# Patient Record
Sex: Female | Born: 1950 | Race: White | Hispanic: No | Smoking: Current every day smoker
Health system: Southern US, Community
[De-identification: ages and names within clinical notes are randomized; demographics above are authoritative.]

## PROBLEM LIST (undated history)

## (undated) DIAGNOSIS — F329 Major depressive disorder, single episode, unspecified: Secondary | ICD-10-CM

## (undated) DIAGNOSIS — M1990 Unspecified osteoarthritis, unspecified site: Secondary | ICD-10-CM

## (undated) DIAGNOSIS — E039 Hypothyroidism, unspecified: Secondary | ICD-10-CM

## (undated) DIAGNOSIS — R011 Cardiac murmur, unspecified: Secondary | ICD-10-CM

## (undated) DIAGNOSIS — E785 Hyperlipidemia, unspecified: Secondary | ICD-10-CM

## (undated) DIAGNOSIS — F32A Depression, unspecified: Secondary | ICD-10-CM

## (undated) DIAGNOSIS — E079 Disorder of thyroid, unspecified: Secondary | ICD-10-CM

## (undated) DIAGNOSIS — C4491 Basal cell carcinoma of skin, unspecified: Secondary | ICD-10-CM

## (undated) DIAGNOSIS — F419 Anxiety disorder, unspecified: Secondary | ICD-10-CM

## (undated) DIAGNOSIS — G709 Myoneural disorder, unspecified: Secondary | ICD-10-CM

## (undated) DIAGNOSIS — J449 Chronic obstructive pulmonary disease, unspecified: Secondary | ICD-10-CM

## (undated) DIAGNOSIS — C4492 Squamous cell carcinoma of skin, unspecified: Secondary | ICD-10-CM

## (undated) DIAGNOSIS — T7840XA Allergy, unspecified, initial encounter: Secondary | ICD-10-CM

## (undated) DIAGNOSIS — D039 Melanoma in situ, unspecified: Secondary | ICD-10-CM

## (undated) DIAGNOSIS — C539 Malignant neoplasm of cervix uteri, unspecified: Secondary | ICD-10-CM

## (undated) DIAGNOSIS — C439 Malignant melanoma of skin, unspecified: Secondary | ICD-10-CM

## (undated) HISTORY — PX: SHOULDER SURGERY: SHX246

## (undated) HISTORY — PX: CERVICAL CONIZATION W/BX: SHX1330

## (undated) HISTORY — DX: Basal cell carcinoma of skin, unspecified: C44.91

## (undated) HISTORY — PX: THUMB ARTHROSCOPY: SHX2509

## (undated) HISTORY — DX: Melanoma in situ, unspecified: D03.9

## (undated) HISTORY — DX: Allergy, unspecified, initial encounter: T78.40XA

## (undated) HISTORY — DX: Chronic obstructive pulmonary disease, unspecified: J44.9

## (undated) HISTORY — PX: EYE SURGERY: SHX253

## (undated) HISTORY — DX: Malignant melanoma of skin, unspecified: C43.9

## (undated) HISTORY — DX: Malignant neoplasm of cervix uteri, unspecified: C53.9

## (undated) HISTORY — DX: Depression, unspecified: F32.A

## (undated) HISTORY — DX: Squamous cell carcinoma of skin, unspecified: C44.92

## (undated) HISTORY — DX: Disorder of thyroid, unspecified: E07.9

## (undated) HISTORY — DX: Hyperlipidemia, unspecified: E78.5

## (undated) HISTORY — DX: Major depressive disorder, single episode, unspecified: F32.9

---

## 1997-09-13 ENCOUNTER — Other Ambulatory Visit: Admission: RE | Admit: 1997-09-13 | Discharge: 1997-09-13 | Payer: Self-pay | Admitting: Obstetrics and Gynecology

## 1998-04-06 HISTORY — PX: ANKLE SURGERY: SHX546

## 1999-05-28 ENCOUNTER — Encounter: Admission: RE | Admit: 1999-05-28 | Discharge: 1999-06-13 | Payer: Self-pay | Admitting: Podiatry

## 2000-02-12 ENCOUNTER — Encounter: Payer: Self-pay | Admitting: Obstetrics and Gynecology

## 2000-02-12 ENCOUNTER — Encounter: Admission: RE | Admit: 2000-02-12 | Discharge: 2000-02-12 | Payer: Self-pay | Admitting: Obstetrics and Gynecology

## 2001-02-17 ENCOUNTER — Encounter: Payer: Self-pay | Admitting: Obstetrics and Gynecology

## 2001-02-17 ENCOUNTER — Encounter: Admission: RE | Admit: 2001-02-17 | Discharge: 2001-02-17 | Payer: Self-pay | Admitting: Obstetrics and Gynecology

## 2003-02-19 ENCOUNTER — Encounter: Admission: RE | Admit: 2003-02-19 | Discharge: 2003-02-19 | Payer: Self-pay | Admitting: Obstetrics and Gynecology

## 2004-02-20 ENCOUNTER — Ambulatory Visit (HOSPITAL_COMMUNITY): Admission: RE | Admit: 2004-02-20 | Discharge: 2004-02-20 | Payer: Self-pay | Admitting: Obstetrics and Gynecology

## 2006-10-28 ENCOUNTER — Ambulatory Visit: Payer: Self-pay | Admitting: Internal Medicine

## 2006-10-28 LAB — CONVERTED CEMR LAB
Basophils Absolute: 0.1 10*3/uL (ref 0.0–0.1)
Basophils Relative: 0.9 % (ref 0.0–1.0)
Eosinophils Absolute: 0.1 10*3/uL (ref 0.0–0.6)
Eosinophils Relative: 1.8 % (ref 0.0–5.0)
HCT: 38.4 % (ref 36.0–46.0)
Hemoglobin: 13.6 g/dL (ref 12.0–15.0)
Lymphocytes Relative: 24.6 % (ref 12.0–46.0)
MCHC: 35.5 g/dL (ref 30.0–36.0)
MCV: 88.6 fL (ref 78.0–100.0)
Monocytes Absolute: 0.6 10*3/uL (ref 0.2–0.7)
Monocytes Relative: 6.9 % (ref 3.0–11.0)
Neutro Abs: 5.4 10*3/uL (ref 1.4–7.7)
Neutrophils Relative %: 65.8 % (ref 43.0–77.0)
Platelets: 219 10*3/uL (ref 150–400)
RBC: 4.34 M/uL (ref 3.87–5.11)
RDW: 12.4 % (ref 11.5–14.6)
Sed Rate: 7 mm/hr (ref 0–25)
WBC: 8.2 10*3/uL (ref 4.5–10.5)

## 2008-12-13 DIAGNOSIS — C4492 Squamous cell carcinoma of skin, unspecified: Secondary | ICD-10-CM

## 2008-12-13 HISTORY — DX: Squamous cell carcinoma of skin, unspecified: C44.92

## 2010-08-19 NOTE — Assessment & Plan Note (Signed)
[facility]                             PULMONARY OFFICE NOTE   RANIE, CHINCHILLA                     MRN:          [id number]  DATE:10/28/2006                            DOB:          1951/04/03    REASON FOR CONSULTATION:  Pulmonary nodules.   HISTORY:  This is a 60 year old white female with transient right sided  pleuritic pain lasting since a month ago that has now completely  resolved, but led to a chest x-ray which led to a CT scan suggesting  that she had interstitial lung disease with a few pulmonary nodules as  well. She is now seen at Dr. Kathi Der request for evaluation.   The patient says that she is 100% better now and denies any ongoing  dyspnea (in fact, she can work out aerobically 30 to 45 minutes 3 to 5  times per week). There is no recurrent chest pain, pleuritic,  exertional, or otherwise. There is no cough, fevers, chills, sweats,  dyspnea, or unintended weight loss. She does complain of some mild aches  and pains in her major joints, but no typical morning stiffness or gel  phenomena, or history of rheumatoidism to her knowledge.   PAST MEDICAL HISTORY:  Significant for a heart murmur, cervical cancer  approximately 20 years ago and a partial hysterectomy then. She did not  require any adjuvant therapy.   ALLERGIES:  None known.   MEDICATIONS:  Vitamins.   SOCIAL HISTORY:  She continues to smoke a pack per day. She works  Clinical cytogeneticist a store. She has worked in [employer] and [employer]  with inhaling a bunch of dust, but there is no history of any coal,  sandblasting, or asbestos exposure to her knowledge.   FAMILY HISTORY:  Significant in that her sister and mother who are both  smokers have chronic obstructive pulmonary disease.   REVIEW OF SYSTEMS:  Taken in detail in the worksheet and negative except  as outlined above.   PHYSICAL EXAMINATION:  GENERAL:  This is a healthy appearing ambulatory  middle aged  white female in no acute distress.  HEENT:  Unremarkable. She did have mildly hyperpigmented skin typical of  excess sun exposure, but no excess hyperpigmentation in non-sun exposed  area. Oropharynx was clear. She had full dentures in place. Nasal  turbinates normal with no crusting or discharge.  NECK:  Supple without cervical adenopathy or tenderness.  LUNGS:  Fields reveal a few pops and squeaks on inspiration with minimal  rhonchi on expiration.  HEART:  Regular rate and rhythm with no murmurs, rubs, or gallops.  ABDOMEN:  Soft and benign with negative Hoover's sign.  EXTREMITIES:  Warm without calf tenderness, cyanosis, clubbing, or  edema.   Chest x-ray from 09/10/2006 did not have any prior comparison studies  available, but there was mild diffuse interstitial change, which on CT  scan dated 10/13/2006 was nodular bilateral with no honey combing or  significant interstitial lung disease or granulomatous changes, although  there was a partially calcified plural plaque in the left upper lobe.   IMPRESSION:  1. Pleuritic chest pain  of unclear etiology has totally resolved and      probably was either musculoskeletal or related to a pneumonia that      resolved.  2. She has an abnormal exam with pops and squeaks typical of chronic      bronchitis/chronic obstructive pulmonary disease, but minimum      symptoms.  3. She has bilateral nodular interstitial lung disease which may      represent an early form of cigarette induced disease such as      eosinophilic granulomatosis  or respiratory bronchiolitis      interstitial disease. An outside possibility is also that she is      developing MAI, but notice the absence of cough rules against this.   I am surprised that her exercise tolerance is so well preserved and I  suspect that she will have significant airflow obstruction on PFTs. I  have asked her to return for a set of PFTs and at that point, we will  review her plain film,  obtain another for baseline purposes, and then  line her up for serial follow up.   I had a very frank discussion with this patient when she asked, what  about the spots on my lungs. I do not believe that the spots on the  lungs are any more significant to her than a leaky faucet would have  been on the Titanic should she continue to smoke. In other words, she  needs to face the fact that the main issue facing her is whether she is  going to change course now to avoid significant long term morbidity or  mortality from smoking, or stop smoking now and literally let the  smoke clear, so that we can get a better idea of long lung function  and also address the issue of the nodules, which may be in fact,  directly related to smoking or present alternative diagnoses none of  which come to mind in terms the need for a earlier or immediate  pulmonary intervention such as biopsy in this case.   I know that she has already heard this message from Dr. Christell Constant and  apparently she has a prescription for Chantix, but has not yet decided  to fill it. I have strongly encouraged her to do so.     Charlaine Dalton. Sherene Sires, MD, [facility]  Electronically Signed    MBW/MedQ  DD: 10/28/2006  DT: 10/29/2006  Job #: [id number]   cc:   Ernestina Penna, M.D.

## 2011-08-06 ENCOUNTER — Other Ambulatory Visit: Payer: Self-pay

## 2012-04-06 HISTORY — PX: COLONOSCOPY: SHX174

## 2013-10-09 ENCOUNTER — Ambulatory Visit (INDEPENDENT_AMBULATORY_CARE_PROVIDER_SITE_OTHER): Payer: BC Managed Care – PPO | Admitting: Obstetrics & Gynecology

## 2013-10-09 ENCOUNTER — Encounter: Payer: Self-pay | Admitting: Obstetrics & Gynecology

## 2013-10-09 VITALS — BP 141/86 | HR 70 | Resp 16 | Ht 65.0 in | Wt 130.0 lb

## 2013-10-09 DIAGNOSIS — Z124 Encounter for screening for malignant neoplasm of cervix: Secondary | ICD-10-CM

## 2013-10-09 DIAGNOSIS — Z01419 Encounter for gynecological examination (general) (routine) without abnormal findings: Secondary | ICD-10-CM

## 2013-10-09 DIAGNOSIS — Z780 Asymptomatic menopausal state: Secondary | ICD-10-CM

## 2013-10-09 DIAGNOSIS — Z87410 Personal history of cervical dysplasia: Secondary | ICD-10-CM

## 2013-10-09 DIAGNOSIS — Z1151 Encounter for screening for human papillomavirus (HPV): Secondary | ICD-10-CM

## 2013-10-09 NOTE — Progress Notes (Signed)
Patient ID: Carolyn [name], female   DOB: April 05, 1951, 63 y.o.   MRN: [id number]  Chief Complaint  Patient presents with  . Gynecologic Exam    HPI Carolyn [name] is a 63 y.o. female.  [id number] No LMP recorded. Patient is postmenopausal. Last pap was 1 year ago, normal. Has mammogram appt.  HPI  Past Medical History  Diagnosis Date  . Cervical cancer   . Thyroid disease     Past Surgical History  Procedure Laterality Date  . Cervical conization w/bx    1989  Family History  Problem Relation Age of Onset  . Heart disease Mother   . Aneurysm Mother   . Heart disease Father   . Kidney failure Father     Social History History  Substance Use Topics  . Smoking status: Current Every Day Smoker  . Smokeless tobacco: Never Used  . Alcohol Use: No    Allergies  Allergen Reactions  . Azithromycin Rash    Current Outpatient Prescriptions  Medication Sig Dispense Refill  . Calcium-Magnesium-Zinc 167-83-8 MG TABS Take 2 tablets by mouth daily.      . clonazePAM (KLONOPIN) 0.5 MG tablet       . levothyroxine (SYNTHROID, LEVOTHROID) 50 MCG tablet Take 50 mcg by mouth daily before breakfast.      . Multiple Vitamins-Minerals (CENTRUM SILVER ADULT 50+ PO) Take 1 tablet by mouth daily.      . Omega-3 Fatty Acids (FISH OIL) 1200 MG CAPS Take 2 capsules by mouth daily.      . potassium gluconate 595 MG TABS tablet Take 595 mg by mouth 2 (two) times daily.      . prenatal vitamin w/FE, FA (PRENATAL 1 + 1) 27-1 MG TABS tablet Take 1 tablet by mouth daily at 12 noon.       No current facility-administered medications for this visit.    Review of Systems Review of Systems  Constitutional: Negative.   Respiratory: Negative.   Genitourinary: Positive for urgency. Negative for vaginal bleeding, vaginal discharge and pelvic pain.    Blood pressure 141/86, pulse 70, resp. rate 16, height 5\' 5"  (1.651 m), weight 130 lb (58.968 kg).  Physical Exam Physical Exam  Constitutional:  She is oriented to person, place, and time. She appears well-developed. No distress.  Cardiovascular: Normal rate.   Pulmonary/Chest: Effort normal. She has wheezes (scattered).  Breasts: breasts appear normal, no suspicious masses, no skin or nipple changes or axillary nodes.   Abdominal: Soft. She exhibits no distension. There is no tenderness.  Genitourinary: Vagina normal and uterus normal. No vaginal discharge found.  Pelvic exam: normal external genitalia, vulva, vagina, cervix, uterus and adnexa pap done.   Neurological: She is alert and oriented to person, place, and time.  Skin: Skin is warm and dry.  Whole body tan, dry skin, significant aging  Psychiatric: She has a normal mood and affect. Her behavior is normal.    Data Reviewed Meds, notes  Assessment    Postmenopausal, h/o cx dysplasia, tanning, smoker     Plan    Quit smoking, tanning, have colonoscopy, RTC 1 year        Chauncy Mangiaracina 10/09/2013, 3:03 PM

## 2013-10-12 LAB — CYTOLOGY - PAP

## 2013-10-23 ENCOUNTER — Telehealth: Payer: Self-pay

## 2013-10-23 NOTE — Telephone Encounter (Signed)
Colpo scheduled for 11/06/13 at 1445. Attempted to call patient to inform of results. No answer. Phone continued to ring-- unable to leave message.

## 2013-10-23 NOTE — Telephone Encounter (Signed)
Message copied by Geanie Logan on Mon Oct 23, 2013  8:20 AM ------      Message from: Woodroe Mode      Created: Fri Oct 20, 2013  2:51 PM       Please schedule colposcopy ------

## 2013-10-24 NOTE — Telephone Encounter (Signed)
Called patient and informed her of results as well as colposcopy date and time. Patient reports she wants to put it off as her mom just passed away. Informed patient that it is not urgent, however, it is something that should be done, especially given her history in the past. Patient verbalized understanding and stated she will keep appointment. Informed patient that though her pap was in [place] will be done here at [facility] hospital clinic-- gave her address as well as clinic number should she need to reschedule. Patient verbalized understanding and gratitude. No further questions or concerns.

## 2013-11-06 ENCOUNTER — Other Ambulatory Visit (HOSPITAL_COMMUNITY)
Admission: RE | Admit: 2013-11-06 | Discharge: 2013-11-06 | Disposition: A | Discharge status: Home / Self Care | Payer: BC Managed Care – PPO | Source: Ambulatory Visit | Attending: Obstetrics & Gynecology | Admitting: Obstetrics & Gynecology

## 2013-11-06 ENCOUNTER — Encounter: Payer: Self-pay | Admitting: Obstetrics & Gynecology

## 2013-11-06 ENCOUNTER — Ambulatory Visit (INDEPENDENT_AMBULATORY_CARE_PROVIDER_SITE_OTHER): Payer: BC Managed Care – PPO | Admitting: Obstetrics & Gynecology

## 2013-11-06 VITALS — BP 133/73 | HR 78 | Temp 97.7°F | Ht 66.0 in | Wt 126.9 lb

## 2013-11-06 DIAGNOSIS — R87619 Unspecified abnormal cytological findings in specimens from cervix uteri: Secondary | ICD-10-CM | POA: Insufficient documentation

## 2013-11-06 DIAGNOSIS — N942 Vaginismus: Secondary | ICD-10-CM | POA: Diagnosis not present

## 2013-11-06 LAB — POCT PREGNANCY, URINE: Preg Test, Ur: NEGATIVE

## 2013-11-06 NOTE — Progress Notes (Signed)
  Colposcopy Procedure Note  Indications: Pap smear 1 months ago showed: glandular cell abnormality (AGUS). The prior pap showed no abnormalities (per patient).  Prior cervical/vaginal disease: Patient has history of cervical cone (?CKC vs LEEP) by Dr. Ulanda Edison when patient was 63 yo.  Attempting to get records. Pt denies any post menopausal bleeding.  Procedure Details  The risks and benefits of the procedure and Written informed consent obtained.  Speculum placed in vagina and excellent visualization of cervix achieved, cervix swabbed x 3 with acetic acid solution.  Findings: Cervix: no visible lesions; cervix swabbed with  Acetic acid and subsequently Lugol's solution, endocervical speculum placed and endocervical curettage performed.  There was no visible lesion and the Pennock junction could not be seen with the endocervical speculum. Vaginal inspection: vaginal colposcopy not performed. Vulvar colposcopy: vulvar colposcopy not performed.  ENDOMETRIAL BIOPSY     The indications for endometrial biopsy were reviewed.   Risks of the biopsy including cramping, bleeding, infection, uterine perforation, inadequate specimen and need for additional procedures  were discussed. The patient states she understands and agrees to undergo procedure today. Consent was signed. Time out was performed. Urine HCG was negative. A sterile speculum was placed in the patient's vagina and the cervix was prepped with Betadine. A single-toothed tenaculum was placed on the anterior lip of the cervix to stabilize it. The 3 mm pipelle was introduced into the endometrial cavity without difficulty to a depth of 7cm, and a moderate amount of tissue was obtained and sent to pathology. The instruments were removed from the patient's vagina. Minimal bleeding from the cervix was noted. The patient tolerated the procedure well. Routine post-procedure instructions were given to the patient. The patient will follow up to review the results  and for further management.     Specimens: ECC and endometrial biopsy.  Complications: none.  Plan: Specimens labelled and sent to Pathology. Will base further treatment on Pathology findings. Post biopsy instructions given to patient. Return to discuss Pathology results in 2 weeks.

## 2013-11-10 ENCOUNTER — Telehealth: Payer: Self-pay

## 2013-11-10 ENCOUNTER — Encounter: Payer: Self-pay | Admitting: *Deleted

## 2013-11-10 ENCOUNTER — Encounter: Payer: Self-pay | Admitting: Obstetrics & Gynecology

## 2013-11-10 NOTE — Telephone Encounter (Signed)
Message copied by Geanie Logan on Fri Nov 10, 2013 10:42 AM ------      Message from: Guss Bunde      Created: Fri Nov 10, 2013 10:31 AM       Inadequate colpo.  No dysplasia seen on exam and ECC is benign.  Endometrial biopsy negative.  Pt needs excisional procedure and is scheduled for 11/29/13 ------

## 2013-11-10 NOTE — Telephone Encounter (Signed)
Inadequate colposcopy. Hx of AGUS. Needs LEEP. Appointment 11/29/13. Called patient and informed of results and the need to have LEEP. Patient questions reason for LEEP given negative ECC and endometrial biopsy. Explained the ECC is tissue within the cervix, the endometrial biopsy is endometrial tissue lining the uterus and that the colpo biopsies are tissue on the outside of the cervix. Explained the importance of getting adequate tissue there to sample as you want to ensure all areas are accounted for. Informed patient that they will discuss her results and perform LEEP with her consent at next appointment on 11/29/13. Patient verbalized understanding. No further questions or concerns.

## 2013-11-29 ENCOUNTER — Ambulatory Visit (INDEPENDENT_AMBULATORY_CARE_PROVIDER_SITE_OTHER): Payer: BC Managed Care – PPO | Admitting: Obstetrics and Gynecology

## 2013-11-29 ENCOUNTER — Encounter: Payer: Self-pay | Admitting: Obstetrics and Gynecology

## 2013-11-29 ENCOUNTER — Other Ambulatory Visit (HOSPITAL_COMMUNITY)
Admission: RE | Admit: 2013-11-29 | Discharge: 2013-11-29 | Disposition: A | Discharge status: Home / Self Care | Payer: BC Managed Care – PPO | Source: Ambulatory Visit | Attending: Obstetrics and Gynecology | Admitting: Obstetrics and Gynecology

## 2013-11-29 VITALS — BP 145/78 | HR 67 | Temp 97.5°F | Ht 66.0 in | Wt 128.4 lb

## 2013-11-29 DIAGNOSIS — R87619 Unspecified abnormal cytological findings in specimens from cervix uteri: Secondary | ICD-10-CM | POA: Insufficient documentation

## 2013-11-29 NOTE — Addendum Note (Signed)
Addended by: Rutherford Nail E on: 11/29/2013 02:32 PM   Modules accepted: Orders

## 2013-11-29 NOTE — Progress Notes (Signed)
Patient ID: Carolyn [name], female   DOB: 1950-10-17, 63 y.o.   MRN: [id number] Patient identified, informed consent obtained, signed copy in chart, time out performed.  Pap smear and colposcopy reviewed.   Pap AGUS +HPV Colpo Biopsy- n/a ECC benign but inadequent sample Endometrial biopsy- benign for hyperplasia or malignancy Teflon coated speculum with smoke evacuator placed.  Cervix visualized. Paracervical block placed.  medium size LOOP used to remove cone of cervix using blend of cut and cautery on LEEP machine.  Edges/Base cauterized with Ball.  Monsel's solution used for hemostasis.  Patient tolerated procedure well.  Patient given post procedure instructions.  Follow up in 6 months for repeat pap or as needed.

## 2013-12-01 ENCOUNTER — Encounter: Payer: Self-pay | Admitting: General Practice

## 2013-12-19 ENCOUNTER — Telehealth: Payer: Self-pay

## 2013-12-19 NOTE — Telephone Encounter (Signed)
Message copied by Geanie Logan on Tue Dec 19, 2013 10:32 AM ------      Message from: Mora Bellman      Created: Tue Dec 19, 2013  9:13 AM       Please inform patient that Leep procedure appears to be curative. Please ensure patient knows to return in 6 months for repeat pap smear ------

## 2013-12-19 NOTE — Telephone Encounter (Signed)
Called patient and informed her of LEEP results and recommendations. Advised that she call in January to schedule repeat 6 month pap for sometime in February- beginning of March. Patient verbalized understanding and gratitude. No questions or concerns.

## 2014-02-05 ENCOUNTER — Encounter: Payer: Self-pay | Admitting: Obstetrics and Gynecology

## 2014-07-05 ENCOUNTER — Other Ambulatory Visit: Payer: Self-pay | Admitting: Dermatology

## 2014-07-05 DIAGNOSIS — D039 Melanoma in situ, unspecified: Secondary | ICD-10-CM

## 2014-07-05 HISTORY — DX: Melanoma in situ, unspecified: D03.9

## 2014-07-20 ENCOUNTER — Ambulatory Visit (INDEPENDENT_AMBULATORY_CARE_PROVIDER_SITE_OTHER): Payer: BLUE CROSS/BLUE SHIELD | Admitting: Obstetrics and Gynecology

## 2014-07-20 ENCOUNTER — Encounter: Payer: Self-pay | Admitting: Obstetrics and Gynecology

## 2014-07-20 VITALS — BP 142/81 | HR 64 | Temp 97.6°F | Ht 64.0 in | Wt 139.5 lb

## 2014-07-20 DIAGNOSIS — Z124 Encounter for screening for malignant neoplasm of cervix: Secondary | ICD-10-CM | POA: Diagnosis not present

## 2014-07-20 DIAGNOSIS — R87619 Unspecified abnormal cytological findings in specimens from cervix uteri: Secondary | ICD-10-CM

## 2014-07-20 DIAGNOSIS — Z1151 Encounter for screening for human papillomavirus (HPV): Secondary | ICD-10-CM | POA: Diagnosis not present

## 2014-07-20 NOTE — Progress Notes (Signed)
Patient ID: Carolyn [name], female   DOB: 1951-01-11, 64 y.o.   MRN: [id number] 64 yo with h/o AGUS pap followed by a LEEP in 11/2013 who is here for follow up pap smear. Patient is without complaints.   Pap smear collected  Patient will be contacted with any abnormal results and appropriate management plan

## 2014-07-23 LAB — CYTOLOGY - PAP

## 2014-07-27 ENCOUNTER — Telehealth: Payer: Self-pay

## 2014-07-27 NOTE — Telephone Encounter (Signed)
-----   Message from Mora Bellman, MD sent at 07/27/2014  7:28 AM EDT ----- Please inform patient of abnormal pap smear and need for colpo  Thanks  Peggy

## 2014-07-27 NOTE — Telephone Encounter (Signed)
Called pt and LM to return the call to the clinics.

## 2014-07-30 NOTE — Telephone Encounter (Signed)
Patient returned our call. I called patient back and left voicemail. Message sent to front desk to schedule colposcopy.

## 2014-07-30 NOTE — Telephone Encounter (Signed)
Called patient and informed her of need for colpo. Advised her that when appointment is made we will call her to inform her of the appointment date and time. Patient had no further questions.

## 2014-08-05 HISTORY — PX: MELANOMA EXCISION: SHX5266

## 2014-08-08 ENCOUNTER — Encounter: Payer: BLUE CROSS/BLUE SHIELD | Admitting: Obstetrics & Gynecology

## 2014-08-15 ENCOUNTER — Other Ambulatory Visit (HOSPITAL_COMMUNITY)
Admission: RE | Admit: 2014-08-15 | Discharge: 2014-08-15 | Disposition: A | Discharge status: Home / Self Care | Payer: BLUE CROSS/BLUE SHIELD | Source: Ambulatory Visit | Attending: Obstetrics & Gynecology | Admitting: Obstetrics & Gynecology

## 2014-08-15 ENCOUNTER — Ambulatory Visit (INDEPENDENT_AMBULATORY_CARE_PROVIDER_SITE_OTHER): Payer: BLUE CROSS/BLUE SHIELD | Admitting: Obstetrics and Gynecology

## 2014-08-15 ENCOUNTER — Encounter: Payer: Self-pay | Admitting: Obstetrics and Gynecology

## 2014-08-15 VITALS — BP 141/73 | HR 65 | Temp 98.2°F | Ht 64.0 in | Wt 137.8 lb

## 2014-08-15 DIAGNOSIS — R87619 Unspecified abnormal cytological findings in specimens from cervix uteri: Secondary | ICD-10-CM | POA: Insufficient documentation

## 2014-08-15 DIAGNOSIS — R87612 Low grade squamous intraepithelial lesion on cytologic smear of cervix (LGSIL): Secondary | ICD-10-CM | POA: Diagnosis not present

## 2014-08-15 NOTE — Progress Notes (Signed)
Patient ID: Carolyn [name], female   DOB: 1950-11-23, 64 y.o.   MRN: [id number]  Patient s/p LEEP 11/2013 with 6 month follow up pap smear LSIL here for colposcopy  Patient given informed consent, signed copy in the chart, time out was performed.  Placed in lithotomy position. Cervix viewed with speculum and colposcope after application of acetic acid.   Colposcopy adequate?  No, TZ not visualized. Very atrophic vagina and cervix. Cervix almost flush with vaginal vault Acetowhite lesions?faint at 7 o'clock Punctation?no Mosaicism?  no Abnormal vasculature?  no Biopsies?yes 7o'clock ECC?yes  COMMENTS: Patient was given post procedure instructions.  She will return in 2 weeks for results.  Mora Bellman, MD

## 2014-08-17 ENCOUNTER — Telehealth: Payer: Self-pay

## 2014-08-17 NOTE — Telephone Encounter (Signed)
-----   Message from Mora Bellman, MD sent at 08/17/2014  8:32 AM EDT ----- Please inform patient that she should follow up in 6 months for repeat pap smear  Thanks  Peggy   ----- Message -----    From: Osborne Oman, MD    Sent: 08/16/2014   5:30 PM      To: Mora Bellman, MD    ----- Message -----    From: [facility]    Sent: 08/16/2014   4:51 PM      To: Osborne Oman, MD

## 2014-08-17 NOTE — Telephone Encounter (Signed)
Called patient and informed her of results and recommendations. Patient verbalized understanding and gratitude and asked how much longer she will bleed-- asked patient if she was still bleeding-- patient states she is spotting with brownish discharge. Informed patient this was normal and due to the solution placed on cervix after biopsy. Informed her discharge to be expected for a few more days. Patient verbalized understanding and gratitude. No further questions or concerns.

## 2014-09-13 ENCOUNTER — Other Ambulatory Visit: Payer: Self-pay | Admitting: Dermatology

## 2014-10-16 LAB — HM DEXA SCAN

## 2014-11-01 ENCOUNTER — Other Ambulatory Visit: Payer: Self-pay | Admitting: Dermatology

## 2014-11-29 ENCOUNTER — Telehealth: Payer: Self-pay | Admitting: General Practice

## 2014-11-29 NOTE — Telephone Encounter (Signed)
Patient called and left message stating she is returning our call. Called patient back stating I am returning her call. Told patient I am unsure who tried to contact her as I do not see a telephone call from one of our nurses listed in the system. Told patient if there wasn't a message left with a direct number the person that needs her will probably call her back. Patient verbalized understanding and had no other questions. After phone call, spoke with Beronica in front office who states she called the patient yesterday. beronica will return phone call

## 2015-01-11 ENCOUNTER — Encounter: Payer: Self-pay | Admitting: Obstetrics and Gynecology

## 2015-01-11 ENCOUNTER — Ambulatory Visit (INDEPENDENT_AMBULATORY_CARE_PROVIDER_SITE_OTHER): Payer: BLUE CROSS/BLUE SHIELD | Admitting: Obstetrics and Gynecology

## 2015-01-11 VITALS — BP 131/69 | HR 74 | Temp 98.2°F | Ht 64.0 in | Wt 143.4 lb

## 2015-01-11 DIAGNOSIS — Z1151 Encounter for screening for human papillomavirus (HPV): Secondary | ICD-10-CM

## 2015-01-11 DIAGNOSIS — R87612 Low grade squamous intraepithelial lesion on cytologic smear of cervix (LGSIL): Secondary | ICD-10-CM | POA: Diagnosis not present

## 2015-01-11 DIAGNOSIS — Z124 Encounter for screening for malignant neoplasm of cervix: Secondary | ICD-10-CM | POA: Diagnosis not present

## 2015-01-11 NOTE — Progress Notes (Signed)
Patient ID: Carolyn [name], female   DOB: 07-Aug-1950, 64 y.o.   MRN: [id number] 64 yo with h/o abnormal pap smear here for 6- month follow up  08/15/2014 colpo CIN I  Repeat pap smear performed today  Patient will be contacted with any abnormal results

## 2015-01-14 LAB — CYTOLOGY - PAP

## 2015-01-15 ENCOUNTER — Telehealth: Payer: Self-pay | Admitting: *Deleted

## 2015-01-15 NOTE — Telephone Encounter (Signed)
Called patient per Dr Elly Modena re: abnormal PAP, need for colposcopy. No answer, voice mail left to call the clinic.

## 2015-01-15 NOTE — Telephone Encounter (Signed)
Contacted patient, results given.  Pt states she cannot do colposcopy at this time, is having shoulder surgery. Will schedule 8 wks out.  Message to front office.

## 2015-03-04 ENCOUNTER — Ambulatory Visit (INDEPENDENT_AMBULATORY_CARE_PROVIDER_SITE_OTHER): Payer: BLUE CROSS/BLUE SHIELD | Admitting: Obstetrics and Gynecology

## 2015-03-04 ENCOUNTER — Other Ambulatory Visit (HOSPITAL_COMMUNITY)
Admission: RE | Admit: 2015-03-04 | Discharge: 2015-03-04 | Disposition: A | Discharge status: Home / Self Care | Payer: BLUE CROSS/BLUE SHIELD | Source: Ambulatory Visit | Attending: Obstetrics and Gynecology | Admitting: Obstetrics and Gynecology

## 2015-03-04 ENCOUNTER — Encounter: Payer: Self-pay | Admitting: Obstetrics and Gynecology

## 2015-03-04 VITALS — BP 120/54 | HR 74 | Ht 64.0 in | Wt 146.4 lb

## 2015-03-04 DIAGNOSIS — R896 Abnormal cytological findings in specimens from other organs, systems and tissues: Secondary | ICD-10-CM

## 2015-03-04 DIAGNOSIS — R87619 Unspecified abnormal cytological findings in specimens from cervix uteri: Secondary | ICD-10-CM | POA: Diagnosis present

## 2015-03-04 DIAGNOSIS — IMO0002 Reserved for concepts with insufficient information to code with codable children: Secondary | ICD-10-CM | POA: Insufficient documentation

## 2015-03-04 LAB — POCT PREGNANCY, URINE: Preg Test, Ur: NEGATIVE

## 2015-03-04 NOTE — Progress Notes (Signed)
Patient ID: Carolyn [name], female   DOB: [birth year], 64 y.o.   MRN: [id number] ASCUS +HPV in 01/2015 Patient given informed consent, signed copy in the chart, time out was performed.  Placed in lithotomy position. Cervix viewed with speculum and colposcope after application of acetic acid.   Colposcopy adequate?  No TZ not visualized Acetowhite lesions?yes 7 o'clock Punctation?no Mosaicism?  no Abnormal vasculature?  no Biopsies?yes 7 o'clock ECC?yes  COMMENTS: Patient was given post procedure instructions.  She will return in 2 weeks for results.  Mora Bellman, MD

## 2015-03-11 ENCOUNTER — Encounter: Payer: Self-pay | Admitting: *Deleted

## 2015-07-11 ENCOUNTER — Ambulatory Visit (INDEPENDENT_AMBULATORY_CARE_PROVIDER_SITE_OTHER): Payer: Medicare Other | Admitting: Family

## 2015-07-11 ENCOUNTER — Encounter: Payer: Self-pay | Admitting: Family

## 2015-07-11 VITALS — BP 125/84 | HR 66 | Temp 97.1°F | Ht 64.0 in | Wt 142.8 lb

## 2015-07-11 DIAGNOSIS — F411 Generalized anxiety disorder: Secondary | ICD-10-CM

## 2015-07-11 DIAGNOSIS — Z1159 Encounter for screening for other viral diseases: Secondary | ICD-10-CM | POA: Diagnosis not present

## 2015-07-11 DIAGNOSIS — E039 Hypothyroidism, unspecified: Secondary | ICD-10-CM | POA: Diagnosis not present

## 2015-07-11 DIAGNOSIS — J309 Allergic rhinitis, unspecified: Secondary | ICD-10-CM | POA: Insufficient documentation

## 2015-07-11 DIAGNOSIS — F329 Major depressive disorder, single episode, unspecified: Secondary | ICD-10-CM | POA: Diagnosis not present

## 2015-07-11 DIAGNOSIS — Z114 Encounter for screening for human immunodeficiency virus [HIV]: Secondary | ICD-10-CM

## 2015-07-11 DIAGNOSIS — Z1211 Encounter for screening for malignant neoplasm of colon: Secondary | ICD-10-CM

## 2015-07-11 DIAGNOSIS — G2581 Restless legs syndrome: Secondary | ICD-10-CM | POA: Diagnosis not present

## 2015-07-11 DIAGNOSIS — F32A Depression, unspecified: Secondary | ICD-10-CM

## 2015-07-11 DIAGNOSIS — F3341 Major depressive disorder, recurrent, in partial remission: Secondary | ICD-10-CM | POA: Insufficient documentation

## 2015-07-11 MED ORDER — CITALOPRAM HYDROBROMIDE 20 MG PO TABS: 20.0000 mg | ORAL_TABLET | Freq: Every day | ORAL | Status: DC

## 2015-07-11 MED ORDER — CLONAZEPAM 0.5 MG PO TABS: 0.5000 mg | ORAL_TABLET | Freq: Every day | ORAL | Status: DC

## 2015-07-11 NOTE — Patient Instructions (Signed)
Health Maintenance, Female Adopting a healthy lifestyle and getting preventive care can go a long way to promote health and wellness. Talk with your health care provider about what schedule of regular examinations is right for you. This is a good chance for you to check in with your provider about disease prevention and staying healthy. In between checkups, there are plenty of things you can do on your own. Experts have done a lot of research about which lifestyle changes and preventive measures are most likely to keep you healthy. Ask your health care provider for more information. WEIGHT AND DIET  Eat a healthy diet  Be sure to include plenty of vegetables, fruits, low-fat dairy products, and lean protein.  Do not eat a lot of foods high in solid fats, added sugars, or salt.  Get regular exercise. This is one of the most important things you can do for your health.  Most adults should exercise for at least 150 minutes each week. The exercise should increase your heart rate and make you sweat (moderate-intensity exercise).  Most adults should also do strengthening exercises at least twice a week. This is in addition to the moderate-intensity exercise.  Maintain a healthy weight  Body mass index (BMI) is a measurement that can be used to identify possible weight problems. It estimates body fat based on height and weight. Your health care provider can help determine your BMI and help you achieve or maintain a healthy weight.  For females 20 years of age and older:   A BMI below 18.5 is considered underweight.  A BMI of 18.5 to 24.9 is normal.  A BMI of 25 to 29.9 is considered overweight.  A BMI of 30 and above is considered obese.  Watch levels of cholesterol and blood lipids  You should start having your blood tested for lipids and cholesterol at 63 years of age, then have this test every 5 years.  You may need to have your cholesterol levels checked more often if:  Your lipid  or cholesterol levels are high.  You are older than 63 years of age.  You are at high risk for heart disease.  CANCER SCREENING   Lung Cancer  Lung cancer screening is recommended for adults 55-80 years old who are at high risk for lung cancer because of a history of smoking.  A yearly low-dose CT scan of the lungs is recommended for people who:  Currently smoke.  Have quit within the past 15 years.  Have at least a 30-pack-year history of smoking. A pack year is smoking an average of one pack of cigarettes a day for 1 year.  Yearly screening should continue until it has been 15 years since you quit.  Yearly screening should stop if you develop a health problem that would prevent you from having lung cancer treatment.  Breast Cancer  Practice breast self-awareness. This means understanding how your breasts normally appear and feel.  It also means doing regular breast self-exams. Let your health care provider know about any changes, no matter how small.  If you are in your 20s or 30s, you should have a clinical breast exam (CBE) by a health care provider every 1-3 years as part of a regular health exam.  If you are 40 or older, have a CBE every year. Also consider having a breast X-ray (mammogram) every year.  If you have a family history of breast cancer, talk to your health care provider about genetic screening.  If you   are at high risk for breast cancer, talk to your health care provider about having an MRI and a mammogram every year.  Breast cancer gene (BRCA) assessment is recommended for women who have family members with BRCA-related cancers. BRCA-related cancers include:  Breast.  Ovarian.  Tubal.  Peritoneal cancers.  Results of the assessment will determine the need for genetic counseling and BRCA1 and BRCA2 testing. Cervical Cancer Your health care provider may recommend that you be screened regularly for cancer of the pelvic organs (ovaries, uterus, and  vagina). This screening involves a pelvic examination, including checking for microscopic changes to the surface of your cervix (Pap test). You may be encouraged to have this screening done every 3 years, beginning at age 21.  For women ages 30-65, health care providers may recommend pelvic exams and Pap testing every 3 years, or they may recommend the Pap and pelvic exam, combined with testing for human papilloma virus (HPV), every 5 years. Some types of HPV increase your risk of cervical cancer. Testing for HPV may also be done on women of any age with unclear Pap test results.  Other health care providers may not recommend any screening for nonpregnant women who are considered low risk for pelvic cancer and who do not have symptoms. Ask your health care provider if a screening pelvic exam is right for you.  If you have had past treatment for cervical cancer or a condition that could lead to cancer, you need Pap tests and screening for cancer for at least 20 years after your treatment. If Pap tests have been discontinued, your risk factors (such as having a new sexual partner) need to be reassessed to determine if screening should resume. Some women have medical problems that increase the chance of getting cervical cancer. In these cases, your health care provider may recommend more frequent screening and Pap tests. Colorectal Cancer  This type of cancer can be detected and often prevented.  Routine colorectal cancer screening usually begins at 63 years of age and continues through 63 years of age.  Your health care provider may recommend screening at an earlier age if you have risk factors for colon cancer.  Your health care provider may also recommend using home test kits to check for hidden blood in the stool.  A small camera at the end of a tube can be used to examine your colon directly (sigmoidoscopy or colonoscopy). This is done to check for the earliest forms of colorectal  cancer.  Routine screening usually begins at age 50.  Direct examination of the colon should be repeated every 5-10 years through 63 years of age. However, you may need to be screened more often if early forms of precancerous polyps or small growths are found. Skin Cancer  Check your skin from head to toe regularly.  Tell your health care provider about any new moles or changes in moles, especially if there is a change in a mole's shape or color.  Also tell your health care provider if you have a mole that is larger than the size of a pencil eraser.  Always use sunscreen. Apply sunscreen liberally and repeatedly throughout the day.  Protect yourself by wearing long sleeves, pants, a wide-brimmed hat, and sunglasses whenever you are outside. HEART DISEASE, DIABETES, AND HIGH BLOOD PRESSURE   High blood pressure causes heart disease and increases the risk of stroke. High blood pressure is more likely to develop in:  People who have blood pressure in the high end   of the normal range (130-139/85-89 mm Hg).  People who are overweight or obese.  People who are African American.  If you are 38-23 years of age, have your blood pressure checked every 3-5 years. If you are 61 years of age or older, have your blood pressure checked every year. You should have your blood pressure measured twice--once when you are at a hospital or clinic, and once when you are not at a hospital or clinic. Record the average of the two measurements. To check your blood pressure when you are not at a hospital or clinic, you can use:  An automated blood pressure machine at a pharmacy.  A home blood pressure monitor.  If you are between 45 years and 39 years old, ask your health care provider if you should take aspirin to prevent strokes.  Have regular diabetes screenings. This involves taking a blood sample to check your fasting blood sugar level.  If you are at a normal weight and have a low risk for diabetes,  have this test once every three years after 63 years of age.  If you are overweight and have a high risk for diabetes, consider being tested at a younger age or more often. PREVENTING INFECTION  Hepatitis B  If you have a higher risk for hepatitis B, you should be screened for this virus. You are considered at high risk for hepatitis B if:  You were born in a country where hepatitis B is common. Ask your health care provider which countries are considered high risk.  Your parents were born in a high-risk country, and you have not been immunized against hepatitis B (hepatitis B vaccine).  You have HIV or AIDS.  You use needles to inject street drugs.  You live with someone who has hepatitis B.  You have had sex with someone who has hepatitis B.  You get hemodialysis treatment.  You take certain medicines for conditions, including cancer, organ transplantation, and autoimmune conditions. Hepatitis C  Blood testing is recommended for:  Everyone born from [birth year] through 1963.  Anyone with known risk factors for hepatitis C. Sexually transmitted infections (STIs)  You should be screened for sexually transmitted infections (STIs) including gonorrhea and chlamydia if:  You are sexually active and are younger than 63 years of age.  You are older than 63 years of age and your health care provider tells you that you are at risk for this type of infection.  Your sexual activity has changed since you were last screened and you are at an increased risk for chlamydia or gonorrhea. Ask your health care provider if you are at risk.  If you do not have HIV, but are at risk, it may be recommended that you take a prescription medicine daily to prevent HIV infection. This is called pre-exposure prophylaxis (PrEP). You are considered at risk if:  You are sexually active and do not regularly use condoms or know the HIV status of your partner(s).  You take drugs by injection.  You are sexually  active with a partner who has HIV. Talk with your health care provider about whether you are at high risk of being infected with HIV. If you choose to begin PrEP, you should first be tested for HIV. You should then be tested every 3 months for as long as you are taking PrEP.  PREGNANCY   If you are premenopausal and you may become pregnant, ask your health care provider about preconception counseling.  If you may  become pregnant, take 400 to 800 micrograms (mcg) of folic acid every day.  If you want to prevent pregnancy, talk to your health care provider about birth control (contraception). OSTEOPOROSIS AND MENOPAUSE   Osteoporosis is a disease in which the bones lose minerals and strength with aging. This can result in serious bone fractures. Your risk for osteoporosis can be identified using a bone density scan.  If you are 61 years of age or older, or if you are at risk for osteoporosis and fractures, ask your health care provider if you should be screened.  Ask your health care provider whether you should take a calcium or vitamin D supplement to lower your risk for osteoporosis.  Menopause may have certain physical symptoms and risks.  Hormone replacement therapy may reduce some of these symptoms and risks. Talk to your health care provider about whether hormone replacement therapy is right for you.  HOME CARE INSTRUCTIONS   Schedule regular health, dental, and eye exams.  Stay current with your immunizations.   Do not use any tobacco products including cigarettes, chewing tobacco, or electronic cigarettes.  If you are pregnant, do not drink alcohol.  If you are breastfeeding, limit how much and how often you drink alcohol.  Limit alcohol intake to no more than 1 drink per day for nonpregnant women. One drink equals 12 ounces of beer, 5 ounces of wine, or 1 ounces of hard liquor.  Do not use street drugs.  Do not share needles.  Ask your health care provider for help if  you need support or information about quitting drugs.  Tell your health care provider if you often feel depressed.  Tell your health care provider if you have ever been abused or do not feel safe at home.   This information is not intended to replace advice given to you by your health care provider. Make sure you discuss any questions you have with your health care provider.   Document Released: 10/06/2010 Document Revised: 04/13/2014 Document Reviewed: 02/22/2013 Elsevier Interactive Patient Education [employer].

## 2015-07-11 NOTE — Progress Notes (Signed)
Subjective:    Patient ID: Carolyn [name], female    DOB: 1950/10/21, 65 y.o.   MRN: [id number]  PT presents to the office today to establish care.  Thyroid Problem Presents for follow-up visit. Symptoms include anxiety, depressed mood, diarrhea and dry skin. Patient reports no constipation, hair loss, palpitations, visual change or weight gain. The symptoms have been stable. Past treatments include levothyroxine. The treatment provided significant relief.  Depression      The patient presents with depression.  This is a chronic problem.  The current episode started more than 1 year ago.   The onset quality is gradual.   The problem has been resolved since onset.  Associated symptoms include restlessness, decreased interest and sad.  Associated symptoms include no helplessness, no hopelessness, no headaches and no suicidal ideas.  Past treatments include SSRIs - Selective serotonin reuptake inhibitors.  Compliance with treatment is good.  Past medical history includes thyroid problem, anxiety and depression.   Anxiety Presents for follow-up visit. Onset was more than 5 years ago. The problem has been waxing and waning. Symptoms include depressed mood, excessive worry, irritability, nervous/anxious behavior and restlessness. Patient reports no palpitations, panic, shortness of breath or suicidal ideas. Symptoms occur most days. The symptoms are aggravated by family issues (pt going through divorce).   Her past medical history is significant for anxiety/panic attacks and depression. Past treatments include SSRIs and benzodiazephines. The treatment provided moderate relief. Compliance with prior treatments has been good.  RLS Pt currently taking plaquenil 200 mg every night at bedtime and Klonopin 0.5 mg at bedtime. Pt states this is working well with no complaints.     Review of Systems  Constitutional: Positive for irritability. Negative for weight gain.  HENT: Negative.   Eyes: Negative.     Respiratory: Negative.  Negative for shortness of breath.   Cardiovascular: Negative.  Negative for palpitations.  Gastrointestinal: Positive for diarrhea. Negative for constipation.  Endocrine: Negative.   Genitourinary: Negative.   Musculoskeletal: Negative.   Neurological: Negative.  Negative for headaches.  Hematological: Negative.   Psychiatric/Behavioral: Positive for depression. Negative for suicidal ideas. The patient is nervous/anxious.   All other systems reviewed and are negative.      Objective:   Physical Exam  Constitutional: She is oriented to person, place, and time. She appears well-developed and well-nourished. No distress.  HENT:  Head: Normocephalic and atraumatic.  Right Ear: External ear normal.  Left Ear: External ear normal.  Oropharynx erythemas Nasal passage erythemas with mild swelling     Eyes: Pupils are equal, round, and reactive to light.  Neck: Normal range of motion. Neck supple. No thyromegaly present.  Cardiovascular: Normal rate, regular rhythm and intact distal pulses.   Murmur heard. Pulmonary/Chest: Effort normal and breath sounds normal. No respiratory distress. She has no wheezes.  Diminished breath sounds  Abdominal: Soft. Bowel sounds are normal. She exhibits no distension. There is no tenderness.  Musculoskeletal: Normal range of motion. She exhibits no edema or tenderness.  Neurological: She is alert and oriented to person, place, and time. She has normal reflexes. No cranial nerve deficit.  Skin: Skin is warm and dry.  Psychiatric: She has a normal mood and affect. Her behavior is normal. Judgment and thought content normal.  Vitals reviewed.   BP 125/84 mmHg  Pulse 66  Temp(Src) 97.1 F (36.2 C) (Oral)  Ht '5\' 4"'  (1.626 m)  Wt 142 lb 12.8 oz (64.774 kg)  BMI 24.50 kg/m2  Assessment & Plan:  1. Depression - CMP14+EGFR - clonazePAM (KLONOPIN) 0.5 MG tablet; Take 1 tablet (0.5 mg total) by mouth at bedtime.   Dispense: 30 tablet; Refill: 3 - citalopram (CELEXA) 20 MG tablet; Take 1 tablet (20 mg total) by mouth daily.  Dispense: 90 tablet; Refill: 1  2. GAD (generalized anxiety disorder) - CMP14+EGFR - clonazePAM (KLONOPIN) 0.5 MG tablet; Take 1 tablet (0.5 mg total) by mouth at bedtime.  Dispense: 30 tablet; Refill: 3 - citalopram (CELEXA) 20 MG tablet; Take 1 tablet (20 mg total) by mouth daily.  Dispense: 90 tablet; Refill: 1  3. Allergic rhinitis, unspecified allergic rhinitis type - CMP14+EGFR  4. Hypothyroidism, unspecified hypothyroidism type - CMP14+EGFR - Thyroid Panel With TSH  5. Restless leg syndrome - CMP14+EGFR  6. Need for hepatitis C screening test - CMP14+EGFR - Hepatitis C Antibody  7. Screening for HIV (human immunodeficiency virus) - CMP14+EGFR  8. Colon cancer screening - Ambulatory referral to Gastroenterology - Fecal occult blood, imunochemical; Future   Continue all meds Labs pending Health Maintenance reviewed Diet and exercise encouraged RTO 3 months   Evelina Dun, FNP

## 2015-07-12 ENCOUNTER — Other Ambulatory Visit: Payer: Self-pay | Admitting: Family

## 2015-07-12 LAB — THYROID PANEL WITH TSH
FREE THYROXINE INDEX: 2 (ref 1.2–4.9)
T3 UPTAKE RATIO: 27 % (ref 24–39)
T4 TOTAL: 7.5 ug/dL (ref 4.5–12.0)
TSH: 6.64 u[IU]/mL — AB (ref 0.450–4.500)

## 2015-07-12 LAB — CMP14+EGFR
A/G RATIO: 2.2 (ref 1.2–2.2)
ALT: 12 IU/L (ref 0–32)
AST: 24 IU/L (ref 0–40)
Albumin: 4.6 g/dL (ref 3.6–4.8)
Alkaline Phosphatase: 66 IU/L (ref 39–117)
BILIRUBIN TOTAL: 0.3 mg/dL (ref 0.0–1.2)
BUN/Creatinine Ratio: 16 (ref 12–28)
BUN: 12 mg/dL (ref 8–27)
CHLORIDE: 91 mmol/L — AB (ref 96–106)
CO2: 23 mmol/L (ref 18–29)
Calcium: 9.2 mg/dL (ref 8.7–10.3)
Creatinine, Ser: 0.76 mg/dL (ref 0.57–1.00)
GFR calc non Af Amer: 84 mL/min/{1.73_m2} (ref >59)
GFR, EST AFRICAN AMERICAN: 97 mL/min/{1.73_m2} (ref >59)
GLUCOSE: 98 mg/dL (ref 65–99)
Globulin, Total: 2.1 g/dL (ref 1.5–4.5)
POTASSIUM: 4.8 mmol/L (ref 3.5–5.2)
Sodium: 131 mmol/L — ABNORMAL LOW (ref 134–144)
TOTAL PROTEIN: 6.7 g/dL (ref 6.0–8.5)

## 2015-07-12 LAB — HEPATITIS C ANTIBODY: Hep C Virus Ab: <0.1 s/co ratio (ref 0.0–0.9)

## 2015-07-12 MED ORDER — LEVOTHYROXINE SODIUM 75 MCG PO TABS: 75.0000 ug | ORAL_TABLET | Freq: Every day | ORAL | Status: DC

## 2015-07-16 ENCOUNTER — Telehealth: Payer: Self-pay | Admitting: Family

## 2015-07-16 NOTE — Telephone Encounter (Signed)
Patient informed, she said her appointment is scheduled for 5/15 so she will not do stool cards

## 2015-07-16 NOTE — Telephone Encounter (Signed)
If patient is scheduled to get colonoscopy soon then she can hold off on FOBT. If it is several months before GI appt she needs to do FOBT and bring in.

## 2015-08-05 ENCOUNTER — Other Ambulatory Visit: Payer: Self-pay | Admitting: Dermatology

## 2015-08-05 DIAGNOSIS — D239 Other benign neoplasm of skin, unspecified: Secondary | ICD-10-CM | POA: Diagnosis not present

## 2015-08-05 DIAGNOSIS — D0439 Carcinoma in situ of skin of other parts of face: Secondary | ICD-10-CM | POA: Diagnosis not present

## 2015-08-05 DIAGNOSIS — C4491 Basal cell carcinoma of skin, unspecified: Secondary | ICD-10-CM

## 2015-08-05 DIAGNOSIS — C4441 Basal cell carcinoma of skin of scalp and neck: Secondary | ICD-10-CM | POA: Diagnosis not present

## 2015-08-05 HISTORY — DX: Basal cell carcinoma of skin, unspecified: C44.91

## 2015-08-19 DIAGNOSIS — D0439 Carcinoma in situ of skin of other parts of face: Secondary | ICD-10-CM | POA: Diagnosis not present

## 2015-09-11 DIAGNOSIS — Z1211 Encounter for screening for malignant neoplasm of colon: Secondary | ICD-10-CM | POA: Diagnosis not present

## 2015-09-11 DIAGNOSIS — K6289 Other specified diseases of anus and rectum: Secondary | ICD-10-CM | POA: Diagnosis not present

## 2015-09-18 ENCOUNTER — Other Ambulatory Visit: Payer: Self-pay

## 2015-09-18 MED ORDER — HYDROXYCHLOROQUINE SULFATE 200 MG PO TABS: 200.0000 mg | ORAL_TABLET | Freq: Every day | ORAL | Status: DC

## 2015-09-19 ENCOUNTER — Encounter: Payer: Self-pay | Admitting: Family

## 2015-09-19 ENCOUNTER — Other Ambulatory Visit: Payer: Self-pay | Admitting: Dermatology

## 2015-09-19 ENCOUNTER — Ambulatory Visit (INDEPENDENT_AMBULATORY_CARE_PROVIDER_SITE_OTHER): Payer: Medicare Other | Admitting: Family

## 2015-09-19 ENCOUNTER — Ambulatory Visit (INDEPENDENT_AMBULATORY_CARE_PROVIDER_SITE_OTHER): Payer: Medicare Other

## 2015-09-19 VITALS — BP 113/71 | HR 70 | Temp 97.2°F | Ht 64.0 in | Wt 145.8 lb

## 2015-09-19 DIAGNOSIS — Z720 Tobacco use: Secondary | ICD-10-CM

## 2015-09-19 DIAGNOSIS — D2272 Melanocytic nevi of left lower limb, including hip: Secondary | ICD-10-CM | POA: Diagnosis not present

## 2015-09-19 DIAGNOSIS — R062 Wheezing: Secondary | ICD-10-CM

## 2015-09-19 DIAGNOSIS — J209 Acute bronchitis, unspecified: Secondary | ICD-10-CM | POA: Diagnosis not present

## 2015-09-19 DIAGNOSIS — F17200 Nicotine dependence, unspecified, uncomplicated: Secondary | ICD-10-CM

## 2015-09-19 DIAGNOSIS — D0472 Carcinoma in situ of skin of left lower limb, including hip: Secondary | ICD-10-CM | POA: Diagnosis not present

## 2015-09-19 DIAGNOSIS — C4441 Basal cell carcinoma of skin of scalp and neck: Secondary | ICD-10-CM | POA: Diagnosis not present

## 2015-09-19 MED ORDER — LEVOFLOXACIN 500 MG PO TABS: 500.0000 mg | ORAL_TABLET | Freq: Every day | ORAL | Status: DC

## 2015-09-19 MED ORDER — BENZONATATE 200 MG PO CAPS: 200.0000 mg | ORAL_CAPSULE | Freq: Three times a day (TID) | ORAL | Status: DC | PRN

## 2015-09-19 MED ORDER — HYDROCODONE-HOMATROPINE 5-1.5 MG/5ML PO SYRP: 5.0000 mL | ORAL_SOLUTION | Freq: Three times a day (TID) | ORAL | Status: DC | PRN

## 2015-09-19 MED ORDER — METHYLPREDNISOLONE ACETATE 80 MG/ML IJ SUSP
80.0000 mg | Freq: Once | INTRAMUSCULAR | Status: AC
  Administered 2015-09-19: 80 mg via INTRAMUSCULAR

## 2015-09-19 MED ORDER — PREDNISONE 10 MG (21) PO TBPK: 10.0000 mg | ORAL_TABLET | Freq: Every day | ORAL | Status: DC

## 2015-09-19 NOTE — Addendum Note (Signed)
Addended by: Evelina Dun A on: 09/19/2015 02:35 PM   Modules accepted: Orders, Level of Service

## 2015-09-19 NOTE — Patient Instructions (Signed)
Acute Bronchitis Bronchitis is inflammation of the airways that extend from the windpipe into the lungs (bronchi). The inflammation often causes mucus to develop. This leads to a cough, which is the most common symptom of bronchitis.  In acute bronchitis, the condition usually develops suddenly and goes away over time, usually in a couple weeks. Smoking, allergies, and asthma can make bronchitis worse. Repeated episodes of bronchitis may cause further lung problems.  CAUSES Acute bronchitis is most often caused by the same virus that causes a cold. The virus can spread from person to person (contagious) through coughing, sneezing, and touching contaminated objects. SIGNS AND SYMPTOMS   Cough.   Fever.   Coughing up mucus.   Body aches.   Chest congestion.   Chills.   Shortness of breath.   Sore throat.  DIAGNOSIS  Acute bronchitis is usually diagnosed through a physical exam. Your health care provider will also ask you questions about your medical history. Tests, such as chest X-rays, are sometimes done to rule out other conditions.  TREATMENT  Acute bronchitis usually goes away in a couple weeks. Oftentimes, no medical treatment is necessary. Medicines are sometimes given for relief of fever or cough. Antibiotic medicines are usually not needed but may be prescribed in certain situations. In some cases, an inhaler may be recommended to help reduce shortness of breath and control the cough. A cool mist vaporizer may also be used to help thin bronchial secretions and make it easier to clear the chest.  HOME CARE INSTRUCTIONS  Get plenty of rest.   Drink enough fluids to keep your urine clear or pale yellow (unless you have a medical condition that requires fluid restriction). Increasing fluids may help thin your respiratory secretions (sputum) and reduce chest congestion, and it will prevent dehydration.   Take medicines only as directed by your health care provider.  If  you were prescribed an antibiotic medicine, finish it all even if you start to feel better.  Avoid smoking and secondhand smoke. Exposure to cigarette smoke or irritating chemicals will make bronchitis worse. If you are a smoker, consider using nicotine gum or skin patches to help control withdrawal symptoms. Quitting smoking will help your lungs heal faster.   Reduce the chances of another bout of acute bronchitis by washing your hands frequently, avoiding people with cold symptoms, and trying not to touch your hands to your mouth, nose, or eyes.   Keep all follow-up visits as directed by your health care provider.  SEEK MEDICAL CARE IF: Your symptoms do not improve after 1 week of treatment.  SEEK IMMEDIATE MEDICAL CARE IF:  You develop an increased fever or chills.   You have chest pain.   You have severe shortness of breath.  You have bloody sputum.   You develop dehydration.  You faint or repeatedly feel like you are going to pass out.  You develop repeated vomiting.  You develop a severe headache. MAKE SURE YOU:   Understand these instructions.  Will watch your condition.  Will get help right away if you are not doing well or get worse.   This information is not intended to replace advice given to you by your health care provider. Make sure you discuss any questions you have with your health care provider.   Document Released: 04/30/2004 Document Revised: 04/13/2014 Document Reviewed: 09/13/2012 Elsevier Interactive Patient Education 2016 [employer]  - Take meds as prescribed - Use a cool mist humidifier  -Use saline nose sprays frequently -Saline   irrigations of the nose can be very helpful if done frequently.  * 4X daily for 1 week*  * Use of a nettie pot can be helpful with this. Follow directions with this* -Force fluids -For any cough or congestion  Use plain Mucinex- regular strength or max strength is fine   * Children- consult with Pharmacist for  dosing -For fever or aces or pains- take tylenol or ibuprofen appropriate for age and weight.  * for fevers greater than 101 orally you may alternate ibuprofen and tylenol every  3 hours. -Throat lozenges if help    Martese Vanatta, FNP  

## 2015-09-19 NOTE — Progress Notes (Addendum)
Subjective:    Patient ID: Carolyn [name], female    DOB: 10-Oct-1950, 65 y.o.   MRN: [id number]  Cough This is a new problem. The current episode started 1 to 4 weeks ago. The problem has been unchanged. The problem occurs every few minutes. The cough is productive of purulent sputum. Associated symptoms include headaches, myalgias, nasal congestion, rhinorrhea, a sore throat, shortness of breath and wheezing. Pertinent negatives include no chills, ear congestion, ear pain or fever. The symptoms are aggravated by lying down. Risk factors for lung disease include smoking/tobacco exposure. She has tried rest and OTC cough suppressant for the symptoms. The treatment provided mild relief. There is no history of asthma or COPD.      Review of Systems  Constitutional: Negative for fever and chills.  HENT: Positive for rhinorrhea and sore throat. Negative for ear pain.   Respiratory: Positive for cough, shortness of breath and wheezing.   Musculoskeletal: Positive for myalgias.  Neurological: Positive for headaches.  All other systems reviewed and are negative.      Objective:   Physical Exam  Constitutional: She is oriented to person, place, and time. She appears well-developed and well-nourished. No distress.  HENT:  Head: Normocephalic.  Eyes: Pupils are equal, round, and reactive to light.  Neck: Normal range of motion. Neck supple. No thyromegaly present.  Cardiovascular: Normal rate, regular rhythm, normal heart sounds and intact distal pulses.   No murmur heard. Pulmonary/Chest: Effort normal. No respiratory distress. She has wheezes.  Abdominal: Soft. Bowel sounds are normal. She exhibits no distension. There is no tenderness.  Musculoskeletal: Normal range of motion. She exhibits no edema or tenderness.  Neurological: She is alert and oriented to person, place, and time.  Skin: Skin is warm and dry.  Psychiatric: She has a normal mood and affect. Her behavior is normal.  Judgment and thought content normal.  Vitals reviewed.     BP 113/71 mmHg  Pulse 70  Temp(Src) 97.2 F (36.2 C) (Oral)  Ht 5\' 4"  (1.626 m)  Wt 145 lb 12.8 oz (66.134 kg)  BMI 25.01 kg/m2     Assessment & Plan:  1. Acute bronchitis, unspecified organism -- Take meds as prescribed - Use a cool mist humidifier  -Use saline nose sprays frequently -Saline irrigations of the nose can be very helpful if done frequently.  * 4X daily for 1 week*  * Use of a nettie pot can be helpful with this. Follow directions with this* -Force fluids -For any cough or congestion  Use plain Mucinex- regular strength or max strength is fine   * Children- consult with Pharmacist for dosing -For fever or aces or pains- take tylenol or ibuprofen appropriate for age and weight.  * for fevers greater than 101 orally you may alternate ibuprofen and tylenol every  3 hours. -Throat lozenges if help - levofloxacin (LEVAQUIN) 500 MG tablet; Take 1 tablet (500 mg total) by mouth daily.  Dispense: 7 tablet; Refill: 0 - predniSONE (STERAPRED UNI-PAK 21 TAB) 10 MG (21) TBPK tablet; Take 1 tablet (10 mg total) by mouth daily. As directed x 6 days  Dispense: 21 tablet; Refill: 0 - benzonatate (TESSALON) 200 MG capsule; Take 1 capsule (200 mg total) by mouth 3 (three) times daily as needed.  Dispense: 30 capsule; Refill: 1 - HYDROcodone-homatropine (HYCODAN) 5-1.5 MG/5ML syrup; Take 5 mLs by mouth every 8 (eight) hours as needed for cough.  Dispense: 120 mL; Refill: 0 - methylPREDNISolone acetate (DEPO-MEDROL) injection 80 mg;  Inject 1 mL (80 mg total) into the muscle once.  2. Wheezing - DG Chest 2 View; Future  3. Smoker - DG Chest 2 View; Future  Evelina Dun, FNP

## 2015-09-27 ENCOUNTER — Encounter: Payer: Self-pay | Admitting: Family

## 2015-10-10 ENCOUNTER — Encounter: Payer: Self-pay | Admitting: Family

## 2015-10-10 ENCOUNTER — Ambulatory Visit (INDEPENDENT_AMBULATORY_CARE_PROVIDER_SITE_OTHER): Payer: Medicare Other | Admitting: Family

## 2015-10-10 VITALS — BP 140/81 | HR 64 | Temp 97.9°F | Ht 64.0 in | Wt 143.2 lb

## 2015-10-10 DIAGNOSIS — E039 Hypothyroidism, unspecified: Secondary | ICD-10-CM

## 2015-10-10 DIAGNOSIS — Z13220 Encounter for screening for lipoid disorders: Secondary | ICD-10-CM

## 2015-10-10 DIAGNOSIS — Z7289 Other problems related to lifestyle: Secondary | ICD-10-CM | POA: Diagnosis not present

## 2015-10-10 DIAGNOSIS — Z114 Encounter for screening for human immunodeficiency virus [HIV]: Secondary | ICD-10-CM

## 2015-10-10 DIAGNOSIS — F411 Generalized anxiety disorder: Secondary | ICD-10-CM

## 2015-10-10 DIAGNOSIS — J309 Allergic rhinitis, unspecified: Secondary | ICD-10-CM

## 2015-10-10 DIAGNOSIS — G2581 Restless legs syndrome: Secondary | ICD-10-CM | POA: Diagnosis not present

## 2015-10-10 DIAGNOSIS — IMO0001 Reserved for inherently not codable concepts without codable children: Secondary | ICD-10-CM

## 2015-10-10 DIAGNOSIS — F329 Major depressive disorder, single episode, unspecified: Secondary | ICD-10-CM

## 2015-10-10 DIAGNOSIS — Z136 Encounter for screening for cardiovascular disorders: Secondary | ICD-10-CM | POA: Diagnosis not present

## 2015-10-10 DIAGNOSIS — F32A Depression, unspecified: Secondary | ICD-10-CM

## 2015-10-10 NOTE — Progress Notes (Signed)
Subjective:    Patient ID: Carolyn [name], female    DOB: [birth year], 65 y.o.   MRN: [id number]  PT presents to the office today to for chronic follow up.  Thyroid Problem Presents for follow-up visit. Symptoms include anxiety, depressed mood and dry skin. Patient reports no constipation, diarrhea, hair loss, palpitations, visual change or weight gain. The symptoms have been stable. Past treatments include levothyroxine. The treatment provided significant relief.  Depression      The patient presents with depression.  This is a chronic problem.  The current episode started more than 1 year ago.   The onset quality is gradual.   The problem has been resolved since onset.  Associated symptoms include restlessness, decreased interest and sad.  Associated symptoms include no helplessness, no hopelessness, no headaches and no suicidal ideas.  Past treatments include SSRIs - Selective serotonin reuptake inhibitors.  Compliance with treatment is good.  Past medical history includes thyroid problem, anxiety and depression.   Anxiety Presents for follow-up visit. Onset was more than 5 years ago. The problem has been waxing and waning. Symptoms include depressed mood, excessive worry, irritability, nervous/anxious behavior and restlessness. Patient reports no palpitations, panic, shortness of breath or suicidal ideas. Symptoms occur most days. The symptoms are aggravated by family issues (pt going through divorce).   Her past medical history is significant for anxiety/panic attacks and depression. Past treatments include SSRIs and benzodiazephines. The treatment provided moderate relief. Compliance with prior treatments has been good.  RLS Pt currently taking plaquenil 200 mg every night at bedtime and Klonopin 0.5 mg at bedtime. Pt states this is working well with no complaints.     Review of Systems  Constitutional: Positive for irritability. Negative for weight gain.  HENT: Negative.   Eyes:  Negative.   Respiratory: Negative.  Negative for shortness of breath.   Cardiovascular: Negative.  Negative for palpitations.  Gastrointestinal: Negative for diarrhea and constipation.  Endocrine: Negative.   Genitourinary: Negative.   Musculoskeletal: Negative.   Neurological: Negative.  Negative for headaches.  Hematological: Negative.   Psychiatric/Behavioral: Positive for depression. Negative for suicidal ideas. The patient is nervous/anxious.   All other systems reviewed and are negative.      Objective:   Physical Exam  Constitutional: She is oriented to person, place, and time. She appears well-developed and well-nourished. No distress.  HENT:  Head: Normocephalic and atraumatic.  Right Ear: External ear normal.  Left Ear: External ear normal.  Nose: Nose normal.  Mouth/Throat: Oropharynx is clear and moist.      Eyes: Pupils are equal, round, and reactive to light.  Neck: Normal range of motion. Neck supple. No thyromegaly present.  Cardiovascular: Normal rate, regular rhythm and intact distal pulses.   Murmur heard. Pulmonary/Chest: Effort normal and breath sounds normal. No respiratory distress. She has no wheezes.  Diminished breath sounds  Abdominal: Soft. Bowel sounds are normal. She exhibits no distension. There is no tenderness.  Musculoskeletal: Normal range of motion. She exhibits no edema or tenderness.  Neurological: She is alert and oriented to person, place, and time. She has normal reflexes. No cranial nerve deficit.  Skin: Skin is warm and dry.  Psychiatric: She has a normal mood and affect. Her behavior is normal. Judgment and thought content normal.  Vitals reviewed.   BP 140/81 mmHg  Pulse 64  Temp(Src) 97.9 F (36.6 C) (Oral)  Ht '5\' 4"'  (1.626 m)  Wt 143 lb 3.2 oz (64.955 kg)  BMI 24.57 kg/m2  Assessment & Plan:  1. Allergic rhinitis, unspecified allergic rhinitis type - CMP14+EGFR  2. GAD (generalized anxiety disorder) -  CMP14+EGFR  3. Depression - CMP14+EGFR  4. Restless leg syndrome - CMP14+EGFR  5. Encounter for cholesteral screening for cardiovascular disease - CMP14+EGFR - Lipid panel  6. Hypothyroidism, unspecified hypothyroidism type - CMP14+EGFR - Thyroid Panel With TSH  7. Screening for HIV (human immunodeficiency virus) - CMP14+EGFR - HIV antibody   Continue all meds Labs pending Health Maintenance reviewed Diet and exercise encouraged RTO 6 months   Evelina Dun, FNP

## 2015-10-10 NOTE — Patient Instructions (Signed)
Health Maintenance, Female Adopting a healthy lifestyle and getting preventive care can go a long way to promote health and wellness. Talk with your health care provider about what schedule of regular examinations is right for you. This is a good chance for you to check in with your provider about disease prevention and staying healthy. In between checkups, there are plenty of things you can do on your own. Experts have done a lot of research about which lifestyle changes and preventive measures are most likely to keep you healthy. Ask your health care provider for more information. WEIGHT AND DIET  Eat a healthy diet  Be sure to include plenty of vegetables, fruits, low-fat dairy products, and lean protein.  Do not eat a lot of foods high in solid fats, added sugars, or salt.  Get regular exercise. This is one of the most important things you can do for your health.  Most adults should exercise for at least 150 minutes each week. The exercise should increase your heart rate and make you sweat (moderate-intensity exercise).  Most adults should also do strengthening exercises at least twice a week. This is in addition to the moderate-intensity exercise.  Maintain a healthy weight  Body mass index (BMI) is a measurement that can be used to identify possible weight problems. It estimates body fat based on height and weight. Your health care provider can help determine your BMI and help you achieve or maintain a healthy weight.  For females 20 years of age and older:   A BMI below 18.5 is considered underweight.  A BMI of 18.5 to 24.9 is normal.  A BMI of 25 to 29.9 is considered overweight.  A BMI of 30 and above is considered obese.  Watch levels of cholesterol and blood lipids  You should start having your blood tested for lipids and cholesterol at 63 years of age, then have this test every 5 years.  You may need to have your cholesterol levels checked more often if:  Your lipid  or cholesterol levels are high.  You are older than 63 years of age.  You are at high risk for heart disease.  CANCER SCREENING   Lung Cancer  Lung cancer screening is recommended for adults 55-80 years old who are at high risk for lung cancer because of a history of smoking.  A yearly low-dose CT scan of the lungs is recommended for people who:  Currently smoke.  Have quit within the past 15 years.  Have at least a 30-pack-year history of smoking. A pack year is smoking an average of one pack of cigarettes a day for 1 year.  Yearly screening should continue until it has been 15 years since you quit.  Yearly screening should stop if you develop a health problem that would prevent you from having lung cancer treatment.  Breast Cancer  Practice breast self-awareness. This means understanding how your breasts normally appear and feel.  It also means doing regular breast self-exams. Let your health care provider know about any changes, no matter how small.  If you are in your 20s or 30s, you should have a clinical breast exam (CBE) by a health care provider every 1-3 years as part of a regular health exam.  If you are 40 or older, have a CBE every year. Also consider having a breast X-ray (mammogram) every year.  If you have a family history of breast cancer, talk to your health care provider about genetic screening.  If you   are at high risk for breast cancer, talk to your health care provider about having an MRI and a mammogram every year.  Breast cancer gene (BRCA) assessment is recommended for women who have family members with BRCA-related cancers. BRCA-related cancers include:  Breast.  Ovarian.  Tubal.  Peritoneal cancers.  Results of the assessment will determine the need for genetic counseling and BRCA1 and BRCA2 testing. Cervical Cancer Your health care provider may recommend that you be screened regularly for cancer of the pelvic organs (ovaries, uterus, and  vagina). This screening involves a pelvic examination, including checking for microscopic changes to the surface of your cervix (Pap test). You may be encouraged to have this screening done every 3 years, beginning at age 21.  For women ages 30-65, health care providers may recommend pelvic exams and Pap testing every 3 years, or they may recommend the Pap and pelvic exam, combined with testing for human papilloma virus (HPV), every 5 years. Some types of HPV increase your risk of cervical cancer. Testing for HPV may also be done on women of any age with unclear Pap test results.  Other health care providers may not recommend any screening for nonpregnant women who are considered low risk for pelvic cancer and who do not have symptoms. Ask your health care provider if a screening pelvic exam is right for you.  If you have had past treatment for cervical cancer or a condition that could lead to cancer, you need Pap tests and screening for cancer for at least 20 years after your treatment. If Pap tests have been discontinued, your risk factors (such as having a new sexual partner) need to be reassessed to determine if screening should resume. Some women have medical problems that increase the chance of getting cervical cancer. In these cases, your health care provider may recommend more frequent screening and Pap tests. Colorectal Cancer  This type of cancer can be detected and often prevented.  Routine colorectal cancer screening usually begins at 63 years of age and continues through 63 years of age.  Your health care provider may recommend screening at an earlier age if you have risk factors for colon cancer.  Your health care provider may also recommend using home test kits to check for hidden blood in the stool.  A small camera at the end of a tube can be used to examine your colon directly (sigmoidoscopy or colonoscopy). This is done to check for the earliest forms of colorectal  cancer.  Routine screening usually begins at age 50.  Direct examination of the colon should be repeated every 5-10 years through 63 years of age. However, you may need to be screened more often if early forms of precancerous polyps or small growths are found. Skin Cancer  Check your skin from head to toe regularly.  Tell your health care provider about any new moles or changes in moles, especially if there is a change in a mole's shape or color.  Also tell your health care provider if you have a mole that is larger than the size of a pencil eraser.  Always use sunscreen. Apply sunscreen liberally and repeatedly throughout the day.  Protect yourself by wearing long sleeves, pants, a wide-brimmed hat, and sunglasses whenever you are outside. HEART DISEASE, DIABETES, AND HIGH BLOOD PRESSURE   High blood pressure causes heart disease and increases the risk of stroke. High blood pressure is more likely to develop in:  People who have blood pressure in the high end   of the normal range (130-139/85-89 mm Hg).  People who are overweight or obese.  People who are African American.  If you are 38-23 years of age, have your blood pressure checked every 3-5 years. If you are 61 years of age or older, have your blood pressure checked every year. You should have your blood pressure measured twice--once when you are at a hospital or clinic, and once when you are not at a hospital or clinic. Record the average of the two measurements. To check your blood pressure when you are not at a hospital or clinic, you can use:  An automated blood pressure machine at a pharmacy.  A home blood pressure monitor.  If you are between 45 years and 39 years old, ask your health care provider if you should take aspirin to prevent strokes.  Have regular diabetes screenings. This involves taking a blood sample to check your fasting blood sugar level.  If you are at a normal weight and have a low risk for diabetes,  have this test once every three years after 63 years of age.  If you are overweight and have a high risk for diabetes, consider being tested at a younger age or more often. PREVENTING INFECTION  Hepatitis B  If you have a higher risk for hepatitis B, you should be screened for this virus. You are considered at high risk for hepatitis B if:  You were born in a country where hepatitis B is common. Ask your health care provider which countries are considered high risk.  Your parents were born in a high-risk country, and you have not been immunized against hepatitis B (hepatitis B vaccine).  You have HIV or AIDS.  You use needles to inject street drugs.  You live with someone who has hepatitis B.  You have had sex with someone who has hepatitis B.  You get hemodialysis treatment.  You take certain medicines for conditions, including cancer, organ transplantation, and autoimmune conditions. Hepatitis C  Blood testing is recommended for:  Everyone born from [birth year] through 1963.  Anyone with known risk factors for hepatitis C. Sexually transmitted infections (STIs)  You should be screened for sexually transmitted infections (STIs) including gonorrhea and chlamydia if:  You are sexually active and are younger than 63 years of age.  You are older than 63 years of age and your health care provider tells you that you are at risk for this type of infection.  Your sexual activity has changed since you were last screened and you are at an increased risk for chlamydia or gonorrhea. Ask your health care provider if you are at risk.  If you do not have HIV, but are at risk, it may be recommended that you take a prescription medicine daily to prevent HIV infection. This is called pre-exposure prophylaxis (PrEP). You are considered at risk if:  You are sexually active and do not regularly use condoms or know the HIV status of your partner(s).  You take drugs by injection.  You are sexually  active with a partner who has HIV. Talk with your health care provider about whether you are at high risk of being infected with HIV. If you choose to begin PrEP, you should first be tested for HIV. You should then be tested every 3 months for as long as you are taking PrEP.  PREGNANCY   If you are premenopausal and you may become pregnant, ask your health care provider about preconception counseling.  If you may  become pregnant, take 400 to 800 micrograms (mcg) of folic acid every day.  If you want to prevent pregnancy, talk to your health care provider about birth control (contraception). OSTEOPOROSIS AND MENOPAUSE   Osteoporosis is a disease in which the bones lose minerals and strength with aging. This can result in serious bone fractures. Your risk for osteoporosis can be identified using a bone density scan.  If you are 61 years of age or older, or if you are at risk for osteoporosis and fractures, ask your health care provider if you should be screened.  Ask your health care provider whether you should take a calcium or vitamin D supplement to lower your risk for osteoporosis.  Menopause may have certain physical symptoms and risks.  Hormone replacement therapy may reduce some of these symptoms and risks. Talk to your health care provider about whether hormone replacement therapy is right for you.  HOME CARE INSTRUCTIONS   Schedule regular health, dental, and eye exams.  Stay current with your immunizations.   Do not use any tobacco products including cigarettes, chewing tobacco, or electronic cigarettes.  If you are pregnant, do not drink alcohol.  If you are breastfeeding, limit how much and how often you drink alcohol.  Limit alcohol intake to no more than 1 drink per day for nonpregnant women. One drink equals 12 ounces of beer, 5 ounces of wine, or 1 ounces of hard liquor.  Do not use street drugs.  Do not share needles.  Ask your health care provider for help if  you need support or information about quitting drugs.  Tell your health care provider if you often feel depressed.  Tell your health care provider if you have ever been abused or do not feel safe at home.   This information is not intended to replace advice given to you by your health care provider. Make sure you discuss any questions you have with your health care provider.   Document Released: 10/06/2010 Document Revised: 04/13/2014 Document Reviewed: 02/22/2013 Elsevier Interactive Patient Education [employer].

## 2015-10-11 LAB — CMP14+EGFR
ALBUMIN: 4.3 g/dL (ref 3.6–4.8)
ALK PHOS: 61 IU/L (ref 39–117)
ALT: 11 IU/L (ref 0–32)
AST: 21 IU/L (ref 0–40)
Albumin/Globulin Ratio: 2 (ref 1.2–2.2)
BUN / CREAT RATIO: 11 — AB (ref 12–28)
BUN: 8 mg/dL (ref 8–27)
Bilirubin Total: 0.6 mg/dL (ref 0.0–1.2)
CO2: 23 mmol/L (ref 18–29)
CREATININE: 0.74 mg/dL (ref 0.57–1.00)
Calcium: 9.3 mg/dL (ref 8.7–10.3)
Chloride: 91 mmol/L — ABNORMAL LOW (ref 96–106)
GFR calc non Af Amer: 87 mL/min/{1.73_m2} (ref >59)
GFR, EST AFRICAN AMERICAN: 100 mL/min/{1.73_m2} (ref >59)
GLUCOSE: 89 mg/dL (ref 65–99)
Globulin, Total: 2.2 g/dL (ref 1.5–4.5)
Potassium: 4.4 mmol/L (ref 3.5–5.2)
Sodium: 130 mmol/L — ABNORMAL LOW (ref 134–144)
TOTAL PROTEIN: 6.5 g/dL (ref 6.0–8.5)

## 2015-10-11 LAB — LIPID PANEL
CHOLESTEROL TOTAL: 192 mg/dL (ref 100–199)
Chol/HDL Ratio: 2.1 ratio units (ref 0.0–4.4)
HDL: 90 mg/dL (ref >39)
LDL CALC: 84 mg/dL (ref 0–99)
Triglycerides: 89 mg/dL (ref 0–149)
VLDL CHOLESTEROL CAL: 18 mg/dL (ref 5–40)

## 2015-10-11 LAB — HIV ANTIBODY (ROUTINE TESTING W REFLEX): HIV Screen 4th Generation wRfx: NONREACTIVE

## 2015-10-11 LAB — THYROID PANEL WITH TSH
FREE THYROXINE INDEX: 1.9 (ref 1.2–4.9)
T3 Uptake Ratio: 28 % (ref 24–39)
T4, Total: 6.9 ug/dL (ref 4.5–12.0)
TSH: 4.94 u[IU]/mL — AB (ref 0.450–4.500)

## 2015-10-14 ENCOUNTER — Other Ambulatory Visit: Payer: Self-pay | Admitting: Family

## 2015-10-14 MED ORDER — LEVOTHYROXINE SODIUM 88 MCG PO TABS: 88.0000 ug | ORAL_TABLET | Freq: Every day | ORAL | Status: DC

## 2015-10-15 ENCOUNTER — Encounter: Payer: Self-pay | Admitting: *Deleted

## 2015-10-18 ENCOUNTER — Encounter: Payer: Self-pay | Admitting: Obstetrics & Gynecology

## 2015-10-18 DIAGNOSIS — Z1231 Encounter for screening mammogram for malignant neoplasm of breast: Secondary | ICD-10-CM | POA: Diagnosis not present

## 2015-10-28 DIAGNOSIS — D0439 Carcinoma in situ of skin of other parts of face: Secondary | ICD-10-CM | POA: Diagnosis not present

## 2015-11-12 ENCOUNTER — Other Ambulatory Visit: Payer: Self-pay | Admitting: Family

## 2015-11-12 DIAGNOSIS — F411 Generalized anxiety disorder: Secondary | ICD-10-CM

## 2015-11-12 DIAGNOSIS — F32A Depression, unspecified: Secondary | ICD-10-CM

## 2015-11-12 DIAGNOSIS — F329 Major depressive disorder, single episode, unspecified: Secondary | ICD-10-CM

## 2015-11-13 NOTE — Telephone Encounter (Signed)
Refill called to Carolyn [name], [place] [facility]

## 2015-11-20 DIAGNOSIS — H40033 Anatomical narrow angle, bilateral: Secondary | ICD-10-CM | POA: Diagnosis not present

## 2015-11-20 DIAGNOSIS — H2513 Age-related nuclear cataract, bilateral: Secondary | ICD-10-CM | POA: Diagnosis not present

## 2015-11-25 ENCOUNTER — Encounter: Payer: Self-pay | Admitting: Obstetrics and Gynecology

## 2015-11-26 DIAGNOSIS — Z23 Encounter for immunization: Secondary | ICD-10-CM | POA: Diagnosis not present

## 2015-12-11 ENCOUNTER — Ambulatory Visit (INDEPENDENT_AMBULATORY_CARE_PROVIDER_SITE_OTHER): Payer: Medicare Other | Admitting: Family Medicine

## 2015-12-11 ENCOUNTER — Other Ambulatory Visit: Payer: Self-pay | Admitting: Family

## 2015-12-11 ENCOUNTER — Encounter: Payer: Self-pay | Admitting: Family Medicine

## 2015-12-11 VITALS — BP 139/80 | HR 68 | Temp 97.3°F | Ht 64.0 in | Wt 141.0 lb

## 2015-12-11 DIAGNOSIS — R059 Cough, unspecified: Secondary | ICD-10-CM

## 2015-12-11 DIAGNOSIS — R05 Cough: Secondary | ICD-10-CM

## 2015-12-11 MED ORDER — METHYLPREDNISOLONE ACETATE 80 MG/ML IJ SUSP
80.0000 mg | Freq: Once | INTRAMUSCULAR | Status: AC
  Administered 2015-12-11: 80 mg via INTRAMUSCULAR

## 2015-12-11 MED ORDER — AMOXICILLIN-POT CLAVULANATE 875-125 MG PO TABS: 1.0000 | ORAL_TABLET | Freq: Two times a day (BID) | ORAL | 0 refills | Status: DC

## 2015-12-11 MED ORDER — FLUTICASONE PROPIONATE 50 MCG/ACT NA SUSP: 1.0000 | Freq: Every day | NASAL | 6 refills | Status: DC

## 2015-12-11 MED ORDER — HYDROXYCHLOROQUINE SULFATE 200 MG PO TABS: 200.0000 mg | ORAL_TABLET | Freq: Every day | ORAL | 5 refills | Status: DC

## 2015-12-11 NOTE — Progress Notes (Signed)
Subjective:    Patient ID: Carolyn [name], female    DOB: 01-31-1951, 65 y.o.   MRN: [id number]  HPI Patient here today for sinus pressure, cough and congestion that started about 1 week ago.  Symptoms seem to start in the sinuses with pressure and moved down to her chest where she has been wheezing and coughing coughing is productive of sputum sometimes colored sometimes clear. She denies any fever or chills. She continues to smoke. She does not have any inhalers but has had albuterol in the past.    Patient Active Problem List   Diagnosis Date Noted  . Depression 07/11/2015  . GAD (generalized anxiety disorder) 07/11/2015  . Allergic rhinitis 07/11/2015  . Hypothyroidism 07/11/2015  . Restless leg syndrome 07/11/2015  . ASCUS with positive high risk HPV 03/04/2015  . Low grade squamous intraepith lesion on cytologic smear cervix (lgsil) 01/11/2015  . Atypical glandular cells of undetermined significance (AGUS) on cervical Pap smear 11/06/2013  . Postmenopausal state 10/09/2013  . History of cervical dysplasia 10/09/2013   Outpatient Encounter Prescriptions as of 12/11/2015  Medication Sig  . Calcium Carbonate (CALCIUM 600 PO) Take by mouth.  . citalopram (CELEXA) 20 MG tablet Take 1 tablet (20 mg total) by mouth daily.  . clonazePAM (KLONOPIN) 0.5 MG tablet TAKE ONE TABLET (0.5 MG TOTAL) BY MOUTH AT BEDTIME  . Cyanocobalamin (VITAMIN B 12 PO) Take 1,000 mcg by mouth daily.  . fluticasone (FLONASE) 50 MCG/ACT nasal spray   . Garlic 123XX123 MG CAPS Take by mouth.  . hydroxychloroquine (PLAQUENIL) 200 MG tablet TAKE ONE TABLET BY MOUTH ONCE DAILY  . levothyroxine (SYNTHROID) 88 MCG tablet Take 1 tablet (88 mcg total) by mouth daily before breakfast.  . Multiple Vitamins-Minerals (CENTRUM SILVER ADULT 50+ PO) Take 1 tablet by mouth daily.  . Omega-3 Fatty Acids (FISH OIL) 1000 MG CAPS Take by mouth.  . prenatal vitamin w/FE, FA (PRENATAL 1 + 1) 27-1 MG TABS tablet Take 1 tablet by  mouth daily at 12 noon.  . [DISCONTINUED] Potassium 99 MG TABS Take by mouth.   No facility-administered encounter medications on file as of 12/11/2015.       Review of Systems  Constitutional: Negative.  Negative for fever.  HENT: Positive for congestion and sinus pressure.   Eyes: Negative.   Respiratory: Positive for cough and wheezing.   Cardiovascular: Negative.   Gastrointestinal: Negative.   Endocrine: Negative.   Genitourinary: Negative.   Musculoskeletal: Negative.   Skin: Negative.   Allergic/Immunologic: Negative.   Neurological: Positive for dizziness.  Hematological: Negative.   Psychiatric/Behavioral: Negative.        Objective:   Physical Exam  Constitutional: She is oriented to person, place, and time. She appears well-developed and well-nourished.  HENT:  Sinuses are tender to percussion.  Cardiovascular: Normal rate.   Pulmonary/Chest: Effort normal. She has wheezes.  Neurological: She is alert and oriented to person, place, and time.  Psychiatric: She has a normal mood and affect.   BP 139/80 (BP Location: Left Arm)   Pulse 68   Temp 97.3 F (36.3 C) (Oral)   Ht 5\' 4"  (1.626 m)   Wt 141 lb (64 kg)   BMI 24.20 kg/m         Assessment & Plan:  1. Cough I believe she has sinobronchitis. Will cover her with Augmentin. Also gave her a sample of Advair. Stop smoking. Drink plenty of liquids. - methylPREDNISolone acetate (DEPO-MEDROL) injection 80 mg; Inject 1 mL (  80 mg total) into the muscle once.  Wardell Honour MD

## 2015-12-25 ENCOUNTER — Other Ambulatory Visit: Payer: Self-pay | Admitting: Dermatology

## 2015-12-25 DIAGNOSIS — B079 Viral wart, unspecified: Secondary | ICD-10-CM | POA: Diagnosis not present

## 2015-12-25 DIAGNOSIS — C44622 Squamous cell carcinoma of skin of right upper limb, including shoulder: Secondary | ICD-10-CM | POA: Diagnosis not present

## 2015-12-25 DIAGNOSIS — D485 Neoplasm of uncertain behavior of skin: Secondary | ICD-10-CM | POA: Diagnosis not present

## 2015-12-25 DIAGNOSIS — B078 Other viral warts: Secondary | ICD-10-CM | POA: Diagnosis not present

## 2015-12-25 DIAGNOSIS — C44629 Squamous cell carcinoma of skin of left upper limb, including shoulder: Secondary | ICD-10-CM | POA: Diagnosis not present

## 2016-01-06 ENCOUNTER — Ambulatory Visit (INDEPENDENT_AMBULATORY_CARE_PROVIDER_SITE_OTHER): Payer: Medicare Other | Admitting: Obstetrics and Gynecology

## 2016-01-06 ENCOUNTER — Encounter: Payer: Self-pay | Admitting: Obstetrics and Gynecology

## 2016-01-06 VITALS — BP 159/62 | HR 57

## 2016-01-06 DIAGNOSIS — Z124 Encounter for screening for malignant neoplasm of cervix: Secondary | ICD-10-CM

## 2016-01-06 DIAGNOSIS — Z01419 Encounter for gynecological examination (general) (routine) without abnormal findings: Secondary | ICD-10-CM | POA: Diagnosis present

## 2016-01-06 DIAGNOSIS — Z1151 Encounter for screening for human papillomavirus (HPV): Secondary | ICD-10-CM

## 2016-01-06 DIAGNOSIS — R87610 Atypical squamous cells of undetermined significance on cytologic smear of cervix (ASC-US): Secondary | ICD-10-CM | POA: Diagnosis not present

## 2016-01-06 NOTE — Progress Notes (Signed)
Subjective:     Carolyn [name] is a 65 y.o. female [id number] who is here for a comprehensive physical exam. The patient reports no problems. She denies any abnormal discharge or vaginal bleeding. Patient with previous CKC. 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 She is not sexually active. She denies any urinary incontinence. She is currently being treated for skin cancer with plans for surgical treatment in December. She also has a joint replacement surgery scheduled on 01/21/16  Past Medical History:  Diagnosis Date  . Cervical cancer ([place])   . Depression   . Melanoma ([place])   . Thyroid disease    Past Surgical History:  Procedure Laterality Date  . CERVICAL CONIZATION W/BX    . MELANOMA EXCISION  08/2014   back  . SHOULDER SURGERY Right    Family History  Problem Relation Age of Onset  . Heart disease Mother   . Aneurysm Mother   . Heart disease Father   . Kidney failure Father     Social History   Social History  . Marital status: Divorced    Spouse name: N/A  . Number of children: N/A  . Years of education: N/A   Occupational History  . Not on file.   Social History Main Topics  . Smoking status: Current Every Day Smoker    Packs/day: 1.00    Types: Cigarettes  . Smokeless tobacco: Never Used  . Alcohol use No  . Drug use: No  . Sexual activity: Yes    Partners: Male    Birth control/ protection: Post-menopausal   Other Topics Concern  . Not on file   Social History Narrative  . No narrative on file   Health Maintenance  Topic Date Due  . MAMMOGRAM  10/15/2016  . PAP SMEAR  01/10/2018  . TETANUS/TDAP  06/01/2025  . COLONOSCOPY  09/10/2025  . INFLUENZA VACCINE  Completed  . ZOSTAVAX  Completed  . Hepatitis C Screening  Completed  . HIV Screening  Completed       Review of Systems Pertinent items are noted in HPI.   Objective:      GENERAL: Well-developed, well-nourished female in no acute  distress.  HEENT: Normocephalic, atraumatic. Sclerae anicteric.  NECK: Supple. Normal thyroid.  LUNGS: Clear to auscultation bilaterally.  HEART: Regular rate and rhythm. BREASTS: Symmetric in size. No palpable masses or lymphadenopathy, skin changes, or nipple drainage. ABDOMEN: Soft, nontender, nondistended. No organomegaly. PELVIC: Normal external female genitalia. Vagina is pink and rugated.  Normal discharge. Normal appearing cervix. Uterus is normal in size. No adnexal mass or tenderness. EXTREMITIES: No cyanosis, clubbing, or edema, 2+ distal pulses.    Assessment:    Healthy female exam.      Plan:    pap smear collected Patient reports a normal 3D mammogram this past summer Patient will be contacted with any abnormal results See After Visit Summary for Counseling Recommendations

## 2016-01-07 LAB — CYTOLOGY - PAP

## 2016-01-08 ENCOUNTER — Other Ambulatory Visit: Payer: Self-pay | Admitting: Family

## 2016-01-08 DIAGNOSIS — F329 Major depressive disorder, single episode, unspecified: Secondary | ICD-10-CM

## 2016-01-08 DIAGNOSIS — F411 Generalized anxiety disorder: Secondary | ICD-10-CM

## 2016-01-08 DIAGNOSIS — F32A Depression, unspecified: Secondary | ICD-10-CM

## 2016-01-08 NOTE — Telephone Encounter (Signed)
Refill called to [facility] in [place]

## 2016-01-10 ENCOUNTER — Ambulatory Visit (INDEPENDENT_AMBULATORY_CARE_PROVIDER_SITE_OTHER): Payer: Medicare Other

## 2016-01-10 ENCOUNTER — Encounter: Payer: Self-pay | Admitting: Family

## 2016-01-10 ENCOUNTER — Ambulatory Visit (INDEPENDENT_AMBULATORY_CARE_PROVIDER_SITE_OTHER): Payer: Medicare Other | Admitting: Family

## 2016-01-10 VITALS — BP 128/71 | HR 58 | Temp 97.6°F | Ht 64.0 in | Wt 139.2 lb

## 2016-01-10 DIAGNOSIS — F331 Major depressive disorder, recurrent, moderate: Secondary | ICD-10-CM

## 2016-01-10 DIAGNOSIS — Z01818 Encounter for other preprocedural examination: Secondary | ICD-10-CM

## 2016-01-10 DIAGNOSIS — K5900 Constipation, unspecified: Secondary | ICD-10-CM | POA: Diagnosis not present

## 2016-01-10 DIAGNOSIS — R001 Bradycardia, unspecified: Secondary | ICD-10-CM

## 2016-01-10 DIAGNOSIS — F411 Generalized anxiety disorder: Secondary | ICD-10-CM

## 2016-01-10 DIAGNOSIS — E039 Hypothyroidism, unspecified: Secondary | ICD-10-CM | POA: Diagnosis not present

## 2016-01-10 DIAGNOSIS — F17200 Nicotine dependence, unspecified, uncomplicated: Secondary | ICD-10-CM | POA: Diagnosis not present

## 2016-01-10 MED ORDER — CITALOPRAM HYDROBROMIDE 40 MG PO TABS: ORAL_TABLET | ORAL | 1 refills | Status: DC

## 2016-01-10 MED ORDER — LINACLOTIDE 72 MCG PO CAPS: 72.0000 ug | ORAL_CAPSULE | Freq: Every day | ORAL | 3 refills | Status: DC

## 2016-01-10 MED ORDER — ESCITALOPRAM OXALATE 10 MG PO TABS: 10.0000 mg | ORAL_TABLET | Freq: Every day | ORAL | 1 refills | Status: DC

## 2016-01-10 NOTE — Progress Notes (Signed)
Subjective:    Patient ID: Carolyn [name], female    DOB: [birth year], 65 y.o.   MRN: [id number]  HPI Pt presents to the office today for surgical clearance for surgery on her right thumb joint on 01/21/16. Pt denies any cardiac history. Pt denies any headache, palpitations, SOB, or edema at this time. Pt states [facility] is doing her surgery.   Pt is a current smoker. Admits to smoking a pack a day for the last 45+ years. Pt also has hypothyroidism and is currently taking levothyroxine. Pt's TSH was 4.9 in July. We will recheck today.   Pt is also complaining of increased anxiety. Pt is currently taking Klonopin 0.5 mg at bedtime. PT states she has excessive worry, anxiety, and decrease in interest.   She is also complaining of constipation that is daily. PT takes MOM and stool softeners daily with mild relief. Pt states she will have a BM once daily or every other day.     Review of Systems  Musculoskeletal: Positive for arthralgias.  All other systems reviewed and are negative.      Objective:   Physical Exam  Constitutional: She is oriented to person, place, and time. She appears well-developed and well-nourished. No distress.  HENT:  Head: Normocephalic and atraumatic.  Right Ear: External ear normal.  Left Ear: External ear normal.  Nose: Nose normal.  Mouth/Throat: Oropharynx is clear and moist.  Eyes: Pupils are equal, round, and reactive to light.  Neck: Normal range of motion. Neck supple. No thyromegaly present.  Cardiovascular: Normal rate, regular rhythm, normal heart sounds and intact distal pulses.   No murmur heard. Pulmonary/Chest: Effort normal. No respiratory distress. She has wheezes.  Abdominal: Soft. Bowel sounds are normal. She exhibits no distension. There is no tenderness.  Musculoskeletal: Normal range of motion. She exhibits edema and tenderness.  Enlarged thumb joint with decreased ROM of right thumb.   Neurological: She is alert and  oriented to person, place, and time. She has normal reflexes. No cranial nerve deficit.  Skin: Skin is warm and dry.  Psychiatric: She has a normal mood and affect. Her behavior is normal. Judgment and thought content normal.  Vitals reviewed.   BP 128/71   Pulse (!) 58   Temp 97.6 F (36.4 C) (Oral)   Ht '5\' 4"'  (1.626 m)   Wt 139 lb 3.2 oz (63.1 kg)   BMI 23.89 kg/m        Assessment & Plan:  1. Pre-op examination - DG Chest 2 View; Future - EKG 12-Lead - CMP14+EGFR - CBC with Differential/Platelet - Thyroid Panel With TSH  2. Hypothyroidism, unspecified type - CMP14+EGFR  3. GAD (generalized anxiety disorder) -Celexa increased to 40 mg  - CMP14+EGFR - citalopram (CELEXA) 40 MG tablet; TAKE ONE TABLET (20 MG TOTAL) BY MOUTH ONCE DAILY  Dispense: 90 tablet; Refill: 1  4. Constipation, unspecified constipation type -Pt started on linzess today -Force fluids - CMP14+EGFR - linaclotide (LINZESS) 72 MCG capsule; Take 1 capsule (72 mcg total) by mouth daily before breakfast.  Dispense: 30 capsule; Refill: 3  5. Moderate episode of recurrent major depressive disorder (HCC) -Celexa increased today to 40 mg  - citalopram (CELEXA) 40 MG tablet; TAKE ONE TABLET (20 MG TOTAL) BY MOUTH ONCE DAILY  Dispense: 90 tablet; Refill: 1  6. Bradycardia - Ambulatory referral to Cardiology  7. Current smoker -Smoking cessation discussed - Ambulatory referral to Cardiology   Continue all meds Labs pending Health Maintenance reviewed Diet  and exercise encouraged RTO 5 weeks to recheck GAD and Depression  Evelina Dun, FNP

## 2016-01-10 NOTE — Patient Instructions (Signed)

## 2016-01-11 ENCOUNTER — Encounter: Payer: Self-pay | Admitting: Family

## 2016-01-11 ENCOUNTER — Encounter: Payer: Self-pay | Admitting: Obstetrics and Gynecology

## 2016-01-11 LAB — CMP14+EGFR
ALBUMIN: 4.5 g/dL (ref 3.6–4.8)
ALT: 17 IU/L (ref 0–32)
AST: 24 IU/L (ref 0–40)
Albumin/Globulin Ratio: 2.1 (ref 1.2–2.2)
Alkaline Phosphatase: 66 IU/L (ref 39–117)
BUN/Creatinine Ratio: 10 — ABNORMAL LOW (ref 12–28)
BUN: 8 mg/dL (ref 8–27)
Bilirubin Total: 0.5 mg/dL (ref 0.0–1.2)
CALCIUM: 9.5 mg/dL (ref 8.7–10.3)
CO2: 25 mmol/L (ref 18–29)
CREATININE: 0.81 mg/dL (ref 0.57–1.00)
Chloride: 95 mmol/L — ABNORMAL LOW (ref 96–106)
GFR calc Af Amer: 90 mL/min/{1.73_m2} (ref >59)
GFR, EST NON AFRICAN AMERICAN: 78 mL/min/{1.73_m2} (ref >59)
GLOBULIN, TOTAL: 2.1 g/dL (ref 1.5–4.5)
Glucose: 88 mg/dL (ref 65–99)
Potassium: 5 mmol/L (ref 3.5–5.2)
SODIUM: 135 mmol/L (ref 134–144)
TOTAL PROTEIN: 6.6 g/dL (ref 6.0–8.5)

## 2016-01-11 LAB — CBC WITH DIFFERENTIAL/PLATELET
BASOS: 1 %
Basophils Absolute: 0 10*3/uL (ref 0.0–0.2)
EOS (ABSOLUTE): 0.1 10*3/uL (ref 0.0–0.4)
EOS: 2 %
HEMATOCRIT: 42.6 % (ref 34.0–46.6)
Hemoglobin: 14.7 g/dL (ref 11.1–15.9)
IMMATURE GRANS (ABS): 0 10*3/uL (ref 0.0–0.1)
IMMATURE GRANULOCYTES: 0 %
Lymphocytes Absolute: 1.5 10*3/uL (ref 0.7–3.1)
Lymphs: 24 %
MCH: 31.5 pg (ref 26.6–33.0)
MCHC: 34.5 g/dL (ref 31.5–35.7)
MCV: 91 fL (ref 79–97)
MONOS ABS: 0.5 10*3/uL (ref 0.1–0.9)
Monocytes: 7 %
NEUTROS PCT: 66 %
Neutrophils Absolute: 4.2 10*3/uL (ref 1.4–7.0)
Platelets: 193 10*3/uL (ref 150–379)
RBC: 4.67 x10E6/uL (ref 3.77–5.28)
RDW: 13.5 % (ref 12.3–15.4)
WBC: 6.4 10*3/uL (ref 3.4–10.8)

## 2016-01-11 LAB — THYROID PANEL WITH TSH
FREE THYROXINE INDEX: 2.2 (ref 1.2–4.9)
T3 UPTAKE RATIO: 29 % (ref 24–39)
T4, Total: 7.5 ug/dL (ref 4.5–12.0)
TSH: 3.44 u[IU]/mL (ref 0.450–4.500)

## 2016-01-13 ENCOUNTER — Other Ambulatory Visit: Payer: Self-pay | Admitting: Family

## 2016-01-13 ENCOUNTER — Telehealth: Payer: Self-pay | Admitting: Family

## 2016-01-13 DIAGNOSIS — I700 Atherosclerosis of aorta: Secondary | ICD-10-CM

## 2016-01-13 DIAGNOSIS — J441 Chronic obstructive pulmonary disease with (acute) exacerbation: Secondary | ICD-10-CM | POA: Insufficient documentation

## 2016-01-13 DIAGNOSIS — J449 Chronic obstructive pulmonary disease, unspecified: Secondary | ICD-10-CM

## 2016-01-13 NOTE — Telephone Encounter (Signed)
Being worked on

## 2016-01-14 ENCOUNTER — Telehealth: Payer: Self-pay | Admitting: Family

## 2016-01-15 ENCOUNTER — Telehealth: Payer: Self-pay | Admitting: Family

## 2016-01-15 ENCOUNTER — Telehealth: Payer: Self-pay | Admitting: General Practice

## 2016-01-15 NOTE — Telephone Encounter (Signed)
Office note, labs and EKG of 01-10-16 and cardiology appt faxed to [facility] @ the [place] [phone]

## 2016-01-15 NOTE — Telephone Encounter (Signed)
Per Dr Elly Modena, patient's pap was abnormal and needs colposcopy. Called patient & informed her of results & recommendations. Patient verbalized understanding and asked why this keeps happening because she has had multiple biopsies and a laser surgery. 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 Patient states she is having surgery next week and will be in a cast for quite some time. Told patient someone from the front office will contact her to set up an appt that works best for her. Patient verbalized understanding & had no questions.

## 2016-01-16 ENCOUNTER — Encounter: Payer: Self-pay | Admitting: Obstetrics and Gynecology

## 2016-01-16 NOTE — Progress Notes (Signed)
Cardiology Office Note    Date:  01/17/2016   ID:  Evannie Eisele, DOB [birth year], MRN [id number]  PCP:  Evelina Dun, FNP  Cardiologist:  New  CC: bradycardia   History of Present Illness:  Carolyn [name] is a 65 y.o. female with a history of hypothyroidism, anxiety, tobacco abuse and no prior cardiac history who presents to clinic for evaluation of bradycardia.   She was recently seen by her PCP on 01/10/16 for pre op clearance prior to orthopedic surgery on right thumb joint. ECG showed sinus bradycardia with HR 57 and she was referred to cardiology further work up.  Reviewed labs from PCP: CBC, BMET and Thyroid studies WNL.  Today she presents to clinic for evaluation. She has no past cardiac history but does have family history of CAD with a heart attack in her 76s. But not HTN, HLD or DMT2. She is very active in the yard and denies any chest pain or SOB with exertion. No pain legs with walking. No LE edema, orthopnea or PND. No dizziness or syncope.    Past Medical History:  Diagnosis Date  . Cervical cancer ([place])   . Depression   . Melanoma ([place])   . Thyroid disease     Past Surgical History:  Procedure Laterality Date  . CERVICAL CONIZATION W/BX    . MELANOMA EXCISION  08/2014   back  . SHOULDER SURGERY Right     Current Medications: Outpatient Medications Prior to Visit  Medication Sig Dispense Refill  . Calcium Carbonate (CALCIUM 600 PO) Take by mouth.    . citalopram (CELEXA) 40 MG tablet TAKE ONE TABLET (20 MG TOTAL) BY MOUTH ONCE DAILY 90 tablet 1  . clonazePAM (KLONOPIN) 0.5 MG tablet TAKE ONE TABLET BY MOUTH AT BEDTIME 30 tablet 1  . Cyanocobalamin (VITAMIN B 12 PO) Take 1,000 mcg by mouth daily.    . fluticasone (FLONASE) 50 MCG/ACT nasal spray Place 1 spray into both nostrils daily. 16 g 6  . Garlic 123XX123 MG CAPS Take by mouth.    . hydroxychloroquine (PLAQUENIL) 200 MG tablet Take 1 tablet (200 mg total) by mouth daily. 30 tablet 5  .  levothyroxine (SYNTHROID) 88 MCG tablet Take 1 tablet (88 mcg total) by mouth daily before breakfast. 90 tablet 1  . linaclotide (LINZESS) 72 MCG capsule Take 1 capsule (72 mcg total) by mouth daily before breakfast. 30 capsule 3  . Multiple Vitamins-Minerals (CENTRUM SILVER ADULT 50+ PO) Take 1 tablet by mouth daily.    . Omega-3 Fatty Acids (FISH OIL) 1000 MG CAPS Take by mouth.    . prenatal vitamin w/FE, FA (PRENATAL 1 + 1) 27-1 MG TABS tablet Take 1 tablet by mouth daily at 12 noon.     No facility-administered medications prior to visit.      Allergies:   Azithromycin   Social History   Social History  . Marital status: Divorced    Spouse name: N/A  . Number of children: N/A  . Years of education: N/A   Social History Main Topics  . Smoking status: Current Every Day Smoker    Packs/day: 1.00    Types: Cigarettes  . Smokeless tobacco: Never Used  . Alcohol use No  . Drug use: No  . Sexual activity: Yes    Partners: Male    Birth control/ protection: Post-menopausal   Other Topics Concern  . None   Social History Narrative  . None     Family History:  The  patient's family history includes Aneurysm in her mother; Heart disease in her father and mother; Kidney failure in her father.     ROS:   Please see the history of present illness.    ROS All other systems reviewed and are negative.   PHYSICAL EXAM:   VS:  BP 122/74   Pulse 67   Ht 5\' 4"  (1.626 m)   Wt 139 lb 12.8 oz (63.4 kg)   BMI 24.00 kg/m    GEN: Well nourished, well developed, in no acute distress  HEENT: normal  Neck: no JVD, carotid bruits, or masses Cardiac: RRR; no murmurs, rubs, or gallops,no edema  Respiratory:  clear to auscultation bilaterally, normal work of breathing GI: soft, nontender, nondistended, + BS MS: no deformity or atrophy  Skin: warm and dry, no rash Neuro:  Alert and Oriented x 3, Strength and sensation are intact Psych: euthymic mood, full affect  Wt Readings from Last  3 Encounters:  01/17/16 139 lb 12.8 oz (63.4 kg)  01/10/16 139 lb 3.2 oz (63.1 kg)  12/11/15 141 lb (64 kg)      Studies/Labs Reviewed:   EKG:  EKG is ordered today.  The ekg ordered today demonstrates NSR with HR 67   Recent Labs: 01/10/2016: ALT 17; BUN 8; Creatinine, Ser 0.81; Platelets 193; Potassium 5.0; Sodium 135; TSH 3.440   Lipid Panel    Component Value Date/Time   CHOL 192 10/10/2015 1434   TRIG 89 10/10/2015 1434   HDL 90 10/10/2015 1434   CHOLHDL 2.1 10/10/2015 1434   LDLCALC 84 10/10/2015 1434    Additional studies/ records that were reviewed today include:  none   ASSESSMENT & PLAN:   Sinus bradycardia: asymptomatic. ECG today with NSR with HR 67. No need for further work up .  Pre op clearance: her only RFs for CAD are heavy tobacco abuse and family hx (MI in her mother at age 63). This is not a high risk surgery (intraperitoneal, intrathoracic or suprainguinal) and she has no known CAD, CHF, CVD, DM or CKD. Using the Revisied Cardiac Risk Index Calculator she is considered "very low risk" with a 0.4% estimate rate of MI, pulm edema, VF, cardiac arrest or CHB. She will be cleared for surgery without further cardiac work up.    Medication Adjustments/Labs and Tests Ordered: Current medicines are reviewed at length with the patient today.  Concerns regarding medicines are outlined above.  Medication changes, Labs and Tests ordered today are listed in the Patient Instructions below. Patient Instructions  Medication Instructions:  Your physician recommends that you continue on your current medications as directed. Please refer to the Current Medication list given to you today.   Labwork: None ordered  Testing/Procedures: None ordered  Follow-Up: Your physician recommends that you schedule a follow-up appointment in: AS NEEDED   Any Other Special Instructions Will Be Listed Below (If Applicable). You have been cleared for surgery.  Good luck!    If  you need a refill on your cardiac medications before your next appointment, please call your pharmacy.      Signed, Angelena Form, PA-C  01/17/2016 9:36 AM    [facility] [address], [place], [place]  [address] Phone: [phone]; Fax: [phone]

## 2016-01-17 ENCOUNTER — Encounter: Payer: Self-pay | Admitting: Physician Assistant

## 2016-01-17 ENCOUNTER — Ambulatory Visit (INDEPENDENT_AMBULATORY_CARE_PROVIDER_SITE_OTHER): Payer: Medicare Other | Admitting: Physician Assistant

## 2016-01-17 VITALS — BP 122/74 | HR 67 | Ht 64.0 in | Wt 139.8 lb

## 2016-01-17 DIAGNOSIS — Z01818 Encounter for other preprocedural examination: Secondary | ICD-10-CM

## 2016-01-17 DIAGNOSIS — R001 Bradycardia, unspecified: Secondary | ICD-10-CM | POA: Diagnosis not present

## 2016-01-17 DIAGNOSIS — Z720 Tobacco use: Secondary | ICD-10-CM | POA: Diagnosis not present

## 2016-01-17 DIAGNOSIS — E039 Hypothyroidism, unspecified: Secondary | ICD-10-CM | POA: Diagnosis not present

## 2016-01-17 NOTE — Patient Instructions (Addendum)
Medication Instructions:  Your physician recommends that you continue on your current medications as directed. Please refer to the Current Medication list given to you today.   Labwork: None ordered  Testing/Procedures: None ordered  Follow-Up: Your physician recommends that you schedule a follow-up appointment in: AS NEEDED   Any Other Special Instructions Will Be Listed Below (If Applicable). You have been cleared for surgery.  Good luck!    If you need a refill on your cardiac medications before your next appointment, please call your pharmacy.

## 2016-01-20 DIAGNOSIS — D485 Neoplasm of uncertain behavior of skin: Secondary | ICD-10-CM | POA: Diagnosis not present

## 2016-01-21 DIAGNOSIS — M1811 Unilateral primary osteoarthritis of first carpometacarpal joint, right hand: Secondary | ICD-10-CM | POA: Diagnosis not present

## 2016-01-21 DIAGNOSIS — G8918 Other acute postprocedural pain: Secondary | ICD-10-CM | POA: Diagnosis not present

## 2016-01-23 NOTE — Telephone Encounter (Signed)
Pt had wrong providers office.

## 2016-02-05 DIAGNOSIS — Z4789 Encounter for other orthopedic aftercare: Secondary | ICD-10-CM | POA: Diagnosis not present

## 2016-02-05 DIAGNOSIS — M1811 Unilateral primary osteoarthritis of first carpometacarpal joint, right hand: Secondary | ICD-10-CM | POA: Diagnosis not present

## 2016-02-10 DIAGNOSIS — D485 Neoplasm of uncertain behavior of skin: Secondary | ICD-10-CM | POA: Diagnosis not present

## 2016-02-10 DIAGNOSIS — C44319 Basal cell carcinoma of skin of other parts of face: Secondary | ICD-10-CM | POA: Diagnosis not present

## 2016-02-14 ENCOUNTER — Ambulatory Visit: Payer: Medicare Other | Admitting: Family

## 2016-02-19 DIAGNOSIS — M1811 Unilateral primary osteoarthritis of first carpometacarpal joint, right hand: Secondary | ICD-10-CM | POA: Diagnosis not present

## 2016-02-19 DIAGNOSIS — Z4789 Encounter for other orthopedic aftercare: Secondary | ICD-10-CM | POA: Diagnosis not present

## 2016-03-04 ENCOUNTER — Ambulatory Visit (INDEPENDENT_AMBULATORY_CARE_PROVIDER_SITE_OTHER): Payer: Medicare Other | Admitting: Obstetrics and Gynecology

## 2016-03-04 ENCOUNTER — Encounter: Payer: Self-pay | Admitting: Obstetrics and Gynecology

## 2016-03-04 ENCOUNTER — Other Ambulatory Visit (HOSPITAL_COMMUNITY)
Admission: RE | Admit: 2016-03-04 | Discharge: 2016-03-04 | Disposition: A | Discharge status: Home / Self Care | Payer: Medicare Other | Source: Ambulatory Visit | Attending: Obstetrics and Gynecology | Admitting: Obstetrics and Gynecology

## 2016-03-04 VITALS — BP 121/67 | HR 68 | Wt 134.3 lb

## 2016-03-04 DIAGNOSIS — R87610 Atypical squamous cells of undetermined significance on cytologic smear of cervix (ASC-US): Secondary | ICD-10-CM

## 2016-03-04 DIAGNOSIS — M1811 Unilateral primary osteoarthritis of first carpometacarpal joint, right hand: Secondary | ICD-10-CM | POA: Diagnosis not present

## 2016-03-04 DIAGNOSIS — R87810 Cervical high risk human papillomavirus (HPV) DNA test positive: Secondary | ICD-10-CM | POA: Diagnosis not present

## 2016-03-04 DIAGNOSIS — Z4789 Encounter for other orthopedic aftercare: Secondary | ICD-10-CM | POA: Diagnosis not present

## 2016-03-04 DIAGNOSIS — A630 Anogenital (venereal) warts: Secondary | ICD-10-CM | POA: Diagnosis not present

## 2016-03-04 NOTE — Progress Notes (Signed)
65 yo with abnormal pap smear here for colposcopy  Patient given informed consent, signed copy in the chart, time out was performed.  Placed in lithotomy position. Cervix viewed with speculum and colposcope after application of acetic acid.   Colposcopy adequate?  No TZ not visualized Acetowhite lesions? Yes at 3 o'clock Punctation? no Mosaicism?  no Abnormal vasculature?  no Biopsies? Yes at 3 o'clock ECC? Yes but very shallow cervix  COMMENTS:  Patient was given post procedure instructions.  She will return in 2 weeks for results.  Mora Bellman, MD

## 2016-03-05 ENCOUNTER — Other Ambulatory Visit: Payer: Self-pay | Admitting: Family Medicine

## 2016-03-05 DIAGNOSIS — F411 Generalized anxiety disorder: Secondary | ICD-10-CM

## 2016-03-05 DIAGNOSIS — F32A Depression, unspecified: Secondary | ICD-10-CM

## 2016-03-05 DIAGNOSIS — F329 Major depressive disorder, single episode, unspecified: Secondary | ICD-10-CM

## 2016-03-06 NOTE — Telephone Encounter (Signed)
Refill called to [facility] for pt NTBS

## 2016-03-06 NOTE — Telephone Encounter (Signed)
Go ahead and call in 30 days with no refills, needs office visit before next refill

## 2016-03-09 ENCOUNTER — Telehealth: Payer: Self-pay

## 2016-03-09 NOTE — Telephone Encounter (Signed)
Please inform patient of negative colposcopy. There is no need for cryotherapy at this time. Plan is for repeat pap smear next year. Attempted to call several times but no answer or voicemail.

## 2016-03-12 DIAGNOSIS — M1811 Unilateral primary osteoarthritis of first carpometacarpal joint, right hand: Secondary | ICD-10-CM | POA: Diagnosis not present

## 2016-03-16 NOTE — Telephone Encounter (Signed)
Patient has been informed of negative colposcopy results.

## 2016-03-19 ENCOUNTER — Other Ambulatory Visit: Payer: Self-pay | Admitting: Dermatology

## 2016-03-19 ENCOUNTER — Other Ambulatory Visit: Payer: Self-pay | Admitting: Family

## 2016-03-19 ENCOUNTER — Encounter: Payer: Self-pay | Admitting: Family

## 2016-03-19 DIAGNOSIS — C44622 Squamous cell carcinoma of skin of right upper limb, including shoulder: Secondary | ICD-10-CM | POA: Diagnosis not present

## 2016-03-19 DIAGNOSIS — D0461 Carcinoma in situ of skin of right upper limb, including shoulder: Secondary | ICD-10-CM | POA: Diagnosis not present

## 2016-03-19 DIAGNOSIS — M1811 Unilateral primary osteoarthritis of first carpometacarpal joint, right hand: Secondary | ICD-10-CM | POA: Diagnosis not present

## 2016-03-19 DIAGNOSIS — C44629 Squamous cell carcinoma of skin of left upper limb, including shoulder: Secondary | ICD-10-CM | POA: Diagnosis not present

## 2016-03-19 DIAGNOSIS — L570 Actinic keratosis: Secondary | ICD-10-CM | POA: Diagnosis not present

## 2016-03-19 DIAGNOSIS — C44519 Basal cell carcinoma of skin of other part of trunk: Secondary | ICD-10-CM | POA: Diagnosis not present

## 2016-03-19 MED ORDER — NICOTINE 21 MG/24HR TD PT24: 21.0000 mg | MEDICATED_PATCH | Freq: Every day | TRANSDERMAL | 2 refills | Status: DC

## 2016-03-20 ENCOUNTER — Encounter: Payer: Self-pay | Admitting: Family

## 2016-04-01 DIAGNOSIS — Z4789 Encounter for other orthopedic aftercare: Secondary | ICD-10-CM | POA: Diagnosis not present

## 2016-04-01 DIAGNOSIS — M1811 Unilateral primary osteoarthritis of first carpometacarpal joint, right hand: Secondary | ICD-10-CM | POA: Diagnosis not present

## 2016-04-07 ENCOUNTER — Other Ambulatory Visit: Payer: Self-pay | Admitting: Family

## 2016-04-07 ENCOUNTER — Encounter: Payer: Self-pay | Admitting: Family

## 2016-04-07 ENCOUNTER — Other Ambulatory Visit: Payer: Self-pay | Admitting: Family Medicine

## 2016-04-07 DIAGNOSIS — F32A Depression, unspecified: Secondary | ICD-10-CM

## 2016-04-07 DIAGNOSIS — F329 Major depressive disorder, single episode, unspecified: Secondary | ICD-10-CM

## 2016-04-07 DIAGNOSIS — F411 Generalized anxiety disorder: Secondary | ICD-10-CM

## 2016-04-07 NOTE — Telephone Encounter (Signed)
Last filled 12/2. Last seen 01/2016

## 2016-04-07 NOTE — Telephone Encounter (Signed)
Please call in klonopin with 0 refills 

## 2016-04-13 ENCOUNTER — Ambulatory Visit (INDEPENDENT_AMBULATORY_CARE_PROVIDER_SITE_OTHER): Payer: Medicare Other | Admitting: Family

## 2016-04-13 ENCOUNTER — Encounter: Payer: Self-pay | Admitting: Family

## 2016-04-13 VITALS — BP 136/75 | HR 61 | Temp 97.8°F | Ht 64.0 in | Wt 143.0 lb

## 2016-04-13 DIAGNOSIS — F411 Generalized anxiety disorder: Secondary | ICD-10-CM | POA: Diagnosis not present

## 2016-04-13 DIAGNOSIS — J449 Chronic obstructive pulmonary disease, unspecified: Secondary | ICD-10-CM | POA: Diagnosis not present

## 2016-04-13 DIAGNOSIS — E039 Hypothyroidism, unspecified: Secondary | ICD-10-CM | POA: Diagnosis not present

## 2016-04-13 DIAGNOSIS — F331 Major depressive disorder, recurrent, moderate: Secondary | ICD-10-CM

## 2016-04-13 DIAGNOSIS — K5900 Constipation, unspecified: Secondary | ICD-10-CM | POA: Insufficient documentation

## 2016-04-13 DIAGNOSIS — G2581 Restless legs syndrome: Secondary | ICD-10-CM | POA: Diagnosis not present

## 2016-04-13 DIAGNOSIS — J301 Allergic rhinitis due to pollen: Secondary | ICD-10-CM | POA: Diagnosis not present

## 2016-04-13 DIAGNOSIS — I700 Atherosclerosis of aorta: Secondary | ICD-10-CM | POA: Diagnosis not present

## 2016-04-13 MED ORDER — CLONAZEPAM 0.5 MG PO TABS: 0.5000 mg | ORAL_TABLET | Freq: Every day | ORAL | 2 refills | Status: DC

## 2016-04-13 MED ORDER — ROPINIROLE HCL 2 MG PO TABS: 2.0000 mg | ORAL_TABLET | Freq: Every day | ORAL | 1 refills | Status: DC

## 2016-04-13 NOTE — Progress Notes (Signed)
Subjective:    Patient ID: Carolyn [name], female    DOB: 05-Sep-1950, 66 y.o.   MRN: [id number]  PT presents to the office today to for chronic follow up.  Anxiety  Presents for follow-up visit. Symptoms include depressed mood, excessive worry, irritability, nervous/anxious behavior and restlessness. Patient reports no palpitations, panic, shortness of breath or suicidal ideas. Symptoms occur occasionally. The quality of sleep is good.    Depression         This is a chronic problem.  The current episode started more than 1 year ago.   The onset quality is gradual.   The problem has been resolved since onset.  Associated symptoms include restlessness, decreased interest and sad.  Associated symptoms include no helplessness, no hopelessness, no headaches and no suicidal ideas.  Past treatments include SSRIs - Selective serotonin reuptake inhibitors.  Compliance with treatment is good.  Past medical history includes thyroid problem and anxiety.   Thyroid Problem  Presents for follow-up visit. Symptoms include anxiety, depressed mood and dry skin. Patient reports no constipation, diarrhea, hair loss, palpitations, visual change or weight gain. The symptoms have been stable. Past treatments include levothyroxine. The treatment provided significant relief.  Constipation  This is a chronic problem. The current episode started more than 1 year ago. Her stool frequency is 4 to 5 times per week. Pertinent negatives include no diarrhea or fecal incontinence. She has tried laxatives for the symptoms. The treatment provided moderate relief.  RLS Pt currently taking plaquenil 200 mg every night at bedtime and Klonopin 0.5 mg at bedtime. Pt states this is working well, but states the plaquenil is expensive.  COPD Pt currently smoking 1 pack every 2 days. States she is trying to cut back. States this is stable.   Review of Systems  Constitutional: Positive for irritability. Negative for weight gain.    HENT: Negative.   Eyes: Negative.   Respiratory: Negative.  Negative for shortness of breath.   Cardiovascular: Negative.  Negative for palpitations.  Gastrointestinal: Negative for constipation and diarrhea.  Endocrine: Negative.   Genitourinary: Negative.   Musculoskeletal: Negative.   Neurological: Negative.  Negative for headaches.  Hematological: Negative.   Psychiatric/Behavioral: Positive for depression. Negative for suicidal ideas. The patient is nervous/anxious.   All other systems reviewed and are negative.      Objective:   Physical Exam  Constitutional: She is oriented to person, place, and time. She appears well-developed and well-nourished. No distress.  HENT:  Head: Normocephalic and atraumatic.  Right Ear: External ear normal.  Left Ear: External ear normal.  Nose: Nose normal.  Mouth/Throat: Oropharynx is clear and moist.      Eyes: Pupils are equal, round, and reactive to light.  Neck: Normal range of motion. Neck supple. No thyromegaly present.  Cardiovascular: Normal rate, regular rhythm and intact distal pulses.   Murmur heard. Pulmonary/Chest: Effort normal and breath sounds normal. No respiratory distress. She has no wheezes.  Diminished breath sounds  Abdominal: Soft. Bowel sounds are normal. She exhibits no distension. There is no tenderness.  Musculoskeletal: Normal range of motion. She exhibits no edema or tenderness.  Neurological: She is alert and oriented to person, place, and time.  Skin: Skin is warm and dry.  Psychiatric: She has a normal mood and affect. Her behavior is normal. Judgment and thought content normal.  Vitals reviewed.   BP 136/75   Pulse 61   Temp 97.8 F (36.6 C) (Oral)   Ht '5\' 4"'  (1.626  m)   Wt 143 lb (64.9 kg)   BMI 24.55 kg/m       Assessment & Plan:  1. Chronic allergic rhinitis due to pollen, unspecified seasonality - CMP14+EGFR  2. Moderate episode of recurrent major depressive disorder (HCC) -  CMP14+EGFR - clonazePAM (KLONOPIN) 0.5 MG tablet; Take 1 tablet (0.5 mg total) by mouth at bedtime.  Dispense: 90 tablet; Refill: 2  3. GAD (generalized anxiety disorder) - CMP14+EGFR - clonazePAM (KLONOPIN) 0.5 MG tablet; Take 1 tablet (0.5 mg total) by mouth at bedtime.  Dispense: 90 tablet; Refill: 2  4. Hypothyroidism, unspecified type - CMP14+EGFR - Thyroid Panel With TSH  5. Chronic obstructive pulmonary disease, unspecified COPD type ([place]) - CMP14+EGFR  6. Aortic atherosclerosis (HCC) - CMP14+EGFR  7. Restless leg syndrome Pt's plaquenil stopped and will try Requip today - CMP14+EGFR - rOPINIRole (REQUIP) 2 MG tablet; Take 1 tablet (2 mg total) by mouth at bedtime.  Dispense: 90 tablet; Refill: 1  8. Constipation, unspecified constipation type - CMP14+EGFR   Continue all meds Labs pending Health Maintenance reviewed Diet and exercise encouraged RTO 6 months   Evelina Dun, FNP

## 2016-04-13 NOTE — Patient Instructions (Addendum)
Restless Legs Syndrome Introduction Restless legs syndrome is a condition that causes uncomfortable feelings or sensations in the legs, especially while sitting or lying down. The sensations usually cause an overwhelming urge to move the legs. The arms can also sometimes be affected. The condition can range from mild to severe. The symptoms often interfere with a person's ability to sleep. What are the causes? The cause of this condition is not known. What increases the risk? This condition is more likely to develop in:  People who are older than age 53.  Pregnant women. In general, restless legs syndrome is more common in women than in men.  People who have a family history of the condition.  People who have certain medical conditions, such as iron deficiency, kidney disease, Parkinson disease, or nerve damage.  People who take certain medicines, such as medicines for high blood pressure, nausea, colds, allergies, depression, and some heart conditions. What are the signs or symptoms? The main symptom of this condition is uncomfortable sensations in the legs. These sensations may be:  Described as pulling, tingling, prickling, throbbing, crawling, or burning.  Worse while you are sitting or lying down.  Worse during periods of rest or inactivity.  Worse at night, often interfering with your sleep.  Accompanied by a very strong urge to move your legs.  Temporarily relieved by movement of your legs. The sensations usually affect both sides of the body. The arms can also be affected, but this is rare. People who have this condition often have tiredness during the day because of their lack of sleep at night. How is this diagnosed? This condition may be diagnosed based on your description of the symptoms. You may also have tests, including blood tests, to check for other conditions that may lead to your symptoms. In some cases, you may be asked to spend some time in a sleep lab so your  sleeping can be monitored. How is this treated? Treatment for this condition is focused on managing the symptoms. Treatment may include:  Self-help and lifestyle changes.  Medicines. Follow these instructions at home:  Take medicines only as directed by your health care provider.  Try these methods to get temporary relief from the uncomfortable sensations:  Massage your legs.  Walk or stretch.  Take a cold or hot bath.  Practice good sleep habits. For example, go to bed and get up at the same time every day.  Exercise regularly.  Practice ways of relaxing, such as yoga or meditation.  Avoid caffeine and alcohol.  Do not use any tobacco products, including cigarettes, chewing tobacco, or electronic cigarettes. If you need help quitting, ask your health care provider.  Keep all follow-up visits as directed by your health care provider. This is important. Contact a health care provider if: Your symptoms do not improve with treatment, or they get worse. This information is not intended to replace advice given to you by your health care provider. Make sure you discuss any questions you have with your health care provider. Document Released: 03/13/2002 Document Revised: 08/29/2015 Document Reviewed: 03/19/2014  2017 Elsevier

## 2016-04-14 LAB — CMP14+EGFR
ALK PHOS: 64 IU/L (ref 39–117)
ALT: 14 IU/L (ref 0–32)
AST: 20 IU/L (ref 0–40)
Albumin/Globulin Ratio: 1.9 (ref 1.2–2.2)
Albumin: 4.3 g/dL (ref 3.6–4.8)
BUN/Creatinine Ratio: 12 (ref 12–28)
BUN: 8 mg/dL (ref 8–27)
Bilirubin Total: 0.3 mg/dL (ref 0.0–1.2)
CALCIUM: 9.4 mg/dL (ref 8.7–10.3)
CO2: 24 mmol/L (ref 18–29)
CREATININE: 0.68 mg/dL (ref 0.57–1.00)
Chloride: 93 mmol/L — ABNORMAL LOW (ref 96–106)
GFR calc Af Amer: 108 mL/min/{1.73_m2} (ref >59)
GFR, EST NON AFRICAN AMERICAN: 93 mL/min/{1.73_m2} (ref >59)
GLUCOSE: 76 mg/dL (ref 65–99)
Globulin, Total: 2.3 g/dL (ref 1.5–4.5)
Potassium: 4.6 mmol/L (ref 3.5–5.2)
Sodium: 132 mmol/L — ABNORMAL LOW (ref 134–144)
Total Protein: 6.6 g/dL (ref 6.0–8.5)

## 2016-04-14 LAB — THYROID PANEL WITH TSH
Free Thyroxine Index: 2.1 (ref 1.2–4.9)
T3 UPTAKE RATIO: 29 % (ref 24–39)
T4 TOTAL: 7.4 ug/dL (ref 4.5–12.0)
TSH: 2.66 u[IU]/mL (ref 0.450–4.500)

## 2016-04-29 DIAGNOSIS — Z4789 Encounter for other orthopedic aftercare: Secondary | ICD-10-CM | POA: Diagnosis not present

## 2016-04-29 DIAGNOSIS — M1711 Unilateral primary osteoarthritis, right knee: Secondary | ICD-10-CM | POA: Diagnosis not present

## 2016-04-29 DIAGNOSIS — M1811 Unilateral primary osteoarthritis of first carpometacarpal joint, right hand: Secondary | ICD-10-CM | POA: Diagnosis not present

## 2016-05-04 DIAGNOSIS — C44311 Basal cell carcinoma of skin of nose: Secondary | ICD-10-CM | POA: Diagnosis not present

## 2016-06-03 ENCOUNTER — Other Ambulatory Visit: Payer: Self-pay | Admitting: Dermatology

## 2016-06-03 DIAGNOSIS — L570 Actinic keratosis: Secondary | ICD-10-CM | POA: Diagnosis not present

## 2016-06-03 DIAGNOSIS — D229 Melanocytic nevi, unspecified: Secondary | ICD-10-CM | POA: Diagnosis not present

## 2016-06-03 DIAGNOSIS — Z85820 Personal history of malignant melanoma of skin: Secondary | ICD-10-CM | POA: Diagnosis not present

## 2016-06-03 DIAGNOSIS — L821 Other seborrheic keratosis: Secondary | ICD-10-CM | POA: Diagnosis not present

## 2016-06-03 DIAGNOSIS — D492 Neoplasm of unspecified behavior of bone, soft tissue, and skin: Secondary | ICD-10-CM | POA: Diagnosis not present

## 2016-06-03 DIAGNOSIS — C44519 Basal cell carcinoma of skin of other part of trunk: Secondary | ICD-10-CM | POA: Diagnosis not present

## 2016-06-11 ENCOUNTER — Other Ambulatory Visit: Payer: Self-pay | Admitting: Family

## 2016-06-11 DIAGNOSIS — F331 Major depressive disorder, recurrent, moderate: Secondary | ICD-10-CM

## 2016-06-11 DIAGNOSIS — F411 Generalized anxiety disorder: Secondary | ICD-10-CM

## 2016-06-12 MED ORDER — CITALOPRAM HYDROBROMIDE 40 MG PO TABS: ORAL_TABLET | ORAL | 0 refills | Status: DC

## 2016-06-12 NOTE — Telephone Encounter (Signed)
done

## 2016-06-18 ENCOUNTER — Telehealth: Payer: Self-pay | Admitting: Family

## 2016-06-18 ENCOUNTER — Ambulatory Visit (INDEPENDENT_AMBULATORY_CARE_PROVIDER_SITE_OTHER): Payer: Medicare Other | Admitting: Family

## 2016-06-18 ENCOUNTER — Encounter: Payer: Self-pay | Admitting: Family

## 2016-06-18 VITALS — BP 139/80 | HR 66 | Temp 97.0°F | Ht 64.0 in | Wt 145.8 lb

## 2016-06-18 DIAGNOSIS — F411 Generalized anxiety disorder: Secondary | ICD-10-CM

## 2016-06-18 DIAGNOSIS — F331 Major depressive disorder, recurrent, moderate: Secondary | ICD-10-CM

## 2016-06-18 DIAGNOSIS — J0100 Acute maxillary sinusitis, unspecified: Secondary | ICD-10-CM

## 2016-06-18 MED ORDER — CITALOPRAM HYDROBROMIDE 40 MG PO TABS: ORAL_TABLET | ORAL | 0 refills | Status: DC

## 2016-06-18 MED ORDER — CITALOPRAM HYDROBROMIDE 40 MG PO TABS: 40.0000 mg | ORAL_TABLET | Freq: Every day | ORAL | 0 refills | Status: DC

## 2016-06-18 MED ORDER — AMOXICILLIN-POT CLAVULANATE 875-125 MG PO TABS: 1.0000 | ORAL_TABLET | Freq: Two times a day (BID) | ORAL | 0 refills | Status: DC

## 2016-06-18 NOTE — Telephone Encounter (Signed)
rx fixed and is placed on file = she will run out early of the 20 mg

## 2016-06-18 NOTE — Addendum Note (Signed)
Addended by: Evelina Dun A on: 06/18/2016 12:03 PM   Modules accepted: Orders

## 2016-06-18 NOTE — Patient Instructions (Signed)

## 2016-06-18 NOTE — Progress Notes (Signed)
   Subjective:    Patient ID: Carolyn [name], female    DOB: 09-04-1950, 66 y.o.   MRN: [id number]  Sinus Problem  This is a new problem. The current episode started 1 to 4 weeks ago. The problem has been waxing and waning since onset. There has been no fever. Her pain is at a severity of 5/10. The pain is moderate. Associated symptoms include chills, congestion, coughing, ear pain, headaches, a hoarse voice, sinus pressure and sneezing. Pertinent negatives include no sore throat. Past treatments include oral decongestants. The treatment provided mild relief.      Review of Systems  Constitutional: Positive for chills.  HENT: Positive for congestion, ear pain, hoarse voice, sinus pressure and sneezing. Negative for sore throat.   Respiratory: Positive for cough.   Neurological: Positive for headaches.  All other systems reviewed and are negative.      Objective:   Physical Exam  Constitutional: She is oriented to person, place, and time. She appears well-developed and well-nourished. No distress.  HENT:  Head: Normocephalic and atraumatic.  Right Ear: External ear normal.  Left Ear: External ear normal.  Nose: Mucosal edema and rhinorrhea present. Right sinus exhibits maxillary sinus tenderness. Left sinus exhibits maxillary sinus tenderness.  Mouth/Throat: Posterior oropharyngeal erythema present.  Eyes: Pupils are equal, round, and reactive to light.  Neck: Normal range of motion. Neck supple. No thyromegaly present.  Cardiovascular: Normal rate, regular rhythm, normal heart sounds and intact distal pulses.   No murmur heard. Pulmonary/Chest: Effort normal. No respiratory distress. She has wheezes in the left middle field.  Abdominal: Soft. Bowel sounds are normal. She exhibits no distension. There is no tenderness.  Musculoskeletal: Normal range of motion. She exhibits no edema or tenderness.  Neurological: She is alert and oriented to person, place, and time. She has normal  reflexes. No cranial nerve deficit.  Skin: Skin is warm and dry.  Psychiatric: She has a normal mood and affect. Her behavior is normal. Judgment and thought content normal.  Vitals reviewed.     BP 139/80   Pulse 66   Temp 97 F (36.1 C) (Oral)   Ht 5\' 4"  (1.626 m)   Wt 145 lb 12.8 oz (66.1 kg)   BMI 25.03 kg/m      Assessment & Plan:  1. Acute maxillary sinusitis, recurrence not specified - Take meds as prescribed - Use a cool mist humidifier  -Use saline nose sprays frequently -Saline irrigations of the nose can be very helpful if done frequently.  * 4X daily for 1 week*  * Use of a nettie pot can be helpful with this. Follow directions with this* -Force fluids -For any cough or congestion  Use plain Mucinex- regular strength or max strength is fine   * Children- consult with Pharmacist for dosing -For fever or aces or pains- take tylenol or ibuprofen appropriate for age and weight.  * for fevers greater than 101 orally you may alternate ibuprofen and tylenol every  3 hours. -Throat lozenges if help -New toothbrush in 3 days - amoxicillin-clavulanate (AUGMENTIN) 875-125 MG tablet; Take 1 tablet by mouth 2 (two) times daily.  Dispense: 14 tablet; Refill: 0   Evelina Dun, FNP

## 2016-06-25 DIAGNOSIS — L570 Actinic keratosis: Secondary | ICD-10-CM | POA: Diagnosis not present

## 2016-06-25 DIAGNOSIS — C44519 Basal cell carcinoma of skin of other part of trunk: Secondary | ICD-10-CM | POA: Diagnosis not present

## 2016-06-30 ENCOUNTER — Telehealth: Payer: Self-pay | Admitting: Family

## 2016-06-30 MED ORDER — ROPINIROLE HCL 3 MG PO TABS: 3.0000 mg | ORAL_TABLET | Freq: Every day | ORAL | 1 refills | Status: DC

## 2016-06-30 NOTE — Telephone Encounter (Signed)
Increased her requip to 3 mg from 2 mg

## 2016-06-30 NOTE — Telephone Encounter (Signed)
Patient aware that requip was increased to 3mg 

## 2016-07-15 ENCOUNTER — Telehealth: Payer: Self-pay

## 2016-07-15 DIAGNOSIS — R911 Solitary pulmonary nodule: Secondary | ICD-10-CM | POA: Insufficient documentation

## 2016-07-15 NOTE — Telephone Encounter (Signed)
Called and spoke with patient. I ordered a CT of chest to reevaluate pulmonary nodule.

## 2016-07-15 NOTE — Telephone Encounter (Signed)
Patient states that she threw away her FOBT since she got a colonoscopy in June and everything is okay. Patient was upset because we have not received her records from [place] and states that she was supposed to have a repeat CT a year ago and has not since we have not received the records. Advised patient to call that office and ask them to re send all her records to [place] since we have not received them. Patient verbalized understanding.

## 2016-07-15 NOTE — Addendum Note (Signed)
Addended by: Evelina Dun A on: 07/15/2016 04:00 PM   Modules accepted: Orders

## 2016-07-15 NOTE — Telephone Encounter (Signed)
-----   Message from Sharion Balloon, [place] sent at 07/15/2016  8:18 AM EDT ----- Please call pt to remind her to bring FOBT.  Thanks ----- Message ----- From: SYSTEM Sent: 07/15/2016  12:05 AM To: Sharion Balloon, FNP

## 2016-07-24 ENCOUNTER — Telehealth: Payer: Self-pay | Admitting: *Deleted

## 2016-07-24 ENCOUNTER — Ambulatory Visit (HOSPITAL_COMMUNITY)
Admission: RE | Admit: 2016-07-24 | Discharge: 2016-07-24 | Disposition: A | Discharge status: Home / Self Care | Payer: Medicare Other | Source: Ambulatory Visit | Attending: Family | Admitting: Family

## 2016-07-24 DIAGNOSIS — R918 Other nonspecific abnormal finding of lung field: Secondary | ICD-10-CM | POA: Insufficient documentation

## 2016-07-24 DIAGNOSIS — J929 Pleural plaque without asbestos: Secondary | ICD-10-CM | POA: Insufficient documentation

## 2016-07-24 DIAGNOSIS — R911 Solitary pulmonary nodule: Secondary | ICD-10-CM

## 2016-07-24 LAB — POCT I-STAT CREATININE: Creatinine, Ser: 0.7 mg/dL (ref 0.44–1.00)

## 2016-07-24 MED ORDER — IOPAMIDOL (ISOVUE-300) INJECTION 61%
INTRAVENOUS | Status: AC
  Administered 2016-07-24: 75 mL
  Filled 2016-07-24: qty 75

## 2016-07-24 NOTE — Telephone Encounter (Signed)
Pt having CT w/contrast Needed order for creatinine Order entered in [place], [place] per [employer]

## 2016-07-27 ENCOUNTER — Other Ambulatory Visit: Payer: Self-pay | Admitting: Family

## 2016-08-04 ENCOUNTER — Ambulatory Visit (INDEPENDENT_AMBULATORY_CARE_PROVIDER_SITE_OTHER): Payer: Medicare Other | Admitting: Family

## 2016-08-04 ENCOUNTER — Encounter: Payer: Self-pay | Admitting: Family

## 2016-08-04 VITALS — BP 128/75 | HR 61 | Temp 97.0°F | Ht 64.0 in | Wt 147.0 lb

## 2016-08-04 DIAGNOSIS — M792 Neuralgia and neuritis, unspecified: Secondary | ICD-10-CM | POA: Diagnosis not present

## 2016-08-04 DIAGNOSIS — G2581 Restless legs syndrome: Secondary | ICD-10-CM | POA: Diagnosis not present

## 2016-08-04 DIAGNOSIS — R252 Cramp and spasm: Secondary | ICD-10-CM | POA: Diagnosis not present

## 2016-08-04 DIAGNOSIS — L739 Follicular disorder, unspecified: Secondary | ICD-10-CM | POA: Diagnosis not present

## 2016-08-04 MED ORDER — CEPHALEXIN 500 MG PO CAPS: 500.0000 mg | ORAL_CAPSULE | Freq: Two times a day (BID) | ORAL | 0 refills | Status: DC

## 2016-08-04 MED ORDER — BACLOFEN 20 MG PO TABS: 20.0000 mg | ORAL_TABLET | Freq: Three times a day (TID) | ORAL | 0 refills | Status: DC

## 2016-08-04 MED ORDER — GABAPENTIN 300 MG PO CAPS: 300.0000 mg | ORAL_CAPSULE | Freq: Three times a day (TID) | ORAL | 3 refills | Status: DC

## 2016-08-04 NOTE — Progress Notes (Signed)
   Subjective:    Patient ID: Carolyn [name], female    DOB: 1951-03-19, 66 y.o.   MRN: [id number]  PT presents to the office today with recurrent restless leg syndrome. PT states this is worse and can not sleep and feels like "bugs crawling on my legs". PT reports constant aching, tingling, jumping pain of  10 out 10 in bilateral legs. Pt states she is having new leg and feet cramps.  Rash  This is a recurrent problem. The current episode started more than 1 month ago. The problem has been waxing and waning since onset. The affected locations include the left buttock. The rash is characterized by itchiness, dryness and redness. She was exposed to nothing. Past treatments include topical steroids, cold compress, anti-itch cream and antibiotic cream. The treatment provided mild relief.      Review of Systems  Musculoskeletal: Positive for arthralgias and myalgias.  Skin: Positive for rash.  All other systems reviewed and are negative.      Objective:   Physical Exam  Constitutional: She is oriented to person, place, and time. She appears well-developed and well-nourished. No distress.  HENT:  Head: Normocephalic.  Eyes: Pupils are equal, round, and reactive to light.  Neck: Normal range of motion. Neck supple. No thyromegaly present.  Cardiovascular: Normal rate, regular rhythm, normal heart sounds and intact distal pulses.   No murmur heard. Pulmonary/Chest: Effort normal and breath sounds normal. No respiratory distress. She has no wheezes.  Abdominal: Soft. Bowel sounds are normal. She exhibits no distension. There is no tenderness.  Musculoskeletal: Normal range of motion. She exhibits no edema or tenderness.  Bilateral leg tenderness  Neurological: She is alert and oriented to person, place, and time.  Skin: Skin is warm and dry. Rash noted.  Left buttocks erythemas pustula rash   Psychiatric: She has a normal mood and affect. Her behavior is normal. Judgment and thought  content normal.  Vitals reviewed.    BP 128/75   Pulse 61   Temp 97 F (36.1 C) (Oral)   Ht 5\' 4"  (1.626 m)   Wt 147 lb (66.7 kg)   BMI 25.23 kg/m      Assessment & Plan:  1. Restless leg syndrome -Continue Requip - gabapentin (NEURONTIN) 300 MG capsule; Take 1 capsule (300 mg total) by mouth 3 (three) times daily.  Dispense: 90 capsule; Refill: 3  2. Muscle cramp -Force fluids Will start baclofen as needed for muscle spams - baclofen (LIORESAL) 20 MG tablet; Take 1 tablet (20 mg total) by mouth 3 (three) times daily.  Dispense: 30 each; Refill: 0  3. Peripheral neuropathic pain Will start gabapentin 300 mg TID- PT to take one tablet on day 1, then BID on day two, and TID on day 3 - gabapentin (NEURONTIN) 300 MG capsule; Take 1 capsule (300 mg total) by mouth 3 (three) times daily.  Dispense: 90 capsule; Refill: 3  4. Folliculitis -Do not scratch Keep clean and dry - cephALEXin (KEFLEX) 500 MG capsule; Take 1 capsule (500 mg total) by mouth 2 (two) times daily.  Dispense: 14 capsule; Refill: 0   Evelina Dun, FNP

## 2016-08-04 NOTE — Patient Instructions (Signed)
Peripheral Neuropathy Peripheral neuropathy is a type of nerve damage. It affects nerves that carry signals between the spinal cord and other parts of the body. These are called peripheral nerves. With peripheral neuropathy, one nerve or a group of nerves may be damaged. What are the causes? Many things can damage peripheral nerves. For some people with peripheral neuropathy, the cause is unknown. Some causes include:  Diabetes. This is the most common cause of peripheral neuropathy.  Injury to a nerve.  Pressure or stress on a nerve that lasts a long time.  Too little vitamin B. Alcoholism can lead to this.  Infections.  Autoimmune diseases, such as multiple sclerosis and systemic lupus erythematosus.  Inherited nerve diseases.  Some medicines, such as cancer drugs.  Toxic substances, such as lead and mercury.  Too little blood flowing to the legs.  Kidney disease.  Thyroid disease.  What are the signs or symptoms? Different people have different symptoms. The symptoms you have will depend on which of your nerves is damaged. Common symptoms include:  Loss of feeling (numbness) in the feet and hands.  Tingling in the feet and hands.  Pain that burns.  Very sensitive skin.  Weakness.  Not being able to move a part of the body (paralysis).  Muscle twitching.  Clumsiness or poor coordination.  Loss of balance.  Not being able to control your bladder.  Feeling dizzy.  Sexual problems.  How is this diagnosed? Peripheral neuropathy is a symptom, not a disease. Finding the cause of peripheral neuropathy can be hard. To figure that out, your health care provider will take a medical history and do a physical exam. A neurological exam will also be done. This involves checking things affected by your brain, spinal cord, and nerves (nervous system). For example, your health care provider will check your reflexes, how you move, and what you can feel. Other types of tests  may also be ordered, such as:  Blood tests.  A test of the fluid in your spinal cord.  Imaging tests, such as CT scans or an MRI.  Electromyography (EMG). This test checks the nerves that control muscles.  Nerve conduction velocity tests. These tests check how fast messages pass through your nerves.  Nerve biopsy. A small piece of nerve is removed. It is then checked under a microscope.  How is this treated?  Medicine is often used to treat peripheral neuropathy. Medicines may include: ? Pain-relieving medicines. Prescription or over-the-counter medicine may be suggested. ? Antiseizure medicine. This may be used for pain. ? Antidepressants. These also may help ease pain from neuropathy. ? Lidocaine. This is a numbing medicine. You might wear a patch or be given a shot. ? Mexiletine. This medicine is typically used to help control irregular heart rhythms.  Surgery. Surgery may be needed to relieve pressure on a nerve or to destroy a nerve that is causing pain.  Physical therapy to help movement.  Assistive devices to help movement. Follow these instructions at home:  Only take over-the-counter or prescription medicines as directed by your health care provider. Follow the instructions carefully for any given medicines. Do not take any other medicines without first getting approval from your health care provider.  If you have diabetes, work closely with your health care provider to keep your blood sugar under control.  If you have numbness in your feet: ? Check every day for signs of injury or infection. Watch for redness, warmth, and swelling. ? Wear padded socks and comfortable   shoes. These help protect your feet.  Do not do things that put pressure on your damaged nerve.  Do not smoke. Smoking keeps blood from getting to damaged nerves.  Avoid or limit alcohol. Too much alcohol can cause a lack of B vitamins. These vitamins are needed for healthy nerves.  Develop a good  support system. Coping with peripheral neuropathy can be stressful. Talk to a mental health specialist or join a support group if you are struggling.  Follow up with your health care provider as directed. Contact a health care provider if:  You have new signs or symptoms of peripheral neuropathy.  You are struggling emotionally from dealing with peripheral neuropathy.  You have a fever. Get help right away if:  You have an injury or infection that is not healing.  You feel very dizzy or begin vomiting.  You have chest pain.  You have trouble breathing. This information is not intended to replace advice given to you by your health care provider. Make sure you discuss any questions you have with your health care provider. Document Released: 03/13/2002 Document Revised: 08/29/2015 Document Reviewed: 11/28/2012 Elsevier Interactive Patient Education  2017 Elsevier Inc.  

## 2016-09-29 ENCOUNTER — Telehealth: Payer: Self-pay | Admitting: Family

## 2016-09-29 ENCOUNTER — Other Ambulatory Visit: Payer: Self-pay | Admitting: *Deleted

## 2016-09-29 DIAGNOSIS — F411 Generalized anxiety disorder: Secondary | ICD-10-CM

## 2016-09-29 DIAGNOSIS — F331 Major depressive disorder, recurrent, moderate: Secondary | ICD-10-CM

## 2016-09-29 MED ORDER — LEVOTHYROXINE SODIUM 88 MCG PO TABS: ORAL_TABLET | ORAL | 1 refills | Status: DC

## 2016-09-29 MED ORDER — CITALOPRAM HYDROBROMIDE 40 MG PO TABS: 40.0000 mg | ORAL_TABLET | Freq: Every day | ORAL | 0 refills | Status: DC

## 2016-09-29 NOTE — Addendum Note (Signed)
Addended by: Antonietta Barcelona D on: 09/29/2016 02:41 PM   Modules accepted: Orders

## 2016-10-06 ENCOUNTER — Ambulatory Visit (INDEPENDENT_AMBULATORY_CARE_PROVIDER_SITE_OTHER): Payer: Medicare Other | Admitting: Family

## 2016-10-06 ENCOUNTER — Encounter: Payer: Self-pay | Admitting: Family

## 2016-10-06 VITALS — BP 129/77 | HR 66 | Temp 97.0°F | Ht 64.0 in | Wt 146.8 lb

## 2016-10-06 DIAGNOSIS — J449 Chronic obstructive pulmonary disease, unspecified: Secondary | ICD-10-CM | POA: Diagnosis not present

## 2016-10-06 DIAGNOSIS — R911 Solitary pulmonary nodule: Secondary | ICD-10-CM

## 2016-10-06 DIAGNOSIS — F331 Major depressive disorder, recurrent, moderate: Secondary | ICD-10-CM | POA: Diagnosis not present

## 2016-10-06 DIAGNOSIS — G2581 Restless legs syndrome: Secondary | ICD-10-CM | POA: Diagnosis not present

## 2016-10-06 DIAGNOSIS — K5900 Constipation, unspecified: Secondary | ICD-10-CM

## 2016-10-06 DIAGNOSIS — R300 Dysuria: Secondary | ICD-10-CM

## 2016-10-06 DIAGNOSIS — I700 Atherosclerosis of aorta: Secondary | ICD-10-CM | POA: Diagnosis not present

## 2016-10-06 DIAGNOSIS — F411 Generalized anxiety disorder: Secondary | ICD-10-CM | POA: Diagnosis not present

## 2016-10-06 DIAGNOSIS — E039 Hypothyroidism, unspecified: Secondary | ICD-10-CM | POA: Diagnosis not present

## 2016-10-06 DIAGNOSIS — R399 Unspecified symptoms and signs involving the genitourinary system: Secondary | ICD-10-CM

## 2016-10-06 LAB — URINALYSIS
Bilirubin, UA: NEGATIVE
GLUCOSE, UA: NEGATIVE
Leukocytes, UA: NEGATIVE
NITRITE UA: NEGATIVE
Protein, UA: NEGATIVE
RBC UA: NEGATIVE
Specific Gravity, UA: 1.01 (ref 1.005–1.030)
UUROB: 0.2 mg/dL (ref 0.2–1.0)
pH, UA: 7 (ref 5.0–7.5)

## 2016-10-06 MED ORDER — CIPROFLOXACIN HCL 500 MG PO TABS: 500.0000 mg | ORAL_TABLET | Freq: Two times a day (BID) | ORAL | 0 refills | Status: DC

## 2016-10-06 NOTE — Patient Instructions (Signed)
Chronic Obstructive Pulmonary Disease Chronic obstructive pulmonary disease (COPD) is a common lung condition in which airflow from the lungs is limited. COPD is a general term that can be used to describe many different lung problems that limit airflow, including both chronic bronchitis and emphysema. If you have COPD, your lung function will probably never return to normal, but there are measures you can take to improve lung function and make yourself feel better. What are the causes?  Smoking (common).  Exposure to secondhand smoke.  Genetic problems.  Chronic inflammatory lung diseases or recurrent infections. What are the signs or symptoms?  Shortness of breath, especially with physical activity.  Deep, persistent (chronic) cough with a large amount of thick mucus.  Wheezing.  Rapid breaths (tachypnea).  Gray or bluish discoloration (cyanosis) of the skin, especially in your fingers, toes, or lips.  Fatigue.  Weight loss.  Frequent infections or episodes when breathing symptoms become much worse (exacerbations).  Chest tightness. How is this diagnosed? Your health care provider will take a medical history and perform a physical examination to diagnose COPD. Additional tests for COPD may include:  Lung (pulmonary) function tests.  Chest X-ray.  CT scan.  Blood tests. How is this treated? Treatment for COPD may include:  Inhaler and nebulizer medicines. These help manage the symptoms of COPD and make your breathing more comfortable.  Supplemental oxygen. Supplemental oxygen is only helpful if you have a low oxygen level in your blood.  Exercise and physical activity. These are beneficial for nearly all people with COPD.  Lung surgery or transplant.  Nutrition therapy to gain weight, if you are underweight.  Pulmonary rehabilitation. This may involve working with a team of health care providers and specialists, such as respiratory, occupational, and physical  therapists. Follow these instructions at home:  Take all medicines (inhaled or pills) as directed by your health care provider.  Avoid over-the-counter medicines or cough syrups that dry up your airway (such as antihistamines) and slow down the elimination of secretions unless instructed otherwise by your health care provider.  If you are a smoker, the most important thing that you can do is stop smoking. Continuing to smoke will cause further lung damage and breathing trouble. Ask your health care provider for help with quitting smoking. He or she can direct you to community resources or hospitals that provide support.  Avoid exposure to irritants such as smoke, chemicals, and fumes that aggravate your breathing.  Use oxygen therapy and pulmonary rehabilitation if directed by your health care provider. If you require home oxygen therapy, ask your health care provider whether you should purchase a pulse oximeter to measure your oxygen level at home.  Avoid contact with individuals who have a contagious illness.  Avoid extreme temperature and humidity changes.  Eat healthy foods. Eating smaller, more frequent meals and resting before meals may help you maintain your strength.  Stay active, but balance activity with periods of rest. Exercise and physical activity will help you maintain your ability to do things you want to do.  Preventing infection and hospitalization is very important when you have COPD. Make sure to receive all the vaccines your health care provider recommends, especially the pneumococcal and influenza vaccines. Ask your health care provider whether you need a pneumonia vaccine.  Learn and use relaxation techniques to manage stress.  Learn and use controlled breathing techniques as directed by your health care provider. Controlled breathing techniques include: 1. Pursed lip breathing. Start by breathing in (inhaling)   through your nose for 1 second. Then, purse your lips as  if you were going to whistle and breathe out (exhale) through the pursed lips for 2 seconds. 2. Diaphragmatic breathing. Start by putting one hand on your abdomen just above your waist. Inhale slowly through your nose. The hand on your abdomen should move out. Then purse your lips and exhale slowly. You should be able to feel the hand on your abdomen moving in as you exhale.  Learn and use controlled coughing to clear mucus from your lungs. Controlled coughing is a series of short, progressive coughs. The steps of controlled coughing are: 1. Lean your head slightly forward. 2. Breathe in deeply using diaphragmatic breathing. 3. Try to hold your breath for 3 seconds. 4. Keep your mouth slightly open while coughing twice. 5. Spit any mucus out into a tissue. 6. Rest and repeat the steps once or twice as needed. Contact a health care provider if:  You are coughing up more mucus than usual.  There is a change in the color or thickness of your mucus.  Your breathing is more labored than usual.  Your breathing is faster than usual. Get help right away if:  You have shortness of breath while you are resting.  You have shortness of breath that prevents you from:  Being able to talk.  Performing your usual physical activities.  You have chest pain lasting longer than 5 minutes.  Your skin color is more cyanotic than usual.  You measure low oxygen saturations for longer than 5 minutes with a pulse oximeter. This information is not intended to replace advice given to you by your health care provider. Make sure you discuss any questions you have with your health care provider. Document Released: 12/31/2004 Document Revised: 08/29/2015 Document Reviewed: 11/17/2012 Elsevier Interactive Patient Education  2017 Elsevier Inc.  

## 2016-10-06 NOTE — Progress Notes (Signed)
Subjective:    Patient ID: Carolyn [name], female    DOB: [birth year], 66 y.o.   MRN: [id number]  PT presents to the office today for chronic follow up.  Dysuria   The current episode started in the past 7 days. The problem occurs every urination. The problem has been gradually worsening. The quality of the pain is described as aching. The pain is at a severity of 3/10. The pain is mild. Associated symptoms include flank pain, frequency and urgency. Pertinent negatives include no hematuria, nausea or vomiting. She has tried increased fluids for the symptoms. The treatment provided mild relief.  Anxiety  Presents for follow-up visit. Symptoms include depressed mood, excessive worry, irritability, nervous/anxious behavior, panic and restlessness. Patient reports no nausea. Symptoms occur occasionally.    Depression         This is a chronic problem.  The current episode started more than 1 year ago.   The onset quality is gradual.   The problem occurs intermittently.  The problem has been waxing and waning since onset.  Associated symptoms include fatigue, restlessness, decreased interest and sad.  Associated symptoms include no helplessness and no hopelessness.  Past treatments include SSRIs - Selective serotonin reuptake inhibitors.  Past medical history includes thyroid problem and anxiety.   Thyroid Problem  Presents for follow-up visit. Symptoms include anxiety, constipation, depressed mood and fatigue. The symptoms have been stable.  Constipation  This is a chronic problem. The current episode started more than 1 year ago. The problem has been waxing and waning since onset. Her stool frequency is 2 to 3 times per week. Pertinent negatives include no nausea or vomiting. She has tried laxatives for the symptoms. The treatment provided mild relief.  RLS Pt currently taking Requip 3 mg. States this is helping.  COPD PT smokes 1/2 pack a day. Stable. Has Pulmonary Nodule and CT scan on  07/24/16 that recommends follow up scan in 12 months.    Review of Systems  Constitutional: Positive for fatigue and irritability.  Gastrointestinal: Positive for constipation. Negative for nausea and vomiting.  Genitourinary: Positive for dysuria, flank pain, frequency and urgency. Negative for hematuria.  Psychiatric/Behavioral: Positive for depression. The patient is nervous/anxious.   All other systems reviewed and are negative.      Objective:   Physical Exam  Constitutional: She is oriented to person, place, and time. She appears well-developed and well-nourished. No distress.  HENT:  Head: Normocephalic and atraumatic.  Right Ear: External ear normal.  Mouth/Throat: Oropharynx is clear and moist.  Eyes: Pupils are equal, round, and reactive to light.  Neck: Normal range of motion. Neck supple. No thyromegaly present.  Cardiovascular: Normal rate, regular rhythm and intact distal pulses.   Murmur heard. Pulmonary/Chest: Effort normal. No respiratory distress. She has wheezes.  Abdominal: Soft. Bowel sounds are normal. She exhibits no distension. There is no tenderness.  Musculoskeletal: Normal range of motion. She exhibits no edema or tenderness.  Neurological: She is alert and oriented to person, place, and time.  Skin: Skin is warm and dry.  Psychiatric: She has a normal mood and affect. Her behavior is normal. Judgment and thought content normal.  Vitals reviewed.     BP 129/77   Pulse 66   Temp (!) 97 F (36.1 C) (Oral)   Ht '5\' 4"'  (1.626 m)   Wt 146 lb 12.8 oz (66.6 kg)   BMI 25.20 kg/m      Assessment & Plan:  1. Dysuria - Urinalysis -  CMP14+EGFR - Urine Culture - ciprofloxacin (CIPRO) 500 MG tablet; Take 1 tablet (500 mg total) by mouth 2 (two) times daily.  Dispense: 14 tablet; Refill: 0  2. Hypothyroidism, unspecified type - CMP14+EGFR - Thyroid Panel With TSH  3. GAD (generalized anxiety disorder) - CMP14+EGFR  4. Moderate episode of recurrent  major depressive disorder (HCC) - CMP14+EGFR  5. Constipation, unspecified constipation type - CMP14+EGFR  6. Chronic obstructive pulmonary disease, unspecified COPD type ([place]) - CMP14+EGFR  7. Aortic atherosclerosis (HCC) - CMP14+EGFR - Lipid panel  8. Restless leg syndrome  9. Pulmonary nodule Follow up appt 02/19  10. UTI symptoms We will treat pt for UTI until culture comes back. We only have dip available in office which is negative, but pt is very symptomatic  today - Urine Culture - ciprofloxacin (CIPRO) 500 MG tablet; Take 1 tablet (500 mg total) by mouth 2 (two) times daily.  Dispense: 14 tablet; Refill: 0   Continue all meds Labs pending Health Maintenance reviewed Diet and exercise encouraged RTO 6 months and make follow up appt 02/19 for Pulmonary Nodule follow up   Evelina Dun, FNP

## 2016-10-07 LAB — THYROID PANEL WITH TSH
Free Thyroxine Index: 2.2 (ref 1.2–4.9)
T3 UPTAKE RATIO: 29 % (ref 24–39)
T4 TOTAL: 7.7 ug/dL (ref 4.5–12.0)
TSH: 1.97 u[IU]/mL (ref 0.450–4.500)

## 2016-10-07 LAB — CMP14+EGFR
A/G RATIO: 2 (ref 1.2–2.2)
ALK PHOS: 67 IU/L (ref 39–117)
ALT: 13 IU/L (ref 0–32)
AST: 28 IU/L (ref 0–40)
Albumin: 4.3 g/dL (ref 3.6–4.8)
BUN/Creatinine Ratio: 10 — ABNORMAL LOW (ref 12–28)
BUN: 7 mg/dL — ABNORMAL LOW (ref 8–27)
Bilirubin Total: 0.6 mg/dL (ref 0.0–1.2)
CALCIUM: 9.3 mg/dL (ref 8.7–10.3)
CO2: 22 mmol/L (ref 20–29)
Chloride: 89 mmol/L — ABNORMAL LOW (ref 96–106)
Creatinine, Ser: 0.73 mg/dL (ref 0.57–1.00)
GFR calc Af Amer: 101 mL/min/{1.73_m2} (ref >59)
GFR calc non Af Amer: 88 mL/min/{1.73_m2} (ref >59)
GLOBULIN, TOTAL: 2.2 g/dL (ref 1.5–4.5)
Glucose: 80 mg/dL (ref 65–99)
POTASSIUM: 4.9 mmol/L (ref 3.5–5.2)
SODIUM: 128 mmol/L — AB (ref 134–144)
Total Protein: 6.5 g/dL (ref 6.0–8.5)

## 2016-10-07 LAB — LIPID PANEL
CHOL/HDL RATIO: 1.9 ratio (ref 0.0–4.4)
Cholesterol, Total: 179 mg/dL (ref 100–199)
HDL: 95 mg/dL (ref >39)
LDL Calculated: 73 mg/dL (ref 0–99)
Triglycerides: 56 mg/dL (ref 0–149)
VLDL Cholesterol Cal: 11 mg/dL (ref 5–40)

## 2016-10-09 LAB — URINE CULTURE

## 2016-10-12 ENCOUNTER — Ambulatory Visit: Payer: Medicare Other | Admitting: Family

## 2016-10-13 ENCOUNTER — Encounter: Payer: Self-pay | Admitting: Family

## 2016-10-13 ENCOUNTER — Other Ambulatory Visit: Payer: Self-pay | Admitting: Family

## 2016-10-13 DIAGNOSIS — F331 Major depressive disorder, recurrent, moderate: Secondary | ICD-10-CM

## 2016-10-13 DIAGNOSIS — F411 Generalized anxiety disorder: Secondary | ICD-10-CM

## 2016-10-13 MED ORDER — CLONAZEPAM 0.5 MG PO TABS: 0.5000 mg | ORAL_TABLET | Freq: Every day | ORAL | 1 refills | Status: DC

## 2016-10-13 NOTE — Progress Notes (Signed)
rx called into pharmacy

## 2016-10-13 NOTE — Progress Notes (Signed)
Please call rx in. Thanks 

## 2016-10-19 ENCOUNTER — Encounter: Payer: Self-pay | Admitting: Nurse Practitioner

## 2016-10-19 DIAGNOSIS — Z1231 Encounter for screening mammogram for malignant neoplasm of breast: Secondary | ICD-10-CM | POA: Diagnosis not present

## 2016-10-19 DIAGNOSIS — M8589 Other specified disorders of bone density and structure, multiple sites: Secondary | ICD-10-CM | POA: Diagnosis not present

## 2016-10-26 ENCOUNTER — Other Ambulatory Visit: Payer: Self-pay | Admitting: Family

## 2016-10-26 DIAGNOSIS — F411 Generalized anxiety disorder: Secondary | ICD-10-CM

## 2016-10-26 DIAGNOSIS — F331 Major depressive disorder, recurrent, moderate: Secondary | ICD-10-CM

## 2016-10-27 ENCOUNTER — Other Ambulatory Visit: Payer: Self-pay | Admitting: *Deleted

## 2016-10-27 DIAGNOSIS — F331 Major depressive disorder, recurrent, moderate: Secondary | ICD-10-CM

## 2016-10-27 DIAGNOSIS — F411 Generalized anxiety disorder: Secondary | ICD-10-CM

## 2016-10-27 NOTE — Telephone Encounter (Signed)
Rx has been called to [facility] pharmacy

## 2016-10-30 ENCOUNTER — Encounter: Payer: Self-pay | Admitting: Family

## 2016-10-30 ENCOUNTER — Telehealth: Payer: Self-pay | Admitting: Family

## 2016-10-30 NOTE — Telephone Encounter (Signed)
Patient states that she had a mammogram and bone density done at [facility] around 7/16. States they were first sent to [facility] and then when she corrected [place] they were supposed to be re sent to [facility]. Patient wants to know if [facility] has them and if Lenna Gilford has reviewed them? Patient is concerned. Please advise

## 2016-10-30 NOTE — Telephone Encounter (Signed)
Mammogram is negative, Bone Density scan shows osteopenia. Pt needs to do weight bearing exercises, make sure she takes calcium and Vit D.

## 2016-10-30 NOTE — Telephone Encounter (Signed)
LM for pt to call so we can review CH's recommendations.

## 2016-11-03 DIAGNOSIS — M545 Low back pain: Secondary | ICD-10-CM | POA: Diagnosis not present

## 2016-11-03 DIAGNOSIS — M79604 Pain in right leg: Secondary | ICD-10-CM | POA: Diagnosis not present

## 2016-11-03 DIAGNOSIS — M79605 Pain in left leg: Secondary | ICD-10-CM | POA: Diagnosis not present

## 2016-11-03 NOTE — Telephone Encounter (Signed)
Left message for patient to call to discuss results if she has not already been informed.

## 2016-11-17 DIAGNOSIS — H1013 Acute atopic conjunctivitis, bilateral: Secondary | ICD-10-CM | POA: Diagnosis not present

## 2016-11-17 DIAGNOSIS — H40033 Anatomical narrow angle, bilateral: Secondary | ICD-10-CM | POA: Diagnosis not present

## 2016-11-21 DIAGNOSIS — Z23 Encounter for immunization: Secondary | ICD-10-CM | POA: Diagnosis not present

## 2016-12-02 DIAGNOSIS — H25811 Combined forms of age-related cataract, right eye: Secondary | ICD-10-CM | POA: Diagnosis not present

## 2016-12-02 DIAGNOSIS — H5231 Anisometropia: Secondary | ICD-10-CM | POA: Diagnosis not present

## 2016-12-02 DIAGNOSIS — H25812 Combined forms of age-related cataract, left eye: Secondary | ICD-10-CM | POA: Diagnosis not present

## 2016-12-09 ENCOUNTER — Encounter: Payer: Self-pay | Admitting: Family

## 2016-12-16 ENCOUNTER — Other Ambulatory Visit (HOSPITAL_COMMUNITY)
Admission: RE | Admit: 2016-12-16 | Discharge: 2016-12-16 | Disposition: A | Discharge status: Home / Self Care | Payer: Medicare Other | Source: Other Acute Inpatient Hospital | Attending: Urology | Admitting: Urology

## 2016-12-16 ENCOUNTER — Ambulatory Visit (INDEPENDENT_AMBULATORY_CARE_PROVIDER_SITE_OTHER): Payer: Medicare Other | Admitting: Urology

## 2016-12-16 DIAGNOSIS — R3121 Asymptomatic microscopic hematuria: Secondary | ICD-10-CM | POA: Diagnosis not present

## 2016-12-16 DIAGNOSIS — R3915 Urgency of urination: Secondary | ICD-10-CM

## 2016-12-16 LAB — URINALYSIS, COMPLETE (UACMP) WITH MICROSCOPIC
BILIRUBIN URINE: NEGATIVE
Bacteria, UA: NONE SEEN
GLUCOSE, UA: NEGATIVE mg/dL
HGB URINE DIPSTICK: NEGATIVE
Ketones, ur: NEGATIVE mg/dL
Leukocytes, UA: NEGATIVE
NITRITE: NEGATIVE
PROTEIN: NEGATIVE mg/dL
SPECIFIC GRAVITY, URINE: 1.006 (ref 1.005–1.030)
Squamous Epithelial / LPF: NONE SEEN
pH: 6 (ref 5.0–8.0)

## 2016-12-23 DIAGNOSIS — H2512 Age-related nuclear cataract, left eye: Secondary | ICD-10-CM | POA: Diagnosis not present

## 2016-12-23 DIAGNOSIS — H25812 Combined forms of age-related cataract, left eye: Secondary | ICD-10-CM | POA: Diagnosis not present

## 2016-12-23 DIAGNOSIS — Z961 Presence of intraocular lens: Secondary | ICD-10-CM | POA: Diagnosis not present

## 2016-12-25 ENCOUNTER — Encounter: Payer: Self-pay | Admitting: *Deleted

## 2016-12-27 ENCOUNTER — Other Ambulatory Visit: Payer: Self-pay | Admitting: Family

## 2016-12-28 ENCOUNTER — Other Ambulatory Visit: Payer: Self-pay | Admitting: Family

## 2016-12-28 DIAGNOSIS — F331 Major depressive disorder, recurrent, moderate: Secondary | ICD-10-CM

## 2016-12-28 DIAGNOSIS — F411 Generalized anxiety disorder: Secondary | ICD-10-CM

## 2017-01-07 DIAGNOSIS — H25811 Combined forms of age-related cataract, right eye: Secondary | ICD-10-CM | POA: Diagnosis not present

## 2017-01-07 DIAGNOSIS — H2511 Age-related nuclear cataract, right eye: Secondary | ICD-10-CM | POA: Diagnosis not present

## 2017-01-07 DIAGNOSIS — Z961 Presence of intraocular lens: Secondary | ICD-10-CM | POA: Diagnosis not present

## 2017-01-20 ENCOUNTER — Ambulatory Visit: Payer: Medicare Other | Admitting: Urology

## 2017-01-26 ENCOUNTER — Ambulatory Visit (INDEPENDENT_AMBULATORY_CARE_PROVIDER_SITE_OTHER): Payer: Medicare Other | Admitting: Obstetrics and Gynecology

## 2017-01-26 ENCOUNTER — Other Ambulatory Visit (HOSPITAL_COMMUNITY)
Admission: RE | Admit: 2017-01-26 | Discharge: 2017-01-26 | Disposition: A | Discharge status: Home / Self Care | Payer: Medicare Other | Source: Ambulatory Visit | Attending: Obstetrics and Gynecology | Admitting: Obstetrics and Gynecology

## 2017-01-26 ENCOUNTER — Encounter: Payer: Self-pay | Admitting: Obstetrics and Gynecology

## 2017-01-26 VITALS — BP 137/79 | HR 64 | Temp 97.4°F | Ht 66.0 in | Wt 144.7 lb

## 2017-01-26 DIAGNOSIS — R87810 Cervical high risk human papillomavirus (HPV) DNA test positive: Secondary | ICD-10-CM | POA: Diagnosis not present

## 2017-01-26 DIAGNOSIS — N870 Mild cervical dysplasia: Secondary | ICD-10-CM | POA: Insufficient documentation

## 2017-01-26 DIAGNOSIS — Z01411 Encounter for gynecological examination (general) (routine) with abnormal findings: Secondary | ICD-10-CM | POA: Insufficient documentation

## 2017-01-26 DIAGNOSIS — R87612 Low grade squamous intraepithelial lesion on cytologic smear of cervix (LGSIL): Secondary | ICD-10-CM | POA: Diagnosis not present

## 2017-01-26 DIAGNOSIS — Z01419 Encounter for gynecological examination (general) (routine) without abnormal findings: Secondary | ICD-10-CM | POA: Diagnosis not present

## 2017-01-26 NOTE — Progress Notes (Signed)
Presents for AEX. Wants PAP

## 2017-01-26 NOTE — Progress Notes (Signed)
Subjective:     Carolyn [name] is a 66 y.o. female G3P2 with BMI 23 who is here for a comprehensive physical exam. The patient reports no problems. She denies any vaginal bleeding, abnormal discharge or pelvic pain. She is not sexually active. She denies any urinary incontinence  Past Medical History:  Diagnosis Date  . Cervical cancer ([place])   . Depression   . Melanoma ([place])   . Thyroid disease    Past Surgical History:  Procedure Laterality Date  . CERVICAL CONIZATION W/BX    . MELANOMA EXCISION  08/2014   back  . SHOULDER SURGERY Right    Family History  Problem Relation Age of Onset  . Heart disease Mother   . Aneurysm Mother   . Heart disease Father   . Kidney failure Father     Social History   Social History  . Marital status: Divorced    Spouse name: N/A  . Number of children: N/A  . Years of education: N/A   Occupational History  . Not on file.   Social History Main Topics  . Smoking status: Current Every Day Smoker    Packs/day: 1.00    Types: Cigarettes  . Smokeless tobacco: Never Used  . Alcohol use No  . Drug use: No  . Sexual activity: No   Other Topics Concern  . Not on file   Social History Narrative  . No narrative on file   Health Maintenance  Topic Date Due  . MAMMOGRAM  10/20/2018  . PAP SMEAR  01/06/2019  . TETANUS/TDAP  06/01/2025  . COLONOSCOPY  09/10/2025  . INFLUENZA VACCINE  Completed  . Hepatitis C Screening  Completed  . HIV Screening  Completed       Review of Systems Pertinent items are noted in HPI.   Objective:  Blood pressure 137/79, pulse 64, temperature (!) 97.4 F (36.3 C), height 5\' 6"  (1.676 m), weight 144 lb 11.2 oz (65.6 kg).     GENERAL: Well-developed, well-nourished female in no acute distress.  HEENT: Normocephalic, atraumatic. Sclerae anicteric.  NECK: Supple. Normal thyroid.  LUNGS: Clear to auscultation bilaterally.  HEART: Regular rate and rhythm. BREASTS: Symmetric in size. No palpable masses  or lymphadenopathy, skin changes, or nipple drainage. ABDOMEN: Soft, nontender, nondistended.  PELVIC: Normal external female genitalia. Vagina is pale and atrophic.  Normal discharge. Cervix is atrophic and flush with vaginal vault. Uterus is normal in size. No adnexal mass or tenderness. EXTREMITIES: No cyanosis, clubbing, or edema, 2+ distal pulses.    Assessment:    Healthy female exam.      Plan:    pap smear collected Patient with normal screening mammogram in 10/2016 Patient advised to perform monthly self breast and vulva exam Patient will be contacted with abnormal results RTC prn or in 1 year for repeat pap smear pending results See After Visit Summary for Counseling Recommendations

## 2017-01-29 LAB — CYTOLOGY - PAP
HPV 16/18/45 genotyping: POSITIVE — AB
HPV: DETECTED — AB

## 2017-02-01 ENCOUNTER — Telehealth: Payer: Self-pay | Admitting: Pediatrics

## 2017-02-01 NOTE — Telephone Encounter (Signed)
-----   Message from Mora Bellman, MD sent at 01/30/2017 11:27 AM EDT ----- Please inform patient of abnormal pap smear, similar to all previous pap smears. I would recommend for her to have cryotherapy performed.   Please schedule patient to return for cryotherapy. If cryotherapy cannot be performed at [facility], please schedule patient to have it done at [facility]

## 2017-02-01 NOTE — Telephone Encounter (Signed)
LMTCB

## 2017-02-01 NOTE — Telephone Encounter (Signed)
Pt advised of results and recommendations. Cryo to be performed at [facility]. Pt transferred to front for scheduling.  Pt voiced understanding and agreed with plan.

## 2017-02-01 NOTE — Telephone Encounter (Signed)
Cryo sch 02/15/17 w/ Dr. Elly Modena.

## 2017-02-10 ENCOUNTER — Encounter: Payer: Self-pay | Admitting: Family Medicine

## 2017-02-10 ENCOUNTER — Ambulatory Visit (INDEPENDENT_AMBULATORY_CARE_PROVIDER_SITE_OTHER): Payer: Medicare Other | Admitting: Family Medicine

## 2017-02-10 VITALS — BP 157/81 | HR 64 | Temp 97.0°F | Ht 66.0 in | Wt 149.4 lb

## 2017-02-10 DIAGNOSIS — J0100 Acute maxillary sinusitis, unspecified: Secondary | ICD-10-CM

## 2017-02-10 DIAGNOSIS — Z720 Tobacco use: Secondary | ICD-10-CM | POA: Insufficient documentation

## 2017-02-10 DIAGNOSIS — F17200 Nicotine dependence, unspecified, uncomplicated: Secondary | ICD-10-CM | POA: Diagnosis not present

## 2017-02-10 MED ORDER — FLUTICASONE PROPIONATE 50 MCG/ACT NA SUSP: 1.0000 | Freq: Every day | NASAL | 6 refills | Status: DC

## 2017-02-10 MED ORDER — AMOXICILLIN-POT CLAVULANATE 875-125 MG PO TABS: 1.0000 | ORAL_TABLET | Freq: Two times a day (BID) | ORAL | 0 refills | Status: DC

## 2017-02-10 NOTE — Progress Notes (Signed)
Subjective: AL:[id number] PCP: Sharion Balloon, FNP WIO:[id number] [place] is a 66 y.o. female presenting to clinic today for:  1. Cough Patient reports a 2 week h/o productive cough, sinus pressure, nasal congestion and facial pain.  Denies hemoptysis, shortness of breath, wheeze, chest pain, post tussive emesis, nausea, diarrhea, fever, chills, sick contacts.  She has been using Robitussin-DM, Mucinex, Flonase, humidification and some leftover prednisone that she had for another issue (which she has been using for the last 5 days).  She notes slight improvement with the Mucinex but continues to feel poorly.  Patient denies recent travel.  Medical history significant for COPD.  She is not on any medications for COPD.  She is a current daily tobacco user.  Patient is UTD on immunizations.   Allergies  Allergen Reactions  . Azithromycin Rash   Past Medical History:  Diagnosis Date  . Cervical cancer ([place])   . Depression   . Melanoma ([place])   . Thyroid disease    Family History  Problem Relation Age of Onset  . Heart disease Mother   . Aneurysm Mother   . Heart disease Father   . Kidney failure Father     Current Outpatient Medications:  .  baclofen (LIORESAL) 20 MG tablet, Take 1 tablet (20 mg total) by mouth 3 (three) times daily., Disp: 30 each, Rfl: 0 .  Calcium Carbonate (CALCIUM 600 PO), Take by mouth., Disp: , Rfl:  .  citalopram (CELEXA) 40 MG tablet, TAKE 1 TABLET BY MOUTH EVERY DAY, Disp: 90 tablet, Rfl: 0 .  clonazePAM (KLONOPIN) 0.5 MG tablet, Take 1 tablet (0.5 mg total) by mouth at bedtime., Disp: 90 tablet, Rfl: 1 .  Cyanocobalamin (VITAMIN B 12 PO), Take 1,000 mcg by mouth daily., Disp: , Rfl:  .  fluticasone (FLONASE) 50 MCG/ACT nasal spray, Place 1 spray daily into both nostrils., Disp: 16 g, Rfl: 6 .  Garlic 2992 MG CAPS, Take by mouth., Disp: , Rfl:  .  levothyroxine (SYNTHROID, LEVOTHROID) 88 MCG tablet, TAKE ONE TABLET BY MOUTH ONCE DAILY BEFORE BREAKFAST, Disp:  90 tablet, Rfl: 1 .  Multiple Vitamins-Minerals (CENTRUM SILVER ADULT 50+ PO), Take 1 tablet by mouth daily., Disp: , Rfl:  .  Omega-3 Fatty Acids (FISH OIL) 1000 MG CAPS, Take by mouth., Disp: , Rfl:  .  rOPINIRole (REQUIP) 3 MG tablet, TAKE 1 TABLET BY MOUTH AT BEDTIME., Disp: 90 tablet, Rfl: 0 .  amoxicillin-clavulanate (AUGMENTIN) 875-125 MG tablet, Take 1 tablet 2 (two) times daily by mouth., Disp: 20 tablet, Rfl: 0  Social Hx: daily smoker.  Health Maintenance:Flu shot  ROS: Per HPI  Objective: Office vital signs reviewed. BP (!) 157/81   Pulse 64   Temp (!) 97 F (36.1 C) (Oral)   Ht 5\' 6"  (1.676 m)   Wt 149 lb 6.4 oz (67.8 kg)   SpO2 100%   BMI 24.11 kg/m   Physical Examination:  General: Awake, alert, thin female, tired appearing, No acute distress HEENT: +TTP to maxillary sinuses. No TTP to frontal sinuses.    Neck: No masses palpated. No lymphadenopathy    Ears: Tympanic membranes intact, normal light reflex, no erythema, no bulging    Eyes: PERRLA, extraocular membranes intact, sclera white, no ocular discharge    Nose: nasal turbinates moist, clear nasal discharge    Throat: moist mucus membranes, no erythema, no tonsillar exudate.  Airway is patent Cardio: regular rate and rhythm, S1S2 heard, 2/6 SEM appreciated along RSB and LSB. Pulm:  prolonged expiratory phase noted; no wheezes, rhonchi or rales; normal work of breathing on room air  Assessment/ Plan: 66 y.o. female   1. Acute maxillary sinusitis, recurrence not specified Patient is afebrile with slightly elevated blood pressures for age.  Pulse ox on room air is 100%.  Cardiopulmonary exam remarkable for 2 out of 6 systolic ejection murmur and prolonged expiratory phase.  No wheezes, increased work of breathing appreciated.  HEENT exam remarkable for maxillary tenderness to palpation.  No purulence noted from the nares.   There is no evidence of COPD exacerbation today.  Given duration of symptoms, will cover  for acute bacterial sinusitis. She seems to have responded well to Augmentin in the past.  This is been prescribed twice daily for 10 days.  Her Flonase was also refilled today.  Home care instructions were also reviewed and a handout was provided.  Tobacco cessation was recommended see below.  Strict return precautions and reasons for emergent evaluation in the emergency department review with patient.  They voiced understanding and will follow-up as needed.  2. Tobacco use disorder Patient is pre-contemplative.  I did review with her the increase risk of recurrent respiratory infections and risk to overall health in the setting of tobacco use.  Would continue to counsel with each visit.  No orders of the defined types were placed in this encounter.  Meds ordered this encounter  Medications  . amoxicillin-clavulanate (AUGMENTIN) 875-125 MG tablet    Sig: Take 1 tablet 2 (two) times daily by mouth.    Dispense:  20 tablet    Refill:  0  . fluticasone (FLONASE) 50 MCG/ACT nasal spray    Sig: Place 1 spray daily into both nostrils.    Dispense:  16 g    Refill:  6   Patient's blood pressure noted to be elevated for age.  This may be secondary to cough.  She demonstrated no red flag signs.  She is not on any blood pressure medications at this time.  I did recommend that she follow-up in the next few weeks with her PCP for recheck.  Janora Norlander, DO [place] [phone]

## 2017-02-10 NOTE — Patient Instructions (Addendum)
Because her symptoms have been going on for longer than 2 weeks, I am prescribing you empiric antibiotics to cover for possible bacterial sinus infection.  I have sent in Augmentin for you to take twice a day for the next 10 days.  I have also refilled your Flonase.  As we discussed use this 2 sprays in each nostril daily.  I did highly recommend that you stop smoking as this will increase her risk of recurrent sinus and upper respiratory infections.  - Get plenty of rest and drink plenty of fluids. - Try to breathe moist air. Use a cold mist humidifier. - Consume warm fluids (soup or tea) to provide relief for a stuffy nose and to loosen phlegm. - For nasal stuffiness, try saline nasal spray or a Neti Pot. Afrin nasal spray can also be used but this product should not be used longer than 3 days or it will cause rebound nasal stuffiness (worsening nasal congestion). - For sore throat pain relief: suck on throat lozenges, hard candy or popsicles; gargle with warm salt water (1/4 tsp. salt per 8 oz. of water); and eat soft, bland foods. - Eat a well-balanced diet. If you cannot, ensure you are getting enough nutrients by taking a daily multivitamin. - Avoid dairy products, as they can thicken phlegm. - Avoid alcohol, as it impairs your body's immune system.  CONTACT YOUR DOCTOR IF YOU EXPERIENCE ANY OF THE FOLLOWING: - High fever - You are unable to stay hydrated, develop severe weakness - Flare up of any chronic lung problem, such as asthma

## 2017-02-15 ENCOUNTER — Ambulatory Visit (INDEPENDENT_AMBULATORY_CARE_PROVIDER_SITE_OTHER): Payer: Medicare Other | Admitting: Obstetrics and Gynecology

## 2017-02-15 ENCOUNTER — Encounter: Payer: Self-pay | Admitting: Obstetrics and Gynecology

## 2017-02-15 VITALS — BP 147/85 | HR 73 | Temp 99.0°F | Ht 66.0 in | Wt 146.0 lb

## 2017-02-15 DIAGNOSIS — R87612 Low grade squamous intraepithelial lesion on cytologic smear of cervix (LGSIL): Secondary | ICD-10-CM

## 2017-02-15 NOTE — Patient Instructions (Signed)
Cervical cryotherapy is a procedure which involves freezing an area of abnormal tissue on the cervix. This tissue gradually disappears and the cervix heals. One cervical cryotherapy is usually sufficient to destroy the abnormal tissue.  Purpose  Cervical cryotherapy is a standard method used to treat cervical dysplasia, meaning the removal of abnormal cell tissue on the cervix.  Description  Cervical cryotherapy, or freezing, usually lasts about five minutes and causes a slight amount of discomfort. The procedure is usually performed in an outpatient setting.  Cervical cryotherapy is done by placing a small freeze-probe (cryoprobe) against the cervix that cools the cervix to sub-zero temperatures. The cells destroyed by freezing are shed afterwards in a heavy watery discharge. The main advantage of cryotherapy is that it is a simple procedure that requires inexpensive equipment.  The cryogenic device consists of a gas tank containing a refrigerant and non-explosive, non-toxic gas (usually nitrous oxide). The gas is delivered using flexible tubing through a gun-type attachment to the cryoprobe.  Diagnosis/Preparation  Women who undergo cervical cryotherapy typically have had an abnormal Pap smear which has led to a diagnosis of cervical squamous dysplasia and usually confirmed by biopsy after an adequate colposcopic exam.  Preparation for cervical cryotherapy involves scheduling the procedure when the patient is not experiencing heavy menstrual flow. Ibuprofen, ketoprofen, or naproxen sodium may be given before cryotherapy to decrease cramping. If there is any doubt about the pregnancy status, a pregnancy test is performed.  Aftercare  Cervical cryotherapy is often followed by a heavy and often odorous discharge during the first month after the procedure. The discharge is due to the dead tissue cells leaving the treatment site, and Aminocerv cream may be prescribed. The patient should abstain from sexual  intercourse and not use tampons for a period of three weeks after the procedure. Excessive exercise should also be avoided to lessen the occurrence of post-therapy bleeding.  Risks  The following risks have been associated with cervical cryotherapy:  Uterine cramping. Often occurs during the cryotherapy but rapidly subsides after treatment.  Bleeding and infection. Rare, but incidences have been reported.  More difficult Pap smears. Future Pap smears and colposcopy may be more difficult after cryotherapy.  Normal results  A normal result is no recurrence of the abnormal cervix cells. The first follow-up Pap smear is done within three to six months. If normal, Pap smears are repeated every six months for two years. If any, recurrences usually occur within two years of treatment. Another option is to replace the initial and each yearly Pap smear with a colposcopic examination.  If a follow-up Pap smear is abnormal, a colposcopy with biopsy is usually performed. Other treatment methods, usually the loop electrocautery excision procedure (LEEP) are then used if persistent disease is discovered.  Following the procedure, it is considered normal to experience the following:  slight cramping for two to three days  watery discharge requiring several pad changes daily  bloody discharge, especially 12-16 days after the procedure  Alternatives  Loop electrocautery excision procedure (LEEP). This procedure uses a fine wire loop with an electric current flowing through it to remove the desired area of the cervix. Loop excision is usually done under local anesthesia and causes very little discomfort.

## 2017-02-15 NOTE — Progress Notes (Signed)
Cryotherapy details The indications for cryotherapy were reviewed with the patient in detail. She was counseled about that efficacy of this procedure, and possible need for excisional procedure in the future if her cervical dysplasia persists.  The risks of the procedure where explained in detail and patient was told to expect a copious amount of discharge in the next few weeks. All her questions were answered, and written informed consent was obtained.  The patient was placed in the dorsal lithotomy position and a vaginal speculum was placed. Her cervix was visualized and patient was noted to have had normal size transformation zone. The appropriate cryotherapy probe was picked and affixed to cryotherapy apparatus. Then nitrogen gas was then activated, the probe was coated with lubricating jelly and applied to the transformation zone of the cervix. This was kept in place for 3 minutes. The cryotherapy was then stopped and all instruments were removed from the patient's pelvis; a thawing period of 3 minutes was observed.  A second cycle of cryotherapy was then administered to the cervix for 3 minutes.  The patient tolerated the procedure well without any complications. Routine post procedure instructions were given to the patient.  Will follow up results and manage accordingly to ASCCP guidelines which specify co-testing with pap and HPV typing at 12 and 24 months.

## 2017-03-03 ENCOUNTER — Encounter: Payer: Self-pay | Admitting: *Deleted

## 2017-03-09 ENCOUNTER — Other Ambulatory Visit: Payer: Self-pay | Admitting: Family

## 2017-03-09 DIAGNOSIS — F411 Generalized anxiety disorder: Secondary | ICD-10-CM

## 2017-03-09 DIAGNOSIS — F331 Major depressive disorder, recurrent, moderate: Secondary | ICD-10-CM

## 2017-04-06 DIAGNOSIS — J189 Pneumonia, unspecified organism: Secondary | ICD-10-CM

## 2017-04-06 HISTORY — DX: Pneumonia, unspecified organism: J18.9

## 2017-04-08 ENCOUNTER — Ambulatory Visit: Payer: Medicare Other | Admitting: Family

## 2017-04-12 ENCOUNTER — Ambulatory Visit (INDEPENDENT_AMBULATORY_CARE_PROVIDER_SITE_OTHER): Payer: Medicare HMO | Admitting: Family

## 2017-04-12 ENCOUNTER — Encounter: Payer: Self-pay | Admitting: Family

## 2017-04-12 VITALS — BP 135/88 | HR 74 | Temp 97.4°F | Ht 66.0 in | Wt 150.0 lb

## 2017-04-12 DIAGNOSIS — K5900 Constipation, unspecified: Secondary | ICD-10-CM

## 2017-04-12 DIAGNOSIS — F17200 Nicotine dependence, unspecified, uncomplicated: Secondary | ICD-10-CM

## 2017-04-12 DIAGNOSIS — R69 Illness, unspecified: Secondary | ICD-10-CM | POA: Diagnosis not present

## 2017-04-12 DIAGNOSIS — I700 Atherosclerosis of aorta: Secondary | ICD-10-CM | POA: Diagnosis not present

## 2017-04-12 DIAGNOSIS — F331 Major depressive disorder, recurrent, moderate: Secondary | ICD-10-CM | POA: Diagnosis not present

## 2017-04-12 DIAGNOSIS — J449 Chronic obstructive pulmonary disease, unspecified: Secondary | ICD-10-CM

## 2017-04-12 DIAGNOSIS — E039 Hypothyroidism, unspecified: Secondary | ICD-10-CM | POA: Diagnosis not present

## 2017-04-12 DIAGNOSIS — F411 Generalized anxiety disorder: Secondary | ICD-10-CM | POA: Diagnosis not present

## 2017-04-12 MED ORDER — CLONAZEPAM 0.5 MG PO TABS: 0.5000 mg | ORAL_TABLET | Freq: Every day | ORAL | 1 refills | Status: DC

## 2017-04-12 MED ORDER — ATORVASTATIN CALCIUM 10 MG PO TABS: 10.0000 mg | ORAL_TABLET | Freq: Every day | ORAL | 3 refills | Status: DC

## 2017-04-12 MED ORDER — ASPIRIN EC 81 MG PO TBEC: 81.0000 mg | DELAYED_RELEASE_TABLET | Freq: Every day | ORAL | 1 refills | Status: DC

## 2017-04-12 MED ORDER — FLUTICASONE FUROATE-VILANTEROL 100-25 MCG/INH IN AEPB: 1.0000 | INHALATION_SPRAY | Freq: Every day | RESPIRATORY_TRACT | 1 refills | Status: DC

## 2017-04-12 MED ORDER — BACLOFEN 10 MG PO TABS: 10.0000 mg | ORAL_TABLET | Freq: Three times a day (TID) | ORAL | 3 refills | Status: DC

## 2017-04-12 NOTE — Progress Notes (Signed)
Subjective:    Patient ID: Carolyn [name], female    DOB: [birth year], 67 y.o.   MRN: [id number]  Pt presents to the office today for chronic follow up.  Anxiety  Presents for follow-up visit. Symptoms include depressed mood, excessive worry, irritability, nervous/anxious behavior, panic and restlessness. Symptoms occur occasionally. The severity of symptoms is moderate. The quality of sleep is good.    Depression         This is a chronic problem.  The current episode started more than 1 year ago.   The onset quality is gradual.   The problem occurs intermittently.  Associated symptoms include fatigue, irritable, restlessness and sad.  Associated symptoms include no helplessness and no hopelessness.  Past medical history includes thyroid problem and anxiety.   Constipation  This is a chronic problem. The current episode started more than 1 year ago. The problem has been resolved since onset. She has tried laxatives for the symptoms. The treatment provided moderate relief.  Thyroid Problem  Presents for follow-up visit. Symptoms include anxiety, constipation, depressed mood and fatigue. The symptoms have been stable.  COPD Pt smoking "about 1 pack a day". Pt not taking any inhalers at this time.     Review of Systems  Constitutional: Positive for fatigue and irritability.  Gastrointestinal: Positive for constipation.  Psychiatric/Behavioral: Positive for depression. The patient is nervous/anxious.        Objective:   Physical Exam  Constitutional: She is oriented to person, place, and time. She appears well-developed and well-nourished. She is irritable. No distress.  HENT:  Head: Normocephalic and atraumatic.  Right Ear: External ear normal.  Left Ear: External ear normal.  Nose: Nose normal.  Mouth/Throat: Oropharynx is clear and moist.  Eyes: Pupils are equal, round, and reactive to light.  Neck: Normal range of motion. Neck supple. No thyromegaly present.    Cardiovascular: Normal rate, regular rhythm and intact distal pulses.  Murmur heard. Pulmonary/Chest: Effort normal. No respiratory distress. She has decreased breath sounds. She has no wheezes.  Abdominal: Soft. Bowel sounds are normal. She exhibits no distension. There is no tenderness.  Musculoskeletal: Normal range of motion. She exhibits no edema or tenderness.  Neurological: She is alert and oriented to person, place, and time. She has normal reflexes. No cranial nerve deficit.  Skin: Skin is warm and dry.  Psychiatric: She has a normal mood and affect. Her behavior is normal. Judgment and thought content normal.  Vitals reviewed.   BP 135/88   Pulse 74   Temp (!) 97.4 F (36.3 C) (Oral)   Ht '5\' 6"'  (1.676 m)   Wt 150 lb (68 kg)   BMI 24.21 kg/m      Assessment & Plan:  1. Hypothyroidism, unspecified type - CMP14+EGFR - TSH  2. Chronic obstructive pulmonary disease, unspecified COPD type ([place]) Will start pt on Breo today Smoking cessation discussed - CMP14+EGFR - fluticasone furoate-vilanterol (BREO ELLIPTA) 100-25 MCG/INH AEPB; Inhale 1 puff into the lungs daily.  Dispense: 1 each; Refill: 1  3. Moderate episode of recurrent major depressive disorder (H[facility]) - CMP14+EGFR - clonazePAM (KLONOPIN) 0.5 MG tablet; Take 1 tablet (0.5 mg total) by mouth at bedtime.  Dispense: 90 tablet; Refill: 1  4. GAD (generalized anxiety disorder) - CMP14+EGFR - clonazePAM (KLONOPIN) 0.5 MG tablet; Take 1 tablet (0.5 mg total) by mouth at bedtime.  Dispense: 90 tablet; Refill: 1  5. Constipation, unspecified constipation type - CMP14+EGFR  6. Tobacco use disorder - CMP14+EGFR  7.  Aortic atherosclerosis ([facility]CC) - CMP14+EGFR - Lipid panel - atorvastatin (LIPITOR) 10 MG tablet; Take 1 tablet (10 mg total) by mouth daily.  Dispense: 90 tablet; Refill: 3 - aspirin EC 81 MG tablet; Take 1 tablet (81 mg total) by mouth daily.  Dispense: 90 tablet; Refill: 1   Continue all meds Labs  pending Health Maintenance reviewed Diet and exercise encouraged RTO 4 months   Evelina Dun, FNP

## 2017-04-12 NOTE — Addendum Note (Signed)
Addended by: Evelina Dun A on: 04/12/2017 12:02 PM   Modules accepted: Orders

## 2017-04-12 NOTE — Patient Instructions (Signed)

## 2017-04-13 LAB — CMP14+EGFR
ALBUMIN: 4.6 g/dL (ref 3.6–4.8)
ALK PHOS: 65 IU/L (ref 39–117)
ALT: 13 IU/L (ref 0–32)
AST: 21 IU/L (ref 0–40)
Albumin/Globulin Ratio: 2 (ref 1.2–2.2)
BUN/Creatinine Ratio: 16 (ref 12–28)
BUN: 12 mg/dL (ref 8–27)
Bilirubin Total: 0.4 mg/dL (ref 0.0–1.2)
CHLORIDE: 94 mmol/L — AB (ref 96–106)
CO2: 22 mmol/L (ref 20–29)
CREATININE: 0.77 mg/dL (ref 0.57–1.00)
Calcium: 9.3 mg/dL (ref 8.7–10.3)
GFR calc Af Amer: 94 mL/min/{1.73_m2} (ref >59)
GFR calc non Af Amer: 82 mL/min/{1.73_m2} (ref >59)
GLUCOSE: 95 mg/dL (ref 65–99)
Globulin, Total: 2.3 g/dL (ref 1.5–4.5)
Potassium: 4.8 mmol/L (ref 3.5–5.2)
Sodium: 133 mmol/L — ABNORMAL LOW (ref 134–144)
Total Protein: 6.9 g/dL (ref 6.0–8.5)

## 2017-04-13 LAB — LIPID PANEL
CHOLESTEROL TOTAL: 206 mg/dL — AB (ref 100–199)
Chol/HDL Ratio: 2 ratio (ref 0.0–4.4)
HDL: 101 mg/dL (ref >39)
LDL CALC: 93 mg/dL (ref 0–99)
TRIGLYCERIDES: 62 mg/dL (ref 0–149)
VLDL Cholesterol Cal: 12 mg/dL (ref 5–40)

## 2017-04-13 LAB — TSH: TSH: 3.18 u[IU]/mL (ref 0.450–4.500)

## 2017-04-14 ENCOUNTER — Encounter: Payer: Self-pay | Admitting: Family

## 2017-05-10 ENCOUNTER — Ambulatory Visit (INDEPENDENT_AMBULATORY_CARE_PROVIDER_SITE_OTHER): Payer: Medicare HMO | Admitting: *Deleted

## 2017-05-10 VITALS — BP 134/76 | HR 68 | Ht 66.0 in | Wt 151.0 lb

## 2017-05-10 DIAGNOSIS — Z0000 Encounter for general adult medical examination without abnormal findings: Secondary | ICD-10-CM | POA: Diagnosis not present

## 2017-05-10 NOTE — Patient Instructions (Addendum)
Please bring our office a copy of your  Please work on increasing your exercise to walking 3 times per week for 30 minutes.    When you are ready to quit smoking please talk with Carolyn Dun, NP about medication options that may help you.  Advanced Directives to be filed in your chart.   Thank you for coming in for your annual wellness visit today!!   Preventive Care 40-64 Years, Female Preventive care refers to lifestyle choices and visits with your health care provider that can promote health and wellness. What does preventive care include?  A yearly physical exam. This is also called an annual well check.  Dental exams once or twice a year.  Routine eye exams. Ask your health care provider how often you should have your eyes checked.  Personal lifestyle choices, including: ? Daily care of your teeth and gums. ? Regular physical activity. ? Eating a healthy diet. ? Avoiding tobacco and drug use. ? Limiting alcohol use. ? Practicing safe sex. ? Taking low-dose aspirin daily starting at age 57. ? Taking vitamin and mineral supplements as recommended by your health care provider. What happens during an annual well check? The services and screenings done by your health care provider during your annual well check will depend on your age, overall health, lifestyle risk factors, and family history of disease. Counseling Your health care provider may ask you questions about your:  Alcohol use.  Tobacco use.  Drug use.  Emotional well-being.  Home and relationship well-being.  Sexual activity.  Eating habits.  Work and work Statistician.  Method of birth control.  Menstrual cycle.  Pregnancy history.  Screening You may have the following tests or measurements:  Height, weight, and BMI.  Blood pressure.  Lipid and cholesterol levels. These may be checked every 5 years, or more frequently if you are over 21 years old.  Skin check.  Lung cancer screening.  You may have this screening every year starting at age 42 if you have a 30-pack-year history of smoking and currently smoke or have quit within the past 15 years.  Fecal occult blood test (FOBT) of the stool. You may have this test every year starting at age 93.  Flexible sigmoidoscopy or colonoscopy. You may have a sigmoidoscopy every 5 years or a colonoscopy every 10 years starting at age 17.  Hepatitis C blood test.  Hepatitis B blood test.  Sexually transmitted disease (STD) testing.  Diabetes screening. This is done by checking your blood sugar (glucose) after you have not eaten for a while (fasting). You may have this done every 1-3 years.  Mammogram. This may be done every 1-2 years. Talk to your health care provider about when you should start having regular mammograms. This may depend on whether you have a family history of breast cancer.  BRCA-related cancer screening. This may be done if you have a family history of breast, ovarian, tubal, or peritoneal cancers.  Pelvic exam and Pap test. This may be done every 3 years starting at age 18. Starting at age 76, this may be done every 5 years if you have a Pap test in combination with an HPV test.  Bone density scan. This is done to screen for osteoporosis. You may have this scan if you are at high risk for osteoporosis.  Discuss your test results, treatment options, and if necessary, the need for more tests with your health care provider. Vaccines Your health care provider may recommend certain vaccines, such  as:  Influenza vaccine. This is recommended every year.  Tetanus, diphtheria, and acellular pertussis (Tdap, Td) vaccine. You may need a Td booster every 10 years.  Varicella vaccine. You may need this if you have not been vaccinated.  Zoster vaccine. You may need this after age 72.  Measles, mumps, and rubella (MMR) vaccine. You may need at least one dose of MMR if you were born in 1957 or later. You may also need a  second dose.  Pneumococcal 13-valent conjugate (PCV13) vaccine. You may need this if you have certain conditions and were not previously vaccinated.  Pneumococcal polysaccharide (PPSV23) vaccine. You may need one or two doses if you smoke cigarettes or if you have certain conditions.  Meningococcal vaccine. You may need this if you have certain conditions.  Hepatitis A vaccine. You may need this if you have certain conditions or if you travel or work in places where you may be exposed to hepatitis A.  Hepatitis B vaccine. You may need this if you have certain conditions or if you travel or work in places where you may be exposed to hepatitis B.  Haemophilus influenzae type b (Hib) vaccine. You may need this if you have certain conditions.  Talk to your health care provider about which screenings and vaccines you need and how often you need them. This information is not intended to replace advice given to you by your health care provider. Make sure you discuss any questions you have with your health care provider. Document Released: 04/19/2015 Document Revised: 12/11/2015 Document Reviewed: 01/22/2015 Elsevier Interactive Patient Education  [employer].

## 2017-05-11 NOTE — Progress Notes (Signed)
Subjective:   Carolyn [name] is a 67 y.o. female who presents for an Initial Medicare Annual Wellness Visit.  Carolyn [name] worked at [employer] in [place] until 2014 when she injured her shoulder and went out of work on disability.  She enjoys gardening, working in her yard and going to church.  She lives alone, but has family that lives locally that she is close with. She has a son and daughter, and 4 grandchildren.  She feels her health is better this year than it was last.  Carolyn [name] reports that she had bilateral cataract surgery within the past year.  She has had no ER visits or hospitalizations.   Review of Systems    Tightness in upper back and bilateral shoulders        Objective:    Today's Vitals   05/10/17 1314  BP: 134/76  Pulse: 68  Weight: 151 lb (68.5 kg)  Height: 5\' 6"  (1.676 m)  PainSc: 3   PainLoc: Shoulder   Body mass index is 24.37 kg/m.  Advanced Directives 05/10/2017 07/11/2015 08/15/2014 11/06/2013  Does Patient Have a Medical Advance Directive? Yes No;Yes No Patient would not like information  Type of Advance Directive Living will Living will - -  Does patient want to make changes to medical advance directive? - No - Patient declined - -  Copy of [place] in Chart? - No - copy requested - -  Would patient like information on creating a medical advance directive? - No - patient declined information Yes - Educational materials given -    Current Medications (verified) Outpatient Encounter Medications as of 05/10/2017  Medication Sig  . aspirin EC 81 MG tablet Take 1 tablet (81 mg total) by mouth daily.  Marland Kitchen atorvastatin (LIPITOR) 10 MG tablet Take 1 tablet (10 mg total) by mouth daily.  . baclofen (LIORESAL) 10 MG tablet Take 1 tablet (10 mg total) by mouth 3 (three) times daily. (Patient taking differently: Take 10 mg by mouth 3 (three) times daily as needed. )  . Calcium Carbonate (CALCIUM 600 PO) Take by mouth.  . citalopram  (CELEXA) 40 MG tablet TAKE 1 TABLET BY MOUTH EVERY DAY  . clonazePAM (KLONOPIN) 0.5 MG tablet Take 1 tablet (0.5 mg total) by mouth at bedtime.  . Cyanocobalamin (VITAMIN B 12 PO) Take 1,000 mcg by mouth daily.  . fluticasone (FLONASE) 50 MCG/ACT nasal spray Place 1 spray daily into both nostrils.  . Garlic 3810 MG CAPS Take by mouth.  . levothyroxine (SYNTHROID, LEVOTHROID) 88 MCG tablet TAKE 1 TABLET BY MOUTH EVERY DAY BEFORE BREAKFAST  . Multiple Vitamins-Minerals (CENTRUM SILVER ADULT 50+ PO) Take 1 tablet by mouth daily.  . Omega-3 Fatty Acids (FISH OIL) 1000 MG CAPS Take by mouth.  Marland Kitchen rOPINIRole (REQUIP) 3 MG tablet TAKE 1 TABLET BY MOUTH AT BEDTIME.  . fluticasone furoate-vilanterol (BREO ELLIPTA) 100-25 MCG/INH AEPB Inhale 1 puff into the lungs daily. (Patient not taking: Reported on 05/10/2017)   No facility-administered encounter medications on file as of 05/10/2017.     Allergies (verified) Azithromycin   History: Past Medical History:  Diagnosis Date  . Cervical cancer ([place])   . Depression   . Melanoma ([place])   . Thyroid disease    Past Surgical History:  Procedure Laterality Date  . CERVICAL CONIZATION W/BX    . EYE SURGERY Bilateral    Cataracts  . MELANOMA EXCISION  08/2014   back  . SHOULDER SURGERY Right  Family History  Problem Relation Age of Onset  . Heart disease Mother   . Aneurysm Mother   . Heart disease Father   . Kidney failure Father    Social History   Socioeconomic History  . Marital status: Divorced    Spouse name: None  . Number of children: None  . Years of education: None  . Highest education level: None  Social Needs  . Financial resource strain: None  . Food insecurity - worry: None  . Food insecurity - inability: None  . Transportation needs - medical: None  . Transportation needs - non-medical: None  Occupational History  . None  Tobacco Use  . Smoking status: Current Every Day Smoker    Packs/day: 1.00    Types: Cigarettes    . Smokeless tobacco: Never Used  Substance and Sexual Activity  . Alcohol use: No  . Drug use: No  . Sexual activity: No    Partners: Male    Birth control/protection: Post-menopausal  Other Topics Concern  . None  Social History Narrative  . None    Tobacco Counseling Ready to quit: No Counseling given: Yes   Clinical Intake:     Pain Score: 3    Bilateral shoulder pain                  Activities of Daily Living In your present state of health, do you have any difficulty performing the following activities: 05/10/2017  Hearing? N  Vision? N  Difficulty concentrating or making decisions? N  Walking or climbing stairs? N  Dressing or bathing? N  Doing errands, shopping? N  Preparing Food and eating ? N  Using the Toilet? N  In the past six months, have you accidently leaked urine? Y  Comment Has urine leakage if she does not make it to restroom in time  Do you have problems with loss of bowel control? N  Managing your Medications? N  Managing your Finances? N  Housekeeping or managing your Housekeeping? N  Some recent data might be hidden     Immunizations and Health Maintenance Immunization History  Administered Date(s) Administered  . Influenza-Unspecified 11/26/2015, 11/21/2016  . Pneumococcal Polysaccharide-23 05/07/2010  . Pneumococcal-Unspecified 06/02/2015  . Tdap 06/02/2015  . Zoster 11/26/2015   There are no preventive care reminders to display for this patient.  Patient Care Team: Sharion Balloon, FNP as PCP - General (Family Medicine) System, Pcp Not In Netta Cedars, MD as Consulting Physician (Orthopedic Surgery) Mora Bellman, MD as Consulting Physician (Obstetrics and Gynecology)      Assessment:   This is a routine wellness examination for Carolyn [name].  Hearing/Vision screen No hearing deficit noted  She is seen by [facility] in [place], [place]- at this point she only wears reading glasses because she her vision is  improved after having bilateral cataract surgery within the last year.  Dietary issues and exercise activities discussed:    Goals    . Exercise 3x per week (30 min per time)     Increase walking to 3 times per week for 30 minutes        Patient tries to eat mostly lean proteins, vegetables, fruits, and whole grains.  She recently started using an air fryer to decrease her fat intake.   Depression Screen PHQ 2/9 Scores 05/11/2017 04/12/2017 02/10/2017 01/26/2017 10/06/2016 08/04/2016 06/18/2016  PHQ - 2 Score 0 1 2 4  0 0 2  PHQ- 9 Score - - 5 5 - -  11    Fall Risk Fall Risk  05/11/2017 04/12/2017 02/10/2017 10/06/2016 08/04/2016  Falls in the past year? No No No Yes No  Number falls in past yr: - - - 1 -  Injury with Fall? - - - No -    Is the patient's home free of loose throw rugs in walkways, pet beds, electrical cords, etc?   yes      Grab bars in the bathroom? no      Handrails on the stairs?   no      Adequate lighting?   yes    Cognitive Function: MMSE - Mini Mental State Exam 05/11/2017  Orientation to time 5  Orientation to Place 5  Registration 3  Attention/ Calculation 3  Recall 3  Language- name 2 objects 2  Language- repeat 1  Language- follow 3 step command 3  Language- read & follow direction 1  Write a sentence 1  Copy design 1  Total score 28        Screening Tests Health Maintenance  Topic Date Due  . MAMMOGRAM  10/20/2018  . PAP SMEAR  01/27/2020  . TETANUS/TDAP  06/01/2025  . COLONOSCOPY  09/10/2025  . INFLUENZA VACCINE  Completed  . Hepatitis C Screening  Completed  . HIV Screening  Completed    Qualifies for Shingles Vaccine? Yes, discussed with patient.  She declined getting the vaccine today.  Cancer Screenings: Lung: Low Dose CT Chest recommended if Age 65-80 years, 30 pack-year currently smoking OR have quit w/in 15years. Patient does qualify. Breast: Up to date on Mammogram? Yes   Up to date of Bone Density/Dexa? Yes Colorectal: up to  date  Additional Screenings: Hepatitis B/HIV/Syphillis: done Hepatitis C Screening: done     Plan:     Please bring our office a copy of your Advanced Directives to be filed in your chart.  Work on increasing exercise to walking 3 times per week for 30 minutes.   When you are ready to quit smoking talk with Evelina Dun, NP about medication options that may help you.        I have personally reviewed and noted the following in the patient's chart:   . Medical and social history . Use of alcohol, tobacco or illicit drugs  . Current medications and supplements . Functional ability and status . Nutritional status . Physical activity . Advanced directives . List of other physicians . Hospitalizations, surgeries, and ER visits in previous 12 months . Vitals . Screenings to include cognitive, depression, and falls . Referrals and appointments  In addition, I have reviewed and discussed with patient certain preventive protocols, quality metrics, and best practice recommendations. A written personalized care plan for preventive services as well as general preventive health recommendations were provided to patient.     Denyce Robert, RN   05/11/2017

## 2017-06-14 ENCOUNTER — Other Ambulatory Visit: Payer: Self-pay | Admitting: *Deleted

## 2017-06-14 DIAGNOSIS — F411 Generalized anxiety disorder: Secondary | ICD-10-CM

## 2017-06-14 DIAGNOSIS — F331 Major depressive disorder, recurrent, moderate: Secondary | ICD-10-CM

## 2017-06-14 MED ORDER — ROPINIROLE HCL 3 MG PO TABS: 3.0000 mg | ORAL_TABLET | Freq: Every day | ORAL | 0 refills | Status: DC

## 2017-06-14 MED ORDER — CITALOPRAM HYDROBROMIDE 40 MG PO TABS: 40.0000 mg | ORAL_TABLET | Freq: Every day | ORAL | 0 refills | Status: DC

## 2017-06-14 MED ORDER — LEVOTHYROXINE SODIUM 88 MCG PO TABS: ORAL_TABLET | ORAL | 2 refills | Status: DC

## 2017-06-14 NOTE — Addendum Note (Signed)
Addended by: Antonietta Barcelona D on: 06/14/2017 03:25 PM   Modules accepted: Orders

## 2017-07-28 ENCOUNTER — Ambulatory Visit (INDEPENDENT_AMBULATORY_CARE_PROVIDER_SITE_OTHER): Payer: Medicare HMO | Admitting: Pediatrics

## 2017-07-28 ENCOUNTER — Encounter: Payer: Self-pay | Admitting: Pediatrics

## 2017-07-28 VITALS — BP 131/83 | HR 70 | Temp 97.3°F | Resp 22 | Ht 66.0 in | Wt 144.8 lb

## 2017-07-28 DIAGNOSIS — J441 Chronic obstructive pulmonary disease with (acute) exacerbation: Secondary | ICD-10-CM | POA: Diagnosis not present

## 2017-07-28 MED ORDER — LEVOFLOXACIN 500 MG PO TABS: 500.0000 mg | ORAL_TABLET | Freq: Every day | ORAL | 0 refills | Status: DC

## 2017-07-28 MED ORDER — PREDNISONE 20 MG PO TABS: ORAL_TABLET | ORAL | 0 refills | Status: DC

## 2017-07-28 NOTE — Progress Notes (Signed)
  Subjective:   Patient ID: Carolyn [name], female    DOB: [birth year], 67 y.o.   MRN: [id number] CC: Cough; Nasal Congestion; and Sinus pressure  HPI: Carolyn [name] is a 67 y.o. female presenting for Cough; Nasal Congestion; and Sinus pressure  Ongoing for about 3 weeks.  Not getting any better.  Feels like she has been wheezing more.  Trying to cut back on her cigarette use.  Took albuterol twice yesterday with some improvement in her breathing.  Cough bothering her the most.  Sometimes productive.  Feeling short of breath at times.  Appetite has been down.  Ongoing stress at home.  Relevant past medical, surgical, family and social history reviewed. Allergies and medications reviewed and updated. Social History   Tobacco Use  Smoking Status Current Every Day Smoker  . Packs/day: 1.00  . Types: Cigarettes  Smokeless Tobacco Never Used   ROS: Per HPI   Objective:    BP 131/83   Pulse 70   Temp (!) 97.3 F (36.3 C) (Oral)   Resp (!) 22   Ht 5\' 6"  (1.676 m)   Wt 144 lb 12.8 oz (65.7 kg)   SpO2 99%   BMI 23.37 kg/m   Wt Readings from Last 3 Encounters:  07/28/17 144 lb 12.8 oz (65.7 kg)  05/10/17 151 lb (68.5 kg)  04/12/17 150 lb (68 kg)    Gen: NAD, alert, cooperative with exam, NCAT EYES: EOMI, no conjunctival injection, or no icterus ENT:  TMs dull gray b/l, OP without erythema LYMPH: no cervical LAD CV: NRRR, normal S1/S2, no murmur, distal pulses 2+ b/l Resp: Wheezing throughout with exhalation.  Moving air fair.  Speaking in long phrases, slightly tachypneic, otherwise comfortable WOB Abd: +BS, soft, NTND. Ext: No edema, warm Neuro: Alert and oriented, strength equal b/l UE and LE, coordination grossly normal MSK: normal muscle bulk  Assessment & Plan:  Shirel was seen today for cough, nasal congestion and sinus pressure.  Diagnoses and all orders for this visit:  COPD exacerbation ([place]) Use albuterol every 4 hours as needed.  At least 4 times daily for  the next few days.  Start below.  Any worsening needs to be seen. -     levofloxacin (LEVAQUIN) 500 MG tablet; Take 1 tablet (500 mg total) by mouth daily. -     predniSONE (DELTASONE) 20 MG tablet; 2 po at same time daily for 5 days   Follow up plan: Return in about 1 month (around 08/25/2017). Assunta Found, MD [place]

## 2017-08-03 ENCOUNTER — Telehealth: Payer: Self-pay | Admitting: Family

## 2017-08-03 ENCOUNTER — Other Ambulatory Visit: Payer: Self-pay | Admitting: Pediatrics

## 2017-08-03 DIAGNOSIS — J441 Chronic obstructive pulmonary disease with (acute) exacerbation: Secondary | ICD-10-CM

## 2017-08-03 MED ORDER — DOXYCYCLINE HYCLATE 100 MG PO TABS: 100.0000 mg | ORAL_TABLET | Freq: Two times a day (BID) | ORAL | 0 refills | Status: DC

## 2017-08-03 MED ORDER — PREDNISONE 10 MG (21) PO TBPK: ORAL_TABLET | ORAL | 0 refills | Status: DC

## 2017-08-03 NOTE — Addendum Note (Signed)
Addended by: Evelina Dun A on: 08/03/2017 04:34 PM   Modules accepted: Orders

## 2017-08-03 NOTE — Telephone Encounter (Signed)
Patient seen Dr. Evette Doffing 4/24 for COPD exacerbation. Was given levaquin and is requesting a refill on it. States that her cough is breaking up but she is still coughing up yellow. Please advise and send if approved.

## 2017-08-03 NOTE — Telephone Encounter (Signed)
Ok, I will send in doxycyline since she completed the Levaquin. I had already sent in prednisone she just not pick this rx up.

## 2017-08-03 NOTE — Telephone Encounter (Signed)
Detailed message left for patient.

## 2017-08-03 NOTE — Telephone Encounter (Signed)
Sent in another round of prednisone. If symptoms worsen or do not improve will need to be seen. May need chest x-ray.

## 2017-08-03 NOTE — Telephone Encounter (Signed)
Patient states she is wanting a refill on the antibiotic not the prednisone. Patients daughter just passed away 07/31/22 morning.

## 2017-09-15 ENCOUNTER — Other Ambulatory Visit: Payer: Self-pay | Admitting: Family Medicine

## 2017-09-20 ENCOUNTER — Other Ambulatory Visit: Payer: Self-pay | Admitting: *Deleted

## 2017-09-20 DIAGNOSIS — F411 Generalized anxiety disorder: Secondary | ICD-10-CM

## 2017-09-20 DIAGNOSIS — I700 Atherosclerosis of aorta: Secondary | ICD-10-CM

## 2017-09-20 DIAGNOSIS — F331 Major depressive disorder, recurrent, moderate: Secondary | ICD-10-CM

## 2017-09-20 MED ORDER — ASPIRIN EC 81 MG PO TBEC: 81.0000 mg | DELAYED_RELEASE_TABLET | Freq: Every day | ORAL | 1 refills | Status: DC

## 2017-09-20 MED ORDER — CITALOPRAM HYDROBROMIDE 40 MG PO TABS: 40.0000 mg | ORAL_TABLET | Freq: Every day | ORAL | 0 refills | Status: DC

## 2017-09-24 ENCOUNTER — Other Ambulatory Visit: Payer: Self-pay | Admitting: Family

## 2017-09-24 DIAGNOSIS — F331 Major depressive disorder, recurrent, moderate: Secondary | ICD-10-CM

## 2017-09-24 DIAGNOSIS — F411 Generalized anxiety disorder: Secondary | ICD-10-CM

## 2017-10-10 ENCOUNTER — Other Ambulatory Visit: Payer: Self-pay | Admitting: Family

## 2017-10-11 ENCOUNTER — Encounter: Payer: Self-pay | Admitting: Family

## 2017-10-11 ENCOUNTER — Ambulatory Visit (INDEPENDENT_AMBULATORY_CARE_PROVIDER_SITE_OTHER): Payer: Medicare HMO | Admitting: Family

## 2017-10-11 VITALS — BP 121/73 | HR 64 | Temp 98.0°F | Ht 66.0 in | Wt 142.4 lb

## 2017-10-11 DIAGNOSIS — R911 Solitary pulmonary nodule: Secondary | ICD-10-CM

## 2017-10-11 DIAGNOSIS — E039 Hypothyroidism, unspecified: Secondary | ICD-10-CM | POA: Diagnosis not present

## 2017-10-11 DIAGNOSIS — I700 Atherosclerosis of aorta: Secondary | ICD-10-CM

## 2017-10-11 DIAGNOSIS — K5900 Constipation, unspecified: Secondary | ICD-10-CM | POA: Diagnosis not present

## 2017-10-11 DIAGNOSIS — F331 Major depressive disorder, recurrent, moderate: Secondary | ICD-10-CM | POA: Diagnosis not present

## 2017-10-11 DIAGNOSIS — J449 Chronic obstructive pulmonary disease, unspecified: Secondary | ICD-10-CM

## 2017-10-11 DIAGNOSIS — G2581 Restless legs syndrome: Secondary | ICD-10-CM | POA: Diagnosis not present

## 2017-10-11 DIAGNOSIS — R69 Illness, unspecified: Secondary | ICD-10-CM | POA: Diagnosis not present

## 2017-10-11 DIAGNOSIS — F411 Generalized anxiety disorder: Secondary | ICD-10-CM | POA: Diagnosis not present

## 2017-10-11 DIAGNOSIS — F17200 Nicotine dependence, unspecified, uncomplicated: Secondary | ICD-10-CM

## 2017-10-11 MED ORDER — ROPINIROLE HCL 3 MG PO TABS: 3.0000 mg | ORAL_TABLET | Freq: Every day | ORAL | 1 refills | Status: DC

## 2017-10-11 MED ORDER — CLONAZEPAM 0.5 MG PO TABS: 0.5000 mg | ORAL_TABLET | Freq: Every day | ORAL | 1 refills | Status: DC

## 2017-10-11 NOTE — Patient Instructions (Signed)

## 2017-10-11 NOTE — Progress Notes (Addendum)
Subjective:    Patient ID: Carolyn [name], female    DOB: 21-Feb-1951, 67 y.o.   MRN: [id number]  Chief Complaint  Patient presents with  . Medical Management of Chronic Issues    six month recheck    Thyroid Problem  Presents for follow-up visit. Symptoms include anxiety, constipation and dry skin. Patient reports no depressed mood, diarrhea, fatigue or hoarse voice. The symptoms have been stable. Her past medical history is significant for hyperlipidemia.  Anxiety  Presents for follow-up visit. Symptoms include excessive worry, irritability and nervous/anxious behavior. Patient reports no decreased concentration, depressed mood, insomnia or restlessness. Symptoms occur constantly. The severity of symptoms is moderate. The quality of sleep is good.    Depression         This is a chronic problem.  The current episode started more than 1 year ago.   The onset quality is gradual.   The problem occurs intermittently.  The problem has been waxing and waning since onset.  Associated symptoms include sad.  Associated symptoms include no decreased concentration, no fatigue, does not have insomnia, not irritable and no restlessness.  Past treatments include SSRIs - Selective serotonin reuptake inhibitors.  Past medical history includes thyroid problem and anxiety.   Hyperlipidemia  This is a chronic problem. The current episode started more than 1 year ago. The problem is controlled. Recent lipid tests were reviewed and are normal. Current antihyperlipidemic treatment includes statins. The current treatment provides moderate improvement of lipids. Risk factors for coronary artery disease include dyslipidemia, hypertension and a sedentary lifestyle.  Constipation  This is a chronic problem. The current episode started more than 1 year ago. The problem has been waxing and waning since onset. Her stool frequency is 1 time per day. Pertinent negatives include no diarrhea. She has tried laxatives for  the symptoms. The treatment provided moderate relief.  COPD Do well, taking Breo daily. States the heat is makes it worse.  RLS Taking Requip. States this helps some days and some days it doesn't.  Lung Nodule PT had several lung nodules on CT scan on 07/24/16 that recommended repeat CT scan in one year. States she wants to wait until 01/2018 when her insurance switches.   Review of Systems  Constitutional: Positive for irritability. Negative for fatigue.  HENT: Negative for hoarse voice.   Gastrointestinal: Positive for constipation. Negative for diarrhea.  Psychiatric/Behavioral: Positive for depression. Negative for decreased concentration. The patient is nervous/anxious. The patient does not have insomnia.   All other systems reviewed and are negative.      Objective:   Physical Exam  Constitutional: She is oriented to person, place, and time. She appears well-developed and well-nourished. She is not irritable. No distress.  HENT:  Head: Normocephalic and atraumatic.  Right Ear: External ear normal.  Left Ear: External ear normal.  Mouth/Throat: Oropharynx is clear and moist.  Eyes: Pupils are equal, round, and reactive to light.  Neck: Normal range of motion. Neck supple. No thyromegaly present.  Cardiovascular: Normal rate, regular rhythm, normal heart sounds and intact distal pulses.  No murmur heard. Pulmonary/Chest: Effort normal and breath sounds normal. No respiratory distress. She has no wheezes.  Abdominal: Soft. Bowel sounds are normal. She exhibits no distension. There is no tenderness.  Musculoskeletal: Normal range of motion. She exhibits no edema or tenderness.  Neurological: She is alert and oriented to person, place, and time. She has normal reflexes. No cranial nerve deficit.  Skin: Skin is warm and dry.  Psychiatric: She has a normal mood and affect. Her behavior is normal. Judgment and thought content normal.  Vitals reviewed.   BP 121/73   Pulse 64    Temp 98 F (36.7 C) (Oral)   Ht '5\' 6"'  (1.676 m)   Wt 142 lb 6.4 oz (64.6 kg)   BMI 22.98 kg/m        Assessment & Plan:  Carolyn [name] comes in today with chief complaint of Medical Management of Chronic Issues (six month recheck)   Diagnosis and orders addressed:  1. Aortic atherosclerosis ([facility]) - CMP14+EGFR  2. Moderate episode of recurrent major depressive disorder ([facility]) - clonazePAM (KLONOPIN) 0.5 MG tablet; Take 1 tablet (0.5 mg total) by mouth at bedtime.  Dispense: 90 tablet; Refill: 1 - CMP14+EGFR  3. GAD (generalized anxiety disorder) - clonazePAM (KLONOPIN) 0.5 MG tablet; Take 1 tablet (0.5 mg total) by mouth at bedtime.  Dispense: 90 tablet; Refill: 1 - CMP14+EGFR  4. Constipation, unspecified constipation type - CMP14+EGFR  5. Chronic obstructive pulmonary disease, unspecified COPD type ([place]) - CMP14+EGFR  6. Hypothyroidism, unspecified type - CMP14+EGFR  7. Tobacco use disorder - DG Chest 2 View; Future - CMP14+EGFR  8. Restless leg syndrome - rOPINIRole (REQUIP) 3 MG tablet; Take 1 tablet (3 mg total) by mouth at bedtime.  Dispense: 90 tablet; Refill: 1 - CMP14+EGFR  9. Pulmonary nodule Will do chest x-ray today since patient will not go to get CT scan until Oct.  - DG Chest 2 View; Future - CMP14+EGFR   Labs pending Health Maintenance reviewed Diet and exercise encouraged  Follow up plan: 6 months    Evelina Dun, FNP

## 2017-10-12 ENCOUNTER — Other Ambulatory Visit: Payer: Self-pay | Admitting: Family

## 2017-10-12 LAB — CMP14+EGFR
A/G RATIO: 2.1 (ref 1.2–2.2)
ALK PHOS: 58 IU/L (ref 39–117)
ALT: 12 IU/L (ref 0–32)
AST: 19 IU/L (ref 0–40)
Albumin: 4.4 g/dL (ref 3.6–4.8)
BUN/Creatinine Ratio: 11 — ABNORMAL LOW (ref 12–28)
BUN: 8 mg/dL (ref 8–27)
Bilirubin Total: 0.5 mg/dL (ref 0.0–1.2)
CO2: 25 mmol/L (ref 20–29)
Calcium: 9.4 mg/dL (ref 8.7–10.3)
Chloride: 94 mmol/L — ABNORMAL LOW (ref 96–106)
Creatinine, Ser: 0.73 mg/dL (ref 0.57–1.00)
GFR calc Af Amer: 101 mL/min/{1.73_m2} (ref >59)
GFR calc non Af Amer: 87 mL/min/{1.73_m2} (ref >59)
Globulin, Total: 2.1 g/dL (ref 1.5–4.5)
Glucose: 87 mg/dL (ref 65–99)
POTASSIUM: 4.6 mmol/L (ref 3.5–5.2)
Sodium: 132 mmol/L — ABNORMAL LOW (ref 134–144)
Total Protein: 6.5 g/dL (ref 6.0–8.5)

## 2017-10-15 ENCOUNTER — Ambulatory Visit (INDEPENDENT_AMBULATORY_CARE_PROVIDER_SITE_OTHER): Payer: Medicare HMO | Admitting: Family Medicine

## 2017-10-15 ENCOUNTER — Encounter: Payer: Self-pay | Admitting: Family Medicine

## 2017-10-15 ENCOUNTER — Telehealth: Payer: Self-pay | Admitting: Family

## 2017-10-15 VITALS — BP 136/76 | HR 70 | Temp 98.0°F | Ht 66.0 in | Wt 144.0 lb

## 2017-10-15 DIAGNOSIS — B372 Candidiasis of skin and nail: Secondary | ICD-10-CM | POA: Diagnosis not present

## 2017-10-15 MED ORDER — CLOTRIMAZOLE-BETAMETHASONE 1-0.05 % EX CREA: 1.0000 "application " | TOPICAL_CREAM | Freq: Two times a day (BID) | CUTANEOUS | 1 refills | Status: DC

## 2017-10-15 MED ORDER — FLUCONAZOLE 100 MG PO TABS: 100.0000 mg | ORAL_TABLET | Freq: Every day | ORAL | 0 refills | Status: DC

## 2017-10-15 NOTE — Telephone Encounter (Signed)
appt scheduled for evaluation 

## 2017-10-15 NOTE — Progress Notes (Signed)
Chief Complaint  Patient presents with  . rash with itching and stinging    HPI  Patient presents today for 2 weeks of rash with erythema up under the left breast and on her chest.  Has not responded to various over-the-counter creams.  She is concerned that this might be shingles.  PMH: Smoking status noted ROS: Per HPI  Objective: BP 136/76   Pulse 70   Temp 98 F (36.7 C) (Oral)   Ht 5\' 6"  (1.676 m)   Wt 144 lb (65.3 kg)   BMI 23.24 kg/m  Gen: NAD, alert, cooperative with exam HEENT: NCAT, EOMI, PERRL CV: RRR, good S1/S2, no murmur Resp: CTABL, no wheezes, non-labored Skin: There is a faint erythema that is not clearly dermatomal and may well across the midline but possibly could be herpetic just not yet clearly defined.  Due to location under the breast it appears more likely to be fungal. Ext: No edema, warm Neuro: Alert and oriented, No gross deficits  Assessment and plan:  No diagnosis found.  Meds ordered this encounter  Medications  . fluconazole (DIFLUCAN) 100 MG tablet    Sig: Take 1 tablet (100 mg total) by mouth daily.    Dispense:  15 tablet    Refill:  0  . clotrimazole-betamethasone (LOTRISONE) cream    Sig: Apply 1 application topically 2 (two) times daily. To affected areas until rash clears    Dispense:  45 g    Refill:  1    Follow up as needed.  Claretta Fraise, MD

## 2017-10-17 IMAGING — CT CT CHEST W/ CM
2 of 4 series · 12 of 36 positions shown, 15 images · IV contrast (Iodine)
Comparison: CT 10/13/2007

CLINICAL DATA: History pulmonary nodule.  Smoker.

EXAM:
CT CHEST WITH CONTRAST
TECHNIQUE: Multidetector CT imaging of the chest was performed during
intravenous contrast administration.
CONTRAST:  75mL [id number] IOPAMIDOL ([id number]) INJECTION 61%

[Series 201: chest with, idose (2) · axial · 0.71mm/px · z∈[+330,+630]mm · 9 of 140 slices shown, 12 images]
[im 10/140  mediastinal]
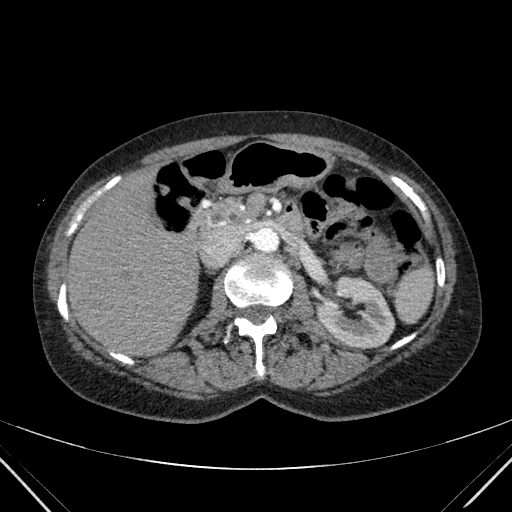
[im 10/140  lung]
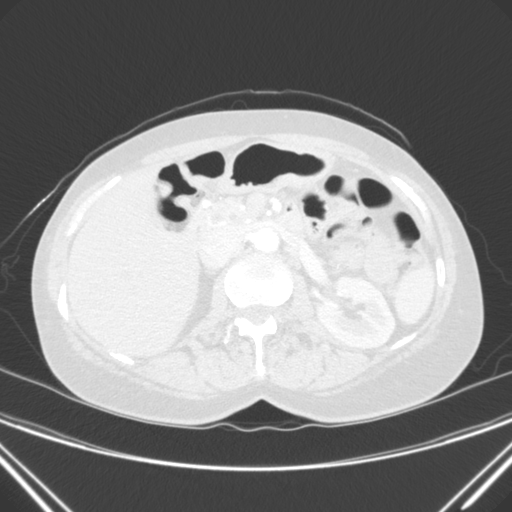
[im 30/140  lung]
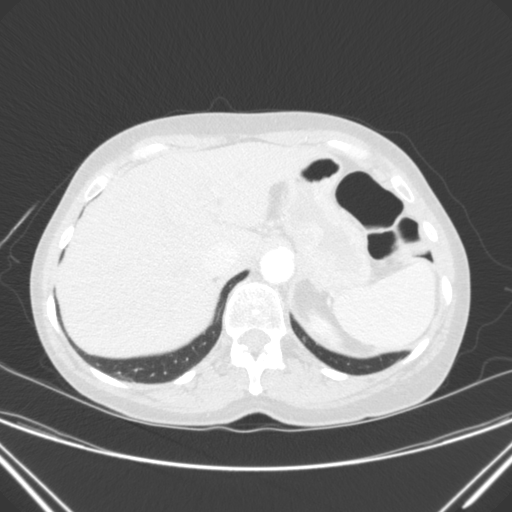
[im 40/140  lung]
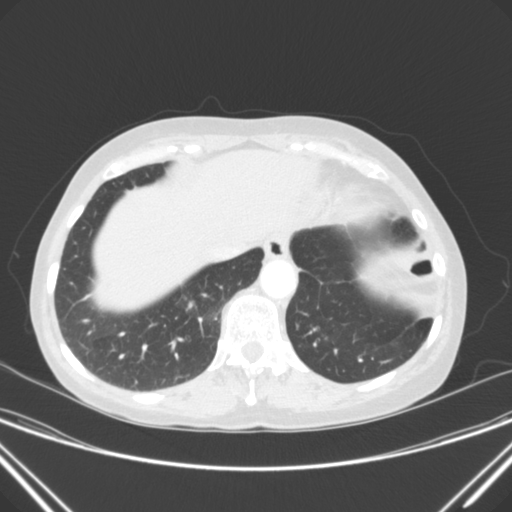
[im 60/140  lung]
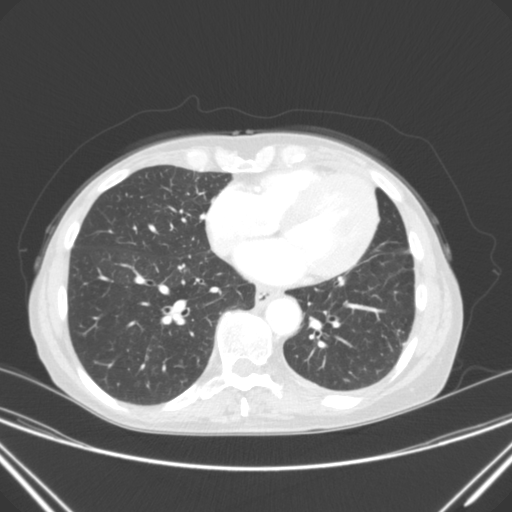
[im 70/140  mediastinal]
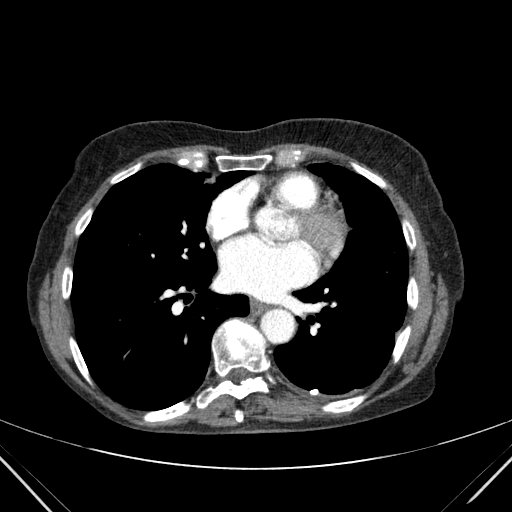
[im 70/140  lung]
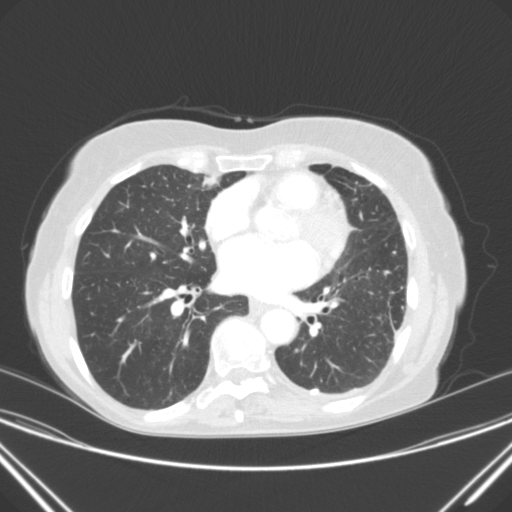
[im 80/140  lung]
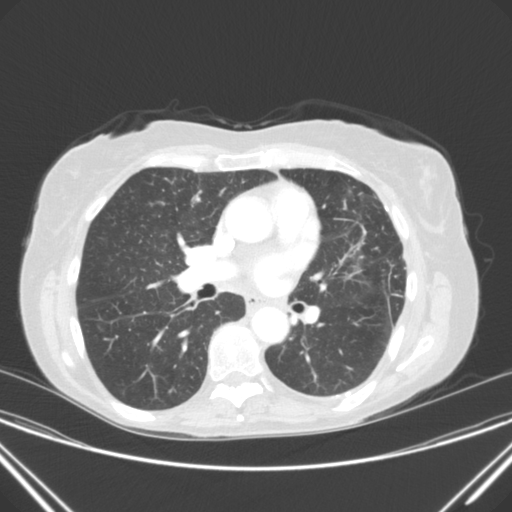
[im 100/140  lung]
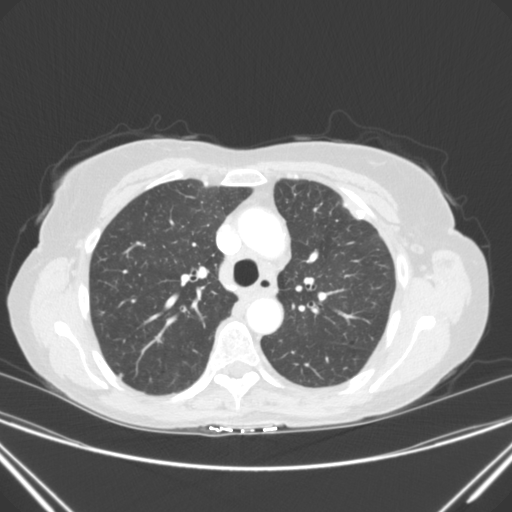
[im 110/140  lung]
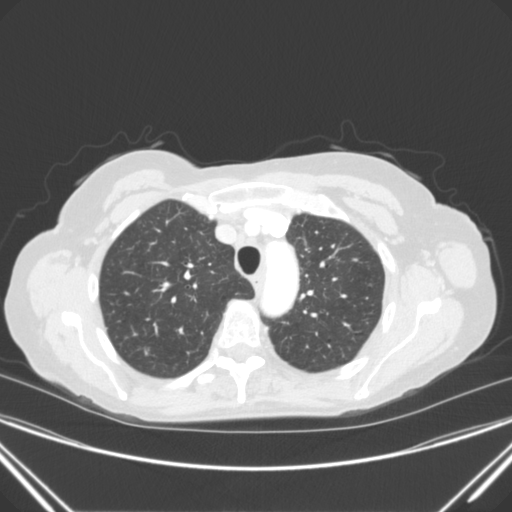
[im 130/140  mediastinal]
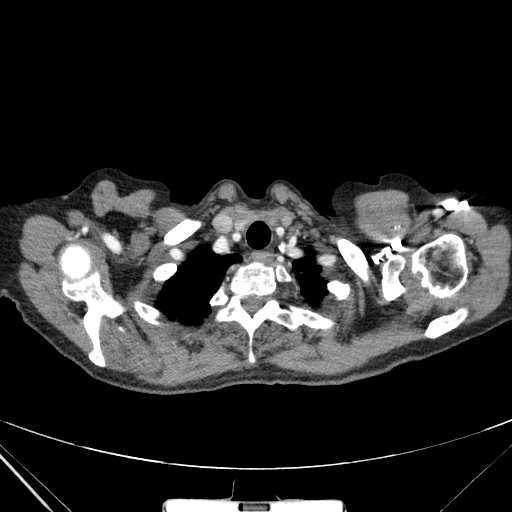
[im 130/140  lung]
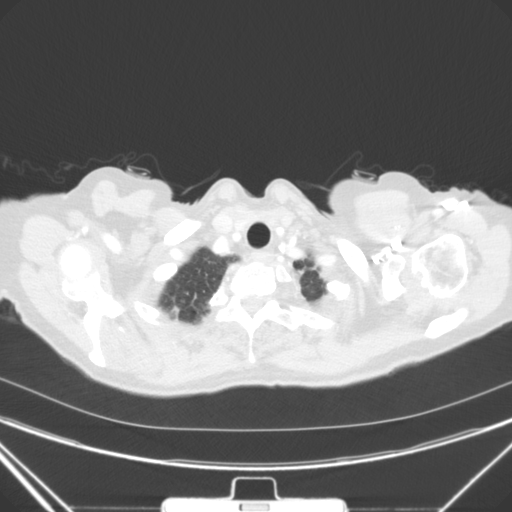

[Series 203: coronal, idose (2) · coronal · 0.45mm/px · 3 of 121 slices shown]
[im 25/121  lung]
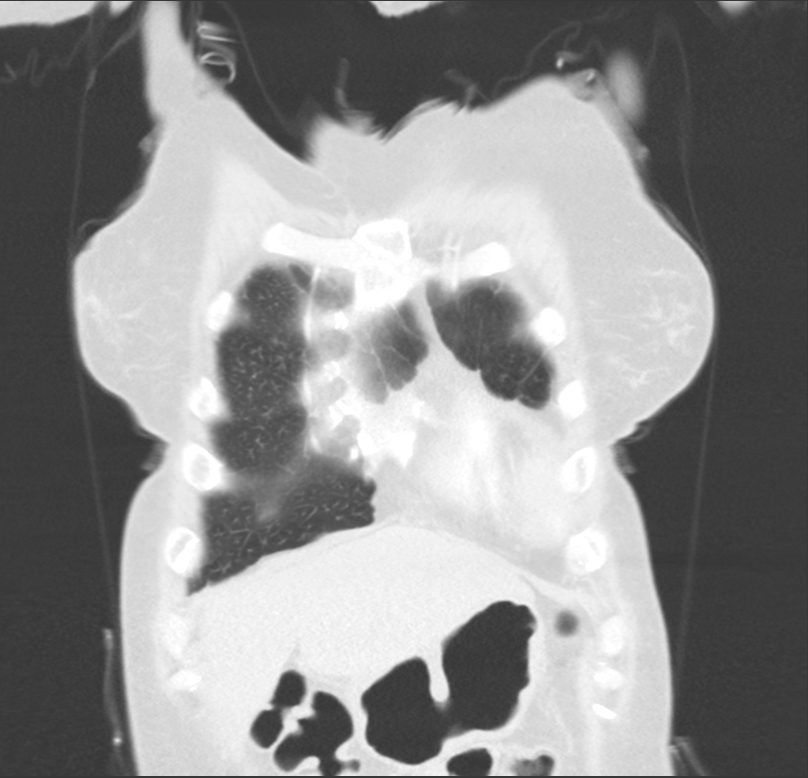
[im 49/121  lung]
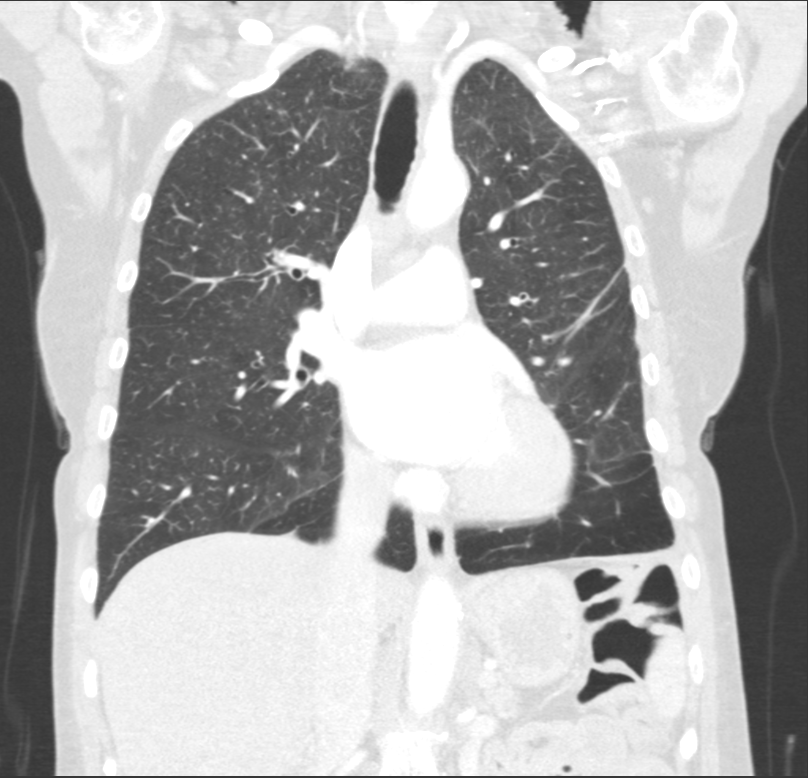
[im 73/121  lung]
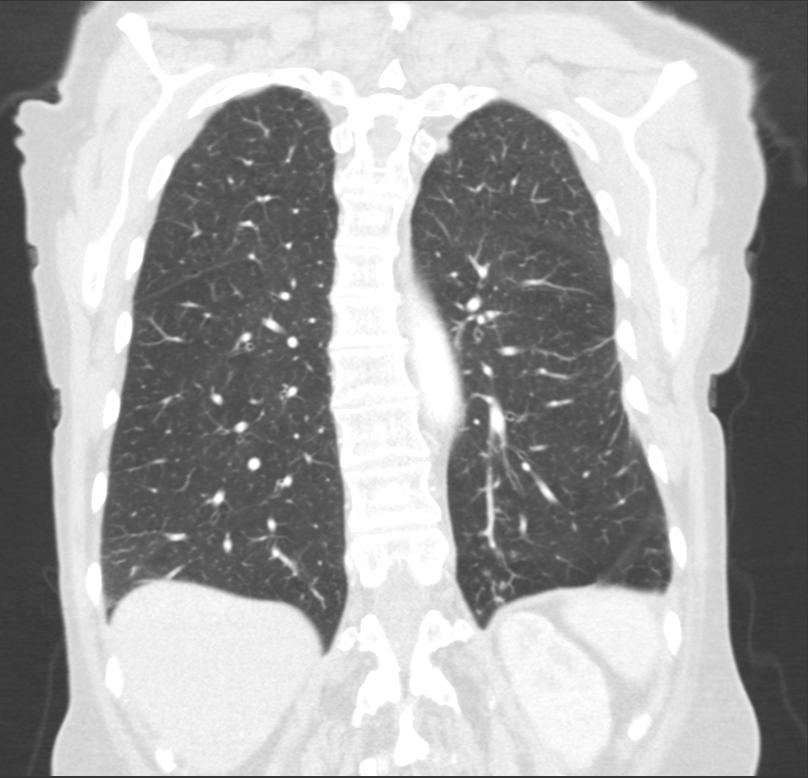

[12 of 36 positions shown; findings below may reference images not displayed]

FINDINGS: Cardiovascular: No significant vascular findings. Normal heart size.
No pericardial effusion.

Mediastinum/Nodes: No axillary or supraclavicular adenopathy. No
mediastinal. Small paratracheal lymph nodes are not pathologic size
criteria. Gas throughout esophagus. No obstructing lesion.

Lungs/Pleura: 6 mm nodule in the RIGHT upper lobe best seen on
comparison exam. 5 mm nodule in the RIGHT upper lobe on image 39,
series 205 is not changed. Nodular pleuroparenchymal thickening in
the medial RIGHT middle lobe (image 73, series 20).

In the LEFT upper lobe there is pleural plaques with partial
opacification (image 42). Linear scarring in the LEFT upper lobe is
unchanged. Lower lobe this unremarkable.

Upper Abdomen: Limited view of the liver, kidneys, pancreas are
unremarkable. Normal adrenal glands.

Musculoskeletal: No aggressive osseous lesion.
IMPRESSION: 1. Majority of the findings are stable including several pulmonary
nodules and calcified pleural plaques in the LEFT lower lobe.
2. At Least 1 nodule is new in the RIGHT upper lobe. In Patient with
smoking history recommend follow-up CT without contrast in 12 months
per Fleischner criteria.

## 2017-10-19 ENCOUNTER — Encounter: Payer: Self-pay | Admitting: Family Medicine

## 2017-10-19 DIAGNOSIS — E039 Hypothyroidism, unspecified: Secondary | ICD-10-CM | POA: Diagnosis not present

## 2017-10-19 DIAGNOSIS — K5900 Constipation, unspecified: Secondary | ICD-10-CM | POA: Diagnosis not present

## 2017-10-19 DIAGNOSIS — M1990 Unspecified osteoarthritis, unspecified site: Secondary | ICD-10-CM | POA: Diagnosis not present

## 2017-10-19 DIAGNOSIS — J449 Chronic obstructive pulmonary disease, unspecified: Secondary | ICD-10-CM | POA: Diagnosis not present

## 2017-10-19 DIAGNOSIS — G2581 Restless legs syndrome: Secondary | ICD-10-CM | POA: Diagnosis not present

## 2017-10-19 DIAGNOSIS — B372 Candidiasis of skin and nail: Secondary | ICD-10-CM | POA: Diagnosis not present

## 2017-10-19 DIAGNOSIS — E785 Hyperlipidemia, unspecified: Secondary | ICD-10-CM | POA: Diagnosis not present

## 2017-10-19 DIAGNOSIS — R69 Illness, unspecified: Secondary | ICD-10-CM | POA: Diagnosis not present

## 2017-11-10 ENCOUNTER — Other Ambulatory Visit: Payer: Self-pay | Admitting: Family

## 2017-11-10 DIAGNOSIS — J449 Chronic obstructive pulmonary disease, unspecified: Secondary | ICD-10-CM

## 2017-11-17 DIAGNOSIS — R69 Illness, unspecified: Secondary | ICD-10-CM | POA: Diagnosis not present

## 2017-12-09 ENCOUNTER — Other Ambulatory Visit: Payer: Self-pay | Admitting: Family

## 2017-12-10 ENCOUNTER — Telehealth: Payer: Self-pay | Admitting: Family

## 2017-12-10 ENCOUNTER — Encounter: Payer: Self-pay | Admitting: Physician Assistant

## 2017-12-10 ENCOUNTER — Ambulatory Visit (INDEPENDENT_AMBULATORY_CARE_PROVIDER_SITE_OTHER): Payer: Medicare HMO | Admitting: Physician Assistant

## 2017-12-10 VITALS — BP 118/72 | HR 89 | Temp 102.0°F | Ht 66.0 in | Wt 145.2 lb

## 2017-12-10 DIAGNOSIS — R509 Fever, unspecified: Secondary | ICD-10-CM | POA: Diagnosis not present

## 2017-12-10 DIAGNOSIS — J189 Pneumonia, unspecified organism: Secondary | ICD-10-CM

## 2017-12-10 DIAGNOSIS — R911 Solitary pulmonary nodule: Secondary | ICD-10-CM

## 2017-12-10 LAB — VERITOR FLU A/B WAIVED
INFLUENZA A: NEGATIVE
INFLUENZA B: NEGATIVE

## 2017-12-10 MED ORDER — CEFDINIR 300 MG PO CAPS
300.0000 mg | ORAL_CAPSULE | Freq: Two times a day (BID) | ORAL | 0 refills | Status: DC
Start: 2017-12-10 — End: 2018-04-15

## 2017-12-10 MED ORDER — METHYLPREDNISOLONE ACETATE 80 MG/ML IJ SUSP
80.0000 mg | Freq: Once | INTRAMUSCULAR | Status: AC
  Administered 2017-12-10: 80 mg via INTRAMUSCULAR

## 2017-12-10 NOTE — Addendum Note (Signed)
Addended by: Thana Ates on: 12/10/2017 03:58 PM   Modules accepted: Orders

## 2017-12-10 NOTE — Patient Instructions (Signed)
In a few days you may receive a survey in the mail or online from [employer] regarding your visit with us today. Please take a moment to fill this out. Your feedback is very important to our whole office. It can help us better understand your needs as well as improve your experience and satisfaction. Thank you for taking your time to complete it. We care about you.  Genova Kiner, PA-C  

## 2017-12-10 NOTE — Telephone Encounter (Signed)
CT scan ordered

## 2017-12-10 NOTE — Progress Notes (Signed)
BP 118/72   Pulse 89   Temp (!) 102 F (38.9 C) (Oral)   Ht _0  (1.676 m)   Wt 145 lb 3.2 oz (65.9 kg)   BMI 23.44 kg/m    Subjective:    Patient ID: Carolyn [name], female    DOB: 08/03/1950, 67 y.o.   MRN: [id number]  HPI: Carolyn [name] is a 67 y.o. female presenting on 12/10/2017 for Sinusitis; Generalized Body Aches; Cough; and Fever  Patient with several days of progressing upper respiratory and bronchial symptoms. Initially there was more upper respiratory congestion. This progressed to having significant cough that is productive throughout the day and severe at night. There is occasional wheezing after coughing. Sometimes there is slight dyspnea on exertion. It is productive mucus that is yellow in color. Denies any blood.   Past Medical History:  Diagnosis Date  . Cervical cancer ([place])   . Depression   . Melanoma ([place])   . Thyroid disease    Relevant past medical, surgical, family and social history reviewed and updated as indicated. Interim medical history since our last visit reviewed. Allergies and medications reviewed and updated. DATA REVIEWED: CHART IN EPIC  Family History reviewed for pertinent findings.  Review of Systems  Constitutional: Positive for chills, fatigue and fever. Negative for activity change and appetite change.  HENT: Positive for congestion, postnasal drip and sore throat.   Eyes: Negative.   Respiratory: Positive for cough and wheezing. Negative for shortness of breath.   Cardiovascular: Negative.  Negative for chest pain, palpitations and leg swelling.  Gastrointestinal: Negative.   Genitourinary: Negative.   Musculoskeletal: Positive for myalgias.  Skin: Negative.   Neurological: Positive for headaches.    Allergies as of 12/10/2017      Reactions   Azithromycin Rash      Medication List        Accurate as of 12/10/17 10:37 AM. Always use your most recent med list.          aspirin EC 81 MG tablet Take 1 tablet (81 mg  total) by mouth daily.   atorvastatin 10 MG tablet Commonly known as:  LIPITOR Take 1 tablet (10 mg total) by mouth daily.   baclofen 10 MG tablet Commonly known as:  LIORESAL TAKE 1 TABLET BY MOUTH THREE TIMES A DAY   BREO ELLIPTA 100-25 MCG/INH Aepb Generic drug:  fluticasone furoate-vilanterol INHALE 1 PUFF INTO THE LUNGS DAILY   CALCIUM 600 PO Take by mouth.   cefdinir 300 MG capsule Commonly known as:  OMNICEF Take 1 capsule (300 mg total) by mouth 2 (two) times daily.   CENTRUM SILVER ADULT 50+ PO Take 1 tablet by mouth daily.   citalopram 40 MG tablet Commonly known as:  CELEXA Take 1 tablet (40 mg total) by mouth daily.   clonazePAM 0.5 MG tablet Commonly known as:  KLONOPIN Take 1 tablet (0.5 mg total) by mouth at bedtime.   clotrimazole-betamethasone cream Commonly known as:  LOTRISONE Apply 1 application topically 2 (two) times daily. To affected areas until rash clears   Fish Oil 1000 MG Caps Take by mouth.   fluticasone 50 MCG/ACT nasal spray Commonly known as:  FLONASE PLACE 1 SPRAY DAILY INTO BOTH NOSTRILS.   Garlic 1856 MG Caps Take by mouth.   levothyroxine 88 MCG tablet Commonly known as:  SYNTHROID, LEVOTHROID TAKE 1 TABLET BY MOUTH EVERY DAY BEFORE BREAKFAST   rOPINIRole 3 MG tablet Commonly known as:  REQUIP TAKE 1 TABLET BY MOUTH  AT BEDTIME.   VITAMIN B 12 PO Take 1,000 mcg by mouth daily.          Objective:    BP 118/72   Pulse 89   Temp (!) 102 F (38.9 C) (Oral)   Ht _0  (1.676 m)   Wt 145 lb 3.2 oz (65.9 kg)   BMI 23.44 kg/m   Allergies  Allergen Reactions  . Azithromycin Rash    Wt Readings from Last 3 Encounters:  12/10/17 145 lb 3.2 oz (65.9 kg)  10/15/17 144 lb (65.3 kg)  10/11/17 142 lb 6.4 oz (64.6 kg)    Physical Exam  Constitutional: She is oriented to person, place, and time. She appears well-developed and well-nourished.  HENT:  Head: Normocephalic and atraumatic.  Right Ear: There is drainage  and tenderness.  Left Ear: There is drainage and tenderness.  Nose: Mucosal edema and rhinorrhea present. Right sinus exhibits no maxillary sinus tenderness and no frontal sinus tenderness. Left sinus exhibits no maxillary sinus tenderness and no frontal sinus tenderness.  Mouth/Throat: Oropharyngeal exudate and posterior oropharyngeal erythema present.  Eyes: Pupils are equal, round, and reactive to light. Conjunctivae and EOM are normal.  Neck: Normal range of motion. Neck supple.  Cardiovascular: Normal rate, regular rhythm, normal heart sounds and intact distal pulses.  Pulmonary/Chest: Effort normal. She has wheezes in the right upper field and the left upper field.  Abdominal: Soft. Bowel sounds are normal.  Neurological: She is alert and oriented to person, place, and time. She has normal reflexes.  Skin: Skin is warm and dry. No rash noted.  Psychiatric: She has a normal mood and affect. Her behavior is normal. Judgment and thought content normal.    Results for orders placed or performed in visit on 10/11/17  CMP14+EGFR  Result Value Ref Range   Glucose 87 65 - 99 mg/dL   BUN 8 8 - 27 mg/dL   Creatinine, Ser 0.73 0.57 - 1.00 mg/dL   GFR calc non Af Amer 87 >59 mL/min/1.73   GFR calc Af Amer 101 >59 mL/min/1.73   BUN/Creatinine Ratio 11 (L) 12 - 28   Sodium 132 (L) 134 - 144 mmol/L   Potassium 4.6 3.5 - 5.2 mmol/L   Chloride 94 (L) 96 - 106 mmol/L   CO2 25 20 - 29 mmol/L   Calcium 9.4 8.7 - 10.3 mg/dL   Total Protein 6.5 6.0 - 8.5 g/dL   Albumin 4.4 3.6 - 4.8 g/dL   Globulin, Total 2.1 1.5 - 4.5 g/dL   Albumin/Globulin Ratio 2.1 1.2 - 2.2   Bilirubin Total 0.5 0.0 - 1.2 mg/dL   Alkaline Phosphatase 58 39 - 117 IU/L   AST 19 0 - 40 IU/L   ALT 12 0 - 32 IU/L      Assessment & Plan:   1. Pneumonia due to infectious organism, unspecified laterality, unspecified part of lung - cefdinir (OMNICEF) 300 MG capsule; Take 1 capsule (300 mg total) by mouth 2 (two) times daily.   Dispense: 20 capsule; Refill: 0 - methylPREDNISolone acetate (DEPO-MEDROL) injection 80 mg   Continue all other maintenance medications as listed above.  Follow up plan: Return if symptoms worsen or fail to improve.  Educational handout given for [place] PA-C [place] [address]  [place], [place] [address] [phone]   12/10/2017, 10:37 AM

## 2017-12-10 NOTE — Telephone Encounter (Signed)
Last seen 10/15/17  Dr Currie Paris PCP

## 2017-12-10 NOTE — Telephone Encounter (Signed)
Patient had abnormal CT chest in April 2018, was supposed to repeat in a year.  She would like for [place] to set up appointment for sometime in October after new insurance starts.  Please advise.

## 2017-12-17 ENCOUNTER — Telehealth: Payer: Self-pay

## 2017-12-17 NOTE — Telephone Encounter (Signed)
Patient was a no show for CT scan

## 2017-12-20 ENCOUNTER — Other Ambulatory Visit: Payer: Self-pay | Admitting: Family

## 2017-12-20 DIAGNOSIS — F331 Major depressive disorder, recurrent, moderate: Secondary | ICD-10-CM

## 2017-12-20 DIAGNOSIS — F411 Generalized anxiety disorder: Secondary | ICD-10-CM

## 2017-12-27 ENCOUNTER — Telehealth: Payer: Self-pay | Admitting: Family

## 2017-12-28 NOTE — Telephone Encounter (Signed)
Rescheduled for 01/05/18

## 2018-01-02 ENCOUNTER — Other Ambulatory Visit: Payer: Self-pay | Admitting: Family

## 2018-01-02 DIAGNOSIS — J449 Chronic obstructive pulmonary disease, unspecified: Secondary | ICD-10-CM

## 2018-01-03 MED ORDER — FLUTICASONE FUROATE-VILANTEROL 100-25 MCG/INH IN AEPB: INHALATION_SPRAY | RESPIRATORY_TRACT | 5 refills | Status: DC

## 2018-01-03 NOTE — Telephone Encounter (Signed)
First refill failed, resent refill

## 2018-01-03 NOTE — Addendum Note (Signed)
Addended by: Antonietta Barcelona D on: 01/03/2018 10:45 AM   Modules accepted: Orders

## 2018-01-04 DIAGNOSIS — Z1231 Encounter for screening mammogram for malignant neoplasm of breast: Secondary | ICD-10-CM | POA: Diagnosis not present

## 2018-01-04 DIAGNOSIS — D229 Melanocytic nevi, unspecified: Secondary | ICD-10-CM | POA: Diagnosis not present

## 2018-01-04 DIAGNOSIS — D049 Carcinoma in situ of skin, unspecified: Secondary | ICD-10-CM | POA: Diagnosis not present

## 2018-01-04 DIAGNOSIS — L570 Actinic keratosis: Secondary | ICD-10-CM | POA: Diagnosis not present

## 2018-01-04 LAB — HM MAMMOGRAPHY

## 2018-01-05 ENCOUNTER — Ambulatory Visit (INDEPENDENT_AMBULATORY_CARE_PROVIDER_SITE_OTHER): Payer: Medicare Other

## 2018-01-05 DIAGNOSIS — R911 Solitary pulmonary nodule: Secondary | ICD-10-CM | POA: Diagnosis not present

## 2018-01-20 ENCOUNTER — Ambulatory Visit (INDEPENDENT_AMBULATORY_CARE_PROVIDER_SITE_OTHER): Payer: Medicare Other | Admitting: *Deleted

## 2018-01-20 DIAGNOSIS — Z23 Encounter for immunization: Secondary | ICD-10-CM | POA: Diagnosis not present

## 2018-03-02 ENCOUNTER — Other Ambulatory Visit: Payer: Self-pay | Admitting: Family

## 2018-03-02 DIAGNOSIS — I700 Atherosclerosis of aorta: Secondary | ICD-10-CM

## 2018-03-09 DIAGNOSIS — L821 Other seborrheic keratosis: Secondary | ICD-10-CM | POA: Diagnosis not present

## 2018-03-09 DIAGNOSIS — D049 Carcinoma in situ of skin, unspecified: Secondary | ICD-10-CM | POA: Diagnosis not present

## 2018-03-09 DIAGNOSIS — D229 Melanocytic nevi, unspecified: Secondary | ICD-10-CM | POA: Diagnosis not present

## 2018-03-14 ENCOUNTER — Other Ambulatory Visit: Payer: Self-pay | Admitting: Family

## 2018-03-14 DIAGNOSIS — F411 Generalized anxiety disorder: Secondary | ICD-10-CM

## 2018-03-14 DIAGNOSIS — F331 Major depressive disorder, recurrent, moderate: Secondary | ICD-10-CM

## 2018-03-14 DIAGNOSIS — I700 Atherosclerosis of aorta: Secondary | ICD-10-CM

## 2018-03-23 ENCOUNTER — Other Ambulatory Visit: Payer: Self-pay | Admitting: Family

## 2018-03-23 DIAGNOSIS — F331 Major depressive disorder, recurrent, moderate: Secondary | ICD-10-CM

## 2018-03-23 DIAGNOSIS — F411 Generalized anxiety disorder: Secondary | ICD-10-CM

## 2018-04-14 ENCOUNTER — Ambulatory Visit: Payer: Medicare HMO | Admitting: Family

## 2018-04-15 ENCOUNTER — Ambulatory Visit (INDEPENDENT_AMBULATORY_CARE_PROVIDER_SITE_OTHER): Payer: Medicare Other | Admitting: Physician Assistant

## 2018-04-15 ENCOUNTER — Encounter: Payer: Self-pay | Admitting: Physician Assistant

## 2018-04-15 VITALS — BP 115/74 | HR 68 | Temp 97.1°F | Ht 66.0 in | Wt 148.4 lb

## 2018-04-15 DIAGNOSIS — J209 Acute bronchitis, unspecified: Secondary | ICD-10-CM | POA: Insufficient documentation

## 2018-04-15 DIAGNOSIS — F411 Generalized anxiety disorder: Secondary | ICD-10-CM | POA: Diagnosis not present

## 2018-04-15 DIAGNOSIS — F331 Major depressive disorder, recurrent, moderate: Secondary | ICD-10-CM

## 2018-04-15 DIAGNOSIS — E039 Hypothyroidism, unspecified: Secondary | ICD-10-CM

## 2018-04-15 DIAGNOSIS — J440 Chronic obstructive pulmonary disease with acute lower respiratory infection: Secondary | ICD-10-CM | POA: Diagnosis not present

## 2018-04-15 MED ORDER — CEFDINIR 300 MG PO CAPS: 300.0000 mg | ORAL_CAPSULE | Freq: Two times a day (BID) | ORAL | 0 refills | Status: DC

## 2018-04-15 MED ORDER — METHYLPREDNISOLONE ACETATE 80 MG/ML IJ SUSP
80.0000 mg | Freq: Once | INTRAMUSCULAR | Status: AC
  Administered 2018-04-15: 80 mg via INTRAMUSCULAR

## 2018-04-15 MED ORDER — CLONAZEPAM 0.5 MG PO TABS: 0.5000 mg | ORAL_TABLET | Freq: Every day | ORAL | 1 refills | Status: DC

## 2018-04-15 NOTE — Progress Notes (Signed)
BP 115/74   Pulse 68   Temp (!) 97.1 F (36.2 C) (Oral)   Ht 5\' 6"  (1.676 m)   Wt 148 lb 6.4 oz (67.3 kg)   BMI 23.95 kg/m    Subjective:    Patient ID: Carolyn [name], female    DOB: 09/20/1950, 68 y.o.   MRN: [id number]  HPI: Carolyn [name] is a 68 y.o. female presenting on 04/15/2018 for Nasal Congestion (x 1 week. [place] is also here for a 6 month check up of chronic medical conditions); Cough; Facial Pain; and Establish Care (Would like to switch to [facility]  Patient with several days of progressing upper respiratory and bronchial symptoms. Initially there was more upper respiratory congestion. This progressed to having significant cough that is productive throughout the day and severe at night. There is occasional wheezing after coughing. Sometimes there is slight dyspnea on exertion. It is productive mucus that is yellow in color. Denies any blood.  She is also here for recheck a couple of her chronic medical conditions which include hypothyroidism, generalized anxiety, depression.  We will send refills in.  Past Medical History:  Diagnosis Date  . Cervical cancer ([place])   . Depression   . Melanoma ([place])   . Thyroid disease    Relevant past medical, surgical, family and social history reviewed and updated as indicated. Interim medical history since our last visit reviewed. Allergies and medications reviewed and updated. DATA REVIEWED: CHART IN EPIC  Family History reviewed for pertinent findings.  Review of Systems  Constitutional: Positive for chills and fatigue. Negative for activity change, appetite change and fever.  HENT: Positive for congestion, postnasal drip and sore throat.   Eyes: Negative.   Respiratory: Positive for cough and wheezing.   Cardiovascular: Negative.  Negative for chest pain, palpitations and leg swelling.  Gastrointestinal: Negative.   Genitourinary: Negative.   Musculoskeletal: Negative.   Skin: Negative.   Neurological: Positive for  headaches.    Allergies as of 04/15/2018      Reactions   Azithromycin Rash      Medication List       Accurate as of April 15, 2018 11:31 AM. Always use your most recent med list.        aspirin 81 MG EC tablet TAKE 1 TABLET BY MOUTH EVERY DAY   atorvastatin 10 MG tablet Commonly known as:  LIPITOR TAKE 1 TABLET BY MOUTH EVERY DAY   baclofen 10 MG tablet Commonly known as:  LIORESAL TAKE 1 TABLET BY MOUTH THREE TIMES A DAY   CALCIUM 600 PO Take by mouth.   cefdinir 300 MG capsule Commonly known as:  OMNICEF Take 1 capsule (300 mg total) by mouth 2 (two) times daily. 1 po BID   CENTRUM SILVER ADULT 50+ PO Take 1 tablet by mouth daily.   citalopram 40 MG tablet Commonly known as:  CELEXA TAKE 1 TABLET BY MOUTH EVERY DAY   clonazePAM 0.5 MG tablet Commonly known as:  KLONOPIN Take 1 tablet (0.5 mg total) by mouth at bedtime.   Fish Oil 1000 MG Caps Take by mouth.   fluticasone 50 MCG/ACT nasal spray Commonly known as:  FLONASE SPRAY 1 SPRAY INTO BOTH NOSTRILS DAILY   fluticasone furoate-vilanterol 100-25 MCG/INH Aepb Commonly known as:  BREO ELLIPTA TAKE 1 PUFF BY MOUTH EVERY DAY   Garlic 5053 MG Caps Take by mouth.   levothyroxine 88 MCG tablet Commonly known as:  SYNTHROID, LEVOTHROID TAKE 1 TABLET BY MOUTH EVERY DAY BEFORE  BREAKFAST   rOPINIRole 3 MG tablet Commonly known as:  REQUIP TAKE 1 TABLET BY MOUTH AT BEDTIME.   VITAMIN B 12 PO Take 1,000 mcg by mouth daily.          Objective:    BP 115/74   Pulse 68   Temp (!) 97.1 F (36.2 C) (Oral)   Ht 5\' 6"  (1.676 m)   Wt 148 lb 6.4 oz (67.3 kg)   BMI 23.95 kg/m   Allergies  Allergen Reactions  . Azithromycin Rash    Wt Readings from Last 3 Encounters:  04/15/18 148 lb 6.4 oz (67.3 kg)  12/10/17 145 lb 3.2 oz (65.9 kg)  10/15/17 144 lb (65.3 kg)    Physical Exam Constitutional:      Appearance: She is well-developed.  HENT:     Head: Normocephalic and atraumatic.      Right Ear: Drainage and tenderness present.     Left Ear: Drainage and tenderness present.     Nose: Mucosal edema and rhinorrhea present.     Right Sinus: No maxillary sinus tenderness or frontal sinus tenderness.     Left Sinus: No maxillary sinus tenderness or frontal sinus tenderness.     Mouth/Throat:     Pharynx: Oropharyngeal exudate and posterior oropharyngeal erythema present.  Eyes:     Conjunctiva/sclera: Conjunctivae normal.     Pupils: Pupils are equal, round, and reactive to light.  Neck:     Musculoskeletal: Normal range of motion and neck supple.  Cardiovascular:     Rate and Rhythm: Normal rate and regular rhythm.     Heart sounds: Normal heart sounds.  Pulmonary:     Effort: Pulmonary effort is normal.     Breath sounds: Examination of the right-upper field reveals wheezing. Examination of the left-upper field reveals wheezing. Wheezing present.  Abdominal:     General: Bowel sounds are normal.     Palpations: Abdomen is soft.  Skin:    General: Skin is warm and dry.     Findings: No rash.  Neurological:     Mental Status: She is alert and oriented to person, place, and time.     Deep Tendon Reflexes: Reflexes are normal and symmetric.  Psychiatric:        Behavior: Behavior normal.        Thought Content: Thought content normal.        Judgment: Judgment normal.         Assessment & Plan:   1. Acute bronchitis with COPD ([place]) - methylPREDNISolone acetate (DEPO-MEDROL) injection 80 mg - cefdinir (OMNICEF) 300 MG capsule; Take 1 capsule (300 mg total) by mouth 2 (two) times daily. 1 po BID  Dispense: 20 capsule; Refill: 0  2. Moderate episode of recurrent major depressive disorder (HCC) - clonazePAM (KLONOPIN) 0.5 MG tablet; Take 1 tablet (0.5 mg total) by mouth at bedtime.  Dispense: 90 tablet; Refill: 1  3. GAD (generalized anxiety disorder) - clonazePAM (KLONOPIN) 0.5 MG tablet; Take 1 tablet (0.5 mg total) by mouth at bedtime.  Dispense: 90 tablet;  Refill: 1  4. Hypothyroidism, unspecified type - TSH   Continue all other maintenance medications as listed above.  Follow up plan: Return in about 6 months (around 10/14/2018).  Educational handout given for [place] PA-C [place] [address]  [place], [place] [address] [phone]   04/15/2018, 11:31 AM

## 2018-04-16 LAB — TSH: TSH: 4.75 u[IU]/mL — ABNORMAL HIGH (ref 0.450–4.500)

## 2018-04-19 ENCOUNTER — Other Ambulatory Visit: Payer: Self-pay | Admitting: Physician Assistant

## 2018-04-19 MED ORDER — LEVOTHYROXINE SODIUM 100 MCG PO TABS: ORAL_TABLET | ORAL | 0 refills | Status: DC

## 2018-05-12 ENCOUNTER — Encounter: Payer: Self-pay | Admitting: *Deleted

## 2018-05-12 ENCOUNTER — Ambulatory Visit (INDEPENDENT_AMBULATORY_CARE_PROVIDER_SITE_OTHER): Payer: Medicare Other | Admitting: *Deleted

## 2018-05-12 VITALS — BP 130/76 | HR 61 | Ht 66.0 in | Wt 153.0 lb

## 2018-05-12 DIAGNOSIS — Z0000 Encounter for general adult medical examination without abnormal findings: Secondary | ICD-10-CM | POA: Diagnosis not present

## 2018-05-12 DIAGNOSIS — Z23 Encounter for immunization: Secondary | ICD-10-CM | POA: Diagnosis not present

## 2018-05-12 NOTE — Progress Notes (Signed)
Subjective:   Joselin Crandell is a 68 y.o. female who presents for a Subsequent Medicare Annual Wellness Visit.  Ms. Oglesby worked at [employer] in [place] until 2014 when she injured her shoulder and went out of work on disability.  She enjoys gardening, working in her yard and going to church.  She lives alone in a  house she bought within the past year in [place], [place].  She has a sister that lives close to her.  She has a son and daughter, and 4 grandchildren.   Patient Care Team: Theodoro Clock as PCP - General (Physician Assistant) Netta Cedars, MD as Consulting Physician (Orthopedic Surgery) Mora Bellman, MD as Consulting Physician (Obstetrics and Gynecology)  Hospitalizations, surgeries, and ER visits in previous 12 months No hospitalizations, ER visits, or surgeries this past year.   Review of Systems    Patient reports that her overall health is better compared to last year because her stress level is much improved.   Cardiac Risk Factors include: advanced age (>34men, >1 women);smoking/ tobacco exposure;sedentary lifestyle  Musculoskeletal - back pain  All other systems negative       Current Medications (verified) Outpatient Encounter Medications as of 05/12/2018  Medication Sig  . aspirin 81 MG EC tablet TAKE 1 TABLET BY MOUTH EVERY DAY  . atorvastatin (LIPITOR) 10 MG tablet TAKE 1 TABLET BY MOUTH EVERY DAY  . baclofen (LIORESAL) 10 MG tablet TAKE 1 TABLET BY MOUTH THREE TIMES A DAY  . Calcium Carbonate (CALCIUM 600 PO) Take by mouth.  . citalopram (CELEXA) 40 MG tablet TAKE 1 TABLET BY MOUTH EVERY DAY  . clonazePAM (KLONOPIN) 0.5 MG tablet Take 1 tablet (0.5 mg total) by mouth at bedtime.  . Cyanocobalamin (VITAMIN B 12 PO) Take 1,000 mcg by mouth daily.  . fluticasone (FLONASE) 50 MCG/ACT nasal spray SPRAY 1 SPRAY INTO BOTH NOSTRILS DAILY  . fluticasone furoate-vilanterol (BREO ELLIPTA) 100-25 MCG/INH AEPB TAKE 1 PUFF BY MOUTH EVERY DAY  .  Garlic 2831 MG CAPS Take by mouth.  . levothyroxine (SYNTHROID, LEVOTHROID) 100 MCG tablet TAKE 1 TABLET BY MOUTH EVERY DAY BEFORE BREAKFAST  . Multiple Vitamins-Minerals (CENTRUM SILVER ADULT 50+ PO) Take 1 tablet by mouth daily.  . Omega-3 Fatty Acids (FISH OIL) 1000 MG CAPS Take by mouth.  Marland Kitchen rOPINIRole (REQUIP) 3 MG tablet TAKE 1 TABLET BY MOUTH AT BEDTIME.  . [DISCONTINUED] cefdinir (OMNICEF) 300 MG capsule Take 1 capsule (300 mg total) by mouth 2 (two) times daily. 1 po BID   No facility-administered encounter medications on file as of 05/12/2018.     Allergies (verified) Azithromycin   History: Past Medical History:  Diagnosis Date  . Cervical cancer ([place])   . Depression   . Melanoma ([place])   . Thyroid disease    Past Surgical History:  Procedure Laterality Date  . CERVICAL CONIZATION W/BX    . EYE SURGERY Bilateral    Cataracts  . MELANOMA EXCISION  08/2014   back  . SHOULDER SURGERY Right    Family History  Problem Relation Age of Onset  . Heart disease Mother   . Aneurysm Mother   . Heart disease Father   . Kidney failure Father   . Cancer Sister   . Cancer Brother   . Cirrhosis Daughter   . Cancer Sister        Bronchial   Social History   Socioeconomic History  . Marital status: Divorced    Spouse name: Not on  file  . Number of children: 2  . Years of education: Not on file  . Highest education level: GED or equivalent  Occupational History  . Occupation: disabled  Social Needs  . Financial resource strain: Not hard at all  . Food insecurity:    Worry: Never true    Inability: Never true  . Transportation needs:    Medical: No    Non-medical: No  Tobacco Use  . Smoking status: Current Every Day Smoker    Packs/day: 1.00    Types: Cigarettes    Start date: 04/06/1968  . Smokeless tobacco: Never Used  Substance and Sexual Activity  . Alcohol use: No  . Drug use: No  . Sexual activity: Not on file  Lifestyle  . Physical activity:    Days per  week: 0 days    Minutes per session: 0 min  . Stress: Not at all  Relationships  . Social connections:    Talks on phone: More than three times a week    Gets together: More than three times a week    Attends religious service: More than 4 times per year    Active member of club or organization: No    Attends meetings of clubs or organizations: Never    Relationship status: Divorced  Other Topics Concern  . Not on file  Social History Narrative  . Not on file     Clinical Intake:     Pain Score: 2                   Activities of Daily Living In your present state of health, do you have any difficulty performing the following activities: 05/12/2018  Hearing? N  Vision? N  Difficulty concentrating or making decisions? N  Walking or climbing stairs? N  Dressing or bathing? N  Doing errands, shopping? N  Preparing Food and eating ? N  Using the Toilet? N  In the past six months, have you accidently leaked urine? N  Do you have problems with loss of bowel control? N  Managing your Medications? N  Managing your Finances? N  Housekeeping or managing your Housekeeping? N  Some recent data might be hidden     Exercise Current Exercise Habits: The patient does not participate in regular exercise at present  Diet Consumes 2 meals a day and 1 snacks a day.  Patient has access to adequate food.  The patient feels that they mostly follow a Regular diet.  Diet History Patient does not a significant amount of fruits and vegetables.  Encouraged her to increase these in her diet.   Depression Screen PHQ 2/9 Scores 05/12/2018 04/15/2018 12/10/2017 10/15/2017 10/11/2017 07/28/2017 05/11/2017  PHQ - 2 Score 0 0 0 0 0 0 0  PHQ- 9 Score - - - - - - -     Fall Risk Fall Risk  05/12/2018 04/15/2018 07/28/2017 05/11/2017 04/12/2017  Falls in the past year? 0 0 No No No  Number falls in past yr: - - - - -  Injury with Fall? - - - - -     Objective:    Today's Vitals   05/12/18 1109  05/12/18 1206  BP: (!) 145/84 130/76  Pulse: 75 61  Weight: 153 lb (69.4 kg)   Height: 5\' 6"  (1.676 m)   PainSc: 2    PainLoc: Back    Body mass index is 24.69 kg/m.  Advanced Directives 05/12/2018 05/10/2017 07/11/2015 08/15/2014 11/06/2013  Does Patient  Have a Medical Advance Directive? Yes Yes No;Yes No Patient would not like information  Type of Advance Directive [place];Living will Living will Living will - -  Does patient want to make changes to medical advance directive? No - Patient declined - No - Patient declined - -  Copy of [place] in Chart? No - copy requested - No - copy requested - -  Would patient like information on creating a medical advance directive? - - No - patient declined information Yes - Educational materials given -    Hearing/Vision  No hearing or vision deficits noted during visit.  Cognitive Function: MMSE - Mini Mental State Exam 05/12/2018 05/11/2017  Orientation to time 5 5  Orientation to Place 4 5  Registration 3 3  Attention/ Calculation 5 3  Recall 3 3  Language- name 2 objects 2 2  Language- repeat 1 1  Language- follow 3 step command 3 3  Language- read & follow direction 1 1  Write a sentence 1 1  Copy design 1 1  Total score 29 28           Immunizations and Health Maintenance Immunization History  Administered Date(s) Administered  . Influenza-Unspecified 11/26/2015, 11/21/2016, 11/17/2017  . Pneumococcal Conjugate-13 01/20/2018  . Pneumococcal Polysaccharide-23 05/07/2010  . Pneumococcal-Unspecified 06/02/2015  . Tdap 06/02/2015  . Zoster 11/26/2015  . Zoster Recombinat (Shingrix) 05/12/2018   There are no preventive care reminders to display for this patient. Health Maintenance  Topic Date Due  . MAMMOGRAM  01/05/2020  . PAP SMEAR-Modifier  01/27/2020  . PNA vac Low Risk Adult (2 of 2 - PPSV23) 06/01/2020  . TETANUS/TDAP  06/01/2025  . COLONOSCOPY  09/10/2025  . INFLUENZA VACCINE   Completed  . DEXA SCAN  Completed  . Hepatitis C Screening  Completed  . HIV Screening  Completed        Assessment:   This is a routine wellness examination for Indira.    Plan:    Goals    . Exercise 3x per week (30 min per time)     Increase walking to 3 times per week for 30 minutes        Health Maintenance & Additional Screening Recommendations  Smoking cessation counseling Advanced directives: has an advanced directive - a copy HAS NOT been provided.  Copy requested.  Lung: Low Dose CT Chest recommended if Age 85-80 years, 30 pack-year currently smoking OR have quit w/in 15years. Patient does qualify. Hepatitis C Screening recommended: completed 07/21/2015 HIV Screening recommended: 10/10/2015  Today's Orders Orders Placed This Encounter  Procedures  . Varicella-zoster vaccine IM (Shingrix)    Keep f/u with Terald Sleeper, PA-C and any other specialty appointments you may have Continue current medications Move carefully to avoid falls.  Aim for at least 150 minutes of moderate activity a week.  Read or work on puzzles daily Stay connected with friends and family  I have personally reviewed and noted the following in the patient's chart:   . Medical and social history . Use of alcohol, tobacco or illicit drugs  . Current medications and supplements . Functional ability and status . Nutritional status . Physical activity . Advanced directives . List of other physicians . Hospitalizations, surgeries, and ER visits in previous 12 months . Vitals . Screenings to include cognitive, depression, and falls . Referrals and appointments  In addition, I have reviewed and discussed with patient certain preventive protocols, quality metrics, and best  practice recommendations. A written personalized care plan for preventive services as well as general preventive health recommendations were provided to patient.     Nolberto Hanlon, RN  05/12/2018

## 2018-05-12 NOTE — Patient Instructions (Addendum)
Goals    . Exercise 3x per week (30 min per time)     Increase walking to 3 times per week for 30 minutes     At your convenience, please bring a copy of your Advance Directives ([place]) to our office to be filed in your medical record.  Please continue to move carefully to avoid falls.  You will need your 2nd Shingrix (Shingles) Vaccine after 07/11/2018.   Please follow up with Particia Nearing, PA as scheduled.  Thank you for coming in for your Annual Wellness Visit today!!  Preventive Care 40-64 Years, Female Preventive care refers to lifestyle choices and visits with your health care provider that can promote health and wellness. What does preventive care include?   A yearly physical exam. This is also called an annual well check.  Dental exams once or twice a year.  Routine eye exams. Ask your health care provider how often you should have your eyes checked.  Personal lifestyle choices, including: ? Daily care of your teeth and gums. ? Regular physical activity. ? Eating a healthy diet. ? Avoiding tobacco and drug use. ? Limiting alcohol use. ? Practicing safe sex. ? Taking low-dose aspirin daily starting at age 34. ? Taking vitamin and mineral supplements as recommended by your health care provider. What happens during an annual well check? The services and screenings done by your health care provider during your annual well check will depend on your age, overall health, lifestyle risk factors, and family history of disease. Counseling Your health care provider may ask you questions about your:  Alcohol use.  Tobacco use.  Drug use.  Emotional well-being.  Home and relationship well-being.  Sexual activity.  Eating habits.  Work and work Statistician.  Method of birth control.  Menstrual cycle.  Pregnancy history. Screening You may have the following tests or measurements:  Height, weight, and BMI.  Blood  pressure.  Lipid and cholesterol levels. These may be checked every 5 years, or more frequently if you are over 63 years old.  Skin check.  Lung cancer screening. You may have this screening every year starting at age 16 if you have a 30-pack-year history of smoking and currently smoke or have quit within the past 15 years.  Colorectal cancer screening. All adults should have this screening starting at age 30 and continuing until age 63. Your health care provider may recommend screening at age 25. You will have tests every 1-10 years, depending on your results and the type of screening test. People at increased risk should start screening at an earlier age. Screening tests may include: ? Guaiac-based fecal occult blood testing. ? Fecal immunochemical test (FIT). ? Stool DNA test. ? Virtual colonoscopy. ? Sigmoidoscopy. During this test, a flexible tube with a tiny camera (sigmoidoscope) is used to examine your rectum and lower colon. The sigmoidoscope is inserted through your anus into your rectum and lower colon. ? Colonoscopy. During this test, a long, thin, flexible tube with a tiny camera (colonoscope) is used to examine your entire colon and rectum.  Hepatitis C blood test.  Hepatitis B blood test.  Sexually transmitted disease (STD) testing.  Diabetes screening. This is done by checking your blood sugar (glucose) after you have not eaten for a while (fasting). You may have this done every 1-3 years.  Mammogram. This may be done every 1-2 years. Talk to your health care provider about when you should start having regular mammograms. This  may depend on whether you have a family history of breast cancer.  BRCA-related cancer screening. This may be done if you have a family history of breast, ovarian, tubal, or peritoneal cancers.  Pelvic exam and Pap test. This may be done every 3 years starting at age 21. Starting at age 58, this may be done every 5 years if you have a Pap test in  combination with an HPV test.  Bone density scan. This is done to screen for osteoporosis. You may have this scan if you are at high risk for osteoporosis. Discuss your test results, treatment options, and if necessary, the need for more tests with your health care provider. Vaccines Your health care provider may recommend certain vaccines, such as:  Influenza vaccine. This is recommended every year.  Tetanus, diphtheria, and acellular pertussis (Tdap, Td) vaccine. You may need a Td booster every 10 years.  Varicella vaccine. You may need this if you have not been vaccinated.  Zoster vaccine. You may need this after age 68.  Measles, mumps, and rubella (MMR) vaccine. You may need at least one dose of MMR if you were born in 1957 or later. You may also need a second dose.  Pneumococcal 13-valent conjugate (PCV13) vaccine. You may need this if you have certain conditions and were not previously vaccinated.  Pneumococcal polysaccharide (PPSV23) vaccine. You may need one or two doses if you smoke cigarettes or if you have certain conditions.  Meningococcal vaccine. You may need this if you have certain conditions.  Hepatitis A vaccine. You may need this if you have certain conditions or if you travel or work in places where you may be exposed to hepatitis A.  Hepatitis B vaccine. You may need this if you have certain conditions or if you travel or work in places where you may be exposed to hepatitis B.  Haemophilus influenzae type b (Hib) vaccine. You may need this if you have certain conditions. Talk to your health care provider about which screenings and vaccines you need and how often you need them. This information is not intended to replace advice given to you by your health care provider. Make sure you discuss any questions you have with your health care provider. Document Released: 04/19/2015 Document Revised: 05/13/2017 Document Reviewed: 01/22/2015 Elsevier Interactive Patient  Education  2019 [employer].

## 2018-06-03 ENCOUNTER — Other Ambulatory Visit: Payer: Self-pay | Admitting: Family

## 2018-06-03 DIAGNOSIS — I700 Atherosclerosis of aorta: Secondary | ICD-10-CM

## 2018-06-07 ENCOUNTER — Other Ambulatory Visit: Payer: Self-pay | Admitting: Family

## 2018-06-07 DIAGNOSIS — I700 Atherosclerosis of aorta: Secondary | ICD-10-CM

## 2018-06-07 NOTE — Telephone Encounter (Signed)
Last lipid 04/12/17

## 2018-06-09 ENCOUNTER — Other Ambulatory Visit: Payer: Self-pay | Admitting: Family

## 2018-06-14 ENCOUNTER — Other Ambulatory Visit: Payer: Self-pay | Admitting: Family

## 2018-06-14 DIAGNOSIS — F331 Major depressive disorder, recurrent, moderate: Secondary | ICD-10-CM

## 2018-06-14 DIAGNOSIS — F411 Generalized anxiety disorder: Secondary | ICD-10-CM

## 2018-06-14 NOTE — Telephone Encounter (Signed)
04/15/18 OV rtc 6 mos

## 2018-06-29 ENCOUNTER — Encounter: Payer: Self-pay | Admitting: *Deleted

## 2018-07-08 ENCOUNTER — Other Ambulatory Visit: Payer: Self-pay | Admitting: Physician Assistant

## 2018-07-08 ENCOUNTER — Telehealth: Payer: Self-pay | Admitting: Physician Assistant

## 2018-07-08 MED ORDER — LEVOTHYROXINE SODIUM 88 MCG PO TABS: ORAL_TABLET | ORAL | 0 refills | Status: DC

## 2018-07-08 NOTE — Telephone Encounter (Signed)
Pt will be out of her levothyroxine (SYNTHROID, LEVOTHROID) 88 MCG tablet next week and she knows that AJ is wanting her to come in to have her thyroid rechecked but pt is not wanting to come into the office due to [place] 19 pt wants to know AJ thoughts on it.

## 2018-07-08 NOTE — Telephone Encounter (Signed)
The levothyroxine was sent in for 90 more days.  Does plan to recheck at that.  The results have never been extremely out of range.  It is okay for [place] to wait 3 months.

## 2018-07-12 ENCOUNTER — Other Ambulatory Visit: Payer: Self-pay | Admitting: Physician Assistant

## 2018-07-18 ENCOUNTER — Telehealth: Payer: Self-pay | Admitting: Physician Assistant

## 2018-07-18 NOTE — Telephone Encounter (Signed)
Per note from [place], [place] 07/08/2018 - it is ok for patient to wait 3 months to have thyroid checked.  Patient will need labs around 10/07/2018. Left patient a voicemail to return call.

## 2018-07-20 NOTE — Telephone Encounter (Signed)
Attempted to contact patient - no call back- this encounter will be closed.

## 2018-08-13 ENCOUNTER — Other Ambulatory Visit: Payer: Self-pay | Admitting: Physician Assistant

## 2018-08-15 NOTE — Telephone Encounter (Signed)
Ronnald Ramp. Refilled. Needs repeat labs from January

## 2018-09-03 ENCOUNTER — Other Ambulatory Visit: Payer: Self-pay | Admitting: Family

## 2018-09-03 DIAGNOSIS — I700 Atherosclerosis of aorta: Secondary | ICD-10-CM

## 2018-09-10 ENCOUNTER — Other Ambulatory Visit: Payer: Self-pay | Admitting: Family

## 2018-09-10 DIAGNOSIS — F331 Major depressive disorder, recurrent, moderate: Secondary | ICD-10-CM

## 2018-09-10 DIAGNOSIS — F411 Generalized anxiety disorder: Secondary | ICD-10-CM

## 2018-09-12 NOTE — Telephone Encounter (Signed)
NTBS - last OV 7/19.

## 2018-09-18 ENCOUNTER — Other Ambulatory Visit: Payer: Self-pay | Admitting: Family

## 2018-09-18 DIAGNOSIS — J449 Chronic obstructive pulmonary disease, unspecified: Secondary | ICD-10-CM

## 2018-09-23 ENCOUNTER — Other Ambulatory Visit: Payer: Self-pay | Admitting: Family

## 2018-09-23 DIAGNOSIS — F411 Generalized anxiety disorder: Secondary | ICD-10-CM

## 2018-09-23 DIAGNOSIS — F331 Major depressive disorder, recurrent, moderate: Secondary | ICD-10-CM

## 2018-09-23 NOTE — Telephone Encounter (Signed)
NOV 10/14/18

## 2018-10-07 ENCOUNTER — Other Ambulatory Visit: Payer: Self-pay | Admitting: Physician Assistant

## 2018-10-07 ENCOUNTER — Other Ambulatory Visit: Payer: Self-pay | Admitting: Family

## 2018-10-07 DIAGNOSIS — F331 Major depressive disorder, recurrent, moderate: Secondary | ICD-10-CM

## 2018-10-07 DIAGNOSIS — F411 Generalized anxiety disorder: Secondary | ICD-10-CM

## 2018-10-13 ENCOUNTER — Other Ambulatory Visit: Payer: Self-pay

## 2018-10-14 ENCOUNTER — Ambulatory Visit (INDEPENDENT_AMBULATORY_CARE_PROVIDER_SITE_OTHER): Payer: Medicare Other | Admitting: Physician Assistant

## 2018-10-14 ENCOUNTER — Encounter: Payer: Self-pay | Admitting: Physician Assistant

## 2018-10-14 VITALS — BP 129/78 | HR 72 | Temp 98.5°F | Ht 66.0 in | Wt 158.6 lb

## 2018-10-14 DIAGNOSIS — E039 Hypothyroidism, unspecified: Secondary | ICD-10-CM | POA: Diagnosis not present

## 2018-10-14 DIAGNOSIS — I700 Atherosclerosis of aorta: Secondary | ICD-10-CM

## 2018-10-14 DIAGNOSIS — F331 Major depressive disorder, recurrent, moderate: Secondary | ICD-10-CM

## 2018-10-14 DIAGNOSIS — F411 Generalized anxiety disorder: Secondary | ICD-10-CM | POA: Diagnosis not present

## 2018-10-14 DIAGNOSIS — Z0000 Encounter for general adult medical examination without abnormal findings: Secondary | ICD-10-CM

## 2018-10-14 DIAGNOSIS — J449 Chronic obstructive pulmonary disease, unspecified: Secondary | ICD-10-CM | POA: Diagnosis not present

## 2018-10-14 MED ORDER — LEVOTHYROXINE SODIUM 100 MCG PO TABS: 100.0000 ug | ORAL_TABLET | Freq: Every day | ORAL | 3 refills | Status: DC

## 2018-10-14 MED ORDER — FLUTICASONE PROPIONATE 50 MCG/ACT NA SUSP: NASAL | 11 refills | Status: DC

## 2018-10-14 MED ORDER — BACLOFEN 10 MG PO TABS: 10.0000 mg | ORAL_TABLET | Freq: Three times a day (TID) | ORAL | 2 refills | Status: DC

## 2018-10-14 MED ORDER — ROPINIROLE HCL 3 MG PO TABS: 3.0000 mg | ORAL_TABLET | Freq: Every day | ORAL | 3 refills | Status: DC

## 2018-10-14 MED ORDER — ATORVASTATIN CALCIUM 10 MG PO TABS: 10.0000 mg | ORAL_TABLET | Freq: Every day | ORAL | 3 refills | Status: DC

## 2018-10-14 MED ORDER — CITALOPRAM HYDROBROMIDE 40 MG PO TABS: 40.0000 mg | ORAL_TABLET | Freq: Every day | ORAL | 3 refills | Status: DC

## 2018-10-14 MED ORDER — CLONAZEPAM 0.5 MG PO TABS: 0.5000 mg | ORAL_TABLET | Freq: Every day | ORAL | 1 refills | Status: DC

## 2018-10-14 MED ORDER — BREO ELLIPTA 100-25 MCG/INH IN AEPB: INHALATION_SPRAY | RESPIRATORY_TRACT | 11 refills | Status: DC

## 2018-10-15 LAB — CMP14+EGFR
ALT: 17 IU/L (ref 0–32)
AST: 24 IU/L (ref 0–40)
Albumin/Globulin Ratio: 2.1 (ref 1.2–2.2)
Albumin: 4.6 g/dL (ref 3.8–4.8)
Alkaline Phosphatase: 60 IU/L (ref 39–117)
BUN/Creatinine Ratio: 14 (ref 12–28)
BUN: 11 mg/dL (ref 8–27)
Bilirubin Total: 0.5 mg/dL (ref 0.0–1.2)
CO2: 21 mmol/L (ref 20–29)
Calcium: 9.4 mg/dL (ref 8.7–10.3)
Chloride: 93 mmol/L — ABNORMAL LOW (ref 96–106)
Creatinine, Ser: 0.81 mg/dL (ref 0.57–1.00)
GFR calc Af Amer: 88 mL/min/{1.73_m2} (ref >59)
GFR calc non Af Amer: 76 mL/min/{1.73_m2} (ref >59)
Globulin, Total: 2.2 g/dL (ref 1.5–4.5)
Glucose: 87 mg/dL (ref 65–99)
Potassium: 4.7 mmol/L (ref 3.5–5.2)
Sodium: 131 mmol/L — ABNORMAL LOW (ref 134–144)
Total Protein: 6.8 g/dL (ref 6.0–8.5)

## 2018-10-15 LAB — CBC WITH DIFFERENTIAL/PLATELET
Basophils Absolute: 0.1 10*3/uL (ref 0.0–0.2)
Basos: 1 %
EOS (ABSOLUTE): 0 10*3/uL (ref 0.0–0.4)
Eos: 1 %
Hematocrit: 43 % (ref 34.0–46.6)
Hemoglobin: 14.7 g/dL (ref 11.1–15.9)
Immature Grans (Abs): 0 10*3/uL (ref 0.0–0.1)
Immature Granulocytes: 0 %
Lymphocytes Absolute: 1.4 10*3/uL (ref 0.7–3.1)
Lymphs: 23 %
MCH: 30.5 pg (ref 26.6–33.0)
MCHC: 34.2 g/dL (ref 31.5–35.7)
MCV: 89 fL (ref 79–97)
Monocytes Absolute: 0.5 10*3/uL (ref 0.1–0.9)
Monocytes: 8 %
Neutrophils Absolute: 4.2 10*3/uL (ref 1.4–7.0)
Neutrophils: 67 %
Platelets: 212 10*3/uL (ref 150–450)
RBC: 4.82 x10E6/uL (ref 3.77–5.28)
RDW: 12.8 % (ref 11.7–15.4)
WBC: 6.2 10*3/uL (ref 3.4–10.8)

## 2018-10-15 LAB — LIPID PANEL
Chol/HDL Ratio: 1.7 ratio (ref 0.0–4.4)
Cholesterol, Total: 152 mg/dL (ref 100–199)
HDL: 88 mg/dL (ref >39)
LDL Calculated: 55 mg/dL (ref 0–99)
Triglycerides: 44 mg/dL (ref 0–149)
VLDL Cholesterol Cal: 9 mg/dL (ref 5–40)

## 2018-10-15 LAB — TSH: TSH: 4.6 u[IU]/mL — ABNORMAL HIGH (ref 0.450–4.500)

## 2018-10-17 NOTE — Progress Notes (Signed)
BP 129/78   Pulse 72   Temp 98.5 F (36.9 C) (Oral)   Ht '5\' 6"'  (1.676 m)   Wt 158 lb 9.6 oz (71.9 kg)   BMI 25.60 kg/m    Subjective:    Patient ID: Carolyn [name], female    DOB: 09/10/1950, 68 y.o.   MRN: [id number]  HPI: Carolyn [name] is a 68 y.o. female presenting on 10/14/2018 for Hypothyroidism and Hyperlipidemia  This patient comes in to be seen for her chronic medical conditions that do include hypothyroidism, hyperlipidemia, allergic rhinitis, COPD, history of skin cancer, depression, anxiety,.  She will be transferring to an office to her home.  She is a little way to come to [place].  So working to go ahead and refill her medications for the next 6 months.  After our visit and she will have labs.  We will let her know how they are.  She states that overall she has been doing very well.  Past Medical History:  Diagnosis Date  . BCC (basal cell carcinoma) 08/05/2015   left neck  . BCC (basal cell carcinoma) infilt 02/20/2016   right ala nasal  . BCC (basal cell carcinoma) sup&nod 06/03/2016   left lower sternum  . BCC (basal cell carcinoma) ulcerated 03/19/2016   mid chest between breasts  . Cervical cancer ([place])   . Depression   . Melanoma ([place])   . 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   left upper shin  . SCC (squamous cell carcinoma) in situ 03/19/2016   right pinky  . 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 SCC (squamous cell carcinoma) well diff 12/13/2008   left hand  . SCC (squamous cell carcinoma) well diff x2 12/25/2015   left and right forearm  . Thyroid disease    Relevant past medical, surgical, family and social history reviewed and updated as indicated. Interim medical history since our last  visit reviewed. Allergies and medications reviewed and updated. DATA REVIEWED: CHART IN EPIC  Family History reviewed for pertinent findings.  Review of Systems  Constitutional: Negative.   HENT: Negative.   Eyes: Negative.   Respiratory: Negative.   Gastrointestinal: Negative.   Genitourinary: Negative.     Allergies as of 10/14/2018      Reactions   Azithromycin Rash      Medication List       Accurate as of October 14, 2018 11:59 PM. If you have any questions, ask your nurse or doctor.        aspirin 81 MG EC tablet TAKE 1 TABLET BY MOUTH EVERY DAY   atorvastatin 10 MG tablet Commonly known as: LIPITOR Take 1 tablet (10 mg total) by mouth daily.   baclofen 10 MG tablet Commonly known as: LIORESAL Take 1 tablet (10 mg total) by mouth 3 (three) times daily.   Breo Ellipta 100-25 MCG/INH Aepb Generic drug: fluticasone furoate-vilanterol INHALE 1 PUFF BY MOUTH EVERY DAY   CALCIUM 600 PO Take by mouth.   CENTRUM SILVER ADULT 50+ PO Take 1 tablet by mouth daily.   citalopram 40 MG tablet Commonly known as: CELEXA Take 1 tablet (40 mg total) by mouth daily.   clonazePAM 0.5 MG tablet  Commonly known as: KLONOPIN Take 1 tablet (0.5 mg total) by mouth at bedtime.   Fish Oil 1000 MG Caps Take by mouth.   fluticasone 50 MCG/ACT nasal spray Commonly known as: FLONASE SPRAY 1 SPRAY INTO BOTH NOSTRILS DAILY   Garlic 0488 MG Caps Take by mouth.   levothyroxine 100 MCG tablet Commonly known as: SYNTHROID Take 1 tablet (100 mcg total) by mouth daily before breakfast. What changed: See the new instructions. Changed by: Terald Sleeper, PA-C   rOPINIRole 3 MG tablet Commonly known as: REQUIP Take 1 tablet (3 mg total) by mouth at bedtime.   VITAMIN B 12 PO Take 1,000 mcg by mouth daily.          Objective:    BP 129/78   Pulse 72   Temp 98.5 F (36.9 C) (Oral)   Ht '5\' 6"'  (1.676 m)   Wt 158 lb 9.6 oz (71.9 kg)   BMI 25.60 kg/m   Allergies   Allergen Reactions  . Azithromycin Rash    Wt Readings from Last 3 Encounters:  10/14/18 158 lb 9.6 oz (71.9 kg)  05/12/18 153 lb (69.4 kg)  04/15/18 148 lb 6.4 oz (67.3 kg)    Physical Exam Constitutional:      Appearance: She is well-developed.  HENT:     Head: Normocephalic and atraumatic.  Eyes:     Conjunctiva/sclera: Conjunctivae normal.     Pupils: Pupils are equal, round, and reactive to light.  Cardiovascular:     Rate and Rhythm: Normal rate and regular rhythm.     Heart sounds: Normal heart sounds.  Pulmonary:     Effort: Pulmonary effort is normal.     Breath sounds: Normal breath sounds.  Abdominal:     General: Bowel sounds are normal.     Palpations: Abdomen is soft.  Skin:    General: Skin is warm and dry.     Findings: No rash.  Neurological:     Mental Status: She is alert and oriented to person, place, and time.     Deep Tendon Reflexes: Reflexes are normal and symmetric.  Psychiatric:        Behavior: Behavior normal.        Thought Content: Thought content normal.        Judgment: Judgment normal.     Results for orders placed or performed in visit on 10/14/18  CBC with Differential/Platelet  Result Value Ref Range   WBC 6.2 3.4 - 10.8 x10E3/uL   RBC 4.82 3.77 - 5.28 x10E6/uL   Hemoglobin 14.7 11.1 - 15.9 g/dL   Hematocrit 43.0 34.0 - 46.6 %   MCV 89 79 - 97 fL   MCH 30.5 26.6 - 33.0 pg   MCHC 34.2 31.5 - 35.7 g/dL   RDW 12.8 11.7 - 15.4 %   Platelets 212 150 - 450 x10E3/uL   Neutrophils 67 Not Estab. %   Lymphs 23 Not Estab. %   Monocytes 8 Not Estab. %   Eos 1 Not Estab. %   Basos 1 Not Estab. %   Neutrophils Absolute 4.2 1.4 - 7.0 x10E3/uL   Lymphocytes Absolute 1.4 0.7 - 3.1 x10E3/uL   Monocytes Absolute 0.5 0.1 - 0.9 x10E3/uL   EOS (ABSOLUTE) 0.0 0.0 - 0.4 x10E3/uL   Basophils Absolute 0.1 0.0 - 0.2 x10E3/uL   Immature Granulocytes 0 Not Estab. %   Immature Grans (Abs) 0.0 0.0 - 0.1 x10E3/uL  CMP14+EGFR  Result Value Ref Range  Glucose 87 65 - 99 mg/dL   BUN 11 8 - 27 mg/dL   Creatinine, Ser 0.81 0.57 - 1.00 mg/dL   GFR calc non Af Amer 76 >59 mL/min/1.73   GFR calc Af Amer 88 >59 mL/min/1.73   BUN/Creatinine Ratio 14 12 - 28   Sodium 131 (L) 134 - 144 mmol/L   Potassium 4.7 3.5 - 5.2 mmol/L   Chloride 93 (L) 96 - 106 mmol/L   CO2 21 20 - 29 mmol/L   Calcium 9.4 8.7 - 10.3 mg/dL   Total Protein 6.8 6.0 - 8.5 g/dL   Albumin 4.6 3.8 - 4.8 g/dL   Globulin, Total 2.2 1.5 - 4.5 g/dL   Albumin/Globulin Ratio 2.1 1.2 - 2.2   Bilirubin Total 0.5 0.0 - 1.2 mg/dL   Alkaline Phosphatase 60 39 - 117 IU/L   AST 24 0 - 40 IU/L   ALT 17 0 - 32 IU/L  Lipid panel  Result Value Ref Range   Cholesterol, Total 152 100 - 199 mg/dL   Triglycerides 44 0 - 149 mg/dL   HDL 88 >39 mg/dL   VLDL Cholesterol Cal 9 5 - 40 mg/dL   LDL Calculated 55 0 - 99 mg/dL   Chol/HDL Ratio 1.7 0.0 - 4.4 ratio  TSH  Result Value Ref Range   TSH 4.600 (H) 0.450 - 4.500 uIU/mL      Assessment & Plan:   1. Aortic atherosclerosis (HCC) - atorvastatin (LIPITOR) 10 MG tablet; Take 1 tablet (10 mg total) by mouth daily.  Dispense: 90 tablet; Refill: 3 - CBC with Differential/Platelet - CMP14+EGFR - Lipid panel - TSH  2. Chronic obstructive pulmonary disease, unspecified COPD type (HCC) - fluticasone furoate-vilanterol (BREO ELLIPTA) 100-25 MCG/INH AEPB; INHALE 1 PUFF BY MOUTH EVERY DAY  Dispense: 60 each; Refill: 11 - CBC with Differential/Platelet  3. Moderate episode of recurrent major depressive disorder (HCC) - citalopram (CELEXA) 40 MG tablet; Take 1 tablet (40 mg total) by mouth daily.  Dispense: 90 tablet; Refill: 3 - clonazePAM (KLONOPIN) 0.5 MG tablet; Take 1 tablet (0.5 mg total) by mouth at bedtime.  Dispense: 90 tablet; Refill: 1  4. GAD (generalized anxiety disorder) - citalopram (CELEXA) 40 MG tablet; Take 1 tablet (40 mg total) by mouth daily.  Dispense: 90 tablet; Refill: 3 - clonazePAM (KLONOPIN) 0.5 MG tablet; Take 1  tablet (0.5 mg total) by mouth at bedtime.  Dispense: 90 tablet; Refill: 1  5. Hypothyroidism, unspecified type - levothyroxine (SYNTHROID) 100 MCG tablet; Take 1 tablet (100 mcg total) by mouth daily before breakfast.  Dispense: 90 tablet; Refill: 3 - CBC with Differential/Platelet - Lipid panel  6. Well adult exam - CBC with Differential/Platelet - CMP14+EGFR - Lipid panel - TSH  7. Acquired hypothyroidism - CBC with Differential/Platelet - TSH   Continue all other maintenance medications as listed above.  Follow up plan: Recheck in 6 months  Educational handout given for [place] PA-C Kimberly [address]  [place], [place] [address] [phone]   10/17/2018, 8:45 PM

## 2018-11-11 ENCOUNTER — Other Ambulatory Visit: Payer: Self-pay | Admitting: Physician Assistant

## 2018-11-11 DIAGNOSIS — I700 Atherosclerosis of aorta: Secondary | ICD-10-CM

## 2018-11-16 DIAGNOSIS — F419 Anxiety disorder, unspecified: Secondary | ICD-10-CM | POA: Diagnosis not present

## 2018-11-16 DIAGNOSIS — J449 Chronic obstructive pulmonary disease, unspecified: Secondary | ICD-10-CM | POA: Diagnosis not present

## 2018-11-16 DIAGNOSIS — G2581 Restless legs syndrome: Secondary | ICD-10-CM | POA: Diagnosis not present

## 2018-11-16 DIAGNOSIS — E782 Mixed hyperlipidemia: Secondary | ICD-10-CM | POA: Diagnosis not present

## 2018-11-16 DIAGNOSIS — F5101 Primary insomnia: Secondary | ICD-10-CM | POA: Diagnosis not present

## 2018-11-16 DIAGNOSIS — E039 Hypothyroidism, unspecified: Secondary | ICD-10-CM | POA: Diagnosis not present

## 2018-12-14 DIAGNOSIS — Z23 Encounter for immunization: Secondary | ICD-10-CM | POA: Diagnosis not present

## 2019-01-16 DIAGNOSIS — M85851 Other specified disorders of bone density and structure, right thigh: Secondary | ICD-10-CM | POA: Diagnosis not present

## 2019-01-16 DIAGNOSIS — M8588 Other specified disorders of bone density and structure, other site: Secondary | ICD-10-CM | POA: Diagnosis not present

## 2019-01-16 DIAGNOSIS — N959 Unspecified menopausal and perimenopausal disorder: Secondary | ICD-10-CM | POA: Diagnosis not present

## 2019-01-16 DIAGNOSIS — Z13820 Encounter for screening for osteoporosis: Secondary | ICD-10-CM | POA: Diagnosis not present

## 2019-01-16 DIAGNOSIS — Z1231 Encounter for screening mammogram for malignant neoplasm of breast: Secondary | ICD-10-CM | POA: Diagnosis not present

## 2019-01-31 DIAGNOSIS — R87610 Atypical squamous cells of undetermined significance on cytologic smear of cervix (ASC-US): Secondary | ICD-10-CM | POA: Diagnosis not present

## 2019-01-31 DIAGNOSIS — Z124 Encounter for screening for malignant neoplasm of cervix: Secondary | ICD-10-CM | POA: Diagnosis not present

## 2019-01-31 DIAGNOSIS — Z01419 Encounter for gynecological examination (general) (routine) without abnormal findings: Secondary | ICD-10-CM | POA: Diagnosis not present

## 2019-01-31 DIAGNOSIS — Z8742 Personal history of other diseases of the female genital tract: Secondary | ICD-10-CM | POA: Diagnosis not present

## 2019-02-14 DIAGNOSIS — L570 Actinic keratosis: Secondary | ICD-10-CM | POA: Diagnosis not present

## 2019-02-14 DIAGNOSIS — Z85820 Personal history of malignant melanoma of skin: Secondary | ICD-10-CM | POA: Diagnosis not present

## 2019-02-14 DIAGNOSIS — C44519 Basal cell carcinoma of skin of other part of trunk: Secondary | ICD-10-CM | POA: Diagnosis not present

## 2019-02-14 DIAGNOSIS — D2362 Other benign neoplasm of skin of left upper limb, including shoulder: Secondary | ICD-10-CM | POA: Diagnosis not present

## 2019-02-14 DIAGNOSIS — C44319 Basal cell carcinoma of skin of other parts of face: Secondary | ICD-10-CM | POA: Diagnosis not present

## 2019-02-14 DIAGNOSIS — D485 Neoplasm of uncertain behavior of skin: Secondary | ICD-10-CM | POA: Diagnosis not present

## 2019-02-14 DIAGNOSIS — Z08 Encounter for follow-up examination after completed treatment for malignant neoplasm: Secondary | ICD-10-CM | POA: Diagnosis not present

## 2019-02-14 DIAGNOSIS — L820 Inflamed seborrheic keratosis: Secondary | ICD-10-CM | POA: Diagnosis not present

## 2019-10-27 ENCOUNTER — Other Ambulatory Visit: Payer: Self-pay | Admitting: *Deleted

## 2019-10-27 DIAGNOSIS — J449 Chronic obstructive pulmonary disease, unspecified: Secondary | ICD-10-CM

## 2019-10-27 NOTE — Telephone Encounter (Signed)
[name]. NTBS LOV 10/14/18

## 2019-10-30 NOTE — Telephone Encounter (Signed)
This pt is no longer our pt. She is seeing PCP at [facility].

## 2019-10-31 ENCOUNTER — Telehealth: Payer: Self-pay

## 2019-10-31 DIAGNOSIS — J449 Chronic obstructive pulmonary disease, unspecified: Secondary | ICD-10-CM

## 2019-10-31 MED ORDER — BREO ELLIPTA 100-25 MCG/INH IN AEPB: INHALATION_SPRAY | RESPIRATORY_TRACT | 0 refills | Status: DC

## 2019-10-31 NOTE — Telephone Encounter (Signed)
Refill

## 2021-01-17 LAB — HM MAMMOGRAPHY

## 2021-02-04 LAB — HM PAP SMEAR: HM Pap smear: NEGATIVE

## 2021-02-04 LAB — RESULTS CONSOLE HPV: CHL HPV: POSITIVE

## 2021-04-08 ENCOUNTER — Other Ambulatory Visit: Payer: Self-pay

## 2021-04-08 ENCOUNTER — Encounter: Payer: Self-pay | Admitting: Family Medicine

## 2021-04-08 ENCOUNTER — Ambulatory Visit (INDEPENDENT_AMBULATORY_CARE_PROVIDER_SITE_OTHER): Payer: No Typology Code available for payment source | Admitting: Family Medicine

## 2021-04-08 VITALS — BP 127/72 | HR 73 | Temp 97.3°F | Ht 66.0 in | Wt 159.0 lb

## 2021-04-08 DIAGNOSIS — E039 Hypothyroidism, unspecified: Secondary | ICD-10-CM

## 2021-04-08 DIAGNOSIS — I700 Atherosclerosis of aorta: Secondary | ICD-10-CM

## 2021-04-08 DIAGNOSIS — E785 Hyperlipidemia, unspecified: Secondary | ICD-10-CM | POA: Diagnosis not present

## 2021-04-08 DIAGNOSIS — R87612 Low grade squamous intraepithelial lesion on cytologic smear of cervix (LGSIL): Secondary | ICD-10-CM

## 2021-04-08 DIAGNOSIS — G2581 Restless legs syndrome: Secondary | ICD-10-CM | POA: Diagnosis not present

## 2021-04-08 DIAGNOSIS — Z87410 Personal history of cervical dysplasia: Secondary | ICD-10-CM

## 2021-04-08 DIAGNOSIS — F331 Major depressive disorder, recurrent, moderate: Secondary | ICD-10-CM

## 2021-04-08 DIAGNOSIS — J449 Chronic obstructive pulmonary disease, unspecified: Secondary | ICD-10-CM

## 2021-04-08 DIAGNOSIS — F411 Generalized anxiety disorder: Secondary | ICD-10-CM | POA: Diagnosis not present

## 2021-04-08 DIAGNOSIS — G4700 Insomnia, unspecified: Secondary | ICD-10-CM

## 2021-04-08 MED ORDER — ADVAIR DISKUS 250-50 MCG/ACT IN AEPB: 1.0000 | INHALATION_SPRAY | Freq: Two times a day (BID) | RESPIRATORY_TRACT | 3 refills | Status: DC

## 2021-04-08 MED ORDER — BACLOFEN 10 MG PO TABS: 10.0000 mg | ORAL_TABLET | Freq: Three times a day (TID) | ORAL | 2 refills | Status: DC

## 2021-04-08 MED ORDER — ROPINIROLE HCL 3 MG PO TABS: 3.0000 mg | ORAL_TABLET | Freq: Every day | ORAL | 3 refills | Status: DC

## 2021-04-08 MED ORDER — CITALOPRAM HYDROBROMIDE 40 MG PO TABS: 40.0000 mg | ORAL_TABLET | Freq: Every day | ORAL | 3 refills | Status: DC

## 2021-04-08 MED ORDER — ATORVASTATIN CALCIUM 10 MG PO TABS: 10.0000 mg | ORAL_TABLET | Freq: Every day | ORAL | 3 refills | Status: DC

## 2021-04-08 MED ORDER — TRAZODONE HCL 50 MG PO TABS
50.0000 mg | ORAL_TABLET | Freq: Every evening | ORAL | 2 refills | Status: DC | PRN
Start: 2021-04-08 — End: 2021-08-20

## 2021-04-08 MED ORDER — LEVOTHYROXINE SODIUM 100 MCG PO TABS: 100.0000 ug | ORAL_TABLET | Freq: Every day | ORAL | 3 refills | Status: DC

## 2021-04-08 NOTE — Assessment & Plan Note (Signed)
Weaning from clonazepam and will start trazodone for chronic insomnia.

## 2021-04-08 NOTE — Assessment & Plan Note (Signed)
Referral placed to GYN 

## 2021-04-08 NOTE — Patient Instructions (Addendum)
Taper clonazepam over the next couple of weeks: Decrease to every other night for 1 week then decrease to 1/2 tablet every other night for 2 weeks.  Go ahead and start trazodone for sleep. Let's see if Advair is cheaper then Breo.  See me again in about 3 months.

## 2021-04-08 NOTE — Assessment & Plan Note (Signed)
This remains well controlled with current dose of citalopram.  Continue current strength.

## 2021-04-08 NOTE — Assessment & Plan Note (Addendum)
Changing Breo to Advair as this appears to be on her formulary.  Counseled on smoking cessation

## 2021-04-08 NOTE — Progress Notes (Signed)
Carolyn [name] - 71 y.o. female MRN [id number]  Date of birth: 15-Aug-1950  Subjective Chief Complaint  Patient presents with   T[place] is a 71 year old female here today for initial visit to establish care.  She has history of hypothyroidism, anxiety with insomnia, RLS and hyperlipidemia.  She is also concerned about previous abnormal Paps and would like referral to GYN.  Circulators  Insomnia is currently managed with clonazepam nightly however she would like to an alternative to this.  She did try over-the-counter sleep medication however this was not effective.  Clonazepam was only thing she has tried that she recalls.  She has taken citalopram for management of anxiety and feels like this is working well.  She is taking Requip for RLS which is effective.  She has prescribed Breo for treatment of COPD.  Symptoms are well controlled however cost is a problem for her.  She is interested in trying something different.  She does continue to smoke.  Tolerating atorvastatin well for management of hyperlipidemia.  Due for updated labs.  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   left nare   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   left upper shin   SCC (squamous cell carcinoma) in situ 03/19/2016   right pinky   SCC (squamous  cell carcinoma) in situ x 2 07/05/2014   right thigh, left upper shin   SCC (squamous cell carcinoma) well diff 12/13/2008   left hand   SCC (squamous cell carcinoma) well diff x2 12/25/2015   left and right forearm   Thyroid disease     Past Surgical History:  Procedure Laterality Date   CERVICAL CONIZATION W/BX     EYE SURGERY Bilateral    Cataracts   MELANOMA EXCISION  08/2014   back   SHOULDER SURGERY Right     Social History   Socioeconomic History   Marital status: Divorced    Spouse name: Not on file   Number of children: 2   Years of education: Not on file   Highest education level: GED or equivalent  Occupational History   Occupation: disabled  Tobacco Use   Smoking status: Every Day    Packs/day: 1.00    Types: Cigarettes    Start date: 04/06/1968   Smokeless tobacco: Never  Vaping Use   Vaping Use: Never used  Substance and Sexual Activity   Alcohol use: No   Drug use: No   Sexual activity: Not on file  Other Topics Concern   Not on file  Social History Narrative   Not on file   Social Determinants of Health   Financial Resource Strain: Not on file  Food Insecurity: Not on file  Transportation Needs: Not on file  Physical Activity: Not on file  Stress: Not on file  Social Connections: Not on file    Family History  Problem Relation Age of Onset   Heart disease Mother    Aneurysm Mother    Heart disease Father    Kidney failure Father    Cancer Sister    Cancer Brother    Cirrhosis Daughter    Cancer Sister        Bronchial    Health Maintenance  Topic Date Due   Pneumonia Vaccine 50+ Years old (3 - PPSV23 if available, else PCV20) 01/21/2019   MAMMOGRAM  01/05/2020   INFLUENZA VACCINE  11/04/2020   COLONOSCOPY (Pts 45-50yrs Insurance coverage will need to be confirmed)  09/10/2025   TETANUS/TDAP  01/04/2031   DEXA SCAN  Completed   COVID-19 Vaccine  Completed   Hepatitis C Screening  Completed   Zoster Vaccines- Shingrix   Completed   HPV VACCINES  Aged Out     ----------------------------------------------------------------------------------------------------------------------------------------------------------------------------------------------------------------- Physical Exam BP 127/72 (BP Location: Left Arm, Patient Position: Sitting, Cuff Size: Normal)    Pulse 73    Temp (!) 97.3 F (36.3 C)    Ht 5\' 6"  (1.676 m)    Wt 159 lb (72.1 kg)    SpO2 100%    BMI 25.66 kg/m   Physical Exam Constitutional:      Appearance: Normal appearance.  Eyes:     General: No scleral icterus. Cardiovascular:     Rate and Rhythm: Normal rate and regular rhythm.  Pulmonary:     Effort: Pulmonary effort is normal.     Breath sounds: Normal breath sounds.  Musculoskeletal:     Cervical back: Neck supple.  Neurological:     General: No focal deficit present.     Mental Status: She is alert.  Psychiatric:        Mood and Affect: Mood normal.        Behavior: Behavior normal.    ------------------------------------------------------------------------------------------------------------------------------------------------------------------------------------------------------------------- Assessment and Plan  COPD (chronic obstructive pulmonary disease) ([place]) Changing Breo to Advair as this appears to be on her formulary.  Counseled on smoking cessation  Hypothyroidism Updating TSH today.  Restless leg syndrome Continue ropinirole at current strength.  History of cervical dysplasia Referral placed to GYN.  GAD (generalized anxiety disorder) This remains well controlled with current dose of citalopram.  Continue current strength.  Insomnia Weaning from clonazepam and will start trazodone for chronic insomnia.   Meds ordered this encounter  Medications   citalopram (CELEXA) 40 MG tablet    Sig: Take 1 tablet (40 mg total) by mouth daily.    Dispense:  90 tablet    Refill:  3    DX Code Needed  .    baclofen (LIORESAL) 10 MG tablet    Sig: Take 1 tablet (10 mg total) by mouth 3 (three) times daily.    Dispense:  180 tablet    Refill:  2   atorvastatin (LIPITOR) 10 MG tablet    Sig: Take 1 tablet (10 mg total) by mouth daily.    Dispense:  90 tablet    Refill:  3   fluticasone-salmeterol (ADVAIR DISKUS) 250-50 MCG/ACT AEPB    Sig: Inhale 1 puff into the lungs in the morning and at bedtime.    Dispense:  60 each    Refill:  3   levothyroxine (SYNTHROID) 100 MCG tablet    Sig: Take 1 tablet (100 mcg total) by mouth daily before breakfast.  Dispense:  90 tablet    Refill:  3   rOPINIRole (REQUIP) 3 MG tablet    Sig: Take 1 tablet (3 mg total) by mouth at bedtime.    Dispense:  90 tablet    Refill:  3   traZODone (DESYREL) 50 MG tablet    Sig: Take 1-2 tablets (50-100 mg total) by mouth at bedtime as needed for sleep.    Dispense:  90 tablet    Refill:  2    Return in about 3 months (around 07/07/2021) for COPD/Insomnia.    This visit occurred during the SARS-CoV-2 public health emergency.  Safety protocols were in place, including screening questions prior to the visit, additional usage of staff PPE, and extensive cleaning of exam room while observing appropriate contact time as indicated for disinfecting solutions.

## 2021-04-08 NOTE — Assessment & Plan Note (Signed)
Updating TSH today. 

## 2021-04-08 NOTE — Assessment & Plan Note (Signed)
Continue ropinirole at current strength.

## 2021-04-09 ENCOUNTER — Encounter: Payer: Self-pay | Admitting: Family Medicine

## 2021-04-09 ENCOUNTER — Other Ambulatory Visit: Payer: Self-pay

## 2021-04-09 MED ORDER — FLUTICASONE PROPIONATE 50 MCG/ACT NA SUSP: NASAL | 11 refills | Status: DC

## 2021-04-09 MED ORDER — BUDESONIDE-FORMOTEROL FUMARATE 160-4.5 MCG/ACT IN AERO: 2.0000 | INHALATION_SPRAY | Freq: Two times a day (BID) | RESPIRATORY_TRACT | 3 refills | Status: DC

## 2021-04-09 MED ORDER — FLUTICASONE FUROATE-VILANTEROL 100-25 MCG/ACT IN AEPB: 1.0000 | INHALATION_SPRAY | Freq: Every day | RESPIRATORY_TRACT | 11 refills | Status: DC

## 2021-04-09 NOTE — Telephone Encounter (Signed)
Task completed. Patient advised that Adair Patter rx sent to the pharmacy. Patient informed that provider is recommending her to complete a [employer] patient assistance form for inhalers. Form placed at the front desk accordion folder. Patient will stop by some time this week to complete the form.

## 2021-04-09 NOTE — Telephone Encounter (Signed)
Informed patient that rx request was completed and sent to the pharmacy. Per patient, she is not going to get the symbicort rx due to high OOP cost ($95). Patient is requesting a rx for Breo Ellipta.

## 2021-04-09 NOTE — Telephone Encounter (Signed)
Done

## 2021-04-09 NOTE — Telephone Encounter (Signed)
Patient called stating that out of pocket cost for advair diskus was $82. Patient would like to switch to symbicort inhaler. She is also requesting a med refill for flonase nasal . Written by an external provider. Rxs pended.

## 2021-04-09 NOTE — Addendum Note (Signed)
Addended by: Perlie Mayo on: 04/09/2021 03:41 PM   Modules accepted: Orders

## 2021-04-11 LAB — CBC WITH DIFFERENTIAL/PLATELET
Absolute Monocytes: 552 cells/uL (ref 200–950)
Basophils Absolute: 64 cells/uL (ref 0–200)
Basophils Relative: 0.8 %
Eosinophils Absolute: 32 cells/uL (ref 15–500)
Eosinophils Relative: 0.4 %
HCT: 42.2 % (ref 35.0–45.0)
Hemoglobin: 14 g/dL (ref 11.7–15.5)
Lymphs Abs: 1024 cells/uL (ref 850–3900)
MCH: 29.1 pg (ref 27.0–33.0)
MCHC: 33.2 g/dL (ref 32.0–36.0)
MCV: 87.7 fL (ref 80.0–100.0)
MPV: 9.9 fL (ref 7.5–12.5)
Monocytes Relative: 6.9 %
Neutro Abs: 6328 cells/uL (ref 1500–7800)
Neutrophils Relative %: 79.1 %
Platelets: 256 10*3/uL (ref 140–400)
RBC: 4.81 10*6/uL (ref 3.80–5.10)
RDW: 12.7 % (ref 11.0–15.0)
Total Lymphocyte: 12.8 %
WBC: 8 10*3/uL (ref 3.8–10.8)

## 2021-04-11 LAB — LIPID PANEL W/REFLEX DIRECT LDL
Cholesterol: 180 mg/dL (ref <200)
HDL: 90 mg/dL (ref >=50)
LDL Cholesterol (Calc): 74 mg/dL (calc)
Non-HDL Cholesterol (Calc): 90 mg/dL (calc) (ref <130)
Total CHOL/HDL Ratio: 2 (calc) (ref <5.0)
Triglycerides: 76 mg/dL (ref <150)

## 2021-04-11 LAB — COMPLETE METABOLIC PANEL WITH GFR
AG Ratio: 1.6 (calc) (ref 1.0–2.5)
ALT: 16 U/L (ref 6–29)
AST: 20 U/L (ref 10–35)
Albumin: 4.2 g/dL (ref 3.6–5.1)
Alkaline phosphatase (APISO): 81 U/L (ref 37–153)
BUN: 11 mg/dL (ref 7–25)
CO2: 30 mmol/L (ref 20–32)
Calcium: 9.9 mg/dL (ref 8.6–10.4)
Chloride: 97 mmol/L — ABNORMAL LOW (ref 98–110)
Creat: 0.78 mg/dL (ref 0.50–1.05)
Globulin: 2.6 g/dL (calc) (ref 1.9–3.7)
Glucose, Bld: 97 mg/dL (ref 65–99)
Potassium: 4.8 mmol/L (ref 3.5–5.3)
Sodium: 134 mmol/L — ABNORMAL LOW (ref 135–146)
Total Bilirubin: 0.6 mg/dL (ref 0.2–1.2)
Total Protein: 6.8 g/dL (ref 6.1–8.1)
eGFR: 83 mL/min/{1.73_m2} (ref >=60)

## 2021-04-11 LAB — TSH: TSH: 1.76 mIU/L (ref 0.40–4.50)

## 2021-04-24 ENCOUNTER — Encounter: Payer: Self-pay | Admitting: Obstetrics & Gynecology

## 2021-04-24 ENCOUNTER — Other Ambulatory Visit: Payer: Self-pay

## 2021-04-24 ENCOUNTER — Ambulatory Visit (INDEPENDENT_AMBULATORY_CARE_PROVIDER_SITE_OTHER): Payer: No Typology Code available for payment source | Admitting: Obstetrics & Gynecology

## 2021-04-24 VITALS — BP 138/74 | HR 79 | Ht 65.0 in | Wt 160.0 lb

## 2021-04-24 DIAGNOSIS — R87810 Cervical high risk human papillomavirus (HPV) DNA test positive: Secondary | ICD-10-CM | POA: Diagnosis not present

## 2021-04-24 NOTE — Patient Instructions (Addendum)
COLPOSCOPY POST-PROCEDURE INSTRUCTIONS  You may take Ibuprofen, Aleve or Tylenol for cramping if needed.  Light bleeding is normal.  If bleeding is heavier than your period, please call.  Put nothing in your vagina until the bleeding or discharge stops (usually 2 or3 days).  You will need repeat pap smear next year.

## 2021-04-24 NOTE — Progress Notes (Signed)
° ° °  GYNECOLOGY OFFICE COLPOSCOPY PROCEDURE NOTE  71 y.o. 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 Results in [place]. Persistently noted for last three years in [facility] records, patient reports this has been going on for 8 years.  Discussed role for HPV in cervical dysplasia, need for continued surveillance.  Patient gave informed written consent, time out was performed.  Placed in lithotomy position. Cervix viewed with speculum and colposcope after application of acetic acid. Cervix noted to be very small and almost flush with vagina, very strophic. Cervical stenosis noted.  Colposcopy adequate? No, due to cervical stenosis. No TZ visualized, unable to breach endocervical canal.  No visible lesions so no biopsies obtained.  ECC specimen unable to be obtained due to stenosis.  Patient was given post procedure instructions.  Will repeat pap smear next year, may need further colposcopies if HPV continues to be persistent.  Routine preventative health maintenance measures emphasized.    Verita Schneiders, MD, [place] for [employer], [place]

## 2021-05-15 ENCOUNTER — Other Ambulatory Visit: Payer: Self-pay | Admitting: Family Medicine

## 2021-06-23 ENCOUNTER — Other Ambulatory Visit: Payer: Self-pay

## 2021-06-23 ENCOUNTER — Ambulatory Visit (INDEPENDENT_AMBULATORY_CARE_PROVIDER_SITE_OTHER): Payer: No Typology Code available for payment source | Admitting: Sports Medicine

## 2021-06-23 ENCOUNTER — Other Ambulatory Visit: Payer: Self-pay | Admitting: Family Medicine

## 2021-06-23 ENCOUNTER — Ambulatory Visit (INDEPENDENT_AMBULATORY_CARE_PROVIDER_SITE_OTHER): Payer: No Typology Code available for payment source

## 2021-06-23 DIAGNOSIS — J984 Other disorders of lung: Secondary | ICD-10-CM | POA: Diagnosis not present

## 2021-06-23 DIAGNOSIS — J449 Chronic obstructive pulmonary disease, unspecified: Secondary | ICD-10-CM | POA: Diagnosis not present

## 2021-06-23 DIAGNOSIS — H6121 Impacted cerumen, right ear: Secondary | ICD-10-CM

## 2021-06-23 DIAGNOSIS — L570 Actinic keratosis: Secondary | ICD-10-CM | POA: Diagnosis not present

## 2021-06-23 DIAGNOSIS — J929 Pleural plaque without asbestos: Secondary | ICD-10-CM | POA: Diagnosis not present

## 2021-06-23 DIAGNOSIS — C44722 Squamous cell carcinoma of skin of right lower limb, including hip: Secondary | ICD-10-CM | POA: Diagnosis not present

## 2021-06-23 DIAGNOSIS — D485 Neoplasm of uncertain behavior of skin: Secondary | ICD-10-CM | POA: Diagnosis not present

## 2021-06-23 DIAGNOSIS — I700 Atherosclerosis of aorta: Secondary | ICD-10-CM | POA: Diagnosis not present

## 2021-06-23 DIAGNOSIS — L821 Other seborrheic keratosis: Secondary | ICD-10-CM | POA: Diagnosis not present

## 2021-06-23 DIAGNOSIS — Z85820 Personal history of malignant melanoma of skin: Secondary | ICD-10-CM | POA: Diagnosis not present

## 2021-06-23 DIAGNOSIS — C44321 Squamous cell carcinoma of skin of nose: Secondary | ICD-10-CM | POA: Diagnosis not present

## 2021-06-23 DIAGNOSIS — Z1231 Encounter for screening mammogram for malignant neoplasm of breast: Secondary | ICD-10-CM

## 2021-06-23 DIAGNOSIS — R918 Other nonspecific abnormal finding of lung field: Secondary | ICD-10-CM | POA: Diagnosis not present

## 2021-06-23 MED ORDER — PREDNISONE 50 MG PO TABS: 50.0000 mg | ORAL_TABLET | Freq: Every day | ORAL | 0 refills | Status: DC

## 2021-06-23 MED ORDER — DOXYCYCLINE MONOHYDRATE 100 MG PO CAPS: 100.0000 mg | ORAL_CAPSULE | Freq: Two times a day (BID) | ORAL | 0 refills | Status: AC

## 2021-06-23 MED ORDER — IPRATROPIUM-ALBUTEROL 20-100 MCG/ACT IN AERS: 1.0000 | INHALATION_SPRAY | Freq: Four times a day (QID) | RESPIRATORY_TRACT | 5 refills | Status: DC | PRN

## 2021-06-23 NOTE — Progress Notes (Signed)
? ? ?  Procedures performed today:   ? ?Indication: Cerumen impaction of the right ear(s) ?Medical necessity statement: On physical examination, cerumen impairs clinically significant portions of the external auditory canal, and tympanic membrane. Noted obstructive, copious cerumen that cannot be removed without magnification and instrumentations requiring physician skills ?Consent: Discussed benefits and risks of procedure and verbal consent obtained ?Procedure: Patient was prepped for the procedure. Utilized an otoscope to assess and take note of the ear canal, the tympanic membrane, and the presence, amount, and placement of the cerumen. Gentle water irrigation was utilized to remove cerumen.  ?Post procedure examination: shows cerumen was completely removed. Patient tolerated procedure well. The patient is made aware that they may experience temporary vertigo, temporary hearing loss, and temporary discomfort. If these symptom last for more than 24 hours to call the clinic or proceed to the ED. ? ?Independent interpretation of notes and tests performed by another provider:  ? ?None. ? ?Brief History, Exam, Impression, and Recommendations:   ? ?COPD (chronic obstructive pulmonary disease) ([place]) ?Had about a week of increasing cough, wheezing, malaise, sore throat. ?No fevers or chills, negative COVID test. ?Currently on Breo. ?Still smokes about half pack a day. ?On exam she has diffuse expiratory wheezes left lung, right side is mostly clear. ?Adding 5 days of prednisone, doxycycline, chest x-ray. ?I also like her to get albuterol/ipratropium inhaler for rescue purposes. ?She was complaining of some right-sided neck pain that I think is secondary to all of her coughing and underlying spondylitic processes that we can work-up in more detail in the future if needed. ? ?Hearing loss due to cerumen impaction, right ?Cleared with irrigation. ? ?Chronic process with exacerbation and pharmacologic  intervention ? ?___________________________________________ ?Gwen Her. Dianah Field, M.D., ABFM., CAQSM. ?Primary Care and Sports Medicine ?[place] ? ?Adjunct Instructor of Family Medicine  ?[employer] ?

## 2021-06-23 NOTE — Assessment & Plan Note (Signed)
Cleared with irrigation. 

## 2021-06-23 NOTE — Assessment & Plan Note (Signed)
Had about a week of increasing cough, wheezing, malaise, sore throat. ?No fevers or chills, negative COVID test. ?Currently on Breo. ?Still smokes about half pack a day. ?On exam she has diffuse expiratory wheezes left lung, right side is mostly clear. ?Adding 5 days of prednisone, doxycycline, chest x-ray. ?I also like her to get albuterol/ipratropium inhaler for rescue purposes. ?She was complaining of some right-sided neck pain that I think is secondary to all of her coughing and underlying spondylitic processes that we can work-up in more detail in the future if needed. ?

## 2021-06-29 ENCOUNTER — Other Ambulatory Visit: Payer: Self-pay | Admitting: Family Medicine

## 2021-07-15 ENCOUNTER — Encounter: Payer: Self-pay | Admitting: Family Medicine

## 2021-07-15 ENCOUNTER — Ambulatory Visit (INDEPENDENT_AMBULATORY_CARE_PROVIDER_SITE_OTHER): Payer: No Typology Code available for payment source | Admitting: Family Medicine

## 2021-07-15 VITALS — BP 149/69 | HR 71 | Ht 65.0 in | Wt 166.0 lb

## 2021-07-15 DIAGNOSIS — Z13820 Encounter for screening for osteoporosis: Secondary | ICD-10-CM

## 2021-07-15 DIAGNOSIS — G2581 Restless legs syndrome: Secondary | ICD-10-CM | POA: Diagnosis not present

## 2021-07-15 DIAGNOSIS — Z780 Asymptomatic menopausal state: Secondary | ICD-10-CM

## 2021-07-15 DIAGNOSIS — J449 Chronic obstructive pulmonary disease, unspecified: Secondary | ICD-10-CM

## 2021-07-15 MED ORDER — ROPINIROLE HCL 3 MG PO TABS: ORAL_TABLET | ORAL | 1 refills | Status: DC

## 2021-07-15 NOTE — Assessment & Plan Note (Signed)
Updated DEXA ordered.  ?

## 2021-07-15 NOTE — Assessment & Plan Note (Signed)
She is having increased symptoms during the day.  Change requip to 1/2 in them morning and afternoon with full tab at bedtime.  F/u in 3-4 months.  ?

## 2021-07-15 NOTE — Assessment & Plan Note (Signed)
Continue use of breo daily with albuterol as needed.  Strongly encouraged smoking cessation.  ?

## 2021-07-15 NOTE — Progress Notes (Signed)
?Luisana Lutzke - 71 y.o. female MRN [id number]  Date of birth: Mar 23, 1951 ? ?Subjective ?Chief Complaint  ?Patient presents with  ? COPD  ? Tremors  ? ? ?HPI ?Lynzy Jardin is a 71 y.o. female here today for follow up of COPD and RLS.  She reports that respiratory symptoms are doing well.  She is using breo daily as directed with albuterol as needed.  She does continue to smoke.  Does not want to quit at this time.  ? ?Continues to have difficulty with RLS symptoms.  She was taking requip '3mg'$  BID.  Previous rx sent as once per day so she is out of medication.  This does work fairly well but she has some symptoms during the day as well.  She has taken an additional tab in the afternoon if she felt like she needed to.  She does notice tremor at times but this is not constant.   ? ?ROS:  A comprehensive ROS was completed and negative except as noted per HPI ? ?Allergies  ?Allergen Reactions  ? Azithromycin Rash  ? ? ?Past Medical History:  ?Diagnosis Date  ? BCC (basal cell carcinoma) 08/05/2015  ? left neck  ? BCC (basal cell carcinoma) infilt 02/20/2016  ? right ala nasal  ? BCC (basal cell carcinoma) sup&nod 06/03/2016  ? left lower sternum  ? BCC (basal cell carcinoma) ulcerated 03/19/2016  ? mid chest between breasts  ? Cervical cancer ([place])   ? Depression   ? Melanoma ([place])   ? 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[place]) 07/05/2014  ? right shoulder blade  ? 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 left upper arm  ? 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 left nare  ? 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  ? left upper shin  ? SCC (squamous cell carcinoma) in situ 03/19/2016  ? right pinky  ? 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 right thigh, left upper shin  ? SCC (squamous cell carcinoma) well diff 12/13/2008  ? left hand  ? SCC (squamous cell carcinoma) well diff x2 12/25/2015  ? left and right forearm  ? Thyroid disease   ? ? ?Past Surgical History:  ?Procedure Laterality  Date  ? CERVICAL CONIZATION W/BX    ? EYE SURGERY Bilateral   ? Cataracts  ? MELANOMA EXCISION  08/2014  ? back  ? SHOULDER SURGERY Right   ? ? ?Social History  ? ?Socioeconomic History  ? Marital status: Divorced  ?  Spouse name: Not on file  ? Number of children: 2  ? Years of education: Not on file  ? Highest education level: GED or equivalent  ?Occupational History  ? Occupation: disabled  ?Tobacco Use  ? Smoking status: Every Day  ?  Packs/day: 0.50  ?  Types: Cigarettes  ?  Start date: 04/06/1968  ?  Passive exposure: Never  ? Smokeless tobacco: Never  ?Vaping Use  ? Vaping Use: Never used  ?Substance and Sexual Activity  ? Alcohol use: Not Currently  ? Drug use: No  ? Sexual activity: Not on file  ?Other Topics Concern  ? Not on file  ?Social History Narrative  ? Not on file  ? ?Social Determinants of Health  ? ?Financial Resource Strain: Not on file  ?Food Insecurity: Not on file  ?Transportation Needs: Not on file  ?Physical Activity: Not on file  ?Stress: Not on file  ?Social Connections: Not on file  ? ? ?  Family History  ?Problem Relation Age of Onset  ? Heart disease Mother   ? Aneurysm Mother   ? Heart disease Father   ? Kidney failure Father   ? Cancer Sister   ? Cancer Brother   ? Cirrhosis Daughter   ? Cancer Sister   ?     Bronchial  ? ? ?Health Maintenance  ?Topic Date Due  ? Pneumonia Vaccine 8+ Years old (3 - PPSV23 if available, else PCV20) 07/16/2022 (Originally 01/21/2019)  ? INFLUENZA VACCINE  11/04/2021  ? MAMMOGRAM  01/18/2023  ? COLONOSCOPY (Pts 45-2yr Insurance coverage will need to be confirmed)  09/10/2025  ? TETANUS/TDAP  01/04/2031  ? DEXA SCAN  Completed  ? COVID-19 Vaccine  Completed  ? Hepatitis C Screening  Completed  ? Zoster Vaccines- Shingrix  Completed  ? HPV VACCINES  Aged Out   ? ? ? ?----------------------------------------------------------------------------------------------------------------------------------------------------------------------------------------------------------------- ?Physical Exam ?BP (!) 149/69 (BP Location: Left Arm, Patient Position: Sitting, Cuff Size: Large)   Pulse 71   Ht '5\' 5"'$  (1.651 m)   Wt 166 lb (75.3 kg)   SpO2 95%   BMI 27.62 kg/m?  ? ?Physical Exam ?Constitutional:   ?   Appearance: Normal appearance.  ?Cardiovascular:  ?   Rate and Rhythm: Normal rate and regular rhythm.  ?Pulmonary:  ?   Effort: Pulmonary effort is normal.  ?   Breath sounds: Normal breath sounds.  ?Neurological:  ?   Mental Status: She is alert.  ?   Comments: No tremor noted on exam today.   ? ? ?------------------------------------------------------------------------------------------------------------------------------------------------------------------------------------------------------------------- ?Assessment and Plan ? ?COPD (chronic obstructive pulmonary disease) ([place] ?Continue use of breo daily with albuterol as needed.  Strongly encouraged smoking cessation.  ? ?Restless leg syndrome ?She is having increased symptoms during the day.  Change requip to 1/2 in them morning and afternoon with full tab at bedtime.  F/u in 3-4 months.  ? ?Postmenopausal state ?Updated DEXA ordered.  ? ? ?Meds ordered this encounter  ?Medications  ? rOPINIRole (REQUIP) 3 MG tablet  ?  Sig: Take 1/2 tab in the morning, 1/2 tab in the afternoon and full tab at bedtime.  ?  Dispense:  180 tablet  ?  Refill:  1  ? ? ?No follow-ups on file. ? ? ? ?This visit occurred during the SARS-CoV-2 public health emergency.  Safety protocols were in place, including screening questions prior to the visit, additional usage of staff PPE, and extensive cleaning of exam room while observing appropriate contact time as indicated for disinfecting solutions.  ? ?

## 2021-07-23 ENCOUNTER — Ambulatory Visit (INDEPENDENT_AMBULATORY_CARE_PROVIDER_SITE_OTHER): Payer: No Typology Code available for payment source

## 2021-07-23 DIAGNOSIS — Z13820 Encounter for screening for osteoporosis: Secondary | ICD-10-CM

## 2021-07-23 DIAGNOSIS — Z780 Asymptomatic menopausal state: Secondary | ICD-10-CM

## 2021-07-23 DIAGNOSIS — M8589 Other specified disorders of bone density and structure, multiple sites: Secondary | ICD-10-CM | POA: Diagnosis not present

## 2021-07-28 ENCOUNTER — Telehealth: Payer: Self-pay | Admitting: Family Medicine

## 2021-07-28 DIAGNOSIS — G2581 Restless legs syndrome: Secondary | ICD-10-CM

## 2021-07-28 NOTE — Telephone Encounter (Signed)
Patient called to see if Dr. Rodman Key would send in referral to a neurologist for her legs. Still not getting better.  ?

## 2021-08-01 ENCOUNTER — Telehealth: Payer: Self-pay

## 2021-08-01 NOTE — Telephone Encounter (Signed)
Pt lvm requesting to cancel trazodone Rx and restart Klonopin. Please advise.  ?

## 2021-08-03 NOTE — Telephone Encounter (Signed)
Please ask why she is wanting to change.  Side effects, ineffective, ect?  If ineffective she can try increasing to 3 tablets at bedtime.  Would prefer to avoid adding benzodiazepine back on as these are not typically indicated for chronic insomnia.   ? ?Thanks! ? ?CM

## 2021-08-06 NOTE — Telephone Encounter (Signed)
Noted  

## 2021-08-06 NOTE — Telephone Encounter (Signed)
Spoke to pt. She states that she stopped taking the trazodone because it, "made me feel funny. I was light-headed some times and sleepy (throughout the day)".  ? ?Per Carolyn [name] she has been sleeping fairly well without any medications.  ?

## 2021-08-09 ENCOUNTER — Other Ambulatory Visit: Payer: Self-pay | Admitting: Family Medicine

## 2021-08-13 ENCOUNTER — Other Ambulatory Visit: Payer: Self-pay | Admitting: Family Medicine

## 2021-08-14 ENCOUNTER — Telehealth: Payer: No Typology Code available for payment source | Admitting: Urgent Care

## 2021-08-14 ENCOUNTER — Telehealth: Payer: Self-pay

## 2021-08-14 ENCOUNTER — Encounter: Payer: Self-pay | Admitting: Urgent Care

## 2021-08-14 DIAGNOSIS — G2581 Restless legs syndrome: Secondary | ICD-10-CM

## 2021-08-14 DIAGNOSIS — G4700 Insomnia, unspecified: Secondary | ICD-10-CM

## 2021-08-14 NOTE — Progress Notes (Signed)
?Virtual Visit Consent  ? ?[name], you are scheduled for a virtual visit with a [facility] provider today. Just as with appointments in the office, your consent must be obtained to participate. Your consent will be active for this visit and any virtual visit you may have with one of our providers in the next 365 days. If you have a MyChart account, a copy of this consent can be sent to you electronically. ? ?As this is a virtual visit, video technology does not allow for your provider to perform a traditional examination. This may limit your provider's ability to fully assess your condition. If your provider identifies any concerns that need to be evaluated in person or the need to arrange testing (such as labs, EKG, etc.), we will make arrangements to do so. Although advances in technology are sophisticated, we cannot ensure that it will always work on either your end or our end. If the connection with a video visit is poor, the visit may have to be switched to a telephone visit. With either a video or telephone visit, we are not always able to ensure that we have a secure connection. ? ?By engaging in this virtual visit, you consent to the provision of healthcare and authorize for your insurance to be billed (if applicable) for the services provided during this visit. Depending on your insurance coverage, you may receive a charge related to this service. ? ?I need to obtain your verbal consent now. Are you willing to proceed with your visit today? Carolyn [name] has provided verbal consent on 08/14/2021 for a virtual visit (video or telephone). [place], PA ? ?Date: 08/14/2021 4:41 PM ? ?Virtual Visit via Video Note  ? ?I, [place], connected with  Carolyn [name]  ([id number], 1950-10-25) on 08/14/21 at  4:30 PM EDT by a video-enabled telemedicine application and verified that I am speaking with the correct person using two identifiers. ? ?Location: ?Patient: Virtual Visit Location  Patient: Home ?Provider: Virtual Visit Location Provider: Home Office ?  ?I discussed the limitations of evaluation and management by telemedicine and the availability of in person appointments. The patient expressed understanding and agreed to proceed.   ? ?History of Present Illness: ?Carolyn [name] is a 71 y.o. who identifies as a female who was assigned female at birth, and is being seen today for medication question. ? ?HPI: Pleasant 71yo female with a known hx of insomnia and RLS presented for video visit primarily requesting refill of clonazepam. She states she has suffered from insomnia for years. She had been prescribed clonazepam and states it is the only thing that helps her sleep. She was recently tried on trazodone, but states it made her somnolent during the day and couldn't tolerate the side effects. She felt it was ineffective. She states she currently has some "left over" klonopin and took one last night. She reports that was the only thing that made her finally sleep. She does endorse watching TV or using electronics prior to sleep to "wind her down." She also endorses drinking extensive caffeinated beverages all throughout the day. ? ? ?Problems:  ?Patient Active Problem List  ? Diagnosis Date Noted  ? Hearing loss due to cerumen impaction, right 06/23/2021  ? Insomnia 04/08/2021  ? Tobacco use disorder 02/10/2017  ? Peripheral neuropathic pain 08/04/2016  ? Pulmonary nodule 07/15/2016  ? Constipation 04/13/2016  ? Aortic atherosclerosis ([place]) 01/13/2016  ? COPD (chronic obstructive pulmonary disease) ([place]) 01/13/2016  ? Depression 07/11/2015  ?  GAD (generalized anxiety disorder) 07/11/2015  ? Allergic rhinitis 07/11/2015  ? Hypothyroidism 07/11/2015  ? Restless leg syndrome 07/11/2015  ? ASCUS with positive high risk HPV 03/04/2015  ? Low grade squamous intraepith lesion on cytologic smear cervix ([facility]) 01/11/2015  ? Atypical glandular cells of undetermined significance (AGUS) on cervical Pap  smear 11/06/2013  ? Postmenopausal state 10/09/2013  ? History of cervical dysplasia 10/09/2013  ?  ?Allergies:  ?Allergies  ?Allergen Reactions  ? Azithromycin Rash  ? ?Medications:  ?Current Outpatient Medications:  ?  aspirin 81 MG EC tablet, TAKE 1 TABLET BY MOUTH EVERY DAY, Disp: 90 tablet, Rfl: 3 ?  atorvastatin (LIPITOR) 10 MG tablet, Take 1 tablet (10 mg total) by mouth daily., Disp: 90 tablet, Rfl: 3 ?  baclofen (LIORESAL) 10 MG tablet, Take 1 tablet (10 mg total) by mouth 3 (three) times daily., Disp: 180 tablet, Rfl: 2 ?  Calcium Carbonate (CALCIUM 600 PO), Take by mouth., Disp: , Rfl:  ?  citalopram (CELEXA) 40 MG tablet, Take 1 tablet (40 mg total) by mouth daily., Disp: 90 tablet, Rfl: 3 ?  fluticasone (FLONASE) 50 MCG/ACT nasal spray, SPRAY 1 SPRAY INTO BOTH NOSTRILS DAILY, Disp: 16 g, Rfl: 11 ?  fluticasone furoate-vilanterol (BREO ELLIPTA) 100-25 MCG/ACT AEPB, Inhale 1 puff into the lungs daily., Disp: 1 each, Rfl: 11 ?  Garlic 9798 MG CAPS, Take by mouth., Disp: , Rfl:  ?  Ipratropium-Albuterol (COMBIVENT) 20-100 MCG/ACT AERS respimat, Inhale 1 puff into the lungs every 6 (six) hours as needed for wheezing or shortness of breath., Disp: 1 each, Rfl: 5 ?  levothyroxine (SYNTHROID) 100 MCG tablet, Take 1 tablet (100 mcg total) by mouth daily before breakfast., Disp: 90 tablet, Rfl: 3 ?  Multiple Vitamins-Minerals (CENTRUM SILVER ADULT 50+ PO), Take 1 tablet by mouth daily., Disp: , Rfl:  ?  Omega-3 Fatty Acids (FISH OIL) 1000 MG CAPS, Take by mouth., Disp: , Rfl:  ?  rOPINIRole (REQUIP) 3 MG tablet, Take 1/2 tab in the morning, 1/2 tab in the afternoon and full tab at bedtime., Disp: 180 tablet, Rfl: 1 ?  traZODone (DESYREL) 50 MG tablet, Take 1-2 tablets (50-100 mg total) by mouth at bedtime as needed for sleep., Disp: 90 tablet, Rfl: 2 ? ?Observations/Objective: ?Patient is well-developed, well-nourished in no acute distress.  ?Resting comfortably NAD at home.  ?Head is normocephalic, atraumatic.   ?No labored breathing. No cough ?Speech is clear and coherent with logical content.  ?Patient is alert and oriented at baseline.  ? ? ?Assessment and Plan: ?1. Insomnia, unspecified type ? ?2. Restless leg syndrome ? ?Pt with longstanding hx of insomnia. She is not interested in any other options at this time apart from klonopin given its effectiveness in the past. I offered a temporary script for hydroxyzine until she is able to discuss with her PCP, but she deferred. Admits to using benadryl nightly without relief. We did extensively discuss sleep hygiene. She is stimulating her mind with electronics prior to sleep; recommended reading a book, or using a stress relieving adult coloring book to wind down. Also, recommended stopping ALL caffeinated beverages after 2pm. ?RLS - on ropirinole. Pt requesting benzos to aid in both chronic conditions. Again, discussed limitations to this telemed, and recommended she reach out to her PCP to schedule a follow up visit. ? ?Follow Up Instructions: ?I discussed the assessment and treatment plan with the patient. The patient was provided an opportunity to ask questions and all were answered. The patient  agreed with the plan and demonstrated an understanding of the instructions.  A copy of instructions were sent to the patient via MyChart unless otherwise noted below.  ? ? ? ?The patient was advised to call back or seek an in-person evaluation if the symptoms worsen or if the condition fails to improve as anticipated. ? ?Time:  ?I spent 9 minutes with the patient via telehealth technology discussing the above problems/concerns.   ? ?Shravan Salahuddin L Khamani Daniely, PA  ?

## 2021-08-14 NOTE — Patient Instructions (Signed)
?[name], thank you for joining Chaney Malling, PA for today's virtual visit.  While this provider is not your primary care provider (PCP), if your PCP is located in our provider database this encounter information will be shared with them immediately following your visit. ? ?Consent: ?(Patient) Carolyn [name] provided verbal consent for this virtual visit at the beginning of the encounter. ? ?Current Medications: ? ?Current Outpatient Medications:  ?  aspirin 81 MG EC tablet, TAKE 1 TABLET BY MOUTH EVERY DAY, Disp: 90 tablet, Rfl: 3 ?  atorvastatin (LIPITOR) 10 MG tablet, Take 1 tablet (10 mg total) by mouth daily., Disp: 90 tablet, Rfl: 3 ?  baclofen (LIORESAL) 10 MG tablet, Take 1 tablet (10 mg total) by mouth 3 (three) times daily., Disp: 180 tablet, Rfl: 2 ?  Calcium Carbonate (CALCIUM 600 PO), Take by mouth., Disp: , Rfl:  ?  citalopram (CELEXA) 40 MG tablet, Take 1 tablet (40 mg total) by mouth daily., Disp: 90 tablet, Rfl: 3 ?  fluticasone (FLONASE) 50 MCG/ACT nasal spray, SPRAY 1 SPRAY INTO BOTH NOSTRILS DAILY, Disp: 16 g, Rfl: 11 ?  fluticasone furoate-vilanterol (BREO ELLIPTA) 100-25 MCG/ACT AEPB, Inhale 1 puff into the lungs daily., Disp: 1 each, Rfl: 11 ?  Garlic 0347 MG CAPS, Take by mouth., Disp: , Rfl:  ?  Ipratropium-Albuterol (COMBIVENT) 20-100 MCG/ACT AERS respimat, Inhale 1 puff into the lungs every 6 (six) hours as needed for wheezing or shortness of breath., Disp: 1 each, Rfl: 5 ?  levothyroxine (SYNTHROID) 100 MCG tablet, Take 1 tablet (100 mcg total) by mouth daily before breakfast., Disp: 90 tablet, Rfl: 3 ?  Multiple Vitamins-Minerals (CENTRUM SILVER ADULT 50+ PO), Take 1 tablet by mouth daily., Disp: , Rfl:  ?  Omega-3 Fatty Acids (FISH OIL) 1000 MG CAPS, Take by mouth., Disp: , Rfl:  ?  rOPINIRole (REQUIP) 3 MG tablet, Take 1/2 tab in the morning, 1/2 tab in the afternoon and full tab at bedtime., Disp: 180 tablet, Rfl: 1 ?  traZODone (DESYREL) 50 MG tablet, Take 1-2 tablets  (50-100 mg total) by mouth at bedtime as needed for sleep., Disp: 90 tablet, Rfl: 2  ? ?Medications ordered in this encounter:  ?No orders of the defined types were placed in this encounter. ?  ? ?*If you need refills on other medications prior to your next appointment, please contact your pharmacy* ? ?Follow-Up: ?Call back or seek an in-person evaluation if the symptoms worsen or if the condition fails to improve as anticipated. ? ?Other Instructions ?You were offered a temporary script for hydroxyzine until able to discuss medication options with your PCP.  ?We did extensively discuss sleep hygiene.  ?I recommend turning off ALL electronics at least one hour prior to initiating sleep - no tablet, phone, tv. Try reading a book, or using a stress relieving adult coloring book to wind down.  ?Please stop drinking ALL caffeinated beverages after 2pm. (Coffee, soda, tea, etc) as this can also keep you awake. ?Please call your PCP tomorrow to schedule a follow up visit.  ? ? ?If you have been instructed to have an in-person evaluation today at a local Urgent Care facility, please use the link below. It will take you to a list of all of our available [facility], including address, phone number and hours of operation. Please do not delay care.  ? Urgent Cares ? ?If you or a family member do not have a primary care provider, use the link below  to schedule a visit and establish care. When you choose a [facility] primary care physician or advanced practice provider, you gain a long-term partner in health. ?Find a Primary Care Provider ? ?Learn more about [facility]'s in-office and virtual care options: ?[facility]  ?

## 2021-08-14 NOTE — Telephone Encounter (Signed)
LVM for patient to call back to get appt scheduled. AMUCK 

## 2021-08-14 NOTE — Telephone Encounter (Signed)
Please contact Ms. Carolyn [name] and advise that Dr. Zigmund Daniel prefers a visit (virtual or in-person) to discuss sleeping medication. Per his previous note, "Would prefer to avoid adding benzodiazepine back on as these are not typically indicated for chronic insomnia.' ?Also, she stated that she was sleeping better after stopping the trazodone during 5/3 phone call.  ? ?Thanks ?

## 2021-08-20 ENCOUNTER — Encounter: Payer: Self-pay | Admitting: Family Medicine

## 2021-08-20 ENCOUNTER — Telehealth (INDEPENDENT_AMBULATORY_CARE_PROVIDER_SITE_OTHER): Payer: No Typology Code available for payment source | Admitting: Family Medicine

## 2021-08-20 DIAGNOSIS — G4700 Insomnia, unspecified: Secondary | ICD-10-CM

## 2021-08-20 MED ORDER — CLONAZEPAM 0.5 MG PO TABS: 0.5000 mg | ORAL_TABLET | Freq: Every day | ORAL | 3 refills | Status: DC

## 2021-08-24 NOTE — Progress Notes (Signed)
Carolyn [name] - 71 y.o. female MRN [id number]  Date of birth: 05-18-1950   This visit type was conducted due to national recommendations for restrictions regarding the COVID-19 Pandemic (e.g. social distancing).  This format is felt to be most appropriate for this patient at this time.  All issues noted in this document were discussed and addressed.  No physical exam was performed (except for noted visual exam findings with Video Visits).  I discussed the limitations of evaluation and management by telemedicine and the availability of in person appointments. The patient expressed understanding and agreed to proceed.  I connected withNAME@ on 08/24/21 at 10:30 AM EDT by a video enabled telemedicine application and verified that I am speaking with the correct person using two identifiers.  Present at visit: [name], DO [name]   Patient Location: [place] [address] [address]   Provider location:   [place]  Chief Complaint  Patient presents with   Insomnia    HPI  Carolyn [name] is a 71 y.o. female who presents via audio/video conferencing for a telehealth visit today.  She is following up on insomnia.  She continues to have problems with RLS and insomnia.  She has not had good success with trazodone or doxepin.  She is taking ropinirole at max dose.  She has also restarted her clonazepam as this has controlled her symptoms better.  She does have upcoming neurology visit for RLS.    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   left nare   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   left upper shin   SCC (squamous cell carcinoma) in situ 03/19/2016   right pinky   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   right thigh, left upper shin   SCC (squamous cell carcinoma) well diff 12/13/2008   left hand   SCC (squamous cell carcinoma) well diff x2 12/25/2015   left and right forearm   Thyroid disease     Past Surgical History:  Procedure Laterality Date   CERVICAL CONIZATION W/BX     EYE SURGERY Bilateral    Cataracts   MELANOMA EXCISION  08/2014   back   SHOULDER SURGERY Right     Family History  Problem Relation Age of Onset   Heart disease Mother    Aneurysm Mother    Heart disease Father    Kidney failure Father    Cancer Sister    Cancer Brother    Cirrhosis Daughter    Cancer Sister        Bronchial    Social History   Socioeconomic History   Marital status: Divorced    Spouse name: Not on file   Number of children: 2   Years of education: Not on file   Highest education level: GED  or equivalent  Occupational History   Occupation: disabled  Tobacco Use   Smoking status: Every Day    Packs/day: 0.50    Types: Cigarettes    Start date: 04/06/1968    Passive exposure: Never   Smokeless tobacco: Never  Vaping Use   Vaping Use: Never used  Substance and Sexual Activity   Alcohol use: Not Currently   Drug use: No   Sexual activity: Not on file  Other Topics Concern   Not on file  Social History Narrative   Not on file   Social Determinants of Health   Financial Resource Strain: Not on file  Food Insecurity: Not on file  Transportation Needs: Not on file  Physical Activity: Not on file  Stress: Not on file  Social Connections: Not on file  Intimate Partner Violence: Not on file     Current  Outpatient Medications:    aspirin 81 MG EC tablet, TAKE 1 TABLET BY MOUTH EVERY DAY, Disp: 90 tablet, Rfl: 3   atorvastatin (LIPITOR) 10 MG tablet, Take 1 tablet (10 mg total) by mouth daily., Disp: 90 tablet, Rfl: 3   baclofen (LIORESAL) 10 MG tablet, Take 1 tablet (10 mg total) by mouth 3 (three) times daily., Disp: 180 tablet, Rfl: 2   Calcium Carbonate (CALCIUM 600 PO), Take by mouth., Disp: , Rfl:    citalopram (CELEXA) 40 MG tablet, Take 1 tablet (40 mg total) by mouth daily., Disp: 90 tablet, Rfl: 3   clonazePAM (KLONOPIN) 0.5 MG tablet, Take 1 tablet (0.5 mg total) by mouth at bedtime., Disp: 30 tablet, Rfl: 3   fluticasone (FLONASE) 50 MCG/ACT nasal spray, SPRAY 1 SPRAY INTO BOTH NOSTRILS DAILY, Disp: 16 g, Rfl: 11   fluticasone furoate-vilanterol (BREO ELLIPTA) 100-25 MCG/ACT AEPB, Inhale 1 puff into the lungs daily., Disp: 1 each, Rfl: 11   Garlic 2671 MG CAPS, Take by mouth., Disp: , Rfl:    Ipratropium-Albuterol (COMBIVENT) 20-100 MCG/ACT AERS respimat, Inhale 1 puff into the lungs every 6 (six) hours as needed for wheezing or shortness of breath., Disp: 1 each, Rfl: 5   levothyroxine (SYNTHROID) 100 MCG tablet, Take 1 tablet (100 mcg total) by mouth daily before breakfast., Disp: 90 tablet, Rfl: 3   Multiple Vitamins-Minerals (CENTRUM SILVER ADULT 50+ PO), Take 1 tablet by mouth daily., Disp: , Rfl:    Omega-3 Fatty Acids (FISH OIL) 1000 MG CAPS, Take by mouth., Disp: , Rfl:    rOPINIRole (REQUIP) 3 MG tablet, Take 1/2 tab in the morning, 1/2 tab in the afternoon and full tab at bedtime., Disp: 180 tablet, Rfl: 1  EXAM:  VITALS per patient if applicable: Ht '5\' 5"'$  (1.651 m)   Wt 166 lb (75.3 kg)   BMI 27.62 kg/m   GENERAL: alert, oriented, appears well and in no acute distress  HEENT: atraumatic, conjunttiva clear, no obvious abnormalities on inspection of external nose and ears  NECK: normal movements of the head and neck  LUNGS: on inspection no signs of respiratory  distress, breathing rate appears normal, no obvious gross SOB, gasping or wheezing  CV: no obvious cyanosis  MS: moves all visible extremities without noticeable abnormality  PSYCH/NEURO: pleasant and cooperative, no obvious depression or anxiety, speech and thought processing grossly intact  ASSESSMENT AND PLAN:  Discussed the following assessment and plan:  Insomnia She continues to have insomnia and RLS.  Upcoming visit with neurology regarding RLS.  She will continue clonazepam for now as she has not been successful with  more conservative options.       I discussed the assessment and treatment plan with the patient. The patient was provided an opportunity to ask questions and all were answered. The patient agreed with the plan and demonstrated an understanding of the instructions.   The patient was advised to call back or seek an in-person evaluation if the symptoms worsen or if the condition fails to improve as anticipated.    [name], DO

## 2021-08-24 NOTE — Assessment & Plan Note (Signed)
She continues to have insomnia and RLS.  Upcoming visit with neurology regarding RLS.  She will continue clonazepam for now as she has not been successful with more conservative options.

## 2021-08-29 ENCOUNTER — Ambulatory Visit: Payer: No Typology Code available for payment source | Admitting: Psychiatry

## 2021-08-29 ENCOUNTER — Encounter: Payer: Self-pay | Admitting: Psychiatry

## 2021-08-29 VITALS — BP 126/80 | HR 70 | Ht 65.0 in | Wt 167.0 lb

## 2021-08-29 DIAGNOSIS — G2581 Restless legs syndrome: Secondary | ICD-10-CM | POA: Diagnosis not present

## 2021-08-29 DIAGNOSIS — D509 Iron deficiency anemia, unspecified: Secondary | ICD-10-CM | POA: Diagnosis not present

## 2021-08-29 MED ORDER — PREGABALIN 75 MG PO CAPS: 75.0000 mg | ORAL_CAPSULE | Freq: Every evening | ORAL | 6 refills | Status: DC | PRN

## 2021-08-29 NOTE — Patient Instructions (Addendum)
Blood work today Start Lyrica 75 mg at bedtime   Behavioral strategies --  ?Mental alerting activities, such as working on a computer or doing crossword puzzles, at times of rest or boredom ?Avoidance of aggravating factors, including consideration of withdrawal of possibly predisposing medications  ?Moderate regular exercise  ?A trial of abstinence from caffeine and alcohol ?For symptomatic relief - walking, bicycling, soaking the affected limbs, and leg massage, including pneumatic compression  ?Short daily hemodialysis for patients with end-stage kidney disease

## 2021-08-29 NOTE — Progress Notes (Signed)
[facility]  PATIENT: Carolyn [name] DOB: 08/16/1950  REFERRING CLINICIAN: [name], DO HISTORY FROM: self REASON FOR VISIT: RLS   HISTORICAL  CHIEF COMPLAINT:  Chief Complaint  Patient presents with   New Patient (Initial Visit)    Rm 2, alone. Pt referred for RLS eval. Pt c/o leg pn, due to spasms, tingling and locking in joints. Take '3mg'$  of ropinirole TID instead on 1.'5mg'$  TID as prescribed.     HISTORY OF PRESENT ILLNESS:  The patient presents for evaluation of restless leg syndrome. She has had RLS since she was in her 50s. Reports a crawling and itching sensation in bilateral legs from her knees down. This is worse when she lies still and she will have an urge to move her legs. Has cramps in her legs as well. Symptoms are most frequently at night but can occur during the day as well. She usually wakes up with her legs hurting at least once per night.  Takes ropinirole as needed, usually takes it 2-3 times per day. This does not cause side effects. Takes Klonopin 0.5 mg every night to help her fall asleep. She is also prescribed baclofen 10 mg TID PRN for cramping, which she finds helpful.  She previously tried gabapentin but was unable to tolerate it due to urinary incontinence.  Previous therapies: Klonopin 0.5 mg QHS Requip 1.5/1.5/3 mg Baclofen 10 mg TID Gabapentin 300 mg TID - urinary incontinence at night  OTHER MEDICAL CONDITIONS: hypothyroidism, HLD, COPD   REVIEW OF SYSTEMS: Full 14 system review of systems performed and negative with exception of: restless legs  ALLERGIES: Allergies  Allergen Reactions   Trazodone And Nefazodone Shortness Of Breath   Azithromycin Rash    HOME MEDICATIONS: Outpatient Medications Prior to Visit  Medication Sig Dispense Refill   aspirin 81 MG EC tablet TAKE 1 TABLET BY MOUTH EVERY DAY 90 tablet 3   atorvastatin (LIPITOR) 10 MG tablet Take 1 tablet (10 mg total) by mouth daily. 90 tablet 3    baclofen (LIORESAL) 10 MG tablet Take 1 tablet (10 mg total) by mouth 3 (three) times daily. 180 tablet 2   Calcium Carbonate (CALCIUM 600 PO) Take by mouth.     citalopram (CELEXA) 40 MG tablet Take 1 tablet (40 mg total) by mouth daily. 90 tablet 3   clonazePAM (KLONOPIN) 0.5 MG tablet Take 1 tablet (0.5 mg total) by mouth at bedtime. 30 tablet 3   fluticasone (FLONASE) 50 MCG/ACT nasal spray SPRAY 1 SPRAY INTO BOTH NOSTRILS DAILY 16 g 11   fluticasone furoate-vilanterol (BREO ELLIPTA) 100-25 MCG/ACT AEPB Inhale 1 puff into the lungs daily. 1 each 11   Garlic 0109 MG CAPS Take by mouth.     Ipratropium-Albuterol (COMBIVENT) 20-100 MCG/ACT AERS respimat Inhale 1 puff into the lungs every 6 (six) hours as needed for wheezing or shortness of breath. 1 each 5   levothyroxine (SYNTHROID) 100 MCG tablet Take 1 tablet (100 mcg total) by mouth daily before breakfast. 90 tablet 3   Multiple Vitamins-Minerals (CENTRUM SILVER ADULT 50+ PO) Take 1 tablet by mouth daily.     Omega-3 Fatty Acids (FISH OIL) 1000 MG CAPS Take by mouth.     rOPINIRole (REQUIP) 3 MG tablet Take 1/2 tab in the morning, 1/2 tab in the afternoon and full tab at bedtime. 180 tablet 1   No facility-administered medications prior to visit.    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   left upper shin   SCC (squamous cell carcinoma) in situ  03/19/2016   right pinky   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   right thigh, left upper shin   SCC (squamous cell carcinoma) well diff 12/13/2008   left hand   SCC (squamous cell carcinoma) well diff x2 12/25/2015   left and right forearm   Thyroid disease     PAST SURGICAL HISTORY: Past Surgical History:  Procedure Laterality Date   CERVICAL CONIZATION W/BX     EYE SURGERY Bilateral    Cataracts   MELANOMA EXCISION  08/2014   back   SHOULDER SURGERY Right     FAMILY HISTORY: Family History  Problem Relation Age of Onset   Heart disease Mother    Aneurysm Mother    Heart disease Father    Kidney failure Father    Cancer Sister    Cancer Brother    Cirrhosis Daughter    Cancer Sister        Bronchial    SOCIAL HISTORY: Social History   Socioeconomic History   Marital status: Divorced    Spouse name: Not on file   Number of children: 2   Years of education: Not on file   Highest education level: GED or equivalent  Occupational History   Occupation: disabled  Tobacco Use   Smoking status: Every Day    Packs/day: 0.50    Types: Cigarettes    Start date: 04/06/1968    Passive exposure: Never   Smokeless tobacco: Never  Vaping Use   Vaping Use: Never used  Substance and Sexual Activity   Alcohol use: Not Currently   Drug use: No   Sexual activity: Not on file  Other Topics Concern   Not on file  Social History Narrative   Lives alone   R handed   Caffeine: half a litter of soda in a day.    Social Determinants of Health   Financial Resource Strain: Not on file  Food Insecurity: Not on file  Transportation Needs: Not on file  Physical Activity: Not on file  Stress: Not on file  Social Connections: Not on file  Intimate Partner Violence: Not on file     PHYSICAL EXAM  GENERAL EXAM/CONSTITUTIONAL: Vitals:  Vitals:   08/29/21 1102  BP: 126/80  Pulse: 70  Weight: 167 lb (75.8 kg)  Height: '5\' 5"'$  (1.651 m)   Body mass index  is 27.79 kg/m. Wt Readings from Last 3 Encounters:  08/29/21 167 lb (75.8 kg)  08/20/21 166 lb (75.3 kg)  07/15/21 166 lb (75.3 kg)   Patient is in no distress; well developed, nourished and groomed; neck is supple   NEUROLOGIC: MENTAL STATUS:  awake, alert, oriented to person, place and time recent and remote memory intact normal attention and concentration  CRANIAL NERVE:  2nd, 3rd, 4th, 6th - pupils equal and reactive to light, visual fields full to confrontation, extraocular muscles intact, no nystagmus 5th - facial sensation symmetric 7th - facial strength symmetric 8th - hearing intact 9th - palate elevates symmetrically, uvula midline 11th - shoulder shrug symmetric 12th - tongue protrusion midline  MOTOR:  normal bulk and tone, full strength in the BUE, BLE  SENSORY:  normal and symmetric to light touch all 4 extremities  COORDINATION:  finger-nose-finger intact bilaterally  REFLEXES:  deep tendon reflexes present and symmetric  GAIT/STATION:  normal     DIAGNOSTIC DATA (LABS, IMAGING, TESTING) - I reviewed patient records, labs, notes, testing and imaging myself where available.  Lab Results  Component Value Date   WBC 8.0 04/08/2021   HGB 14.0 04/08/2021   HCT 42.2 04/08/2021   MCV 87.7 04/08/2021   PLT 256 04/08/2021      Component Value Date/Time   NA 134 (L) 04/08/2021 0000   NA 131 (L) 10/14/2018 1114   K 4.8 04/08/2021 0000   CL 97 (L) 04/08/2021 0000   CO2 30 04/08/2021 0000   GLUCOSE 97 04/08/2021 0000   BUN 11 04/08/2021 0000   BUN 11 10/14/2018 1114   CREATININE 0.78 04/08/2021 0000   CALCIUM 9.9 04/08/2021 0000   PROT 6.8 04/08/2021 0000   PROT 6.8 10/14/2018 1114   ALBUMIN 4.6 10/14/2018 1114   AST 20 04/08/2021 0000   ALT 16 04/08/2021 0000   ALKPHOS 60 10/14/2018 1114   BILITOT 0.6 04/08/2021 0000   BILITOT 0.5 10/14/2018 1114   GFRNONAA 76 10/14/2018 1114   GFRAA 88 10/14/2018 1114   Lab Results  Component Value  Date   CHOL 180 04/08/2021   HDL 90 04/08/2021   LDLCALC 74 04/08/2021   TRIG 76 04/08/2021   CHOLHDL 2.0 04/08/2021   No results found for: HGBA1C No results found for: VITAMINB12 Lab Results  Component Value Date   TSH 1.76 04/08/2021     ASSESSMENT AND PLAN  71 y.o. year old female with a history of hypothyroidism, HLD, COPD who presents for evaluation of restless leg syndrome. Symptoms remain uncontrolled with ropinirole, clonazepam, and baclofen. Discussed risk of augmentation with higher doses of ropinirole. She is hesitant to reduce the dose at this time. Will start Lyrica at bedtime, then plan to wean ropinirole once symptoms have improved. Will check iron levels and electrolytes today.   1. RLS (restless legs syndrome)       PLAN: -Blood work: Iron panel, magnesium, calcium -Start Lyrica 75 mg QHS -Continue ropinirole 1.5/1.5/3 mg for now, consider weaning at next visit. Consider switching to rotigotine patch  Orders Placed This Encounter  Procedures   Iron, TIBC and Ferritin Panel   Magnesium   Calcium    Meds ordered this encounter  Medications   pregabalin (LYRICA) 75 MG capsule    Sig: Take 1 capsule (75 mg total) by mouth at bedtime as needed.    Dispense:  30 capsule    Refill:  6    Return in about 5 months (around 01/29/2022).    Genia Harold, MD 08/29/21 12:06 PM  I spent an average of 55 minutes chart reviewing and counseling the patient, with at least 50% of the time face to face with the patient.   [facility] [address], [place], [place] [address] [phone]

## 2021-08-30 LAB — MAGNESIUM: Magnesium: 1.9 mg/dL (ref 1.6–2.3)

## 2021-08-30 LAB — CALCIUM: Calcium: 9.7 mg/dL (ref 8.7–10.3)

## 2021-08-30 LAB — IRON,TIBC AND FERRITIN PANEL
Ferritin: 14 ng/mL — ABNORMAL LOW (ref 15–150)
Iron Saturation: 11 % — ABNORMAL LOW (ref 15–55)
Iron: 54 ug/dL (ref 27–139)
Total Iron Binding Capacity: 497 ug/dL — ABNORMAL HIGH (ref 250–450)
UIBC: 443 ug/dL — ABNORMAL HIGH (ref 118–369)

## 2021-09-02 ENCOUNTER — Other Ambulatory Visit: Payer: Self-pay | Admitting: Psychiatry

## 2021-09-02 MED ORDER — FERROUS SULFATE 325 (65 FE) MG PO TABS: 325.0000 mg | ORAL_TABLET | Freq: Every day | ORAL | 6 refills | Status: DC

## 2021-10-01 ENCOUNTER — Emergency Department (INDEPENDENT_AMBULATORY_CARE_PROVIDER_SITE_OTHER): Payer: No Typology Code available for payment source

## 2021-10-01 ENCOUNTER — Encounter: Payer: Self-pay | Admitting: Emergency Medicine

## 2021-10-01 ENCOUNTER — Emergency Department
Admission: EM | Admit: 2021-10-01 | Discharge: 2021-10-01 | Disposition: A | Discharge status: Home / Self Care | Payer: No Typology Code available for payment source | Source: Home / Self Care | Attending: Family Medicine | Admitting: Family Medicine

## 2021-10-01 DIAGNOSIS — M7989 Other specified soft tissue disorders: Secondary | ICD-10-CM | POA: Diagnosis not present

## 2021-10-01 DIAGNOSIS — M79671 Pain in right foot: Secondary | ICD-10-CM | POA: Diagnosis not present

## 2021-10-01 DIAGNOSIS — S9031XA Contusion of right foot, initial encounter: Secondary | ICD-10-CM | POA: Diagnosis not present

## 2021-10-01 NOTE — Discharge Instructions (Signed)
Wear Ace wrap and elevate when foot is swollen Limit walking and exercise while foot is swollen and painful See Dr. Dianah Field in follow-up

## 2021-10-01 NOTE — ED Triage Notes (Signed)
Dropped an object on her foot in the yard a week ago  Bruising & swelling noted  No meds - ibuprofen the first few days  Foot has always had partial numbness - worse now Foot surgery in early 2000, reconstruction per pt  Pt has concerns she may have damaged surgical ara  Pin in ankle from that surgery

## 2021-10-01 NOTE — ED Provider Notes (Signed)
[facility]    CSN: [id number] Arrival date & time: 10/01/21  1342      History   Chief Complaint Chief Complaint  Patient presents with   Foot Pain    right    HPI Carolyn [name] is a 71 y.o. female.   HPI  Patient states that she dropped a heavy object on her foot about a week ago.  She states that her foot is been swollen ever since then.  The pain is improved and she can walk without limp.  She is concerned that the foot is continuing to swell.  She states that she feels like there is "heat in it".  She states she had foot surgery about 20 years ago and is concerned she might have disrupted the "pin" that was placed. Patient is a smoker with known atherosclerotic disease.  Also history of COPD.  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   left nare   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   left upper shin   SCC (squamous cell carcinoma) in situ 03/19/2016   right pinky   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   right thigh, left upper shin   SCC (squamous cell carcinoma) well diff 12/13/2008   left hand   SCC (squamous cell carcinoma) well diff x2 12/25/2015   left and right forearm   Thyroid disease     Patient Active Problem List   Diagnosis Date Noted   Hearing loss due to cerumen impaction, right 06/23/2021   Insomnia 04/08/2021   Tobacco use disorder 02/10/2017   Peripheral neuropathic pain  08/04/2016   Pulmonary nodule 07/15/2016   Constipation 04/13/2016   Aortic atherosclerosis ([place]) 01/13/2016   COPD (chronic obstructive pulmonary disease) ([place]) 01/13/2016   Depression 07/11/2015   GAD (generalized anxiety disorder) 07/11/2015   Allergic rhinitis 07/11/2015   Hypothyroidism 07/11/2015   Restless leg syndrome 07/11/2015   ASCUS with positive high risk HPV 03/04/2015   Atypical glandular cells of undetermined significance (AGUS) on cervical Pap smear 11/06/2013   Postmenopausal state 10/09/2013   History of cervical dysplasia 10/09/2013    Past Surgical History:  Procedure Laterality Date   CERVICAL CONIZATION W/BX     EYE SURGERY Bilateral    Cataracts   MELANOMA EXCISION  08/2014   back   SHOULDER SURGERY Right     OB History     Gravida  3   Para  2   Term  2   Preterm  0   AB  1   Living  2      SAB  1   IAB      Ectopic      Multiple      Live Births  2            Home Medications    Prior to Admission medications  Medication Sig Start Date End Date Taking? Authorizing Provider  aspirin 81 MG EC tablet TAKE 1 TABLET BY MOUTH EVERY DAY 11/11/18   Terald Sleeper, PA-C  atorvastatin (LIPITOR) 10 MG tablet Take 1 tablet (10 mg total) by mouth daily. 04/08/21   [name], DO  baclofen (LIORESAL) 10 MG tablet Take 1 tablet (10 mg total) by mouth 3 (three) times daily. 04/08/21   [name], DO  Calcium Carbonate (CALCIUM 600 PO) Take by mouth.    [provider]  citalopram (CELEXA) 40 MG tablet Take 1 tablet (40 mg total) by mouth daily. Patient not taking: Reported on 10/01/2021 04/08/21   [name], DO  clonazePAM (KLONOPIN) 0.5 MG tablet Take 1 tablet (0.5 mg total) by mouth at bedtime. 08/20/21   [name], DO  ferrous sulfate 325 (65 FE) MG tablet Take 1 tablet (325 mg total) by mouth daily with breakfast. 09/02/21   Genia Harold, MD  fluticasone (FLONASE) 50 MCG/ACT nasal spray SPRAY 1 SPRAY INTO BOTH  NOSTRILS DAILY 04/09/21   [name], DO  fluticasone furoate-vilanterol (BREO ELLIPTA) 100-25 MCG/ACT AEPB Inhale 1 puff into the lungs daily. 04/09/21   [name], DO  Garlic 0960 MG CAPS Take by mouth.    [provider]  Ipratropium-Albuterol (COMBIVENT) 20-100 MCG/ACT AERS respimat Inhale 1 puff into the lungs every 6 (six) hours as needed for wheezing or shortness of breath. 06/23/21   Silverio Decamp, MD  levothyroxine (SYNTHROID) 100 MCG tablet Take 1 tablet (100 mcg total) by mouth daily before breakfast. 04/08/21   [name], DO  Multiple Vitamins-Minerals (CENTRUM SILVER ADULT 50+ PO) Take 1 tablet by mouth daily.    [provider]  Omega-3 Fatty Acids (FISH OIL) 1000 MG CAPS Take by mouth.    [provider]  pregabalin (LYRICA) 75 MG capsule Take 1 capsule (75 mg total) by mouth at bedtime as needed. 08/29/21   Genia Harold, MD  rOPINIRole (REQUIP) 3 MG tablet Take 1/2 tab in the morning, 1/2 tab in the afternoon and full tab at bedtime. 07/15/21   [name], DO    Family History Family History  Problem Relation Age of Onset   Heart disease Mother    Aneurysm Mother    Heart disease Father    Kidney failure Father    Cancer Sister    Cancer Brother    Cirrhosis Daughter    Cancer Sister        Bronchial    Social History Social History   Tobacco Use   Smoking status: Every Day    Packs/day: 0.50    Types: Cigarettes    Start date: 04/06/1968    Passive exposure: Never   Smokeless tobacco: Never  Vaping Use   Vaping Use: Never used  Substance Use Topics   Alcohol use: Not Currently   Drug use: No     Allergies   Trazodone and nefazodone and Azithromycin   Review of Systems Review of Systems See HPI  Physical Exam Triage Vital Signs ED Triage Vitals  Enc Vitals Group     BP 10/01/21 1415 124/69     Pulse Rate 10/01/21 1415 76     Resp 10/01/21 1415 18     Temp 10/01/21 1415 98.8 F (37.1 C)     Temp  Source 10/01/21 1415 Oral     SpO2 10/01/21 1415 95 %     Weight 10/01/21 1418 165 lb (74.8 kg)  Height 10/01/21 1418 '5\' 5"'$  (1.651 m)     Head Circumference --      Peak Flow --      Pain Score 10/01/21 1418 3     Pain Loc --      Pain Edu? --      Excl. in [place]? --    No data found.  Updated Vital Signs BP 124/69 (BP Location: Right Arm)   Pulse 76   Temp 98.8 F (37.1 C) (Oral)   Resp 18   Ht '5\' 5"'$  (1.651 m)   Wt 74.8 kg   SpO2 95%   BMI 27.46 kg/m       Physical Exam Constitutional:      General: She is not in acute distress.    Appearance: She is well-developed.  HENT:     Head: Normocephalic and atraumatic.  Eyes:     Conjunctiva/sclera: Conjunctivae normal.     Pupils: Pupils are equal, round, and reactive to light.  Cardiovascular:     Rate and Rhythm: Normal rate.  Pulmonary:     Effort: Pulmonary effort is normal. No respiratory distress.  Abdominal:     General: There is no distension.     Palpations: Abdomen is soft.  Musculoskeletal:        General: Normal range of motion.     Cervical back: Normal range of motion.     Right lower leg: Edema present.     Left lower leg: Edema present.       Feet:     Comments: Right greater than left edema.  Skin:    General: Skin is warm and dry.  Neurological:     Mental Status: She is alert.     Gait: Gait normal.  Psychiatric:        Mood and Affect: Mood normal.        Behavior: Behavior normal.      UC Treatments / Results  Labs (all labs ordered are listed, but only abnormal results are displayed) Labs Reviewed - No data to display  EKG   Radiology DG Foot Complete Right  Result Date: 10/01/2021 CLINICAL DATA:  Pain and swelling of the foot, dropped object on foot 1 week ago. EXAM: RIGHT FOOT COMPLETE - 3+ VIEW COMPARISON:  None Available. FINDINGS: Anchor screw noted in the calcaneus. Plantar and Achilles calcaneal spurs. I do not perceive a fracture in the right foot. No malalignment  observed. IMPRESSION: 1. No acute bony findings. 2. Plantar and Achilles calcaneal spurs. Anchor screw noted in the calcaneus. Electronically Signed   By: Van Clines M.D.   On: 10/01/2021 15:09    Procedures Procedures (including critical care time)  Medications Ordered in UC Medications - No data to display  Initial Impression / Assessment and Plan / UC Course  I have reviewed the triage vital signs and the nursing notes.  Pertinent labs & imaging results that were available during my care of the patient were reviewed by me and considered in my medical decision making (see chart for details).     Patient has a contusion to her foot with persistent swelling.  No bony abnormalities.  She states it is not painful.  She does have an appointment with Dr. Dianah Field for next week.  We will add an Ace wrap.  Elevation. Final Clinical Impressions(s) / UC Diagnoses   Final diagnoses:  Contusion of right foot, initial encounter  Swelling of right foot     Discharge Instructions  Wear Ace wrap and elevate when foot is swollen Limit walking and exercise while foot is swollen and painful See Dr. Dianah Field in follow-up   ED Prescriptions   None    PDMP not reviewed this encounter.   Raylene Everts, MD 10/01/21 1524

## 2021-10-08 ENCOUNTER — Encounter: Payer: No Typology Code available for payment source | Admitting: Sports Medicine

## 2021-10-27 ENCOUNTER — Other Ambulatory Visit: Payer: Self-pay | Admitting: Family Medicine

## 2021-10-27 DIAGNOSIS — E039 Hypothyroidism, unspecified: Secondary | ICD-10-CM

## 2021-11-05 DIAGNOSIS — H524 Presbyopia: Secondary | ICD-10-CM | POA: Diagnosis not present

## 2021-11-05 DIAGNOSIS — Z961 Presence of intraocular lens: Secondary | ICD-10-CM | POA: Diagnosis not present

## 2021-11-05 DIAGNOSIS — H43393 Other vitreous opacities, bilateral: Secondary | ICD-10-CM | POA: Diagnosis not present

## 2021-11-05 DIAGNOSIS — H52223 Regular astigmatism, bilateral: Secondary | ICD-10-CM | POA: Diagnosis not present

## 2021-11-14 ENCOUNTER — Ambulatory Visit (INDEPENDENT_AMBULATORY_CARE_PROVIDER_SITE_OTHER): Payer: No Typology Code available for payment source | Admitting: Family Medicine

## 2021-11-14 ENCOUNTER — Encounter: Payer: Self-pay | Admitting: Family Medicine

## 2021-11-14 VITALS — BP 129/73 | HR 79 | Ht 65.0 in | Wt 167.0 lb

## 2021-11-14 DIAGNOSIS — M5412 Radiculopathy, cervical region: Secondary | ICD-10-CM

## 2021-11-14 DIAGNOSIS — J449 Chronic obstructive pulmonary disease, unspecified: Secondary | ICD-10-CM | POA: Diagnosis not present

## 2021-11-14 DIAGNOSIS — G2581 Restless legs syndrome: Secondary | ICD-10-CM | POA: Diagnosis not present

## 2021-11-14 DIAGNOSIS — R011 Cardiac murmur, unspecified: Secondary | ICD-10-CM

## 2021-11-14 MED ORDER — PREDNISONE 50 MG PO TABS: ORAL_TABLET | ORAL | 0 refills | Status: DC

## 2021-11-14 MED ORDER — AMOXICILLIN-POT CLAVULANATE 875-125 MG PO TABS: 1.0000 | ORAL_TABLET | Freq: Two times a day (BID) | ORAL | 0 refills | Status: DC

## 2021-11-14 NOTE — Patient Instructions (Signed)
Start prednisone and antibiotic (augmentin).  I have ordered an echocardiogram  of your hear to evaluate your murmur.  You will be contacted to set up physical therapy for the neck and shoulder.

## 2021-11-16 DIAGNOSIS — R011 Cardiac murmur, unspecified: Secondary | ICD-10-CM | POA: Insufficient documentation

## 2021-11-16 DIAGNOSIS — M5412 Radiculopathy, cervical region: Secondary | ICD-10-CM | POA: Insufficient documentation

## 2021-11-16 NOTE — Assessment & Plan Note (Signed)
She has seen neurology.  Started on Lyrica which seems to be quite helpful.  She will continue this in addition to the ropinirole.

## 2021-11-16 NOTE — Progress Notes (Signed)
Carolyn [name] - 71 y.o. female MRN [id number]  Date of birth: [birth year]  Subjective Chief Complaint  Patient presents with   thumb swelling   Shoulder Pain   Weight Loss    HPI Carolyn [name] is a 71 year old female here today for follow-up visit.  Reports she has had a little more congestion and wheezing recently.  Sputum is increased and thickened some.  She does have history of COPD.  She is using prescribed inhalers.  She does get a little short of breath with exertion.  She denies chest pain, fever or chills.  Seen by neurology since last visit.  Started on Lyrica in addition to ropinirole for restless legs.  She reports that this is working very well for her.  No significant side effects with this.  Also started on iron supplement due to iron deficiency.  She is having some left shoulder pain.  Pain radiates from her neck down to her shoulder with some occasional pain in her hands.  Denies weakness of the arm or hand.  She does get occasional tingling.  ROS:  A comprehensive ROS was completed and negative except as noted per HPI    Allergies  Allergen Reactions   Trazodone And Nefazodone Shortness Of Breath   Azithromycin Rash    Past Medical History:  Diagnosis Date   BCC (basal cell carcinoma) 08/05/2015   left neck   BCC (basal cell carcinoma) infilt 02/20/2016   right ala nasal   BCC (basal cell carcinoma) sup&nod 06/03/2016   left lower sternum   BCC (basal cell carcinoma) ulcerated 03/19/2016   mid chest between breasts   Cervical cancer ([place])    Depression    Melanoma ([place])    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[place]) 07/05/2014   right shoulder blade   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   left nare   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   left upper shin   SCC (squamous cell carcinoma) in situ 03/19/2016   right pinky   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   right  thigh, left upper shin   SCC (squamous cell carcinoma) well diff 12/13/2008   left hand   SCC (squamous cell carcinoma) well diff x2 12/25/2015   left and right forearm   Thyroid disease     Past Surgical History:  Procedure Laterality Date   CERVICAL CONIZATION W/BX     EYE SURGERY Bilateral    Cataracts   MELANOMA EXCISION  08/2014   back   SHOULDER SURGERY Right     Social History   Socioeconomic History   Marital status: Divorced    Spouse name: Not on file   Number of children: 2   Years of education: Not on file   Highest education level: GED or equivalent  Occupational History   Occupation: disabled  Tobacco Use   Smoking status: Every Day    Packs/day: 0.50    Types: Cigarettes    Start date: 04/06/1968    Passive exposure: Never   Smokeless tobacco: Never  Vaping Use   Vaping Use: Never used  Substance and Sexual Activity   Alcohol use: Not Currently   Drug use: No   Sexual activity: Not on file  Other Topics Concern   Not on file  Social History Narrative   Lives alone   R handed   Caffeine: half a litter of soda  in a day.    Social Determinants of Health   Financial Resource Strain: Low Risk  (05/12/2018)   Overall Financial Resource Strain (CARDIA)    Difficulty of Paying Living Expenses: Not hard at all  Food Insecurity: No Food Insecurity (05/12/2018)   Hunger Vital Sign    Worried About Running Out of Food in the Last Year: Never true    Ran Out of Food in the Last Year: Never true  Transportation Needs: No Transportation Needs (05/12/2018)   PRAPARE - Hydrologist (Medical): No    Lack of Transportation (Non-Medical): No  Physical Activity: Inactive (05/12/2018)   Exercise Vital Sign    Days of Exercise per Week: 0 days    Minutes of Exercise per Session: 0 min  Stress: No Stress Concern Present (05/12/2018)   [place]    Feeling of Stress : Not at all   Social Connections: Somewhat Isolated (05/12/2018)   Social Connection and Isolation Panel [NHANES]    Frequency of Communication with Friends and Family: More than three times a week    Frequency of Social Gatherings with Friends and Family: More than three times a week    Attends Religious Services: More than 4 times per year    Active Member of [employer] or Organizations: No    Attends Archivist Meetings: Never    Marital Status: Divorced    Family History  Problem Relation Age of Onset   Heart disease Mother    Aneurysm Mother    Heart disease Father    Kidney failure Father    Cancer Sister    Cancer Brother    Cirrhosis Daughter    Cancer Sister        Bronchial    Health Maintenance  Topic Date Due   COVID-19 Vaccine (6 - [place] series) 04/06/2022 (Originally 03/18/2021)   INFLUENZA VACCINE  07/05/2022 (Originally 11/04/2021)   Pneumonia Vaccine 9+ Years old (3 - PPSV23 or PCV20) 07/16/2022 (Originally 01/21/2019)   MAMMOGRAM  01/18/2023   COLONOSCOPY (Pts 45-80yr Insurance coverage will need to be confirmed)  09/10/2025   TETANUS/TDAP  01/04/2031   DEXA SCAN  Completed   Hepatitis C Screening  Completed   Zoster Vaccines- Shingrix  Completed   HPV VACCINES  Aged Out     ----------------------------------------------------------------------------------------------------------------------------------------------------------------------------------------------------------------- Physical Exam BP 129/73 (BP Location: Left Arm, Patient Position: Sitting, Cuff Size: Large)   Pulse 79   Ht '5\' 5"'$  (1.651 m)   Wt 167 lb (75.8 kg)   SpO2 92%   BMI 27.79 kg/m   Physical Exam Constitutional:      Appearance: Normal appearance.  Eyes:     General: No scleral icterus. Cardiovascular:     Rate and Rhythm: Normal rate and regular rhythm.  Pulmonary:     Effort: Pulmonary effort is normal.     Breath sounds: Wheezing present.  Musculoskeletal:     Cervical  back: Neck supple.     Comments: 33-2/6systolic murmur.  Neurological:     Mental Status: She is alert.  Psychiatric:        Mood and Affect: Mood normal.        Behavior: Behavior normal.     ------------------------------------------------------------------------------------------------------------------------------------------------------------------------------------------------------------------- Assessment and Plan  COPD (chronic obstructive pulmonary disease) ([place] COPD with current exacerbation.  Adding steroid burst as well as Augmentin.  Smoking cessation advised.  Continue current inhalers.  Cervical radiculitis Her shoulder pain  seems to be related to cervical radiculitis.  I think prednisone burst that I am adding for her COPD exacerbation may also be helpful for this.  Referral to PT made.  Restless leg syndrome She has seen neurology.  Started on Lyrica which seems to be quite helpful.  She will continue this in addition to the ropinirole.   Meds ordered this encounter  Medications   amoxicillin-clavulanate (AUGMENTIN) 875-125 MG tablet    Sig: Take 1 tablet by mouth 2 (two) times daily.    Dispense:  20 tablet    Refill:  0   predniSONE (DELTASONE) 50 MG tablet    Sig: Take '50mg'$  daily x5 days.    Dispense:  5 tablet    Refill:  0    Return in about 6 months (around 05/17/2022) for Hypothyroid/Iron deficiency.    This visit occurred during the SARS-CoV-2 public health emergency.  Safety protocols were in place, including screening questions prior to the visit, additional usage of staff PPE, and extensive cleaning of exam room while observing appropriate contact time as indicated for disinfecting solutions.  Medicine time it was less than 10 seconds

## 2021-11-16 NOTE — Assessment & Plan Note (Signed)
Her shoulder pain seems to be related to cervical radiculitis.  I think prednisone burst that I am adding for her COPD exacerbation may also be helpful for this.  Referral to PT made.

## 2021-11-16 NOTE — Assessment & Plan Note (Signed)
COPD with current exacerbation.  Adding steroid burst as well as Augmentin.  Smoking cessation advised.  Continue current inhalers.

## 2021-11-24 ENCOUNTER — Ambulatory Visit
Payer: No Typology Code available for payment source | Attending: Family Medicine | Admitting: Rehabilitative and Restorative Service Providers"

## 2021-11-24 ENCOUNTER — Other Ambulatory Visit: Payer: Self-pay | Admitting: Psychiatry

## 2021-11-24 ENCOUNTER — Other Ambulatory Visit: Payer: Self-pay

## 2021-11-24 DIAGNOSIS — G8929 Other chronic pain: Secondary | ICD-10-CM | POA: Diagnosis not present

## 2021-11-24 DIAGNOSIS — R293 Abnormal posture: Secondary | ICD-10-CM | POA: Insufficient documentation

## 2021-11-24 DIAGNOSIS — M542 Cervicalgia: Secondary | ICD-10-CM | POA: Diagnosis not present

## 2021-11-24 DIAGNOSIS — R29898 Other symptoms and signs involving the musculoskeletal system: Secondary | ICD-10-CM | POA: Insufficient documentation

## 2021-11-24 DIAGNOSIS — M5412 Radiculopathy, cervical region: Secondary | ICD-10-CM | POA: Insufficient documentation

## 2021-11-24 DIAGNOSIS — M25512 Pain in left shoulder: Secondary | ICD-10-CM | POA: Diagnosis not present

## 2021-11-24 NOTE — Therapy (Signed)
OUTPATIENT PHYSICAL THERAPY CERVICAL EVALUATION   Patient Name: Carolyn [name] MRN: [id number] DOB:02-07-1951, 71 y.o., female Today's Date: 11/24/2021   PT End of Session - 11/24/21 1642     Visit Number 1    Number of Visits 16    Date for PT Re-Evaluation 01/19/22    Progress Note Due on Visit 10    PT Start Time 6759    PT Stop Time 1638    PT Time Calculation (min) 45 min    Activity Tolerance Patient tolerated treatment well             Past Medical History:  Diagnosis Date   BCC (basal cell carcinoma) 08/05/2015   left neck   BCC (basal cell carcinoma) infilt 02/20/2016   right ala nasal   BCC (basal cell carcinoma) sup&nod 06/03/2016   left lower sternum   BCC (basal cell carcinoma) ulcerated 03/19/2016   mid chest between breasts   Cervical cancer ([place])    Depression    Melanoma (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   left nare   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   left upper shin   SCC (squamous cell carcinoma) in situ 03/19/2016   right pinky   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   right thigh, left upper shin   SCC (squamous cell carcinoma) well diff 12/13/2008   left hand   SCC (squamous cell carcinoma) well diff x2 12/25/2015   left and right forearm   Thyroid disease    Past Surgical History:  Procedure Laterality Date   CERVICAL CONIZATION W/BX     EYE SURGERY Bilateral    Cataracts   MELANOMA EXCISION  08/2014   back   SHOULDER SURGERY Right    Patient Active Problem List   Diagnosis Date Noted   Systolic murmur 46/65/9935   Cervical radiculitis 11/16/2021   Hearing loss due to cerumen impaction, right 06/23/2021   Insomnia 04/08/2021   Tobacco use disorder 02/10/2017   Peripheral neuropathic pain 08/04/2016   Pulmonary nodule 07/15/2016    Constipation 04/13/2016   Aortic atherosclerosis ([place]) 01/13/2016   COPD (chronic obstructive pulmonary disease) ([place]) 01/13/2016   Depression 07/11/2015   GAD (generalized anxiety disorder) 07/11/2015   Allergic rhinitis 07/11/2015   Hypothyroidism 07/11/2015   Restless leg syndrome 07/11/2015   ASCUS with positive high risk HPV 03/04/2015   Atypical glandular cells of undetermined significance (AGUS) on cervical Pap smear 11/06/2013   Postmenopausal state 10/09/2013   History of cervical dysplasia 10/09/2013    PCP: Dr [name]  REFERRING PROVIDER: Dr [name]  REFERRING DIAG: Cervical radiculitis; Lt shoulder pain   THERAPY DIAG:  Cervicalgia  Chronic left shoulder pain  Other symptoms and signs involving the musculoskeletal system  Abnormal posture  Rationale for Evaluation and Treatment Rehabilitation  ONSET DATE: 11/04/20  SUBJECTIVE:  SUBJECTIVE STATEMENT: Patient reports that she started having pain in the neck and Lt shoulder over a year ago with no known injury. She has pain in the neck and into the shoulders Lt > Rt. She has pain in the Lt neck with pain into the Lt ear. It is difficult to dress and undress   PERTINENT HISTORY:  Rt shoulder sx x 2; Lt shoulder sx x 1 ~ 10 yrs ago; sx on Rt thumb; Rt ankle for torn tendon  PAIN:  Are you having pain? Yes: NPRS scale: 5/10 Pain location: bilat neck; Lt > Rt shoulders; Lt ear Pain description: sharp; throbbing in ear Aggravating factors: strain; lifting; reaching; using arms; especially lifting overhead Relieving factors: pain patch; meds  PRECAUTIONS: None  WEIGHT BEARING RESTRICTIONS No  FALLS:  Has patient fallen in last 6 months? 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 - working PT ~ 1 year  Household chores; yard work; gardening  PLOF: Independent  PATIENT GOALS get rid of the neck and shoulder pain  OBJECTIVE:   DIAGNOSTIC FINDINGS:  No x-rays or diagnostic tests   PATIENT SURVEYS:  FOTO 49   COGNITION: Overall cognitive status: Within functional limits for tasks assessed   SENSATION: Numbness and tingling in the thumb and index fingers on an intermittent basis which is worse when she is having increased neck pain   POSTURE: Head forward; shoulders rounded and elevated; head of the humerus anterior in orientation; increased thoracic kyphosis; scapulae abducted and rotated along the thoracic wall   PALPATION: Muscular tightness through ant/lat/posterior cervical musculature; pecs; upper traps; leveator Lt > Rt    CERVICAL ROM:  Tightness in cervical spine with all motions   Active ROM A/PROM (deg) eval  Flexion 51  Extension 33  Right lateral flexion 29  Left lateral flexion 27  Right rotation 55  Left rotation 63   (Blank rows = not tested)  UPPER EXTREMITY ROM:  Active ROM Right eval Left eval  Shoulder flexion 140 143  Shoulder extension 30 35  Shoulder abduction 130 135  Shoulder adduction    Shoulder extension    Shoulder internal rotation    Shoulder external rotation    Elbow flexion    Elbow extension    Wrist flexion    Wrist extension    Wrist ulnar deviation    Wrist radial deviation    Wrist pronation    Wrist supination     (Blank rows = not tested)  UPPER EXTREMITY MMT:  MMT Right eval Left eval  Shoulder flexion 4/5 4+/5  Shoulder extension 4+/5 5/5  Shoulder abduction 4/5 4+/5  Shoulder adduction    Shoulder extension     Shoulder internal rotation    Shoulder external rotation    Middle trapezius    Lower trapezius    Elbow flexion    Elbow extension    Wrist flexion    Wrist extension    Wrist ulnar deviation    Wrist radial deviation    Wrist pronation    Wrist supination    Grip strength     (Blank rows = not tested)    TODAY'S TREATMENT:  Education re musculoskeletal dysfunction and the chronicity  of musculoskeletal pain; postural correction;  therapeutic exercises with noodle along spine:  Chin tuck 10 sec x 10; Scap squeez 10 sec x 10; L's x 10; W's x 10  Myofacial ball release thoracic spine and through pecs     PATIENT EDUCATION:  Education details: POC; postural correction; HEP; myofacial ball release; DN Person educated: Patient Education method: Explanation, Demonstration, Tactile cues, Verbal cues, and Handouts Education comprehension: verbalized understanding, returned demonstration, verbal cues required, tactile cues required, and needs further education   HOME EXERCISE PROGRAM: Access Code: [email or web address] URL: [email or web address] Date: 11/24/2021 Prepared by: Gillermo Murdoch  Exercises - Seated Scapular Retraction  - 2 x daily - 7 x weekly - 1-2 sets - 10 reps - 10 sec  hold - Seated Cervical Retraction  - 2 x daily - 7 x weekly - 1-2 sets - 5-10 reps - 10 sec  hold - Shoulder External Rotation and Scapular Retraction  - 3 x daily - 7 x weekly - 1 sets - 10 reps - 3-5 sec   hold - Shoulder External Rotation in 45 Degrees Abduction  - 2 x daily - 7 x weekly - 1-2 sets - 10 reps - 3 sec  hold - Seated Cervical Sidebending AROM  - 2 x daily - 7 x weekly - 1 sets - 5 reps - 10 sec  hold  Patient Education - Trigger Point Dry Needling  ASSESSMENT:  CLINICAL IMPRESSION: Patient is a 71 y.o. female who was seen today for physical therapy evaluation and treatment for chronic cervical and Lt > Rt shoulder dysfunction. She has no known injury but has undergone 3  shoulder surgeries. She has poor posture and alignment; limited cervical and shoulder ROM/mobility; muscular tightness to palpation through the ant/lat/post cervical musculature, pecs, upper traps. Patient will benefit from PT to address problems identified.     OBJECTIVE IMPAIRMENTS decreased ROM, decreased strength, hypomobility, increased fascial restrictions, increased muscle spasms, impaired flexibility, impaired UE functional use, improper body mechanics, postural dysfunction, and pain.   ACTIVITY LIMITATIONS carrying, lifting, bending, sitting, standing, sleeping, dressing, reach over head, and caring for others  PARTICIPATION LIMITATIONS: cleaning, laundry, yard work, and church  PERSONAL FACTORS Age, Education, Past/current experiences, Time since onset of injury/illness/exacerbation, and 1 comorbidity: multiple surgeries bilat shoulders  are also affecting patient's functional outcome.   REHAB POTENTIAL: Good  CLINICAL DECISION MAKING: Stable/uncomplicated  EVALUATION COMPLEXITY: Low   GOALS: Goals reviewed with patient? Yes   LONG TERM GOALS: Target date: 01/19/2022  Decrease frequency, intensity, and/or duration of pain by 50-75% allowing patient to use UE's for functional activities and rest  Baseline:  Goal status:   2.  Increase cervical mobility by 3-5 degrees in all planes  Baseline:  Goal status: INITIAL  3.  Improve posture and alignment with patient to demonstrate improved upright posture with posterior shoulder girdle engaged Baseline:  Goal status: INITIAL  4.  Independent in HEP  Baseline:  Goal status: INITIAL  5.  Improve functional limitation score to 57 Baseline:  Goal status: INITIAL   PLAN: PT FREQUENCY: 2x/week  PT DURATION: 8 weeks  PLANNED INTERVENTIONS: Therapeutic exercises, Therapeutic activity, Neuromuscular re-education, Patient/Family education, Self Care, Joint mobilization, Aquatic Therapy, Dry Needling, Electrical stimulation,  Cryotherapy, Moist heat, Taping, Ultrasound, Manual therapy, and Re-evaluation  PLAN FOR NEXT SESSION: Review HEP; continue with postural correction; add pec stretching as tolerated; myofacial ball release work; trial of manual work and DN; Modalities as indicated (pt has TENS unit at home which  she will try for cervical pain)  Mychal Durio Nilda Simmer, PT, MPH  11/24/2021, 4:43 PM

## 2021-11-26 ENCOUNTER — Encounter: Payer: Self-pay | Admitting: Rehabilitative and Restorative Service Providers"

## 2021-11-26 ENCOUNTER — Ambulatory Visit: Payer: No Typology Code available for payment source | Admitting: Rehabilitative and Restorative Service Providers"

## 2021-11-26 DIAGNOSIS — G8929 Other chronic pain: Secondary | ICD-10-CM

## 2021-11-26 DIAGNOSIS — M542 Cervicalgia: Secondary | ICD-10-CM | POA: Diagnosis not present

## 2021-11-26 DIAGNOSIS — R29898 Other symptoms and signs involving the musculoskeletal system: Secondary | ICD-10-CM

## 2021-11-26 DIAGNOSIS — R293 Abnormal posture: Secondary | ICD-10-CM

## 2021-11-26 NOTE — Therapy (Signed)
OUTPATIENT PHYSICAL THERAPY CERVICAL EVALUATION   Patient Name: Carolyn [name] MRN: [id number] DOB:Jun 21, 1950, 71 y.o., female Today's Date: 11/26/2021   PT End of Session - 11/26/21 0759     Visit Number 2    Number of Visits 16    Date for PT Re-Evaluation 01/19/22    Progress Note Due on Visit 10    PT Start Time 8828    PT Stop Time 0848    PT Time Calculation (min) 49 min    Activity Tolerance Patient tolerated treatment well             Past Medical History:  Diagnosis Date   BCC (basal cell carcinoma) 08/05/2015   left neck   BCC (basal cell carcinoma) infilt 02/20/2016   right ala nasal   BCC (basal cell carcinoma) sup&nod 06/03/2016   left lower sternum   BCC (basal cell carcinoma) ulcerated 03/19/2016   mid chest between breasts   Cervical cancer ([place])    Depression    Melanoma ([place])    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[place]) 07/05/2014   right shoulder blade   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   left nare   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   left upper shin   SCC (squamous cell carcinoma) in situ 03/19/2016   right pinky   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   right thigh, left upper shin   SCC (squamous cell carcinoma) well diff 12/13/2008   left hand   SCC (squamous cell carcinoma) well diff x2 12/25/2015   left and right forearm   Thyroid disease    Past Surgical History:  Procedure Laterality Date   CERVICAL CONIZATION W/BX     EYE SURGERY Bilateral    Cataracts   MELANOMA EXCISION  08/2014   back   SHOULDER SURGERY Right    Patient Active Problem List   Diagnosis Date Noted   Systolic murmur 00/34/9179   Cervical radiculitis 11/16/2021   Hearing loss due to cerumen impaction, right 06/23/2021   Insomnia 04/08/2021   Tobacco use disorder 02/10/2017   Peripheral neuropathic pain 08/04/2016   Pulmonary nodule 07/15/2016    Constipation 04/13/2016   Aortic atherosclerosis ([place]) 01/13/2016   COPD (chronic obstructive pulmonary disease) ([place]) 01/13/2016   Depression 07/11/2015   GAD (generalized anxiety disorder) 07/11/2015   Allergic rhinitis 07/11/2015   Hypothyroidism 07/11/2015   Restless leg syndrome 07/11/2015   ASCUS with positive high risk HPV 03/04/2015   Atypical glandular cells of undetermined significance (AGUS) on cervical Pap smear 11/06/2013   Postmenopausal state 10/09/2013   History of cervical dysplasia 10/09/2013    PCP: Dr [name]  REFERRING PROVIDER: Dr [name]  REFERRING DIAG: Cervical radiculitis; Lt shoulder pain   THERAPY DIAG:  Cervicalgia  Chronic left shoulder pain  Other symptoms and signs involving the musculoskeletal system  Abnormal posture  Rationale for Evaluation and Treatment Rehabilitation  ONSET DATE: 11/04/20  SUBJECTIVE:  SUBJECTIVE STATEMENT:  11/25/21:   Patient reports that she worked hard yesterday and her neck and shoulders are hurting worse today. She did use her TENS unit and that helped "really good"    Eval: Patient reports that she started having pain in the neck and Lt shoulder over a year ago with no known injury. She has pain in the neck and into the shoulders Lt > Rt. She has pain in the Lt neck with pain into the Lt ear. It is difficult to dress and undress   PERTINENT HISTORY:  Rt shoulder sx x 2; Lt shoulder sx x 1 ~ 10 yrs ago; sx on Rt thumb; Rt ankle for torn tendon  PAIN:  11/26/21:  Are you having pain? Yes: NPRS scale: 7/10 Pain location: bilat neck; Lt > Rt shoulders; Lt ear Pain description: sharp; throbbing in ear Aggravating factors: strain; lifting; reaching; using arms; especially lifting overhead Relieving factors:  pain patch; meds  PRECAUTIONS: None  WEIGHT BEARING RESTRICTIONS No  FALLS:  Has patient fallen in last 6 months? 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 - working PT ~ 1 year  Household chores; yard work; gardening  PLOF: Independent  PATIENT GOALS get rid of the neck and shoulder pain  OBJECTIVE:   DIAGNOSTIC FINDINGS:  No x-rays or diagnostic tests   PATIENT SURVEYS:  FOTO 49   COGNITION: Overall cognitive status: Within functional limits for tasks assessed   SENSATION: Numbness and tingling in the thumb and index fingers on an intermittent basis which is worse when she is having increased neck pain   POSTURE: Head forward; shoulders rounded and elevated; head of the humerus anterior in orientation; increased thoracic kyphosis; scapulae abducted and rotated along the thoracic wall   PALPATION: Muscular tightness through ant/lat/posterior cervical musculature; pecs; upper traps; leveator Lt > Rt    CERVICAL ROM:  Tightness in cervical spine with all motions   Active ROM A/PROM (deg) eval  Flexion 51  Extension 33  Right lateral flexion 29  Left lateral flexion 27  Right rotation 55  Left rotation 63   (Blank rows = not tested)  UPPER EXTREMITY ROM:  Active ROM Right eval Left eval  Shoulder flexion 140 143  Shoulder extension 30 35  Shoulder abduction 130 135  Shoulder adduction    Shoulder extension    Shoulder internal rotation    Shoulder external rotation    Elbow flexion    Elbow extension    Wrist flexion    Wrist extension    Wrist ulnar deviation    Wrist radial deviation    Wrist pronation    Wrist supination     (Blank rows = not  tested)  UPPER EXTREMITY MMT:  MMT Right eval Left eval  Shoulder flexion 4/5 4+/5  Shoulder extension 4+/5 5/5  Shoulder abduction 4/5 4+/5  Shoulder adduction    Shoulder extension    Shoulder internal rotation    Shoulder external rotation    Middle trapezius    Lower trapezius    Elbow flexion    Elbow extension    Wrist flexion    Wrist extension    Wrist ulnar deviation    Wrist radial deviation  Wrist pronation    Wrist supination    Grip strength     (Blank rows = not tested)    TODAY'S TREATMENT:  Education re musculoskeletal dysfunction and the chronicity of musculoskeletal pain; postural correction;  therapeutic exercises with noodle along spine:  Chin tuck 10 sec x 10; Scap squeez 10 sec x 10; L's x 10; W's x 10 Doorway stretch 2 lower positions 20 sec x 3 reps  Prone scap squeeze 5 sec x 10  Prone scap squeeze with axial extension 5 sec x 5   Myofacial ball release thoracic spine and through pecs   Trigger Point Dry-Needling  Treatment instructions: Expect mild to moderate muscle soreness. S/S of pneumothorax if dry needled over a lung field, and to seek immediate medical attention should they occur. Patient verbalized understanding of these instructions and education.  Patient Consent Given: Yes Education handout provided: Yes Muscles treated: occipital; cervical paraspinals; scaleni; thoracic paraspinals; upper traps bilat  Electrical stimulation performed: No Parameters: N/A Treatment response/outcome: decreased palpable tightness    PATIENT EDUCATION:  Education details: POC; postural correction; HEP; myofacial ball release; DN Person educated: Patient Education method: Explanation, Demonstration, Tactile cues, Verbal cues, and Handouts Education comprehension: verbalized understanding, returned demonstration, verbal cues required, tactile cues required, and needs further education   HOME EXERCISE PROGRAM: Access Code: [email or web address] URL:  [email or web address] Date: 11/26/2021 Prepared by: Gillermo Murdoch  Exercises - Seated Scapular Retraction  - 2 x daily - 7 x weekly - 1-2 sets - 10 reps - 10 sec  hold - Seated Cervical Retraction  - 2 x daily - 7 x weekly - 1-2 sets - 5-10 reps - 10 sec  hold - Shoulder External Rotation and Scapular Retraction  - 3 x daily - 7 x weekly - 1 sets - 10 reps - 3-5 sec   hold - Shoulder External Rotation in 45 Degrees Abduction  - 2 x daily - 7 x weekly - 1-2 sets - 10 reps - 3 sec  hold - Seated Cervical Sidebending AROM  - 2 x daily - 7 x weekly - 1 sets - 5 reps - 10 sec  hold - Doorway Pec Stretch at 60 Degrees Abduction  - 3 x daily - 7 x weekly - 1 sets - 3 reps - Doorway Pec Stretch at 90 Degrees Abduction  - 3 x daily - 7 x weekly - 1 sets - 3 reps - 30 seconds  hold - Prone Scapular Retraction  - 2 x daily - 7 x weekly - 1 sets - 5-10 reps - 3-5 sec  hold  Patient Education - Trigger Point Dry Needling   ASSESSMENT:  CLINICAL IMPRESSION:  11/26/21:  Patient reports increased pain with work tasks. She has not worked on exercises consistently due to increased pain. Patient reports good release of tightness post treatment. Tolerated DN and manual work well. Encouraged patient to work on exercises consistently.   Eval:  Patient is a 71 y.o. female who was seen today for physical therapy evaluation and treatment for chronic cervical and Lt > Rt shoulder dysfunction. She has no known injury but has undergone 3 shoulder surgeries. She has poor posture and alignment; limited cervical and shoulder ROM/mobility; muscular tightness to palpation through the ant/lat/post cervical musculature, pecs, upper traps. Patient will benefit from PT to address problems identified.     OBJECTIVE IMPAIRMENTS decreased ROM, decreased strength, hypomobility, increased fascial restrictions, increased muscle spasms, impaired flexibility, impaired UE functional use, improper body mechanics, postural  dysfunction, and pain.   ACTIVITY LIMITATIONS carrying, lifting, bending, sitting, standing, sleeping, dressing, reach over head, and caring for others  PARTICIPATION LIMITATIONS: cleaning, laundry, yard work, and church  PERSONAL FACTORS Age, Education, Past/current experiences, Time since onset of injury/illness/exacerbation, and 1 comorbidity: multiple surgeries bilat shoulders  are also affecting patient's functional outcome.   REHAB POTENTIAL: Good  CLINICAL DECISION MAKING: Stable/uncomplicated  EVALUATION COMPLEXITY: Low   GOALS: Goals reviewed with patient? Yes   LONG TERM GOALS: Target date: 01/21/2022  Decrease frequency, intensity, and/or duration of pain by 50-75% allowing patient to use UE's for functional activities and rest  Baseline:  Goal status:   2.  Increase cervical mobility by 3-5 degrees in all planes  Baseline:  Goal status: INITIAL  3.  Improve posture and alignment with patient to demonstrate improved upright posture with posterior shoulder girdle engaged Baseline:  Goal status: INITIAL  4.  Independent in HEP  Baseline:  Goal status: INITIAL  5.  Improve functional limitation score to 57 Baseline:  Goal status: INITIAL   PLAN: PT FREQUENCY: 2x/week  PT DURATION: 8 weeks  PLANNED INTERVENTIONS: Therapeutic exercises, Therapeutic activity, Neuromuscular re-education, Patient/Family education, Self Care, Joint mobilization, Aquatic Therapy, Dry Needling, Electrical stimulation, Cryotherapy, Moist heat, Taping, Ultrasound, Manual therapy, and Re-evaluation  PLAN FOR NEXT SESSION: Review HEP; continue with postural correction; add pec stretching as tolerated; myofacial ball release work; trial of manual work and DN; Modalities as indicated (pt has TENS unit at home which she will try for cervical pain)  Travious Vanover Nilda Simmer, PT, MPH  11/26/2021, 8:46 AM

## 2021-12-01 ENCOUNTER — Ambulatory Visit: Payer: No Typology Code available for payment source | Admitting: Rehabilitative and Restorative Service Providers"

## 2021-12-01 ENCOUNTER — Encounter: Payer: Self-pay | Admitting: Rehabilitative and Restorative Service Providers"

## 2021-12-01 DIAGNOSIS — R29898 Other symptoms and signs involving the musculoskeletal system: Secondary | ICD-10-CM

## 2021-12-01 DIAGNOSIS — G8929 Other chronic pain: Secondary | ICD-10-CM

## 2021-12-01 DIAGNOSIS — M542 Cervicalgia: Secondary | ICD-10-CM | POA: Diagnosis not present

## 2021-12-01 DIAGNOSIS — R293 Abnormal posture: Secondary | ICD-10-CM

## 2021-12-01 NOTE — Therapy (Signed)
OUTPATIENT PHYSICAL THERAPY CERVICAL TREATMENT   Patient Name: Sarabeth Benton MRN: [id number] DOB:1951-03-01, 71 y.o., female Today's Date: 12/01/2021   PT End of Session - 12/01/21 1517     Visit Number 3    Number of Visits 16    Date for PT Re-Evaluation 01/19/22    Progress Note Due on Visit 10    PT Start Time 3568    PT Stop Time 6168    PT Time Calculation (min) 52 min    Activity Tolerance Patient tolerated treatment well             Past Medical History:  Diagnosis Date   BCC (basal cell carcinoma) 08/05/2015   left neck   BCC (basal cell carcinoma) infilt 02/20/2016   right ala nasal   BCC (basal cell carcinoma) sup&nod 06/03/2016   left lower sternum   BCC (basal cell carcinoma) ulcerated 03/19/2016   mid chest between breasts   Cervical cancer ([place])    Depression    Melanoma ([place])    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[place]) 07/05/2014   right shoulder blade   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   left nare   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   left upper shin   SCC (squamous cell carcinoma) in situ 03/19/2016   right pinky   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   right thigh, left upper shin   SCC (squamous cell carcinoma) well diff 12/13/2008   left hand   SCC (squamous cell carcinoma) well diff x2 12/25/2015   left and right forearm   Thyroid disease    Past Surgical History:  Procedure Laterality Date   CERVICAL CONIZATION W/BX     EYE SURGERY Bilateral    Cataracts   MELANOMA EXCISION  08/2014   back   SHOULDER SURGERY Right    Patient Active Problem List   Diagnosis Date Noted   Systolic murmur 37/29/0211   Cervical radiculitis 11/16/2021   Hearing loss due to cerumen impaction, right 06/23/2021   Insomnia 04/08/2021   Tobacco use disorder 02/10/2017   Peripheral neuropathic pain 08/04/2016   Pulmonary nodule 07/15/2016    Constipation 04/13/2016   Aortic atherosclerosis ([place]) 01/13/2016   COPD (chronic obstructive pulmonary disease) ([place]) 01/13/2016   Depression 07/11/2015   GAD (generalized anxiety disorder) 07/11/2015   Allergic rhinitis 07/11/2015   Hypothyroidism 07/11/2015   Restless leg syndrome 07/11/2015   ASCUS with positive high risk HPV 03/04/2015   Atypical glandular cells of undetermined significance (AGUS) on cervical Pap smear 11/06/2013   Postmenopausal state 10/09/2013   History of cervical dysplasia 10/09/2013    PCP: Dr [name]  REFERRING PROVIDER: Dr [name]  REFERRING DIAG: Cervical radiculitis; Lt shoulder pain   THERAPY DIAG:  Cervicalgia  Chronic left shoulder pain  Other symptoms and signs involving the musculoskeletal system  Abnormal posture  Rationale for Evaluation and Treatment Rehabilitation  ONSET DATE: 11/04/20  SUBJECTIVE:  SUBJECTIVE STATEMENT:  12/01/21:   Patient reports that she was really sore for the first couple of days after the needling. She is working on her exercises at home. She reports that she can tell a difference in how her neck and shoulders feel. She is '100% better than it was".  She is using her TENS unit some and that helps.    Eval: Patient reports that she started having pain in the neck and Lt shoulder over a year ago with no known injury. She has pain in the neck and into the shoulders Lt > Rt. She has pain in the Lt neck with pain into the Lt ear. It is difficult to dress and undress   PERTINENT HISTORY:  Rt shoulder sx x 2; Lt shoulder sx x 1 ~ 10 yrs ago; sx on Rt thumb; Rt ankle for torn tendon  PAIN:  12/01/21:  Are you having pain? Yes: NPRS scale: 0/10 Pain location: bilat neck; Lt > Rt shoulders; Lt ear Pain  description: sharp; throbbing in ear Aggravating factors: strain; lifting; reaching; using arms; especially lifting overhead Relieving factors: pain patch; meds  PRECAUTIONS: None  WEIGHT BEARING RESTRICTIONS No  FALLS:  Has patient fallen in last 6 months? 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 - working PT ~ 1 year  Household chores; yard work; gardening  PLOF: Independent  PATIENT GOALS get rid of the neck and shoulder pain  OBJECTIVE:   DIAGNOSTIC FINDINGS:  No x-rays or diagnostic tests   PATIENT SURVEYS:  FOTO 49   COGNITION: Overall cognitive status: Within functional limits for tasks assessed   SENSATION: Numbness and tingling in the thumb and index fingers on an intermittent basis which is worse when she is having increased neck pain   POSTURE: Head forward; shoulders rounded and elevated; head of the humerus anterior in orientation; increased thoracic kyphosis; scapulae abducted and rotated along the thoracic wall   PALPATION: Muscular tightness through ant/lat/posterior cervical musculature; pecs; upper traps; leveator Lt > Rt    CERVICAL ROM:  Tightness in cervical spine with all motions   Active ROM A/PROM (deg) eval  Flexion 51  Extension 33  Right lateral flexion 29  Left lateral flexion 27  Right rotation 55  Left rotation 63   (Blank rows = not tested)  UPPER EXTREMITY ROM:  Active ROM Right eval Left eval  Shoulder flexion 140 143  Shoulder extension 30 35  Shoulder abduction 130 135  Shoulder adduction    Shoulder extension    Shoulder internal rotation    Shoulder external rotation    Elbow flexion    Elbow extension    Wrist flexion     Wrist extension    Wrist ulnar deviation    Wrist radial deviation    Wrist pronation    Wrist supination     (Blank rows = not tested)  UPPER EXTREMITY MMT:  MMT Right eval Left eval  Shoulder flexion 4/5 4+/5  Shoulder extension 4+/5 5/5  Shoulder abduction 4/5 4+/5  Shoulder adduction    Shoulder extension    Shoulder internal rotation    Shoulder external rotation    Middle trapezius    Lower trapezius  Elbow flexion    Elbow extension    Wrist flexion    Wrist extension    Wrist ulnar deviation    Wrist radial deviation    Wrist pronation    Wrist supination    Grip strength     (Blank rows = not tested)    TODAY'S TREATMENT:  12/01/21:  Education re musculoskeletal dysfunction and the chronicity of musculoskeletal pain; postural correction and the importance of strengthening neck and shoulders;  therapeutic exercises with noodle along spine:  Chin tuck 10 sec x 10; Scap squeez 10 sec x 10; L's x 10; W's x 10 Doorway stretch 2 lower positions 20-30 sec x 3 reps (modified to stretch with only Lt UE due to pain in the anterior Rt shoulder from old surgery) Doorway stretch higher position 20-30 sec x 3 (tolerated better Rt shoulder in higher position) Row blue TB x 10 Bow and arrow blue TB x 10 Shoulder ER bilat red TB 10 x 2  Shoulder W bilat red TB 10 x 2    Prone scap squeeze with axial extension 5 sec x 10 (Cues for alignment and maintaining chin tuck)   Myofacial ball release thoracic spine and through pecs   Trigger Point Dry-Needling  Treatment instructions: Expect mild to moderate muscle soreness. S/S of pneumothorax if dry needled over a lung field, and to seek immediate medical attention should they occur. Patient verbalized understanding of these instructions and education.  Patient Consent Given: Yes Education handout provided: previously given Muscles treated: occipital; cervical paraspinals; scaleni; upper traps; teres bilat  Electrical  stimulation performed: No Parameters: N/A Treatment response/outcome: decreased palpable tightness    Soft tissue mobilization through the cervical and posterior shoulder girdle musculature   Modalities:   Moist heat cervical and thoracic areas x 10 min  PATIENT EDUCATION:  Education details: POC; postural correction; HEP; myofacial ball release; DN Person educated: Patient Education method: Explanation, Demonstration, Tactile cues, Verbal cues, and Handouts Education comprehension: verbalized understanding, returned demonstration, verbal cues required, tactile cues required, and needs further education   HOME EXERCISE PROGRAM: Access Code: [email or web address] URL: [email or web address] Date: 12/01/2021 Prepared by: Gillermo Murdoch  Exercises - Seated Scapular Retraction  - 2 x daily - 7 x weekly - 1-2 sets - 10 reps - 10 sec  hold - Seated Cervical Retraction  - 2 x daily - 7 x weekly - 1-2 sets - 5-10 reps - 10 sec  hold - Shoulder External Rotation and Scapular Retraction  - 3 x daily - 7 x weekly - 1 sets - 10 reps - 3-5 sec   hold - Shoulder External Rotation in 45 Degrees Abduction  - 2 x daily - 7 x weekly - 1-2 sets - 10 reps - 3 sec  hold - Seated Cervical Sidebending AROM  - 2 x daily - 7 x weekly - 1 sets - 5 reps - 10 sec  hold - Doorway Pec Stretch at 60 Degrees Abduction  - 3 x daily - 7 x weekly - 1 sets - 3 reps - Doorway Pec Stretch at 90 Degrees Abduction  - 3 x daily - 7 x weekly - 1 sets - 3 reps - 30 seconds  hold - Prone Scapular Retraction  - 2 x daily - 7 x weekly - 1 sets - 5-10 reps - 3-5 sec  hold - Standing Bilateral Low Shoulder Row with Anchored Resistance  - 2 x daily - 7 x weekly - 1-3 sets - 10 reps -  2-3 sec  hold - Drawing Bow  - 1 x daily - 7 x weekly - 1 sets - 10 reps - 3 sec  hold - Shoulder External Rotation with Resistance  - 2 x daily - 7 x weekly - 1 sets - 10 reps - 3 sec  hold - Shoulder W - External Rotation with Resistance  - 2 x daily -  7 x weekly - 1-2 sets - 10 reps - 3 sec  hold   ASSESSMENT:  CLINICAL IMPRESSION:  12/01/21:  Patient reports excellent response to DN and manual work - after a day or two of soreness. Pain free in neck and shoulder areas today. [name] needs to work on correction of posture and forward positions at work and home as well as strengthening as tolerated. She has had 3 Rt shoulder surgeries in past - most recent ~ 8-9 years ago. Good tolerance of DN and noted decreased tightness post treatment. Encouraged patient to work on exercises consistently. Issued red and blue TB for HEP.   Eval:  Patient is a 71 y.o. female who was seen today for physical therapy evaluation and treatment for chronic cervical and Lt > Rt shoulder dysfunction. She has no known injury but has undergone 3 shoulder surgeries. She has poor posture and alignment; limited cervical and shoulder ROM/mobility; muscular tightness to palpation through the ant/lat/post cervical musculature, pecs, upper traps. Patient will benefit from PT to address problems identified.     OBJECTIVE IMPAIRMENTS decreased ROM, decreased strength, hypomobility, increased fascial restrictions, increased muscle spasms, impaired flexibility, impaired UE functional use, improper body mechanics, postural dysfunction, and pain.   ACTIVITY LIMITATIONS carrying, lifting, bending, sitting, standing, sleeping, dressing, reach over head, and caring for others  PARTICIPATION LIMITATIONS: cleaning, laundry, yard work, and church  PERSONAL FACTORS Age, Education, Past/current experiences, Time since onset of injury/illness/exacerbation, and 1 comorbidity: multiple surgeries bilat shoulders  are also affecting patient's functional outcome.   REHAB POTENTIAL: Good  CLINICAL DECISION MAKING: Stable/uncomplicated  EVALUATION COMPLEXITY: Low   GOALS: Goals reviewed with patient? Yes   LONG TERM GOALS: Target date: 01/26/2022  Decrease frequency, intensity, and/or  duration of pain by 50-75% allowing patient to use UE's for functional activities and rest  Baseline:  Goal status:   2.  Increase cervical mobility by 3-5 degrees in all planes  Baseline:  Goal status: INITIAL  3.  Improve posture and alignment with patient to demonstrate improved upright posture with posterior shoulder girdle engaged Baseline:  Goal status: INITIAL  4.  Independent in HEP  Baseline:  Goal status: INITIAL  5.  Improve functional limitation score to 57 Baseline:  Goal status: INITIAL   PLAN: PT FREQUENCY: 2x/week  PT DURATION: 8 weeks  PLANNED INTERVENTIONS: Therapeutic exercises, Therapeutic activity, Neuromuscular re-education, Patient/Family education, Self Care, Joint mobilization, Aquatic Therapy, Dry Needling, Electrical stimulation, Cryotherapy, Moist heat, Taping, Ultrasound, Manual therapy, and Re-evaluation  PLAN FOR NEXT SESSION: Review HEP; continue with postural correction; progress with posterior shoulder girdle strengthening; myofacial ball release work; trial of manual work and DN; modalities as indicated (pt has TENS unit at home)  Everardo All, PT, MPH  12/01/2021, 4:34 PM

## 2021-12-02 ENCOUNTER — Other Ambulatory Visit (HOSPITAL_COMMUNITY): Payer: No Typology Code available for payment source

## 2021-12-03 DIAGNOSIS — L821 Other seborrheic keratosis: Secondary | ICD-10-CM | POA: Diagnosis not present

## 2021-12-03 DIAGNOSIS — L578 Other skin changes due to chronic exposure to nonionizing radiation: Secondary | ICD-10-CM | POA: Diagnosis not present

## 2021-12-03 DIAGNOSIS — L570 Actinic keratosis: Secondary | ICD-10-CM | POA: Diagnosis not present

## 2021-12-03 DIAGNOSIS — C44529 Squamous cell carcinoma of skin of other part of trunk: Secondary | ICD-10-CM | POA: Diagnosis not present

## 2021-12-05 ENCOUNTER — Encounter: Payer: Self-pay | Admitting: Physical Therapy

## 2021-12-05 ENCOUNTER — Ambulatory Visit: Payer: No Typology Code available for payment source | Attending: Family Medicine | Admitting: Physical Therapy

## 2021-12-05 DIAGNOSIS — M542 Cervicalgia: Secondary | ICD-10-CM | POA: Diagnosis not present

## 2021-12-05 DIAGNOSIS — M25512 Pain in left shoulder: Secondary | ICD-10-CM | POA: Diagnosis not present

## 2021-12-05 DIAGNOSIS — R293 Abnormal posture: Secondary | ICD-10-CM | POA: Insufficient documentation

## 2021-12-05 DIAGNOSIS — G8929 Other chronic pain: Secondary | ICD-10-CM | POA: Insufficient documentation

## 2021-12-05 DIAGNOSIS — R29898 Other symptoms and signs involving the musculoskeletal system: Secondary | ICD-10-CM | POA: Insufficient documentation

## 2021-12-05 NOTE — Therapy (Signed)
OUTPATIENT PHYSICAL THERAPY CERVICAL TREATMENT   Patient Name: Carolyn [name] MRN: [id number] DOB:1950/09/07, 71 y.o., female Today's Date: 12/05/2021   PT End of Session - 12/05/21 1044     Visit Number 4    Number of Visits 16    Date for PT Re-Evaluation 01/19/22    Progress Note Due on Visit 10    PT Start Time 6270    PT Stop Time 1130    PT Time Calculation (min) 45 min    Activity Tolerance Patient tolerated treatment well    Behavior During Therapy [facility] for tasks assessed/performed             Past Medical History:  Diagnosis Date   BCC (basal cell carcinoma) 08/05/2015   left neck   BCC (basal cell carcinoma) infilt 02/20/2016   right ala nasal   BCC (basal cell carcinoma) sup&nod 06/03/2016   left lower sternum   BCC (basal cell carcinoma) ulcerated 03/19/2016   mid chest between breasts   Cervical cancer ([place])    Depression    Melanoma ([place])    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[place]) 07/05/2014   right shoulder blade   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   left nare   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   left upper shin   SCC (squamous cell carcinoma) in situ 03/19/2016   right pinky   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   right thigh, left upper shin   SCC (squamous cell carcinoma) well diff 12/13/2008   left hand   SCC (squamous cell carcinoma) well diff x2 12/25/2015   left and right forearm   Thyroid disease    Past Surgical History:  Procedure Laterality Date   CERVICAL CONIZATION W/BX     EYE SURGERY Bilateral    Cataracts   MELANOMA EXCISION  08/2014   back   SHOULDER SURGERY Right    Patient Active Problem List   Diagnosis Date Noted   Systolic murmur 35/00/9381   Cervical radiculitis 11/16/2021   Hearing loss due to cerumen impaction, right 06/23/2021   Insomnia 04/08/2021   Tobacco use disorder 02/10/2017   Peripheral neuropathic  pain 08/04/2016   Pulmonary nodule 07/15/2016   Constipation 04/13/2016   Aortic atherosclerosis ([place]) 01/13/2016   COPD (chronic obstructive pulmonary disease) ([place]) 01/13/2016   Depression 07/11/2015   GAD (generalized anxiety disorder) 07/11/2015   Allergic rhinitis 07/11/2015   Hypothyroidism 07/11/2015   Restless leg syndrome 07/11/2015   ASCUS with positive high risk HPV 03/04/2015   Atypical glandular cells of undetermined significance (AGUS) on cervical Pap smear 11/06/2013   Postmenopausal state 10/09/2013   History of cervical dysplasia 10/09/2013    PCP: Dr [name]  REFERRING PROVIDER: Dr [name]  REFERRING DIAG: Cervical radiculitis; Lt shoulder pain   THERAPY DIAG:  Cervicalgia  Chronic left shoulder pain  Other symptoms and signs involving the musculoskeletal system  Abnormal posture  Rationale for Evaluation and Treatment Rehabilitation  ONSET DATE: 11/04/20  SUBJECTIVE:  SUBJECTIVE STATEMENT: 12/05/21: Pt states that TPDN continues to help. Continues to report no pain today. States she had a little pain yesterday but after weed eating she thinks she worked it out. Exercises continue to go well.   12/01/21:   Patient reports that she was really sore for the first couple of days after the needling. She is working on her exercises at home. She reports that she can tell a difference in how her neck and shoulders feel. She is '100% better than it was".  She is using her TENS unit some and that helps.    Eval: Patient reports that she started having pain in the neck and Lt shoulder over a year ago with no known injury. She has pain in the neck and into the shoulders Lt > Rt. She has pain in the Lt neck with pain into the Lt ear. It is difficult to dress and  undress   PERTINENT HISTORY:  Rt shoulder sx x 2; Lt shoulder sx x 1 ~ 10 yrs ago; sx on Rt thumb; Rt ankle for torn tendon  PAIN:  12/05/21:  Are you having pain? Yes: NPRS scale: 0/10 Pain location: bilat neck; Lt > Rt shoulders; Lt ear Pain description: sharp; throbbing in ear Aggravating factors: strain; lifting; reaching; using arms; especially lifting overhead Relieving factors: pain patch; meds  PRECAUTIONS: None  WEIGHT BEARING RESTRICTIONS No  FALLS:  Has patient fallen in last 6 months? 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 - working PT ~ 1 year  Household chores; yard work; gardening  PLOF: Independent  PATIENT GOALS get rid of the neck and shoulder pain  OBJECTIVE:   DIAGNOSTIC FINDINGS:  No x-rays or diagnostic tests   PATIENT SURVEYS:  FOTO 49   COGNITION: Overall cognitive status: Within functional limits for tasks assessed   SENSATION: Numbness and tingling in the thumb and index fingers on an intermittent basis which is worse when she is having increased neck pain   POSTURE: Head forward; shoulders rounded and elevated; head of the humerus anterior in orientation; increased thoracic kyphosis; scapulae abducted and rotated along the thoracic wall   PALPATION: Muscular tightness through ant/lat/posterior cervical musculature; pecs; upper traps; leveator Lt > Rt    CERVICAL ROM:  Tightness in cervical spine with all motions   Active ROM A/PROM (deg) eval  Flexion 51  Extension 33  Right lateral flexion 29  Left lateral flexion 27  Right rotation 55  Left rotation 63   (Blank rows = not tested)  UPPER EXTREMITY ROM:  Active ROM Right eval  Left eval  Shoulder flexion 140 143  Shoulder extension 30 35  Shoulder abduction 130 135  Shoulder adduction    Shoulder extension    Shoulder internal rotation    Shoulder external rotation    Elbow flexion    Elbow extension    Wrist flexion    Wrist extension    Wrist ulnar deviation    Wrist radial deviation    Wrist pronation    Wrist supination     (Blank rows = not tested)  UPPER EXTREMITY MMT:  MMT Right eval Left eval  Shoulder flexion 4/5 4+/5  Shoulder extension 4+/5 5/5  Shoulder abduction 4/5 4+/5  Shoulder adduction    Shoulder extension    Shoulder internal rotation    Shoulder external rotation    Middle trapezius    Lower trapezius    Elbow flexion    Elbow extension    Wrist flexion    Wrist extension    Wrist ulnar deviation    Wrist radial deviation    Wrist pronation    Wrist supination    Grip strength     (Blank rows = not tested)    TODAY'S TREATMENT:  12/05/21: Nustep L5 x 5 min UEs/LEs Doorway stretch 2 lower positions 20-30 sec x 3 reps (modified to stretch with only Lt UE due to pain in the anterior Rt shoulder from old surgery) Doorway stretch higher position 20-30 sec x 3 (tolerated better Rt shoulder in higher position) Therapeutic exercises with noodle along spine:  Chin tuck 10 sec x 10; Scap squeez 10 sec x 10; L's x 10 W's x 10 with red TB x10 Horizontal abd red TB x10 Shoulder ER red TB 2x10 Diagonals L shoulder only red TB 2x10 Row blue TB 2x10 Row iso blue TB x10 Bow and arrow TB 2x10  Manual therapy: Skilled assessment and palpation for TPDN  STM & TPR cervical paraspinals and posterior scalenes Trigger Point Dry-Needling  Treatment instructions: Expect mild to moderate muscle soreness. S/S of pneumothorax if dry needled over a lung field, and to seek immediate medical attention should they occur. Patient verbalized understanding of these instructions and education.  Patient Consent Given: Yes Education  handout provided: previously given Muscles treated:  cervical paraspinals; posterior scalene  Electrical stimulation performed: No Parameters: N/A Treatment response/outcome: decreased palpable tightness, twitch response ilicited   6/33/35:  Education re musculoskeletal dysfunction and the chronicity of musculoskeletal pain; postural correction and the importance of strengthening neck and shoulders;  therapeutic exercises with noodle along spine:  Chin tuck 10 sec x 10; Scap squeez 10 sec x 10; L's x 10; W's x 10 Doorway stretch 2 lower positions 20-30 sec x 3 reps (modified to stretch with only Lt UE due to pain in the anterior Rt shoulder from old surgery) Doorway stretch higher position 20-30 sec x 3 (tolerated better Rt shoulder in higher position) Row blue TB x 10 Bow and arrow blue TB x 10 Shoulder ER bilat red TB 10 x 2  Shoulder W red TB 10x 2  Prone scap squeeze with axial extension 5 sec x 10 (Cues for alignment and maintaining chin tuck)   Myofacial ball release thoracic spine and through pecs   Trigger Point Dry-Needling  Treatment instructions: Expect mild to moderate muscle soreness. S/S of pneumothorax if dry needled over a lung field, and to seek immediate medical attention should they occur. Patient verbalized understanding of these instructions and education.  Patient Consent Given: Yes Education handout provided: previously given Muscles treated: occipital; cervical paraspinals; scaleni; upper traps; teres bilat  Electrical stimulation performed: No Parameters: N/A Treatment response/outcome: decreased palpable tightness    Soft tissue mobilization through the cervical and posterior shoulder girdle musculature  Modalities:   Moist heat cervical and thoracic areas x 10 min  PATIENT EDUCATION:  Education details: POC; postural correction; HEP; myofacial ball release; DN Person educated: Patient Education method: Explanation, Demonstration, Tactile cues,  Verbal cues, and Handouts Education comprehension: verbalized understanding, returned demonstration, verbal cues required, tactile cues required, and needs further education   HOME EXERCISE PROGRAM: Access Code: [email or web address] URL: [email or web address] Date: 12/01/2021  Prepared by: Gillermo Murdoch  Exercises - Seated Scapular Retraction  - 2 x daily - 7 x weekly - 1-2 sets - 10 reps - 10 sec  hold - Seated Cervical Retraction  - 2 x daily - 7 x weekly - 1-2 sets - 5-10 reps - 10 sec  hold - Shoulder External Rotation and Scapular Retraction  - 3 x daily - 7 x weekly - 1 sets - 10 reps - 3-5 sec   hold - Shoulder External Rotation in 45 Degrees Abduction  - 2 x daily - 7 x weekly - 1-2 sets - 10 reps - 3 sec  hold - Seated Cervical Sidebending AROM  - 2 x daily - 7 x weekly - 1 sets - 5 reps - 10 sec  hold - Doorway Pec Stretch at 60 Degrees Abduction  - 3 x daily - 7 x weekly - 1 sets - 3 reps - Doorway Pec Stretch at 90 Degrees Abduction  - 3 x daily - 7 x weekly - 1 sets - 3 reps - 30 seconds  hold - Prone Scapular Retraction  - 2 x daily - 7 x weekly - 1 sets - 5-10 reps - 3-5 sec  hold - Standing Bilateral Low Shoulder Row with Anchored Resistance  - 2 x daily - 7 x weekly - 1-3 sets - 10 reps - 2-3 sec  hold - Drawing Bow  - 1 x daily - 7 x weekly - 1 sets - 10 reps - 3 sec  hold - Shoulder External Rotation with Resistance  - 2 x daily - 7 x weekly - 1 sets - 10 reps - 3 sec  hold - Shoulder W - External Rotation with Resistance  - 2 x daily - 7 x weekly - 1-2 sets - 10 reps - 3 sec  hold   ASSESSMENT:  CLINICAL IMPRESSION: 12/05/21: Pt continues to have great response to TPDN and manual work. Less trigger points noted. She continues to have no pain and has been diligent with her exercises. Discussed possible d/c next week if no exacerbations or new issues.   12/01/21:  Patient reports excellent response to DN and manual work - after a day or two of soreness. Pain free in neck  and shoulder areas today. [name] needs to work on correction of posture and forward positions at work and home as well as strengthening as tolerated. She has had 3 Rt shoulder surgeries in past - most recent ~ 8-9 years ago. Good tolerance of DN and noted decreased tightness post treatment. Encouraged patient to work on exercises consistently. Issued red and blue TB for HEP.   Eval:  Patient is a 71 y.o. female who was seen today for physical therapy evaluation and treatment for chronic cervical and Lt > Rt shoulder dysfunction. She has no known injury but has undergone 3 shoulder surgeries. She has poor posture and alignment; limited cervical and shoulder ROM/mobility; muscular tightness to palpation through the ant/lat/post cervical musculature, pecs, upper traps. Patient will benefit from PT to address problems identified.     OBJECTIVE IMPAIRMENTS decreased ROM, decreased strength, hypomobility, increased fascial restrictions, increased muscle spasms, impaired flexibility, impaired UE functional use, improper body mechanics, postural dysfunction, and pain.   ACTIVITY LIMITATIONS carrying, lifting, bending, sitting, standing, sleeping, dressing, reach over head, and caring for others  PARTICIPATION LIMITATIONS: cleaning, laundry, yard work, and church  PERSONAL FACTORS Age, Education, Past/current experiences, Time since onset of injury/illness/exacerbation, and 1 comorbidity: multiple surgeries bilat  shoulders  are also affecting patient's functional outcome.   REHAB POTENTIAL: Good  CLINICAL DECISION MAKING: Stable/uncomplicated  EVALUATION COMPLEXITY: Low   GOALS: Goals reviewed with patient? Yes   LONG TERM GOALS: Target date: 01/19/2022  Decrease frequency, intensity, and/or duration of pain by 50-75% allowing patient to use UE's for functional activities and rest  Baseline:  Goal status: ACHIEVED  2.  Increase cervical mobility by 3-5 degrees in all planes  Baseline:  Goal  status: IN PROGRESS  3.  Improve posture and alignment with patient to demonstrate improved upright posture with posterior shoulder girdle engaged Baseline:  Goal status: IN PROGRESS  4.  Independent in HEP  Baseline:  Goal status: IN PROGRESS  5.  Improve functional limitation score to 57 Baseline:  Goal status: IN PROGRESS   PLAN: PT FREQUENCY: 2x/week  PT DURATION: 8 weeks  PLANNED INTERVENTIONS: Therapeutic exercises, Therapeutic activity, Neuromuscular re-education, Patient/Family education, Self Care, Joint mobilization, Aquatic Therapy, Dry Needling, Electrical stimulation, Cryotherapy, Moist heat, Taping, Ultrasound, Manual therapy, and Re-evaluation  PLAN FOR NEXT SESSION: Review HEP; continue with postural correction; progress with posterior shoulder girdle strengthening; myofacial ball release work; trial of manual work and DN; modalities as indicated (pt has TENS unit at home)  [facility] April Gordy Levan, PT, DPT 12/05/2021, 10:45 AM

## 2021-12-12 ENCOUNTER — Encounter: Payer: Self-pay | Admitting: Physical Therapy

## 2021-12-12 ENCOUNTER — Ambulatory Visit: Payer: No Typology Code available for payment source | Admitting: Physical Therapy

## 2021-12-12 DIAGNOSIS — F324 Major depressive disorder, single episode, in partial remission: Secondary | ICD-10-CM | POA: Diagnosis not present

## 2021-12-12 DIAGNOSIS — F17210 Nicotine dependence, cigarettes, uncomplicated: Secondary | ICD-10-CM | POA: Diagnosis not present

## 2021-12-12 DIAGNOSIS — I700 Atherosclerosis of aorta: Secondary | ICD-10-CM | POA: Diagnosis not present

## 2021-12-12 DIAGNOSIS — G8929 Other chronic pain: Secondary | ICD-10-CM

## 2021-12-12 DIAGNOSIS — J449 Chronic obstructive pulmonary disease, unspecified: Secondary | ICD-10-CM | POA: Diagnosis not present

## 2021-12-12 DIAGNOSIS — G4700 Insomnia, unspecified: Secondary | ICD-10-CM | POA: Diagnosis not present

## 2021-12-12 DIAGNOSIS — M542 Cervicalgia: Secondary | ICD-10-CM | POA: Diagnosis not present

## 2021-12-12 DIAGNOSIS — R293 Abnormal posture: Secondary | ICD-10-CM

## 2021-12-12 DIAGNOSIS — E663 Overweight: Secondary | ICD-10-CM | POA: Diagnosis not present

## 2021-12-12 DIAGNOSIS — R29898 Other symptoms and signs involving the musculoskeletal system: Secondary | ICD-10-CM

## 2021-12-12 DIAGNOSIS — Z6827 Body mass index (BMI) 27.0-27.9, adult: Secondary | ICD-10-CM | POA: Diagnosis not present

## 2021-12-12 DIAGNOSIS — Z008 Encounter for other general examination: Secondary | ICD-10-CM | POA: Diagnosis not present

## 2021-12-12 DIAGNOSIS — E039 Hypothyroidism, unspecified: Secondary | ICD-10-CM | POA: Diagnosis not present

## 2021-12-12 NOTE — Therapy (Signed)
OUTPATIENT PHYSICAL THERAPY CERVICAL TREATMENT   Patient Name: Carolyn [name] MRN: [id number] DOB:April 27, 1950, 71 y.o., female Today's Date: 12/12/2021   PT End of Session - 12/12/21 1004     Visit Number 5    Number of Visits 16    Date for PT Re-Evaluation 01/19/22    Progress Note Due on Visit 10    PT Start Time 1005    PT Stop Time 1045    PT Time Calculation (min) 40 min    Activity Tolerance Patient tolerated treatment well    Behavior During Therapy [facility] for tasks assessed/performed             Past Medical History:  Diagnosis Date   BCC (basal cell carcinoma) 08/05/2015   left neck   BCC (basal cell carcinoma) infilt 02/20/2016   right ala nasal   BCC (basal cell carcinoma) sup&nod 06/03/2016   left lower sternum   BCC (basal cell carcinoma) ulcerated 03/19/2016   mid chest between breasts   Cervical cancer ([place])    Depression    Melanoma ([place])    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[place]) 07/05/2014   right shoulder blade   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   left nare   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   left upper shin   SCC (squamous cell carcinoma) in situ 03/19/2016   right pinky   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   right thigh, left upper shin   SCC (squamous cell carcinoma) well diff 12/13/2008   left hand   SCC (squamous cell carcinoma) well diff x2 12/25/2015   left and right forearm   Thyroid disease    Past Surgical History:  Procedure Laterality Date   CERVICAL CONIZATION W/BX     EYE SURGERY Bilateral    Cataracts   MELANOMA EXCISION  08/2014   back   SHOULDER SURGERY Right    Patient Active Problem List   Diagnosis Date Noted   Systolic murmur 65/68/1275   Cervical radiculitis 11/16/2021   Hearing loss due to cerumen impaction, right 06/23/2021   Insomnia 04/08/2021   Tobacco use disorder 02/10/2017   Peripheral neuropathic  pain 08/04/2016   Pulmonary nodule 07/15/2016   Constipation 04/13/2016   Aortic atherosclerosis ([place]) 01/13/2016   COPD (chronic obstructive pulmonary disease) ([place]) 01/13/2016   Depression 07/11/2015   GAD (generalized anxiety disorder) 07/11/2015   Allergic rhinitis 07/11/2015   Hypothyroidism 07/11/2015   Restless leg syndrome 07/11/2015   ASCUS with positive high risk HPV 03/04/2015   Atypical glandular cells of undetermined significance (AGUS) on cervical Pap smear 11/06/2013   Postmenopausal state 10/09/2013   History of cervical dysplasia 10/09/2013    PCP: Dr [name]  REFERRING PROVIDER: Dr [name]  REFERRING DIAG: Cervical radiculitis; Lt shoulder pain   THERAPY DIAG:  Cervicalgia  Chronic left shoulder pain  Other symptoms and signs involving the musculoskeletal system  Abnormal posture  Rationale for Evaluation and Treatment Rehabilitation  ONSET DATE: 11/04/20  SUBJECTIVE:  SUBJECTIVE STATEMENT: 12/12/21: Pt reports hip bothers her more. Shoulder remains good. Feels ready for d/c. Has been too busy to do all of the exercises.   12/05/21: Pt states that TPDN continues to help. Continues to report no pain today. States she had a little pain yesterday but after weed eating she thinks she worked it out. Exercises continue to go well.   Eval: Patient reports that she started having pain in the neck and Lt shoulder over a year ago with no known injury. She has pain in the neck and into the shoulders Lt > Rt. She has pain in the Lt neck with pain into the Lt ear. It is difficult to dress and undress   PERTINENT HISTORY:  Rt shoulder sx x 2; Lt shoulder sx x 1 ~ 10 yrs ago; sx on Rt thumb; Rt ankle for torn tendon  PAIN:  12/12/21:  Are you having pain? Yes:  NPRS scale: 0/10 Pain location: bilat neck; Lt > Rt shoulders; Lt ear Pain description: sharp; throbbing in ear Aggravating factors: strain; lifting; reaching; using arms; especially lifting overhead Relieving factors: pain patch; meds  PRECAUTIONS: None  WEIGHT BEARING RESTRICTIONS No  FALLS:  Has patient fallen in last 6 months? 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 - working PT ~ 1 year  Household chores; yard work; gardening  PLOF: Independent  PATIENT GOALS get rid of the neck and shoulder pain  OBJECTIVE:   DIAGNOSTIC FINDINGS:  No x-rays or diagnostic tests   PATIENT SURVEYS:  FOTO 76  SENSATION: No more N/T  POSTURE: Head forward; head of the humerus anterior in orientation; scapulae abducted and rotated along the thoracic wall    CERVICAL ROM:  Tightness in cervical spine with all motions   Active ROM A/PROM (deg) eval AROM  Flexion 51 50  Extension 33 30  Right lateral flexion 29 38  Left lateral flexion 27 35  Right rotation 55 65  Left rotation 63 78   (Blank rows = not tested)  UPPER EXTREMITY ROM:  Active ROM Right eval Left eval Right 9/8 Left 9/8  Shoulder flexion 140 143 145 155  Shoulder extension 30 35 45 45  Shoulder abduction 130 135 156 156  Shoulder adduction      Shoulder extension      Shoulder internal rotation      Shoulder external rotation      Elbow flexion      Elbow extension      Wrist flexion      Wrist extension      Wrist ulnar deviation      Wrist radial deviation      Wrist pronation      Wrist supination       (Blank rows = not tested)  UPPER EXTREMITY MMT:  MMT Right eval Left eval Right 9/8 Left 9/8   Shoulder flexion 4/5 4+/5 4/5 4+/5  Shoulder extension 4+/5 5/5 5/5 5/5  Shoulder abduction 4/5 4+/5 4+/5 5/5  Shoulder adduction      Shoulder extension      Shoulder internal rotation      Shoulder external rotation      Middle trapezius  Lower trapezius      Elbow flexion      Elbow extension      Wrist flexion      Wrist extension      Wrist ulnar deviation      Wrist radial deviation      Wrist pronation      Wrist supination      Grip strength       (Blank rows = not tested)    TODAY'S TREATMENT:  12/12/21: Nustep L6 x 5 min UEs/LEs Sitting  Figure 4 stretch x30 sec  Piriformis stretch x30 sec    12/05/21: Nustep L5 x 5 min UEs/LEs Doorway stretch 2 lower positions 20-30 sec x 3 reps (modified to stretch with only Lt UE due to pain in the anterior Rt shoulder from old surgery) Doorway stretch higher position 20-30 sec x 3 (tolerated better Rt shoulder in higher position) Therapeutic exercises with noodle along spine:  Chin tuck 10 sec x 10; Scap squeez 10 sec x 10; L's x 10 W's x 10 with red TB x10 Horizontal abd red TB x10 Shoulder ER red TB 2x10 Diagonals L shoulder only red TB 2x10 Row blue TB 2x10 Row iso blue TB x10 Bow and arrow TB 2x10  Manual therapy: Skilled assessment and palpation for TPDN  STM & TPR cervical paraspinals and posterior scalenes Trigger Point Dry-Needling  Treatment instructions: Expect mild to moderate muscle soreness. S/S of pneumothorax if dry needled over a lung field, and to seek immediate medical attention should they occur. Patient verbalized understanding of these instructions and education.  Patient Consent Given: Yes Education handout provided: previously given Muscles treated:  cervical paraspinals; posterior scalene  Electrical stimulation performed: No Parameters: N/A Treatment response/outcome: decreased palpable tightness, twitch response ilicited    PATIENT EDUCATION:  Education details: POC; postural  correction; HEP; myofacial ball release; DN Person educated: Patient Education method: Explanation, Demonstration, Tactile cues, Verbal cues, and Handouts Education comprehension: verbalized understanding, returned demonstration, verbal cues required, tactile cues required, and needs further education   HOME EXERCISE PROGRAM: Access Code: [email or web address] URL: [email or web address] Date: 12/01/2021 Prepared by: Gillermo Murdoch  Exercises - Seated Scapular Retraction  - 2 x daily - 7 x weekly - 1-2 sets - 10 reps - 10 sec  hold - Seated Cervical Retraction  - 2 x daily - 7 x weekly - 1-2 sets - 5-10 reps - 10 sec  hold - Shoulder External Rotation and Scapular Retraction  - 3 x daily - 7 x weekly - 1 sets - 10 reps - 3-5 sec   hold - Shoulder External Rotation in 45 Degrees Abduction  - 2 x daily - 7 x weekly - 1-2 sets - 10 reps - 3 sec  hold - Seated Cervical Sidebending AROM  - 2 x daily - 7 x weekly - 1 sets - 5 reps - 10 sec  hold - Doorway Pec Stretch at 60 Degrees Abduction  - 3 x daily - 7 x weekly - 1 sets - 3 reps - Doorway Pec Stretch at 90 Degrees Abduction  - 3 x daily - 7 x weekly - 1 sets - 3 reps - 30 seconds  hold - Prone Scapular Retraction  - 2 x daily - 7 x weekly - 1 sets - 5-10 reps - 3-5 sec  hold - Standing Bilateral Low Shoulder Row with Anchored Resistance  - 2 x daily - 7 x weekly - 1-3 sets - 10 reps - 2-3 sec  hold - Drawing Bow  - 1 x daily - 7 x weekly - 1 sets - 10 reps - 3 sec  hold - Shoulder External Rotation with Resistance  - 2 x daily - 7 x weekly - 1 sets - 10 reps - 3 sec  hold - Shoulder W - External Rotation with Resistance  - 2 x daily - 7 x weekly - 1-2 sets - 10 reps - 3 sec  hold   ASSESSMENT:  CLINICAL IMPRESSION: 12/12/21: Session focused on rechecking pt's goals and discussing PT d/c. Pt has met all of her LTGs. Pt feels ready for PT d/c and feels she can self manage at home.     OBJECTIVE IMPAIRMENTS decreased ROM, decreased strength,  hypomobility, increased fascial restrictions, increased muscle spasms, impaired flexibility, impaired UE functional use, improper body mechanics, postural dysfunction, and pain.   ACTIVITY LIMITATIONS carrying, lifting, bending, sitting, standing, sleeping, dressing, reach over head, and caring for others  PARTICIPATION LIMITATIONS: cleaning, laundry, yard work, and church  PERSONAL FACTORS Age, Education, Past/current experiences, Time since onset of injury/illness/exacerbation, and 1 comorbidity: multiple surgeries bilat shoulders  are also affecting patient's functional outcome.   REHAB POTENTIAL: Good  CLINICAL DECISION MAKING: Stable/uncomplicated  EVALUATION COMPLEXITY: Low   GOALS: Goals reviewed with patient? Yes   LONG TERM GOALS: Target date: 01/19/2022  Decrease frequency, intensity, and/or duration of pain by 50-75% allowing patient to use UE's for functional activities and rest  Baseline:  Goal status: ACHIEVED  2.  Increase cervical mobility by 3-5 degrees in all planes  Baseline:  Goal status: MET  3.  Improve posture and alignment with patient to demonstrate improved upright posture with posterior shoulder girdle engaged Baseline:  Goal status: MET  4.  Independent in HEP  Baseline:  Goal status: MET  5.  Improve functional limitation score to 57 Baseline:  Goal status: MET   PLAN: PT FREQUENCY: 2x/week  PT DURATION: 8 weeks  PLANNED INTERVENTIONS: Therapeutic exercises, Therapeutic activity, Neuromuscular re-education, Patient/Family education, Self Care, Joint mobilization, Aquatic Therapy, Dry Needling, Electrical stimulation, Cryotherapy, Moist heat, Taping, Ultrasound, Manual therapy, and Re-evaluation  PLAN FOR NEXT SESSION: Review HEP; continue with postural correction; progress with posterior shoulder girdle strengthening; myofacial ball release work; trial of manual work and DN; modalities as indicated (pt has TENS unit at home)  [facility]  April Gordy Levan, PT, DPT 12/12/2021, 10:04 AM

## 2021-12-15 ENCOUNTER — Ambulatory Visit: Payer: No Typology Code available for payment source | Admitting: Rehabilitative and Restorative Service Providers"

## 2021-12-15 ENCOUNTER — Ambulatory Visit (HOSPITAL_COMMUNITY)
Admission: RE | Admit: 2021-12-15 | Discharge: 2021-12-15 | Disposition: A | Discharge status: Home / Self Care | Payer: No Typology Code available for payment source | Source: Ambulatory Visit | Attending: Family Medicine | Admitting: Family Medicine

## 2021-12-15 DIAGNOSIS — J449 Chronic obstructive pulmonary disease, unspecified: Secondary | ICD-10-CM | POA: Diagnosis not present

## 2021-12-15 DIAGNOSIS — R011 Cardiac murmur, unspecified: Secondary | ICD-10-CM | POA: Insufficient documentation

## 2021-12-15 DIAGNOSIS — I352 Nonrheumatic aortic (valve) stenosis with insufficiency: Secondary | ICD-10-CM | POA: Diagnosis not present

## 2021-12-15 DIAGNOSIS — M25512 Pain in left shoulder: Secondary | ICD-10-CM | POA: Insufficient documentation

## 2021-12-15 LAB — ECHOCARDIOGRAM COMPLETE
AR max vel: 1.01 cm2
AV Area VTI: 1.17 cm2
AV Area mean vel: 1.06 cm2
AV Mean grad: 39 mmHg
AV Peak grad: 66.9 mmHg
Ao pk vel: 4.09 m/s
Area-P 1/2: 1.98 cm2
P 1/2 time: 479 msec
S' Lateral: 2.4 cm

## 2021-12-19 ENCOUNTER — Ambulatory Visit: Payer: No Typology Code available for payment source | Admitting: Physical Therapy

## 2021-12-23 ENCOUNTER — Other Ambulatory Visit: Payer: Self-pay | Admitting: Family Medicine

## 2021-12-24 ENCOUNTER — Other Ambulatory Visit: Payer: Self-pay

## 2021-12-24 ENCOUNTER — Other Ambulatory Visit: Payer: Self-pay | Admitting: Family Medicine

## 2021-12-24 DIAGNOSIS — I350 Nonrheumatic aortic (valve) stenosis: Secondary | ICD-10-CM

## 2021-12-24 NOTE — Telephone Encounter (Signed)
Results sent via [place].  See results note.

## 2021-12-26 ENCOUNTER — Other Ambulatory Visit: Payer: Self-pay | Admitting: Family Medicine

## 2021-12-26 NOTE — Telephone Encounter (Signed)
Started on lyrica by neuro previously and stated she didn't need the clonazepam.  Is she needing to add this back on?

## 2021-12-26 NOTE — Telephone Encounter (Signed)
Pt called and is requesting a refill on Klonopin. Last seen in August by Dr Zigmund Daniel and scheduled her 6 mo f/u in Feb. Pt states her medication was denied and she is not sure why.

## 2021-12-29 ENCOUNTER — Other Ambulatory Visit: Payer: Self-pay | Admitting: Family Medicine

## 2021-12-29 MED ORDER — CLONAZEPAM 0.5 MG PO TABS: 0.5000 mg | ORAL_TABLET | Freq: Every day | ORAL | 2 refills | Status: DC

## 2021-12-29 NOTE — Telephone Encounter (Signed)
Pt states that Neuro stopped the citalopram and added Lexapro. She would like to continue taking Klonopin.

## 2022-01-22 ENCOUNTER — Ambulatory Visit (INDEPENDENT_AMBULATORY_CARE_PROVIDER_SITE_OTHER): Payer: No Typology Code available for payment source

## 2022-01-22 DIAGNOSIS — Z1231 Encounter for screening mammogram for malignant neoplasm of breast: Secondary | ICD-10-CM

## 2022-01-30 ENCOUNTER — Ambulatory Visit (INDEPENDENT_AMBULATORY_CARE_PROVIDER_SITE_OTHER): Payer: No Typology Code available for payment source

## 2022-01-30 ENCOUNTER — Encounter: Payer: Self-pay | Admitting: Medical-Surgical

## 2022-01-30 ENCOUNTER — Ambulatory Visit (INDEPENDENT_AMBULATORY_CARE_PROVIDER_SITE_OTHER): Payer: No Typology Code available for payment source | Admitting: Medical-Surgical

## 2022-01-30 VITALS — BP 133/77 | HR 86 | Resp 20 | Ht 65.0 in | Wt 166.1 lb

## 2022-01-30 DIAGNOSIS — M5441 Lumbago with sciatica, right side: Secondary | ICD-10-CM

## 2022-01-30 DIAGNOSIS — G8929 Other chronic pain: Secondary | ICD-10-CM | POA: Diagnosis not present

## 2022-01-30 DIAGNOSIS — M5136 Other intervertebral disc degeneration, lumbar region: Secondary | ICD-10-CM | POA: Diagnosis not present

## 2022-01-30 DIAGNOSIS — M5442 Lumbago with sciatica, left side: Secondary | ICD-10-CM | POA: Diagnosis not present

## 2022-01-30 DIAGNOSIS — M545 Low back pain, unspecified: Secondary | ICD-10-CM | POA: Diagnosis not present

## 2022-01-30 MED ORDER — CELECOXIB 200 MG PO CAPS: 200.0000 mg | ORAL_CAPSULE | Freq: Two times a day (BID) | ORAL | 2 refills | Status: DC

## 2022-01-30 MED ORDER — PREDNISONE 50 MG PO TABS: 50.0000 mg | ORAL_TABLET | Freq: Every day | ORAL | 0 refills | Status: DC

## 2022-01-30 NOTE — Progress Notes (Signed)
Established Patient Office Visit  Subjective   Patient ID: Carolyn [name], female   DOB: 1950/07/25 Age: 71 y.o. MRN: [id number]   Chief Complaint  Patient presents with   Urinary Incontinence   Leg Pain   HPI Pleasant 71 year old female presenting today with complaints of bilateral low back pain with radiation to the right and left leg.  Notes that over the past 2 weeks her pain has gotten much worse although she has had this ongoing for a while.  She works at [employer] in [employer] and spends long hours on her feet.  She does have peripheral neuropathy being evaluated and treated by neurology with pregabalin 75 mg nightly.  Is not sure if she needs an increase in her dose there or not.  No recent trauma or injury.  She does have ongoing urinary incontinence but feels this is progressively getting worse as well.  Having periodic muscle spasms that affect both thighs but no difficulty walking, sudden weakness/numbness/tingling of the lower extremities, or saddle paresthesias.  Takes ibuprofen 800 mg at least once daily.  Has baclofen ordered but reports that the 10 mg dose did not help so she took 20 mg which was also equally unhelpful.   Objective:    Vitals:   01/30/22 1544  BP: 133/77  Pulse: 86  Resp: 20  Height: '5\' 5"'$  (1.651 m)  Weight: 166 lb 1.3 oz (75.3 kg)  SpO2: 91%  BMI (Calculated): 27.64    Physical Exam Vitals and nursing note reviewed.  Constitutional:      General: She is not in acute distress.    Appearance: Normal appearance. She is not ill-appearing.  HENT:     Head: Normocephalic and atraumatic.  Cardiovascular:     Rate and Rhythm: Normal rate and regular rhythm.     Pulses: Normal pulses.     Heart sounds: Normal heart sounds.  Pulmonary:     Effort: Pulmonary effort is normal. No respiratory distress.     Breath sounds: Normal breath sounds. No wheezing, rhonchi or rales.  Musculoskeletal:     Comments: Negative straight leg raise, FABER, and FADIR  test bilaterally.  Skin:    General: Skin is warm and dry.  Neurological:     Mental Status: She is alert and oriented to person, place, and time.  Psychiatric:        Mood and Affect: Mood normal.        Behavior: Behavior normal.        Thought Content: Thought content normal.        Judgment: Judgment normal.   No results found for this or any previous visit (from the past 24 hour(s)).     The 10-year ASCVD risk score (Arnett DK, et al., 2019) is: 12.9%   Values used to calculate the score:     Age: 62 years     Sex: Female     Is Non-Hispanic African American: No     Diabetic: No     Tobacco smoker: Yes     Systolic Blood Pressure: 478 mmHg     Is BP treated: No     HDL Cholesterol: 90 mg/dL     Total Cholesterol: 180 mg/dL   Assessment & Plan:   1. Chronic bilateral low back pain with bilateral sciatica Lumbar spine x-rays today.  Treating with prednisone 50 mg daily x5 days.  After that, recommend starting Celebrex 200 mg twice daily as needed for pain.  Advised to avoid  all other NSAIDs while taking Celebrex.  Start the Celebrex after finishing the prednisone burst.  Would like for her to contact her neurologist regarding her pregabalin dose since they are managing this medication.  Incontinence is not new so no red flags today.  Recommend working on stretching and lower extremity and core strengthening exercises.  Recommend very supportive shoes while at work. - predniSONE (DELTASONE) 50 MG tablet; Take 1 tablet (50 mg total) by mouth daily.  Dispense: 5 tablet; Refill: 0 - celecoxib (CELEBREX) 200 MG capsule; Take 1 capsule (200 mg total) by mouth 2 (two) times daily. One to 2 tablets by mouth daily as needed for pain.  Dispense: 60 capsule; Refill: 2 - DG Lumbar Spine Complete; Future  Return if symptoms worsen or fail to improve.  ___________________________________________ Clearnce Sorrel, DNP, APRN, FNP-BC [facility] [place]

## 2022-02-02 ENCOUNTER — Ambulatory Visit: Payer: No Typology Code available for payment source | Admitting: Psychiatry

## 2022-02-20 ENCOUNTER — Other Ambulatory Visit: Payer: Self-pay | Admitting: Psychiatry

## 2022-02-23 ENCOUNTER — Other Ambulatory Visit: Payer: Self-pay | Admitting: Family Medicine

## 2022-02-23 DIAGNOSIS — I700 Atherosclerosis of aorta: Secondary | ICD-10-CM

## 2022-02-25 ENCOUNTER — Other Ambulatory Visit: Payer: Self-pay | Admitting: Family Medicine

## 2022-03-23 ENCOUNTER — Other Ambulatory Visit: Payer: Self-pay | Admitting: Psychiatry

## 2022-03-24 ENCOUNTER — Ambulatory Visit
Admission: EM | Admit: 2022-03-24 | Discharge: 2022-03-24 | Disposition: A | Discharge status: Home / Self Care | Payer: No Typology Code available for payment source | Attending: Family Medicine | Admitting: Family Medicine

## 2022-03-24 DIAGNOSIS — J209 Acute bronchitis, unspecified: Secondary | ICD-10-CM

## 2022-03-24 DIAGNOSIS — G8929 Other chronic pain: Secondary | ICD-10-CM

## 2022-03-24 DIAGNOSIS — J441 Chronic obstructive pulmonary disease with (acute) exacerbation: Secondary | ICD-10-CM | POA: Diagnosis not present

## 2022-03-24 DIAGNOSIS — F17200 Nicotine dependence, unspecified, uncomplicated: Secondary | ICD-10-CM | POA: Diagnosis not present

## 2022-03-24 MED ORDER — DOXYCYCLINE HYCLATE 100 MG PO CAPS: 100.0000 mg | ORAL_CAPSULE | Freq: Two times a day (BID) | ORAL | 0 refills | Status: DC

## 2022-03-24 MED ORDER — PREDNISONE 50 MG PO TABS: 50.0000 mg | ORAL_TABLET | Freq: Every day | ORAL | 0 refills | Status: DC

## 2022-03-24 MED ORDER — BENZONATATE 100 MG PO CAPS: 100.0000 mg | ORAL_CAPSULE | Freq: Three times a day (TID) | ORAL | 0 refills | Status: DC

## 2022-03-24 NOTE — ED Triage Notes (Signed)
Pt c/o cough and congestion since Sunday morning. Denies fever. Taking Robitussin prn.

## 2022-03-24 NOTE — Progress Notes (Signed)
   CC:  RLS  Follow-up Visit  Last visit: 08/29/21  Brief HPI: 71 year old female with a history of hypothyroidism, HLD, COPD who follows in clinic for RLS.  At her last visit she was started on Lyrica 75 QHS and continued on ropinirole. Iron levels were low and she was started on iron supplementation.  Interval History: Her RLS has significantly improved since her last visit. She has some residual intermittent mild tingling, but this is not particularly bothersome to her. She is tolerating Lyrica well without issues, and has been able to wean Requip down to just 3 mg at bedtime. She took iron supplements for ~3 months, and has not been on these since August.  Previous therapies: Klonopin 0.5 mg QHS Requip 1.5/1.5/3 mg Baclofen 10 mg TID Gabapentin 300 mg TID - urinary incontinence at night  Physical Exam:   Vital Signs: Ht 5\' 5"  (1.651 m)   Wt 167 lb 12.8 oz (76.1 kg)   BMI 27.92 kg/m  GENERAL:  well appearing, in no acute distress, alert  SKIN:  Color, texture, turgor normal. No rashes or lesions HEAD:  Normocephalic/atraumatic. RESP: normal respiratory effort MSK:  No gross joint deformities.   NEUROLOGICAL: Mental Status: Alert, oriented to person, place and time, Follows commands, and Speech fluent and appropriate. Cranial Nerves: PERRL, face symmetric, no dysarthria, hearing grossly intact Motor: moves all extremities equally Gait: normal-based.  IMPRESSION: 71 year old female with a history of hypothyroidism, HLD, COPD who presents for follow up of restless leg syndrome. Her symptoms have improved significantly with iron supplementation and Lyrica. Will recheck iron levels today and continue her current medication regimen.   PLAN: -Check iron levels today -Continue Lyrica 75 mg QHS, ropinirole 3 mg QHS   Follow-up: 6 months  I spent a total of 15 minutes on the date of the service. Discussed medication side effects, adverse reactions and drug interactions.  Written educational materials and patient instructions outlining all of the above were given.  Ocie Doyne, MD 03/25/22 2:24 PM

## 2022-03-24 NOTE — Discharge Instructions (Signed)
drink lots of water Take doxycycline antibiotic 2 times a day.  Take with food Take prednisone once a day for 5 days.  Start tomorrow May take Tessalon 2-3 times a day for cough See your doctor if not improved by next week

## 2022-03-24 NOTE — ED Provider Notes (Signed)
Carolyn [name] C[facility]    CSN: [id number] Arrival date & time: 03/24/22  1637      History   Chief Complaint Chief Complaint  Patient presents with   Cough   Nasal Congestion    HPI Carolyn [name] is a 71 y.o. female.   HPI  Carolyn [name] is a COPD patient who continues to smoke cigarettes is here with a respiratory infection.  Coughing and chest congestion.  Runny nose and postnasal drip.  Fatigue.  No fever or chills.  No headache or body ache.  She states she has a "strong cough" that is causing her chest to hurt.  Some increase in shortness of breath.  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   left nare   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   left upper shin   SCC (squamous cell carcinoma) in situ 03/19/2016   right pinky   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   right thigh, left upper shin   SCC (squamous cell carcinoma) well diff 12/13/2008   left hand   SCC (squamous cell carcinoma) well diff x2 12/25/2015   left and right forearm   Thyroid disease     Patient Active Problem List   Diagnosis Date Noted   Systolic murmur 35/70/1779   Cervical radiculitis 11/16/2021   Hearing loss due to cerumen impaction, right 06/23/2021   Insomnia 04/08/2021   Tobacco use disorder 02/10/2017   Peripheral neuropathic pain 08/04/2016   Pulmonary nodule 07/15/2016   Constipation 04/13/2016   Aortic atherosclerosis  ([place]) 01/13/2016   COPD (chronic obstructive pulmonary disease) ([place]) 01/13/2016   Depression 07/11/2015   GAD (generalized anxiety disorder) 07/11/2015   Allergic rhinitis 07/11/2015   Hypothyroidism 07/11/2015   Restless leg syndrome 07/11/2015   ASCUS with positive high risk HPV 03/04/2015   Atypical glandular cells of undetermined significance (AGUS) on cervical Pap smear 11/06/2013   Postmenopausal state 10/09/2013   History of cervical dysplasia 10/09/2013    Past Surgical History:  Procedure Laterality Date   CERVICAL CONIZATION W/BX     EYE SURGERY Bilateral    Cataracts   MELANOMA EXCISION  08/2014   back   SHOULDER SURGERY Right     OB History     Gravida  3   Para  2   Term  2   Preterm  0   AB  1   Living  2      SAB  1   IAB      Ectopic      Multiple      Live Births  2            Home Medications    Prior to Admission medications   Medication Sig Start Date End Date Taking? Authorizing Provider  aspirin 81 MG EC tablet TAKE 1 TABLET BY MOUTH EVERY  DAY 11/11/18   Terald Sleeper, PA-C  atorvastatin (LIPITOR) 10 MG tablet TAKE 1 TABLET BY MOUTH EVERY DAY 02/24/22   [name], DO  baclofen (LIORESAL) 10 MG tablet TAKE 1 TABLET BY MOUTH THREE TIMES A DAY 02/24/22   [name], DO  benzonatate (TESSALON) 100 MG capsule Take 1 capsule (100 mg total) by mouth every 8 (eight) hours. 03/24/22  Yes Raylene Everts, MD  BREO ELLIPTA 100-25 MCG/ACT AEPB TAKE 1 PUFF BY MOUTH EVERY DAY 02/24/22   [name], DO  Calcium Carbonate (CALCIUM 600 PO) Take by mouth.    [provider]  celecoxib (CELEBREX) 200 MG capsule Take 1 capsule (200 mg total) by mouth 2 (two) times daily. One to 2 tablets by mouth daily as needed for pain. 01/30/22   Samuel Bouche, NP  clonazePAM (KLONOPIN) 0.5 MG tablet Take 1 tablet (0.5 mg total) by mouth at bedtime. 12/29/21   [name], DO  doxycycline (VIBRAMYCIN) 100 MG capsule Take 1 capsule (100 mg  total) by mouth 2 (two) times daily. 03/24/22  Yes Raylene Everts, MD  fluticasone [facility]) 50 MCG/ACT nasal spray SPRAY 1 SPRAY INTO BOTH NOSTRILS DAILY 04/09/21   [name], DO  Garlic 3299 MG CAPS Take by mouth.    [provider]  levothyroxine (SYNTHROID) 100 MCG tablet TAKE 1 TABLET BY MOUTH DAILY BEFORE BREAKFAST. 10/27/21   [name], DO  Multiple Vitamins-Minerals (CENTRUM SILVER ADULT 50+ PO) Take 1 tablet by mouth daily.    [provider]  Omega-3 Fatty Acids (FISH OIL) 1000 MG CAPS Take by mouth.    [provider]  predniSONE (DELTASONE) 50 MG tablet Take 1 tablet (50 mg total) by mouth daily. 03/24/22   Raylene Everts, MD  pregabalin (LYRICA) 75 MG capsule TAKE 1 CAPSULE (75 MG TOTAL) BY MOUTH AT BEDTIME AS NEEDED. 02/24/22   Genia Harold, MD  rOPINIRole (REQUIP) 3 MG tablet TAKE 1/2 TAB IN THE MORNING, 1/2 TAB IN THE AFTERNOON AND FULL TAB AT BEDTIME. 02/25/22   [name], DO    Family History Family History  Problem Relation Age of Onset   Heart disease Mother    Aneurysm Mother    Heart disease Father    Kidney failure Father    Cancer Sister    Cancer Brother    Cirrhosis Daughter    Cancer Sister        Bronchial    Social History Social History   Tobacco Use   Smoking status: Every Day    Packs/day: 0.50    Types: Cigarettes    Start date: 04/06/1968    Passive exposure: Never   Smokeless tobacco: Never  Vaping Use   Vaping Use: Never used  Substance Use Topics   Alcohol use: Not Currently   Drug use: No     Allergies   Trazodone and nefazodone and Azithromycin   Review of Systems Review of Systems   Physical Exam Triage Vital Signs ED Triage Vitals  Enc Vitals Group     BP 03/24/22 1721 (!) 152/80     Pulse Rate 03/24/22 1721 90     Resp 03/24/22 1721 18     Temp 03/24/22 1721 98.1 F (36.7 C)     Temp Source 03/24/22 1721 Oral     SpO2 03/24/22 1721 92 %     Weight --      Height --       Head Circumference --  Peak Flow --      Pain Score 03/24/22 1722 0     Pain Loc --      Pain Edu? --      Excl. in [place]? --    No data found.  Updated Vital Signs BP (!) 152/80 (BP Location: Right Arm)   Pulse 90   Temp 98.1 F (36.7 C) (Oral)   Resp 18   SpO2 92%       Physical Exam Constitutional:      General: She is not in acute distress.    Appearance: She is well-developed. She is ill-appearing.  HENT:     Head: Normocephalic and atraumatic.  Eyes:     Conjunctiva/sclera: Conjunctivae normal.     Pupils: Pupils are equal, round, and reactive to light.  Cardiovascular:     Rate and Rhythm: Normal rate.     Heart sounds: Normal heart sounds.  Pulmonary:     Effort: Pulmonary effort is normal. No respiratory distress.     Breath sounds: Wheezing and rhonchi present.  Abdominal:     General: There is no distension.     Palpations: Abdomen is soft.  Musculoskeletal:        General: Normal range of motion.     Cervical back: Normal range of motion.  Skin:    General: Skin is warm and dry.  Neurological:     Mental Status: She is alert.      UC Treatments / Results  Labs (all labs ordered are listed, but only abnormal results are displayed) Labs Reviewed - No data to display  EKG   Radiology No results found.  Procedures Procedures (including critical care time)  Medications Ordered in UC Medications - No data to display     Will treat patient with antibiotics, and steroids.  COPD D exacerbation with cough.  Follow-up with her PCP Final Clinical Impressions(s) / UC Diagnoses   Final diagnoses:  Acute bronchitis, unspecified organism  Tobacco dependence with current use  COPD exacerbation ([place])     Discharge Instructions       drink lots of water Take doxycycline antibiotic 2 times a day.  Take with food Take prednisone once a day for 5 days.  Start tomorrow May take Tessalon 2-3 times a day for cough See your doctor if not  improved by next week   ED Prescriptions     Medication Sig Dispense Auth. Provider   predniSONE (DELTASONE) 50 MG tablet Take 1 tablet (50 mg total) by mouth daily. 5 tablet Raylene Everts, MD   doxycycline (VIBRAMYCIN) 100 MG capsule Take 1 capsule (100 mg total) by mouth 2 (two) times daily. 20 capsule Raylene Everts, MD   benzonatate (TESSALON) 100 MG capsule Take 1 capsule (100 mg total) by mouth every 8 (eight) hours. 21 capsule Raylene Everts, MD      PDMP not reviewed this encounter.   Raylene Everts, MD 03/24/22 Johnnye Lana

## 2022-03-25 ENCOUNTER — Ambulatory Visit: Payer: No Typology Code available for payment source | Admitting: Psychiatry

## 2022-03-25 ENCOUNTER — Other Ambulatory Visit: Payer: Self-pay | Admitting: Psychiatry

## 2022-03-25 ENCOUNTER — Encounter: Payer: Self-pay | Admitting: Psychiatry

## 2022-03-25 VITALS — BP 125/73 | HR 75 | Ht 65.0 in | Wt 167.8 lb

## 2022-03-25 DIAGNOSIS — D508 Other iron deficiency anemias: Secondary | ICD-10-CM | POA: Diagnosis not present

## 2022-03-25 MED ORDER — PREGABALIN 75 MG PO CAPS: 75.0000 mg | ORAL_CAPSULE | Freq: Every evening | ORAL | 5 refills | Status: DC | PRN

## 2022-03-26 LAB — IRON,TIBC AND FERRITIN PANEL
Ferritin: 117 ng/mL (ref 15–150)
Iron Saturation: 5 % — CL (ref 15–55)
Iron: 15 ug/dL — ABNORMAL LOW (ref 27–139)
Total Iron Binding Capacity: 310 ug/dL (ref 250–450)
UIBC: 295 ug/dL (ref 118–369)

## 2022-03-26 MED ORDER — FERROUS SULFATE 325 (65 FE) MG PO TBEC
325.0000 mg | DELAYED_RELEASE_TABLET | Freq: Every day | ORAL | 5 refills | Status: DC
Start: 2022-03-26 — End: 2022-07-15

## 2022-03-26 NOTE — Addendum Note (Signed)
Addended by: Genia Harold on: 03/26/2022 09:46 AM   Modules accepted: Orders

## 2022-03-31 ENCOUNTER — Other Ambulatory Visit: Payer: Self-pay

## 2022-03-31 ENCOUNTER — Other Ambulatory Visit: Payer: Self-pay | Admitting: Family Medicine

## 2022-04-07 NOTE — Progress Notes (Signed)
Referring-Cody Rodena Piety, DO Reason for referral-aortic stenosis  HPI: 72 year old female for evaluation of aortic stenosis at request of Weston Settle, DO.  Chest CT October 2019 showed aortic atherosclerosis without aneurysm formation.  Recently noted to have a murmur.  Echocardiogram September 2023 showed normal LV function, grade 1 diastolic dysfunction, moderate left atrial enlargement, moderate to severe aortic stenosis with mean gradient 39 mmHg, peak velocity 4.09 m/s, dimensionless index 0.37, aortic valve area 1.17 cm), mild aortic insufficiency, PFO noted.  Cardiology now asked to evaluate.  Patient has mild dyspnea on exertion but works routinely at [employer] and is very active without symptoms.  She denies orthopnea, PND, pedal edema, exertional chest pain or syncope.  Cardiology now asked to evaluate.  Current Outpatient Medications  Medication Sig Dispense Refill   aspirin 81 MG EC tablet TAKE 1 TABLET BY MOUTH EVERY DAY 90 tablet 3   atorvastatin (LIPITOR) 10 MG tablet TAKE 1 TABLET BY MOUTH EVERY DAY 90 tablet 3   baclofen (LIORESAL) 10 MG tablet TAKE 1 TABLET BY MOUTH THREE TIMES A DAY 270 tablet 1   benzonatate (TESSALON) 100 MG capsule Take 1 capsule (100 mg total) by mouth every 8 (eight) hours. 21 capsule 0   BREO ELLIPTA 100-25 MCG/ACT AEPB TAKE 1 PUFF BY MOUTH EVERY DAY 60 each 11   Calcium Carbonate (CALCIUM 600 PO) Take by mouth.     celecoxib (CELEBREX) 200 MG capsule TAKE 1 TO 2 CAPSULES BY MOUTH DAILY AS NEEDED FOR PAIN. 60 capsule 1   clonazePAM (KLONOPIN) 0.5 MG tablet TAKE 1 TABLET BY MOUTH AT BEDTIME. 30 tablet 0   doxycycline (VIBRAMYCIN) 100 MG capsule Take 1 capsule (100 mg total) by mouth 2 (two) times daily. 20 capsule 0   ferrous sulfate 325 (65 FE) MG EC tablet Take 1 tablet (325 mg total) by mouth daily with breakfast. 30 tablet 5   fluticasone (FLONASE) 50 MCG/ACT nasal spray INSTILL 1 SPRAY INTO BOTH NOSTRILS DAILY 48 mL 1   Garlic 3903  MG CAPS Take by mouth.     levothyroxine (SYNTHROID) 100 MCG tablet TAKE 1 TABLET BY MOUTH DAILY BEFORE BREAKFAST. 90 tablet 3   Multiple Vitamins-Minerals (CENTRUM SILVER ADULT 50+ PO) Take 1 tablet by mouth daily.     Omega-3 Fatty Acids (FISH OIL) 1000 MG CAPS Take by mouth.     pregabalin (LYRICA) 75 MG capsule Take 1 capsule (75 mg total) by mouth at bedtime as needed. 30 capsule 5   rOPINIRole (REQUIP) 3 MG tablet TAKE 1 TABLET BY MOUTH AT BEDTIME. 90 tablet 0   No current facility-administered medications for this visit.    Allergies  Allergen Reactions   Trazodone And Nefazodone Shortness Of Breath   Azithromycin Rash     Past Medical History:  Diagnosis Date   BCC (basal cell carcinoma) 08/05/2015   left neck   BCC (basal cell carcinoma) infilt 02/20/2016   right ala nasal   BCC (basal cell carcinoma) sup&nod 06/03/2016   left lower sternum   BCC (basal cell carcinoma) ulcerated 03/19/2016   mid chest between breasts   Cervical cancer (HCC)    Cervical cancer (HCC)    COPD (chronic obstructive pulmonary disease) ([place])    Depression    Hyperlipidemia    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[place]) 07/05/2014   right shoulder blade   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   SCC (squamous cell carcinoma) in situ  08/05/2015   left nare   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   left upper shin   SCC (squamous cell carcinoma) in situ 03/19/2016   right pinky   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   right thigh, left upper shin   SCC (squamous cell carcinoma) well diff 12/13/2008   left hand   SCC (squamous cell carcinoma) well diff x2 12/25/2015   left and right forearm   Thyroid disease     Past Surgical History:  Procedure Laterality Date   CERVICAL CONIZATION W/BX     EYE SURGERY Bilateral    Cataracts   MELANOMA EXCISION  08/2014   back   SHOULDER SURGERY Right     Social History   Socioeconomic History   Marital  status: Divorced    Spouse name: Not on file   Number of children: 2   Years of education: Not on file   Highest education level: GED or equivalent  Occupational History   Occupation: disabled  Tobacco Use   Smoking status: Every Day    Packs/day: 0.50    Types: Cigarettes    Start date: 04/06/1968    Passive exposure: Never   Smokeless tobacco: Never  Vaping Use   Vaping Use: Never used  Substance and Sexual Activity   Alcohol use: Never   Drug use: No   Sexual activity: Not on file  Other Topics Concern   Not on file  Social History Narrative   Lives alone   R handed   Caffeine: half a litter of soda in a day.    Social Determinants of Health   Financial Resource Strain: Low Risk  (05/12/2018)   Overall Financial Resource Strain (CARDIA)    Difficulty of Paying Living Expenses: Not hard at all  Food Insecurity: No Food Insecurity (05/12/2018)   Hunger Vital Sign    Worried About Running Out of Food in the Last Year: Never true    Ran Out of Food in the Last Year: Never true  Transportation Needs: No Transportation Needs (05/12/2018)   PRAPARE - Hydrologist (Medical): No    Lack of Transportation (Non-Medical): No  Physical Activity: Inactive (05/12/2018)   Exercise Vital Sign    Days of Exercise per Week: 0 days    Minutes of Exercise per Session: 0 min  Stress: No Stress Concern Present (05/12/2018)   [place]    Feeling of Stress : Not at all  Social Connections: Somewhat Isolated (05/12/2018)   Social Connection and Isolation Panel [NHANES]    Frequency of Communication with Friends and Family: More than three times a week    Frequency of Social Gatherings with Friends and Family: More than three times a week    Attends Religious Services: More than 4 times per year    Active Member of [employer] or Organizations: No    Attends Archivist Meetings: Never    Marital  Status: Divorced  Human resources officer Violence: Not At Risk (05/12/2018)   Humiliation, Afraid, Rape, and Kick questionnaire    Fear of Current or Ex-Partner: No    Emotionally Abused: No    Physically Abused: No    Sexually Abused: No    Family History  Problem Relation Age of Onset   Heart disease Mother    Aneurysm Mother    Heart disease Father    Kidney failure Father  Cancer Sister    Cancer Brother    Cirrhosis Daughter    Cancer Sister        Bronchial    ROS: no fevers or chills, productive cough, hemoptysis, dysphasia, odynophagia, melena, hematochezia, dysuria, hematuria, rash, seizure activity, orthopnea, PND, pedal edema, claudication. Remaining systems are negative.  Physical Exam:   Blood pressure (!) 160/80, pulse 74, height '5\' 5"'$  (1.651 m), weight 168 lb (76.2 kg), SpO2 97 %.  General:  Well developed/well nourished in NAD Skin warm/dry Patient not depressed No peripheral clubbing Back-normal HEENT-normal/normal eyelids Neck supple/normal carotid upstroke bilaterally; no bruits; no JVD; no thyromegaly chest - CTA/ normal expansion CV - RRR/normal S1 and S2; 3/6 systolic murmur right sternal border; S2 is diminished; radiating to the carotids Abdomen -NT/ND, no HSM, no mass, + bowel sounds, positive bruit 2+ femoral pulses, no bruits Ext-no edema, chords, 2+ DP Neuro-grossly nonfocal  ECG -normal sinus rhythm at a rate of 74, normal axis, nonspecific ST changes.  Personally reviewed  A/P  1 aortic stenosis/aortic insufficiency-I have personally reviewed the patient's echocardiogram.  She has severe aortic stenosis with peak velocity greater than 4 m/s.  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  However she is not having significant symptoms.  She is able to be very functional without significant dyspnea, chest pain and there is no history of syncope.  We discussed her aortic stenosis  today and I explained that she will likely require aortic valve replacement in the future.  She will contact us with any worsening symptoms.  Otherwise we will plan repeat echocardiogram in 6 months and I will see her back immediately afterwards.  Of note patient had a CT October 2019 that showed no thoracic aortic aneurysm.  2 hyperlipidemia-continue statin.  3 bruit-patient with abdominal bruit and family history of abdominal aortic aneurysm repair.  She also has a history of tobacco use.  Will arrange abdominal ultrasound to exclude aneurysm.  4 tobacco abuse-patient counseled on discontinuing.  5 elevated blood pressure reading-she states her blood pressure is typically controlled.  I have asked her to check this at home and we will add medications as needed.  Kirk Ruths, MD

## 2022-04-10 ENCOUNTER — Other Ambulatory Visit: Payer: Self-pay | Admitting: Medical-Surgical

## 2022-04-10 ENCOUNTER — Other Ambulatory Visit: Payer: Self-pay | Admitting: Family Medicine

## 2022-04-10 DIAGNOSIS — G8929 Other chronic pain: Secondary | ICD-10-CM

## 2022-04-20 ENCOUNTER — Ambulatory Visit (INDEPENDENT_AMBULATORY_CARE_PROVIDER_SITE_OTHER): Payer: No Typology Code available for payment source | Admitting: Cardiology

## 2022-04-20 ENCOUNTER — Encounter: Payer: Self-pay | Admitting: Cardiology

## 2022-04-20 VITALS — BP 160/80 | HR 74 | Ht 65.0 in | Wt 168.0 lb

## 2022-04-20 DIAGNOSIS — R0989 Other specified symptoms and signs involving the circulatory and respiratory systems: Secondary | ICD-10-CM

## 2022-04-20 DIAGNOSIS — E7800 Pure hypercholesterolemia, unspecified: Secondary | ICD-10-CM | POA: Diagnosis not present

## 2022-04-20 DIAGNOSIS — I350 Nonrheumatic aortic (valve) stenosis: Secondary | ICD-10-CM | POA: Diagnosis not present

## 2022-04-20 NOTE — Patient Instructions (Signed)
  Testing/Procedures: Your physician has requested that you have an abdominal aorta duplex. During this test, an ultrasound is used to evaluate the aorta. Allow 30 minutes for this exam. Do not eat after midnight the day before and avoid carbonated beverages [address] [place] has requested that you have an echocardiogram. Echocardiography is a painless test that uses sound waves to create images of your heart. It provides your doctor with information about the size and shape of your heart and how well your heart's chambers and valves are working. This procedure takes approximately one hour. There are no restrictions for this procedure. Please do NOT wear cologne, perfume, aftershave, or lotions (deodorant is allowed). Please arrive 15 minutes prior to your appointment time. [place], you and your health needs are our priority.  As part of our continuing mission to provide you with exceptional heart care, we have created designated Provider Care Teams.  These Care Teams include your primary Cardiologist (physician) and Advanced Practice Providers (APPs -  Physician Assistants and Nurse Practitioners) who all work together to provide you with the care you need, when you need it.  We recommend signing up for the patient portal called "MyChart".  Sign up information is provided on this After Visit Summary.  MyChart is used to connect with patients for Virtual Visits (Telemedicine).  Patients are able to view lab/test results, encounter notes, upcoming appointments, etc.  Non-urgent messages can be sent to your provider as well.   To learn more about what you can do with MyChart, go to [email or web address].    Your next appointment:   6 month(s)  Provider:   Kirk Ruths, MD

## 2022-04-22 ENCOUNTER — Other Ambulatory Visit: Payer: Self-pay | Admitting: Medical-Surgical

## 2022-04-29 ENCOUNTER — Encounter: Payer: Self-pay | Admitting: Obstetrics and Gynecology

## 2022-04-29 ENCOUNTER — Ambulatory Visit (INDEPENDENT_AMBULATORY_CARE_PROVIDER_SITE_OTHER): Payer: No Typology Code available for payment source | Admitting: Obstetrics and Gynecology

## 2022-04-29 ENCOUNTER — Other Ambulatory Visit (HOSPITAL_COMMUNITY)
Admission: RE | Admit: 2022-04-29 | Discharge: 2022-04-29 | Disposition: A | Discharge status: Home / Self Care | Payer: No Typology Code available for payment source | Source: Ambulatory Visit | Attending: Obstetrics and Gynecology | Admitting: Obstetrics and Gynecology

## 2022-04-29 VITALS — BP 159/77 | HR 64 | Resp 16 | Ht 65.0 in | Wt 171.0 lb

## 2022-04-29 DIAGNOSIS — Z124 Encounter for screening for malignant neoplasm of cervix: Secondary | ICD-10-CM | POA: Insufficient documentation

## 2022-04-29 DIAGNOSIS — N879 Dysplasia of cervix uteri, unspecified: Secondary | ICD-10-CM | POA: Diagnosis not present

## 2022-04-29 DIAGNOSIS — Z1151 Encounter for screening for human papillomavirus (HPV): Secondary | ICD-10-CM | POA: Diagnosis not present

## 2022-04-29 DIAGNOSIS — B977 Papillomavirus as the cause of diseases classified elsewhere: Secondary | ICD-10-CM | POA: Diagnosis not present

## 2022-04-29 NOTE — Progress Notes (Signed)
   RETURN GYNECOLOGY VISIT  Subjective:  Carolyn [name] is a 72 y.o. menopausal [id number] with longstanding history of abnormal paps presenting for pap follow up  Last seen by Dr. Harolyn Rutherford on 04/24/21 for colpo for persistently HPV+ paps. No lesion noted, but colpo inadequate because SCJ could not be visualized. Cervical os too stenotic for ECC. No path obtained. Planned for repeat pap in 1 year.   She presents today for pap. No other complaints.   Dysplasia Hx: Cone biopsy at 72yo 10/09/13 AGC, HPV+ >> Inadequate colpo. EMB & ECC benign. 11/29/13 LEEP w/ CIN1, negative margins 07/20/14 LSIL/HPV+ >> 08/15/14 CIN1, ECC benign 01/11/15 ASCUS/HPV+ >> 03/04/15 CIN1, ECC benign 01/06/16 ASCUS/HPV+ >> 03/04/16 benign biopsy & ECC 01/26/17 LSIL/HPV+ 01/31/19 ASCUS/HPV+ 02/01/20 NILM/HPV+ >> vaginal biopsy LSIL/VAIN1 02/04/21 NILM/HPV+ >> colpo w/ no biopsies as above  Objective:   Vitals:   04/29/22 1011  BP: (!) 159/77  Pulse: 64  Resp: 16  Weight: 171 lb (77.6 kg)  Height: '5\' 5"'$  (1.651 m)   General:  Alert, oriented and cooperative. Patient is in no acute distress.  Skin: Skin is warm and dry. No rash noted.   Cardiovascular: Normal heart rate noted  Respiratory: Normal respiratory effort, no problems with respiration noted  Abdomen: Soft, nontender  Pelvic: NEFG. Atrophic epithelium. Cervix small w/ stenotic os. Pap w/ HRHPV collected. Exam performed in presence of a chaperone.   Assessment and Plan:  Carolyn [name] is a 72 y.o. with longstanding HPV infection and cervical dysplasia presenting for repeat pap   1. HPV infection, screening for cervical cancer - Cytology - PAP( [place]) - Reviewed that persistent HPV is associated with development of cervical cancer - Discussed that tobacco use is likely contributing to persistent HPV infection and that quitting smoking may help. She has no plans to quit and is not interested in quitting - has been smoking since she was a teenager.   - We had a frank discussion about cervical cancer and utility of continued screening/colposcopies/procedures given her age and health. We discussed that we will likely continue this cycle of pap/colpo indefinitely given her persistent HPV. Offered repeat LEEP/cone procedure or colpo at same time as pap. She would prefer to avoid these procedures UNLESS her cytology is concerning for a high grade lesion or cancer. We discussed that paps are a SCREENING test and risk of CIN3+ is >4% if persistent HPV+, even if pap is normal or low grade - we could potentially miss or delay diagnosis. She is accepting of this risk - After discussion, she would like to continue surveillance with annual pap IF HPV+ and low grade cytology or less. She is open to further discussion if pap consistent with high grade lesion or malignancy.   Return in about 1 year (around 04/30/2023) for pap.  Future Appointments  Date Time Provider [place]  05/04/2022 10:00 AM [facility] NL VASC 4 [facility]SECVI [facility]  05/18/2022 11:10 AM [name], DO [facility]PC[facility] None  10/19/2022 10:30 AM [facility] ECHO 4 [id number]  10/20/2022  1:45 PM Ward Givens, NP [facility]NA None   Inez Catalina, MD

## 2022-05-04 ENCOUNTER — Ambulatory Visit (HOSPITAL_COMMUNITY)
Admission: RE | Admit: 2022-05-04 | Discharge: 2022-05-04 | Disposition: A | Discharge status: Home / Self Care | Payer: No Typology Code available for payment source | Source: Ambulatory Visit | Attending: Cardiovascular Disease | Admitting: Cardiovascular Disease

## 2022-05-04 DIAGNOSIS — R0989 Other specified symptoms and signs involving the circulatory and respiratory systems: Secondary | ICD-10-CM | POA: Diagnosis not present

## 2022-05-04 LAB — CYTOLOGY - PAP
Comment: NEGATIVE
Diagnosis: NEGATIVE
High risk HPV: NEGATIVE

## 2022-05-18 ENCOUNTER — Ambulatory Visit (INDEPENDENT_AMBULATORY_CARE_PROVIDER_SITE_OTHER): Payer: No Typology Code available for payment source | Admitting: Family Medicine

## 2022-05-18 ENCOUNTER — Encounter: Payer: Self-pay | Admitting: Family Medicine

## 2022-05-18 VITALS — BP 140/75 | HR 67 | Ht 60.0 in | Wt 167.0 lb

## 2022-05-18 DIAGNOSIS — F17200 Nicotine dependence, unspecified, uncomplicated: Secondary | ICD-10-CM

## 2022-05-18 DIAGNOSIS — G2581 Restless legs syndrome: Secondary | ICD-10-CM

## 2022-05-18 DIAGNOSIS — E611 Iron deficiency: Secondary | ICD-10-CM | POA: Diagnosis not present

## 2022-05-18 DIAGNOSIS — E039 Hypothyroidism, unspecified: Secondary | ICD-10-CM | POA: Diagnosis not present

## 2022-05-18 DIAGNOSIS — J449 Chronic obstructive pulmonary disease, unspecified: Secondary | ICD-10-CM

## 2022-05-18 DIAGNOSIS — G4734 Idiopathic sleep related nonobstructive alveolar hypoventilation: Secondary | ICD-10-CM | POA: Diagnosis not present

## 2022-05-18 NOTE — Assessment & Plan Note (Signed)
Feels good with current levothyroxine strength.  Update TSH.

## 2022-05-18 NOTE — Assessment & Plan Note (Signed)
Continues to do well with Lyrica and Requip.  Updating iron levels.

## 2022-05-18 NOTE — Assessment & Plan Note (Signed)
She continues to smoke regularly.  Counseled on cessation however does not want to quit at this time.  She declines lung cancer screening.

## 2022-05-18 NOTE — Progress Notes (Signed)
Carolyn [name] - 72 y.o. female MRN [id number]  Date of birth: 10-Jul-1950  Subjective Chief Complaint  Patient presents with   Follow-up    HPI Carolyn [name] is a 72 y.o. female here today for follow up visit.   She reports that she is doing pretty good please.    Continues on breo daily for maintenance of COPD.  She does continue to smoke.  She has no interest in quitting.  She would not be interested in screening for lung cancer.  Her watch has alerted her that her oxygen saturation does drop at night.   Doing well with current strength of levothyroxine. Due for updated labs.   Seen by neurology previously for RLS.  On lyrica in addition to requip for management.  She reports that she continues to do pretty well with this.  No significant side effects.    ROS:  A comprehensive ROS was completed and negative except as noted per HPI  Allergies  Allergen Reactions   Trazodone And Nefazodone Shortness Of Breath   Azithromycin Rash    Past Medical History:  Diagnosis Date   BCC (basal cell carcinoma) 08/05/2015   left neck   BCC (basal cell carcinoma) infilt 02/20/2016   right ala nasal   BCC (basal cell carcinoma) sup&nod 06/03/2016   left lower sternum   BCC (basal cell carcinoma) ulcerated 03/19/2016   mid chest between breasts   Cervical cancer (HCC)    Cervical cancer (HCC)    COPD (chronic obstructive pulmonary disease) ([place])    Depression    Hyperlipidemia    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/05/2014   right shoulder blade   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   left nare   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   left upper shin   SCC (squamous cell carcinoma) in situ 03/19/2016   right pinky   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   right thigh, left upper shin   SCC (squamous cell carcinoma) well diff 12/13/2008   left hand   SCC (squamous cell  carcinoma) well diff x2 12/25/2015   left and right forearm   Thyroid disease     Past Surgical History:  Procedure Laterality Date   CERVICAL CONIZATION W/BX     EYE SURGERY Bilateral    Cataracts   MELANOMA EXCISION  08/2014   back   SHOULDER SURGERY Right     Social History   Socioeconomic History   Marital status: Divorced    Spouse name: Not on file   Number of children: 2   Years of education: Not on file   Highest education level: GED or equivalent  Occupational History   Occupation: disabled  Tobacco Use   Smoking status: Every Day    Packs/day: 0.50    Types: Cigarettes    Start date: 04/06/1968    Passive exposure: Never   Smokeless tobacco: Never  Vaping Use   Vaping Use: Never used  Substance and Sexual Activity   Alcohol use: Never   Drug use: No   Sexual activity: Not on file  Other Topics Concern   Not on file  Social History Narrative   Lives alone   R handed   Caffeine: half a litter of soda in a day.    Social Determinants of Health   Financial Resource Strain: Low Risk  (05/12/2018)  Overall Financial Resource Strain (CARDIA)    Difficulty of Paying Living Expenses: Not hard at all  Food Insecurity: No Food Insecurity (05/12/2018)   Hunger Vital Sign    Worried About Running Out of Food in the Last Year: Never true    [place] in the Last Year: Never true  Transportation Needs: No Transportation Needs (05/12/2018)   PRAPARE - Hydrologist (Medical): No    Lack of Transportation (Non-Medical): No  Physical Activity: Inactive (05/12/2018)   Exercise Vital Sign    Days of Exercise per Week: 0 days    Minutes of Exercise per Session: 0 min  Stress: No Stress Concern Present (05/12/2018)   [place]    Feeling of Stress : Not at all  Social Connections: Somewhat Isolated (05/12/2018)   Social Connection and Isolation Panel [NHANES]    Frequency of  Communication with Friends and Family: More than three times a week    Frequency of Social Gatherings with Friends and Family: More than three times a week    Attends Religious Services: More than 4 times per year    Active Member of [employer] or Organizations: No    Attends Archivist Meetings: Never    Marital Status: Divorced    Family History  Problem Relation Age of Onset   Heart disease Mother    Aneurysm Mother    Heart disease Father    Kidney failure Father    Cancer Sister    Cancer Brother    Cirrhosis Daughter    Cancer Sister        Bronchial    Health Maintenance  Topic Date Due   Medicare Annual Wellness (AWV)  05/09/2021   COVID-19 Vaccine (6 - 2023-24 season) 06/03/2022 (Originally 12/05/2021)   Pneumonia Vaccine 35+ Years old (81 of 3 - PPSV23 or PCV20) 07/16/2022 (Originally 01/21/2019)   Lung Cancer Screening  05/19/2023 (Originally 01/06/2019)   MAMMOGRAM  01/23/2024   COLONOSCOPY (Pts 45-4yr Insurance coverage will need to be confirmed)  09/10/2025   DTaP/Tdap/Td (3 - Td or Tdap) 01/04/2031   INFLUENZA VACCINE  Completed   DEXA SCAN  Completed   Hepatitis C Screening  Completed   Zoster Vaccines- Shingrix  Completed   HPV VACCINES  Aged Out     ----------------------------------------------------------------------------------------------------------------------------------------------------------------------------------------------------------------- Physical Exam BP (!) 140/75 (BP Location: Left Arm, Patient Position: Sitting, Cuff Size: Normal)   Pulse 67   Ht 5' (1.524 m)   Wt 167 lb 0.6 oz (75.8 kg)   SpO2 99%   BMI 32.62 kg/m   Physical Exam Constitutional:      Appearance: Normal appearance.  HENT:     Head: Normocephalic and atraumatic.  Eyes:     General: No scleral icterus. Cardiovascular:     Rate and Rhythm: Normal rate and regular rhythm.  Pulmonary:     Effort: Pulmonary effort is normal.     Breath sounds: Normal  breath sounds.  Musculoskeletal:     Cervical back: Neck supple.  Neurological:     Mental Status: She is alert.  Psychiatric:        Mood and Affect: Mood normal.        Behavior: Behavior normal.     ------------------------------------------------------------------------------------------------------------------------------------------------------------------------------------------------------------------- Assessment and Plan  Nocturnal oxygen desaturation Her smart watch has alerted her to nighttime oxygen desaturations.  Home sleep study ordered.  Tobacco use disorder She continues to smoke regularly.  Counseled on cessation however does not want to quit at this time.  She declines lung cancer screening.  Hypothyroidism Feels good with current levothyroxine strength.  Update TSH.  COPD (chronic obstructive pulmonary disease) (HCC) Continue Breo at current strength.  Restless leg syndrome Continues to do well with Lyrica and Requip.  Updating iron levels.   No orders of the defined types were placed in this encounter.   Return in about 6 months (around 11/16/2022) for F/u COPD.    This visit occurred during the SARS-CoV-2 public health emergency.  Safety protocols were in place, including screening questions prior to the visit, additional usage of staff PPE, and extensive cleaning of exam room while observing appropriate contact time as indicated for disinfecting solutions.

## 2022-05-18 NOTE — Patient Instructions (Signed)
Let me know if you decide to have lung cancer screening.  We'll be in touch with lab results.

## 2022-05-18 NOTE — Assessment & Plan Note (Signed)
Her smart watch has alerted her to nighttime oxygen desaturations.  Home sleep study ordered.

## 2022-05-18 NOTE — Assessment & Plan Note (Signed)
Continue Breo at current strength.

## 2022-05-19 LAB — IRON,TIBC AND FERRITIN PANEL
%SAT: 22 % (calc) (ref 16–45)
Ferritin: 34 ng/mL (ref 16–288)
Iron: 87 ug/dL (ref 45–160)
TIBC: 391 mcg/dL (calc) (ref 250–450)

## 2022-05-19 LAB — CBC WITH DIFFERENTIAL/PLATELET
Absolute Monocytes: 363 cells/uL (ref 200–950)
Basophils Absolute: 72 cells/uL (ref 0–200)
Basophils Relative: 1.3 %
Eosinophils Absolute: 99 cells/uL (ref 15–500)
Eosinophils Relative: 1.8 %
HCT: 43.4 % (ref 35.0–45.0)
Hemoglobin: 15 g/dL (ref 11.7–15.5)
Lymphs Abs: 1348 cells/uL (ref 850–3900)
MCH: 30.5 pg (ref 27.0–33.0)
MCHC: 34.6 g/dL (ref 32.0–36.0)
MCV: 88.2 fL (ref 80.0–100.0)
MPV: 10.7 fL (ref 7.5–12.5)
Monocytes Relative: 6.6 %
Neutro Abs: 3619 cells/uL (ref 1500–7800)
Neutrophils Relative %: 65.8 %
Platelets: 188 10*3/uL (ref 140–400)
RBC: 4.92 10*6/uL (ref 3.80–5.10)
RDW: 13.3 % (ref 11.0–15.0)
Total Lymphocyte: 24.5 %
WBC: 5.5 10*3/uL (ref 3.8–10.8)

## 2022-05-19 LAB — COMPLETE METABOLIC PANEL WITH GFR
AG Ratio: 1.8 (calc) (ref 1.0–2.5)
ALT: 22 U/L (ref 6–29)
AST: 29 U/L (ref 10–35)
Albumin: 4.3 g/dL (ref 3.6–5.1)
Alkaline phosphatase (APISO): 72 U/L (ref 37–153)
BUN: 14 mg/dL (ref 7–25)
CO2: 27 mmol/L (ref 20–32)
Calcium: 9.8 mg/dL (ref 8.6–10.4)
Chloride: 104 mmol/L (ref 98–110)
Creat: 0.7 mg/dL (ref 0.50–1.05)
Globulin: 2.4 g/dL (calc) (ref 1.9–3.7)
Glucose, Bld: 85 mg/dL (ref 65–99)
Potassium: 4.4 mmol/L (ref 3.5–5.3)
Sodium: 140 mmol/L (ref 135–146)
Total Bilirubin: 0.7 mg/dL (ref 0.2–1.2)
Total Protein: 6.7 g/dL (ref 6.1–8.1)
eGFR: 94 mL/min/{1.73_m2} (ref >=60)

## 2022-05-19 LAB — TSH: TSH: 3.5 mIU/L (ref 0.40–4.50)

## 2022-05-21 ENCOUNTER — Ambulatory Visit (INDEPENDENT_AMBULATORY_CARE_PROVIDER_SITE_OTHER): Payer: No Typology Code available for payment source | Admitting: Sports Medicine

## 2022-05-21 DIAGNOSIS — M722 Plantar fascial fibromatosis: Secondary | ICD-10-CM | POA: Diagnosis not present

## 2022-05-21 NOTE — Progress Notes (Signed)
    Procedures performed today:    None.  Independent interpretation of notes and tests performed by another provider:   None.  Brief History, Exam, Impression, and Recommendations:    Plantar fasciitis, right Several weeks of increasing pain plantar heel right-sided as well as pain at the fifth MTP. She does a lot of barefoot walking. On exam she has pes cavus, tenderness to the plantar fascial origin, as well as a painful fifth MTP callus. She will avoid this, we will get her custom orthotics, she has ibuprofen that she will take at night, home physical therapy given. I would like her to see the podiatrist downstairs regarding her painful fifth MTP callus and bunionette. Return to see me in 4 weeks, injection if not better.    ____________________________________________ Gwen Her. Dianah Field, M.D., ABFM., CAQSM., AME. Primary Care and Sports Medicine [facility]  Adjunct Professor of [place] [employer] [facility] of Medicine  Risk manager

## 2022-05-21 NOTE — Assessment & Plan Note (Signed)
Several weeks of increasing pain plantar heel right-sided as well as pain at the fifth MTP. She does a lot of barefoot walking. On exam she has pes cavus, tenderness to the plantar fascial origin, as well as a painful fifth MTP callus. She will avoid this, we will get her custom orthotics, she has ibuprofen that she will take at night, home physical therapy given. I would like her to see the podiatrist downstairs regarding her painful fifth MTP callus and bunionette. Return to see me in 4 weeks, injection if not better.

## 2022-05-24 ENCOUNTER — Other Ambulatory Visit: Payer: Self-pay | Admitting: Family Medicine

## 2022-05-24 DIAGNOSIS — G8929 Other chronic pain: Secondary | ICD-10-CM

## 2022-05-25 NOTE — Telephone Encounter (Signed)
Last appt was on 05/18/22  Last RX sent :  Celebrex - 04/10/2022                Klonopin-  04/24/2022

## 2022-05-29 ENCOUNTER — Telehealth: Payer: Self-pay | Admitting: Cardiology

## 2022-05-29 ENCOUNTER — Ambulatory Visit (INDEPENDENT_AMBULATORY_CARE_PROVIDER_SITE_OTHER): Payer: No Typology Code available for payment source

## 2022-05-29 ENCOUNTER — Encounter: Payer: Self-pay | Admitting: Podiatry

## 2022-05-29 ENCOUNTER — Ambulatory Visit (INDEPENDENT_AMBULATORY_CARE_PROVIDER_SITE_OTHER): Payer: No Typology Code available for payment source | Admitting: Podiatry

## 2022-05-29 DIAGNOSIS — M79671 Pain in right foot: Secondary | ICD-10-CM

## 2022-05-29 DIAGNOSIS — M722 Plantar fascial fibromatosis: Secondary | ICD-10-CM | POA: Diagnosis not present

## 2022-05-29 DIAGNOSIS — Z9889 Other specified postprocedural states: Secondary | ICD-10-CM | POA: Diagnosis not present

## 2022-05-29 DIAGNOSIS — M7731 Calcaneal spur, right foot: Secondary | ICD-10-CM | POA: Diagnosis not present

## 2022-05-29 MED ORDER — DEXAMETHASONE SODIUM PHOSPHATE 120 MG/30ML IJ SOLN
4.0000 mg | Freq: Once | INTRAMUSCULAR | Status: AC
  Administered 2022-05-29: 4 mg via INTRA_ARTICULAR

## 2022-05-29 MED ORDER — MELOXICAM 15 MG PO TABS: 15.0000 mg | ORAL_TABLET | Freq: Every day | ORAL | 0 refills | Status: DC

## 2022-05-29 NOTE — Patient Instructions (Signed)

## 2022-05-29 NOTE — Progress Notes (Signed)
Subjective:  Patient ID: Carolyn [name], female    DOB: Aug 27, 1950,   MRN: [id number]  Chief Complaint  Patient presents with   Plantar Fasciitis    Right heel possible PF pain ongoing for while now. Patient states pain is constant , shooting and throbbing     72 y.o. female presents for concern of right heel pain that has been going on for about three weeks but does have a history of plantar fasciitis and achilles issues. Relates she has been using icy hot patch and stretching when she can on this foot. She works at [employer] on her feet all day. Relates pain is in the bottom of her heel and hurst most after getting up from rest.  . Denies any other pedal complaints. Denies n/v/f/c.   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 CFT <3 seconds. Normal hair growth on digits. No edema.  Skin. No lacerations or abrasions bilateral feet.   Musculoskeletal: MMT 5/5 bilateral lower extremities in DF, PF, Inversion and Eversion. Deceased ROM in DF of ankle joint. Tender to medial calcaneal tubercle on the right. No pain to calcaneal squeeze. No pain along PT or achilles tendon today.  Neurological: Sensation intact to light touch.   Assessment:   1. Plantar fasciitis of right foot      Plan:  Patient was evaluated and treated and all questions answered. Discussed plantar fasciitis with patient.  X-rays reviewed and discussed with patient. No acute fractures or dislocations noted. Mild spurring noted at inferior calcaneus.  Reviewd previous notes from sports medicine.  Discussed treatment options including, ice, NSAIDS, supportive shoes, bracing, and stretching. Stretching exercises provided to be done on a daily basis.   Prescription for meloxicam provided and sent to pharmacy. Patient requesting injection today. Procedure note below.   PF brace dispensed.  Follow-up 6 weeks or sooner if any problems arise. In the meantime, encouraged to call the office with any questions, concerns, change in symptoms.   Procedure:  Discussed etiology, pathology, conservative vs. surgical therapies. At this time a plantar fascial injection was recommended.  The patient agreed and a sterile skin prep was applied.  An injection consisting of  dexamethasone and marcaine mixture was infiltrated at the point of maximal tenderness on the right Heel.  Bandaid applied. The patient tolerated this well and was given instructions for aftercare.     Lorenda Peck, DPM

## 2022-05-29 NOTE — Telephone Encounter (Signed)
Pt stated she had presyncope yesterday while at work, along with a brief sharp chest pain. This morning, she reported lightheadedness. She is staying hydrated. While on phone, BP 151/77, P 76. She stated her P and O2 drop at night. Her PCP is planning for a sleep study. No APP available next week. Recommended she contact PCP regarding symptoms and sleep study. Recommended that if she has chest pain again or lightheadedness, to call 911. She verbalized understanding. Also gave the her cardiology on-call number.

## 2022-05-29 NOTE — Telephone Encounter (Signed)
STAT if HR is under 50 or over 120 (normal HR is 60-100 beats per minute)  What is your heart rate?  74 now last night it was in the fifties  Do you have a log of your heart rate readings (document readings)?   Do you have any other symptoms? Light chest pains last night, not at this exact time, yesterday morning so lightheaded, almost passed out

## 2022-05-29 NOTE — Telephone Encounter (Signed)
Spoke with pt, Follow up scheduled  

## 2022-06-01 ENCOUNTER — Inpatient Hospital Stay (HOSPITAL_COMMUNITY)
Admission: EM | Admit: 2022-06-01 | Discharge: 2022-06-03 | DRG: 286 | Disposition: A | Discharge status: Home / Self Care | Payer: No Typology Code available for payment source | Attending: Cardiovascular Disease | Admitting: Cardiovascular Disease

## 2022-06-01 ENCOUNTER — Other Ambulatory Visit: Payer: Self-pay

## 2022-06-01 ENCOUNTER — Encounter (HOSPITAL_COMMUNITY): Payer: Self-pay | Admitting: Cardiovascular Disease

## 2022-06-01 ENCOUNTER — Emergency Department (HOSPITAL_COMMUNITY): Payer: No Typology Code available for payment source

## 2022-06-01 DIAGNOSIS — Z79899 Other long term (current) drug therapy: Secondary | ICD-10-CM | POA: Diagnosis not present

## 2022-06-01 DIAGNOSIS — Z8249 Family history of ischemic heart disease and other diseases of the circulatory system: Secondary | ICD-10-CM

## 2022-06-01 DIAGNOSIS — R0902 Hypoxemia: Secondary | ICD-10-CM | POA: Diagnosis not present

## 2022-06-01 DIAGNOSIS — F17200 Nicotine dependence, unspecified, uncomplicated: Secondary | ICD-10-CM | POA: Diagnosis present

## 2022-06-01 DIAGNOSIS — R079 Chest pain, unspecified: Secondary | ICD-10-CM | POA: Diagnosis not present

## 2022-06-01 DIAGNOSIS — Q231 Congenital insufficiency of aortic valve: Secondary | ICD-10-CM

## 2022-06-01 DIAGNOSIS — R0789 Other chest pain: Secondary | ICD-10-CM | POA: Diagnosis not present

## 2022-06-01 DIAGNOSIS — Z86008 Personal history of in-situ neoplasm of other site: Secondary | ICD-10-CM

## 2022-06-01 DIAGNOSIS — Z841 Family history of disorders of kidney and ureter: Secondary | ICD-10-CM | POA: Diagnosis not present

## 2022-06-01 DIAGNOSIS — I10 Essential (primary) hypertension: Secondary | ICD-10-CM | POA: Diagnosis not present

## 2022-06-01 DIAGNOSIS — Z79890 Hormone replacement therapy (postmenopausal): Secondary | ICD-10-CM

## 2022-06-01 DIAGNOSIS — R55 Syncope and collapse: Secondary | ICD-10-CM | POA: Diagnosis present

## 2022-06-01 DIAGNOSIS — Z881 Allergy status to other antibiotic agents status: Secondary | ICD-10-CM

## 2022-06-01 DIAGNOSIS — I5031 Acute diastolic (congestive) heart failure: Secondary | ICD-10-CM | POA: Diagnosis not present

## 2022-06-01 DIAGNOSIS — E785 Hyperlipidemia, unspecified: Secondary | ICD-10-CM | POA: Diagnosis not present

## 2022-06-01 DIAGNOSIS — Z85828 Personal history of other malignant neoplasm of skin: Secondary | ICD-10-CM | POA: Diagnosis not present

## 2022-06-01 DIAGNOSIS — Z7982 Long term (current) use of aspirin: Secondary | ICD-10-CM

## 2022-06-01 DIAGNOSIS — I440 Atrioventricular block, first degree: Secondary | ICD-10-CM | POA: Diagnosis present

## 2022-06-01 DIAGNOSIS — F419 Anxiety disorder, unspecified: Secondary | ICD-10-CM | POA: Diagnosis not present

## 2022-06-01 DIAGNOSIS — G2581 Restless legs syndrome: Secondary | ICD-10-CM | POA: Diagnosis present

## 2022-06-01 DIAGNOSIS — Z8541 Personal history of malignant neoplasm of cervix uteri: Secondary | ICD-10-CM | POA: Diagnosis not present

## 2022-06-01 DIAGNOSIS — Z888 Allergy status to other drugs, medicaments and biological substances status: Secondary | ICD-10-CM | POA: Diagnosis not present

## 2022-06-01 DIAGNOSIS — Z720 Tobacco use: Secondary | ICD-10-CM | POA: Diagnosis present

## 2022-06-01 DIAGNOSIS — Z85820 Personal history of malignant melanoma of skin: Secondary | ICD-10-CM

## 2022-06-01 DIAGNOSIS — I200 Unstable angina: Secondary | ICD-10-CM

## 2022-06-01 DIAGNOSIS — I350 Nonrheumatic aortic (valve) stenosis: Principal | ICD-10-CM

## 2022-06-01 DIAGNOSIS — J9811 Atelectasis: Secondary | ICD-10-CM | POA: Diagnosis not present

## 2022-06-01 DIAGNOSIS — Z7951 Long term (current) use of inhaled steroids: Secondary | ICD-10-CM | POA: Diagnosis not present

## 2022-06-01 DIAGNOSIS — R0609 Other forms of dyspnea: Secondary | ICD-10-CM | POA: Diagnosis present

## 2022-06-01 DIAGNOSIS — Q2112 Patent foramen ovale: Secondary | ICD-10-CM

## 2022-06-01 DIAGNOSIS — K7689 Other specified diseases of liver: Secondary | ICD-10-CM | POA: Diagnosis not present

## 2022-06-01 DIAGNOSIS — R531 Weakness: Secondary | ICD-10-CM | POA: Diagnosis not present

## 2022-06-01 DIAGNOSIS — K8689 Other specified diseases of pancreas: Secondary | ICD-10-CM | POA: Diagnosis not present

## 2022-06-01 DIAGNOSIS — Z791 Long term (current) use of non-steroidal anti-inflammatories (NSAID): Secondary | ICD-10-CM | POA: Diagnosis not present

## 2022-06-01 DIAGNOSIS — E039 Hypothyroidism, unspecified: Secondary | ICD-10-CM | POA: Diagnosis present

## 2022-06-01 DIAGNOSIS — R911 Solitary pulmonary nodule: Secondary | ICD-10-CM | POA: Diagnosis not present

## 2022-06-01 DIAGNOSIS — J449 Chronic obstructive pulmonary disease, unspecified: Secondary | ICD-10-CM | POA: Diagnosis not present

## 2022-06-01 DIAGNOSIS — I959 Hypotension, unspecified: Secondary | ICD-10-CM | POA: Diagnosis not present

## 2022-06-01 DIAGNOSIS — I351 Nonrheumatic aortic (valve) insufficiency: Secondary | ICD-10-CM | POA: Diagnosis not present

## 2022-06-01 LAB — CBC
HCT: 45.2 % (ref 36.0–46.0)
HCT: 42.2 % (ref 36.0–46.0)
Hemoglobin: 15.1 g/dL — ABNORMAL HIGH (ref 12.0–15.0)
Hemoglobin: 14.3 g/dL (ref 12.0–15.0)
MCH: 29.9 pg (ref 26.0–34.0)
MCH: 29.9 pg (ref 26.0–34.0)
MCHC: 33.4 g/dL (ref 30.0–36.0)
MCHC: 33.9 g/dL (ref 30.0–36.0)
MCV: 89.5 fL (ref 80.0–100.0)
MCV: 88.3 fL (ref 80.0–100.0)
Platelets: 180 10*3/uL (ref 150–400)
Platelets: 173 10*3/uL (ref 150–400)
RBC: 5.05 MIL/uL (ref 3.87–5.11)
RBC: 4.78 MIL/uL (ref 3.87–5.11)
RDW: 14 % (ref 11.5–15.5)
RDW: 13.9 % (ref 11.5–15.5)
WBC: 9 10*3/uL (ref 4.0–10.5)
WBC: 7.8 10*3/uL (ref 4.0–10.5)
nRBC: 0 % (ref 0.0–0.2)
nRBC: 0 % (ref 0.0–0.2)

## 2022-06-01 LAB — CREATININE, SERUM
Creatinine, Ser: 0.81 mg/dL (ref 0.44–1.00)
GFR, Estimated: >60 mL/min (ref >60)

## 2022-06-01 LAB — COMPREHENSIVE METABOLIC PANEL
ALT: 26 U/L (ref 0–44)
AST: 29 U/L (ref 15–41)
Albumin: 3.7 g/dL (ref 3.5–5.0)
Alkaline Phosphatase: 70 U/L (ref 38–126)
Anion gap: 8 (ref 5–15)
BUN: 17 mg/dL (ref 8–23)
CO2: 25 mmol/L (ref 22–32)
Calcium: 9.2 mg/dL (ref 8.9–10.3)
Chloride: 102 mmol/L (ref 98–111)
Creatinine, Ser: 0.9 mg/dL (ref 0.44–1.00)
GFR, Estimated: >60 mL/min (ref >60)
Glucose, Bld: 98 mg/dL (ref 70–99)
Potassium: 4.1 mmol/L (ref 3.5–5.1)
Sodium: 135 mmol/L (ref 135–145)
Total Bilirubin: 0.7 mg/dL (ref 0.3–1.2)
Total Protein: 6.3 g/dL — ABNORMAL LOW (ref 6.5–8.1)

## 2022-06-01 LAB — TROPONIN I (HIGH SENSITIVITY)
Troponin I (High Sensitivity): 8 ng/L (ref <18)
Troponin I (High Sensitivity): 6 ng/L (ref <18)

## 2022-06-01 LAB — TSH: TSH: 8.745 u[IU]/mL — ABNORMAL HIGH (ref 0.350–4.500)

## 2022-06-01 LAB — BRAIN NATRIURETIC PEPTIDE: B Natriuretic Peptide: 212.5 pg/mL — ABNORMAL HIGH (ref 0.0–100.0)

## 2022-06-01 LAB — HIV ANTIBODY (ROUTINE TESTING W REFLEX): HIV Screen 4th Generation wRfx: NONREACTIVE

## 2022-06-01 LAB — T4, FREE: Free T4: 1.29 ng/dL — ABNORMAL HIGH (ref 0.61–1.12)

## 2022-06-01 MED ORDER — SODIUM CHLORIDE 0.9% FLUSH
3.0000 mL | Freq: Two times a day (BID) | INTRAVENOUS | Status: DC
  Administered 2022-06-02: 3 mL via INTRAVENOUS

## 2022-06-01 MED ORDER — PREGABALIN 75 MG PO CAPS
75.0000 mg | ORAL_CAPSULE | Freq: Every day | ORAL | Status: DC
  Administered 2022-06-01 – 2022-06-03 (×3): 75 mg via ORAL
  Filled 2022-06-01 (×3): qty 1

## 2022-06-01 MED ORDER — ROPINIROLE HCL 0.5 MG PO TABS
3.0000 mg | ORAL_TABLET | Freq: Every day | ORAL | Status: DC
  Administered 2022-06-01 – 2022-06-02 (×2): 3 mg via ORAL
  Filled 2022-06-01 (×2): qty 6

## 2022-06-01 MED ORDER — LEVOTHYROXINE SODIUM 100 MCG PO TABS
100.0000 ug | ORAL_TABLET | Freq: Every day | ORAL | Status: DC
  Administered 2022-06-02 – 2022-06-03 (×2): 100 ug via ORAL
  Filled 2022-06-01 (×2): qty 1

## 2022-06-01 MED ORDER — CLONAZEPAM 0.5 MG PO TABS
0.5000 mg | ORAL_TABLET | Freq: Every evening | ORAL | Status: DC | PRN
  Administered 2022-06-01 – 2022-06-02 (×2): 0.5 mg via ORAL
  Filled 2022-06-01 (×2): qty 1

## 2022-06-01 MED ORDER — SODIUM CHLORIDE 0.9% FLUSH: 3.0000 mL | INTRAVENOUS | Status: DC | PRN

## 2022-06-01 MED ORDER — CLONAZEPAM 0.5 MG PO TABS: 0.5000 mg | ORAL_TABLET | Freq: Every day | ORAL | Status: DC

## 2022-06-01 MED ORDER — SODIUM CHLORIDE 0.9 % IV SOLN: INTRAVENOUS | Status: DC

## 2022-06-01 MED ORDER — ACETAMINOPHEN 325 MG PO TABS: 650.0000 mg | ORAL_TABLET | ORAL | Status: DC | PRN

## 2022-06-01 MED ORDER — BACLOFEN 10 MG PO TABS
10.0000 mg | ORAL_TABLET | Freq: Three times a day (TID) | ORAL | Status: DC
  Administered 2022-06-01 – 2022-06-03 (×5): 10 mg via ORAL
  Filled 2022-06-01 (×5): qty 1

## 2022-06-01 MED ORDER — FLUTICASONE FUROATE-VILANTEROL 100-25 MCG/ACT IN AEPB
1.0000 | INHALATION_SPRAY | Freq: Every day | RESPIRATORY_TRACT | Status: DC
  Administered 2022-06-02 – 2022-06-03 (×2): 1 via RESPIRATORY_TRACT
  Filled 2022-06-01: qty 28

## 2022-06-01 MED ORDER — FENTANYL CITRATE PF 50 MCG/ML IJ SOSY: 25.0000 ug | PREFILLED_SYRINGE | INTRAMUSCULAR | Status: DC | PRN

## 2022-06-01 MED ORDER — ONDANSETRON HCL 4 MG/2ML IJ SOLN: 4.0000 mg | Freq: Four times a day (QID) | INTRAMUSCULAR | Status: DC | PRN

## 2022-06-01 MED ORDER — FUROSEMIDE 10 MG/ML IJ SOLN
20.0000 mg | Freq: Once | INTRAMUSCULAR | Status: AC
  Administered 2022-06-01: 20 mg via INTRAVENOUS
  Filled 2022-06-01: qty 2

## 2022-06-01 MED ORDER — HEPARIN SODIUM (PORCINE) 5000 UNIT/ML IJ SOLN
5000.0000 [IU] | Freq: Three times a day (TID) | INTRAMUSCULAR | Status: DC
  Administered 2022-06-01 (×2): 5000 [IU] via SUBCUTANEOUS
  Filled 2022-06-01 (×3): qty 1

## 2022-06-01 MED ORDER — NITROGLYCERIN 2 % TD OINT
0.5000 [in_us] | TOPICAL_OINTMENT | Freq: Once | TRANSDERMAL | Status: AC
  Administered 2022-06-01: 0.5 [in_us] via TOPICAL
  Filled 2022-06-01: qty 1

## 2022-06-01 MED ORDER — ATORVASTATIN CALCIUM 10 MG PO TABS
10.0000 mg | ORAL_TABLET | Freq: Every day | ORAL | Status: DC
  Administered 2022-06-02 – 2022-06-03 (×2): 10 mg via ORAL
  Filled 2022-06-01 (×3): qty 1

## 2022-06-01 MED ORDER — ASPIRIN 81 MG PO CHEW
81.0000 mg | CHEWABLE_TABLET | ORAL | Status: AC
  Administered 2022-06-02: 81 mg via ORAL
  Filled 2022-06-01: qty 1

## 2022-06-01 MED ORDER — SODIUM CHLORIDE 0.9 % IV SOLN: 250.0000 mL | INTRAVENOUS | Status: DC | PRN

## 2022-06-01 NOTE — ED Provider Notes (Signed)
  ED Course / MDM    Medical Decision Making Amount and/or Complexity of Data Reviewed Labs: ordered. Radiology: ordered.  Risk Prescription drug management. Decision regarding hospitalization.  Accepted handoff at shift change from resident physician Dr Jeani Hawking. Please see prior provider note for full HPI.  Briefly: Patient is a 72 y.o. female who presents to the ER for chest pain and near syncope. Hx of severe aortic stenosis.   CBC and CMP unremarkable. Initial troponin 8, delta troponin 6. TSH elevated to 8.745, hx of same.   DDX/Plan: Cardiology has been consulted. Patient with symptom control via nitroglycerin paste and tylenol. Suspect cardiology to admit for symptomatic aortic stenosis. If they feel comfortable discharging, would appreciate their recommendations.   [id number] -- Cardiologist Dr Oval Linsey will admit. The patient appears reasonably stabilized for admission considering the current resources, flow, and capabilities available in the ED at this time, and I doubt any other [facility] requiring further screening and/or treatment in the ED prior to admission.    Carolyn [name] 06/01/22 1621    Sherwood Gambler, MD 06/05/22 [phone]

## 2022-06-01 NOTE — H&P (Addendum)
Cardiology Consultation   Patient ID: Venitta Backes MRN: [id number]; DOB: 22-Jul-1950  Admit date: 06/01/2022 Date of Consult: 06/01/2022  PCP:  [name], DO   [facility] Prov[facility] Cardiologist:  Dr Stanford Breed   Patient Profile:   Carolyn [name] is a 72 y.o. female with a hx of COPD,  hypothyroidism, anxiety, severe aortic stenosis, HLD, tobacco use, RLS, who is being seen 06/01/2022 for the evaluation of chest pain at the request of Dr Doren Custard.  History of Present Illness:   Ms. Pacha with above PMH presented to ER today via EMS for chest pain and presyncope. She states she had intermittent chest pain with heaviness and tightness for the past week, starting Thursday. Pain is mid-sternum area, sharp pain, usually triggered by sanding up and walking. It has occurred few times at work.  She felt increasingly dizzy and want to pass out with these episodes. She normally tried to rest in the chair and symptoms would improve. She could not "shaked if off" and therefore called EMS. She states Nitro patch seems helping the pain currently. She is chest pain free now. She denied any SOB, PND, leg edema, or weight gain. She does not sleep flat, always uses 2 pillows because of comfort, denied SOB when bed is change to 0 degree. She denied hx of MI, CAD, CHF, arrhythmia, CKD, DM, CVA. She smokes 1.2 PPD, denied ETOH or illicit drugs use.   She saw Dr Stanford Breed on 04/20/22, was referred for evaluation of aortic stenosis by PCP. She was noted with heart murmur, Echo from 12/2021 showed LVEF 60-65%, no RWMA, grade I DD, normal RV, normal PASP, mod LAE, mild AI, moderate to severe AS with VTI measures 1.17 cm, mean gradient 39.0 mmHg. Aortic valve Vmax measures 4.09 m/s. Peak gradient 67 mmHg. DI is 0.37. Small PFO. She was able to function and remains active without significant dyspnea, chest pain, or syncope. She was advised to monitor symptoms and repeat Echo in 6 months, with potential  valve replacement in the future. Her BP was elevated at the office and she was told to monitor.   Admission diagnostic showed grossly unremarkable CMP and CBC. Hs trop negative 8 >6. TSH elevated to 8.7. CXR showed bibasilar patchy opacities, likely atelectasis. EKG showed sinus rhythm 62bpm, lateral TWI seems unchanged from 04/20/22 EKG. She was given nitro 0.5 inch at ED for chest pain.  Cardiology is consulted for further input.     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   left nare   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   left upper shin   SCC (squamous cell carcinoma) in situ 03/19/2016   right pinky   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 Authorizing Provider  aspirin 81 MG EC tablet TAKE 1 TABLET BY MOUTH EVERY DAY 11/11/18   Terald Sleeper, PA-C  atorvastatin (LIPITOR) 10 MG tablet TAKE 1 TABLET BY  MOUTH EVERY DAY 02/24/22   [name], DO  baclofen (LIORESAL) 10 MG tablet TAKE 1 TABLET BY MOUTH THREE TIMES A DAY 02/24/22   [name], DO  benzonatate (TESSALON) 100 MG capsule Take 1 capsule (100 mg total) by mouth every 8 (eight) hours. 03/24/22   Raylene Everts, MD  BREO ELLIPTA 100-25 MCG/ACT AEPB TAKE 1 PUFF BY MOUTH EVERY DAY 02/24/22   [name], DO  Calcium Carbonate (CALCIUM 600 PO) Take by mouth.    [provider]  clonazePAM (KLONOPIN) 0.5 MG tablet TAKE 1 TABLET BY MOUTH EVERYDAY AT BEDTIME 05/25/22   [name], DO  doxycycline (VIBRAMYCIN) 100 MG capsule Take 1 capsule (100 mg total) by mouth 2 (two) times daily. 03/24/22   Raylene Everts, MD  ferrous sulfate 325 (65 FE) MG EC tablet Take 1 tablet (325 mg total) by mouth daily with breakfast. 03/26/22   Genia Harold, MD  fluticasone (FLONASE) 50 MCG/ACT nasal spray INSTILL 1 SPRAY INTO BOTH NOSTRILS DAILY 04/10/22   [name], DO  Garlic 123XX123 MG CAPS Take by mouth.    [provider]  levothyroxine (SYNTHROID) 100 MCG tablet TAKE 1 TABLET BY MOUTH DAILY BEFORE BREAKFAST. 10/27/21   [name], DO  meloxicam (MOBIC) 15 MG tablet Take 1 tablet (15 mg total) by mouth daily. 05/29/22   Lorenda Peck, DPM  Multiple Vitamins-Minerals (CENTRUM SILVER ADULT 50+ PO) Take 1 tablet by mouth daily.    [provider]  Omega-3 Fatty Acids (FISH OIL) 1000 MG CAPS Take by mouth.    [provider]  pregabalin (LYRICA) 75 MG capsule Take 1 capsule (75 mg total) by mouth at bedtime as needed. 03/25/22   Genia Harold, MD  rOPINIRole (REQUIP) 3 MG tablet TAKE 1 TABLET BY MOUTH AT BEDTIME. 04/10/22   [name], DO    Inpatient Medications: Scheduled Meds:  heparin  5,000 Units Subcutaneous Q8H   Continuous Infusions:  PRN Meds: acetaminophen, fentaNYL (SUBLIMAZE) injection, ondansetron (ZOFRAN) IV  Allergies:    Allergies  Allergen Reactions   Trazodone And  Nefazodone Shortness Of Breath   Azithromycin Rash    Social History:   Social History   Socioeconomic History   Marital status: Divorced    Spouse name: Not on file   Number of children: 2   Years of education: Not on file   Highest education level: GED or equivalent  Occupational History   Occupation: disabled  Tobacco Use   Smoking status: Every Day    Packs/day: 0.50    Types: Cigarettes    Start date: 04/06/1968    Passive exposure: Never   Smokeless tobacco: Never  Vaping Use   Vaping Use: Never used  Substance and Sexual Activity   Alcohol use: Never   Drug use:  No   Sexual activity: Not on file  Other Topics Concern   Not on file  Social History Narrative   Lives alone   R handed   Caffeine: half a litter of soda in a day.    Social Determinants of Health   Financial Resource Strain: Low Risk  (05/12/2018)   Overall Financial Resource Strain (CARDIA)    Difficulty of Paying Living Expenses: Not hard at all  Food Insecurity: No Food Insecurity (05/12/2018)   Hunger Vital Sign    Worried About Running Out of Food in the Last Year: Never true    Ran Out of Food in the Last Year: Never true  Transportation Needs: No Transportation Needs (05/12/2018)   PRAPARE - Hydrologist (Medical): No    Lack of Transportation (Non-Medical): No  Physical Activity: Inactive (05/12/2018)   Exercise Vital Sign    Days of Exercise per Week: 0 days    Minutes of Exercise per Session: 0 min  Stress: No Stress Concern Present (05/12/2018)   [place]    Feeling of Stress : Not at all  Social Connections: Somewhat Isolated (05/12/2018)   Social Connection and Isolation Panel [NHANES]    Frequency of Communication with Friends and Family: More than three times a week    Frequency of Social Gatherings with Friends and Family: More than three times a week    Attends Religious Services: More than 4  times per year    Active Member of [employer] or Organizations: No    Attends Archivist Meetings: Never    Marital Status: Divorced  Human resources officer Violence: Not At Risk (05/12/2018)   Humiliation, Afraid, Rape, and Kick questionnaire    Fear of Current or Ex-Partner: No    Emotionally Abused: No    Physically Abused: No    Sexually Abused: No    Family History:    Family History  Problem Relation Age of Onset   Heart disease Mother    Aneurysm Mother    Heart disease Father    Kidney failure Father    Cancer Sister    Cancer Brother    Cirrhosis Daughter    Cancer Sister        Bronchial     ROS:  Constitutional: Denied fever, chills, malaise, night sweats Eyes: Denied vision change or loss Ears/Nose/Mouth/Throat: Denied ear ache, sore throat, coughing, sinus pain Cardiovascular: see HPI  Respiratory: see HPI  Gastrointestinal: Denied nausea, vomiting, abdominal pain, diarrhea Genital/Urinary: Denied dysuria, hematuria, urinary frequency/urgency Musculoskeletal: Denied muscle ache, joint pain, weakness Skin: Denied rash, wound Neuro: Denied headache, dizziness, syncope Psych: Denied history of depression/anxiety  Endocrine: Denied history of diabetes   Physical Exam/Data:   Vitals:   06/01/22 1030 06/01/22 1312 06/01/22 1315 06/01/22 1415  BP: 138/69  129/65 139/66  Pulse: 66  (!) 57 (!) 58  Resp: '14  17 14  '$ Temp:  (!) 97.5 F (36.4 C)    TempSrc:  Oral    SpO2: 100%  98% 100%  Weight:      Height:       No intake or output data in the 24 hours ending 06/01/22 1611    06/01/2022    9:16 AM 05/18/2022   11:13 AM 04/29/2022   10:11 AM  Last 3 Weights  Weight (lbs) 164 lb 167 lb 0.6 oz 171 lb  Weight (kg) 74.39 kg 75.769 kg 77.565 kg  Body mass index is 32.03 kg/m.   Vitals:  Vitals:   06/01/22 1315 06/01/22 1415  BP: 129/65 139/66  Pulse: (!) 57 (!) 58  Resp: 17 14  Temp:    SpO2: 98% 100%   General Appearance: In no apparent distress,  laying in bed HEENT: Normocephalic, atraumatic. Neck: Supple, trachea midline, JVD up to upper neck with HOB at 30 degree  Cardiovascular: Regular rate and rhythm, normal [id number],  systolic murmur grade III at RUSB Respiratory: Resting breathing unlabored, lungs sounds clear to auscultation bilaterally, no use of accessory muscles. On room air.  No wheezes, rales or rhonchi.   Gastrointestinal: Bowel sounds positive, abdomen soft, non-tender Extremities: Able to move all extremities in bed without difficulty, no edema of BLE, compression stocking in place  Musculoskeletal: Normal muscle bulk and tone Skin: Intact, warm, dry. No rashes or petechiae noted in exposed areas.  Neurologic: Alert, oriented to person, place and time. Fluent speech, no cognitive deficit, no gross focal neuro deficit Psychiatric: Normal affect. Mood is appropriate.    EKG:  The EKG was personally reviewed and demonstrates:    EKG showed sinus rhythm 62 bpm, lateral TWI unchanged from EKG on 04/20/22.   Telemetry:  Telemetry was personally reviewed and demonstrates:    Sinus rhythm 60-70s, artifacts   Relevant CV Studies:   Echo from 12/15/21:  1. Left ventricular ejection fraction, by estimation, is 60 to 65%. The  left ventricle has normal function. The left ventricle has no regional  wall motion abnormalities. Left ventricular diastolic parameters are  consistent with Grade I diastolic  dysfunction (impaired relaxation).   2. Right ventricular systolic function is normal. The right ventricular  size is normal. There is normal pulmonary artery systolic pressure.   3. Left atrial size was moderately dilated.   4. The mitral valve is grossly normal. No evidence of mitral valve  regurgitation.   5. The aortic valve is calcified. There is moderate calcification of the  aortic valve. Aortic valve regurgitation is mild. Moderate to severe  aortic valve stenosis. Aortic regurgitation PHT measures 479 msec. Aortic   valve area, by VTI measures 1.17 cm.   Aortic valve mean gradient measures 39.0 mmHg. Aortic valve Vmax measures  4.09 m/s. Peak gradient 67 mmHg. DI is 0.37.   6. The inferior vena cava is normal in size with greater than 50%  respiratory variability, suggesting right atrial pressure of 3 mmHg.   7. Evidence of atrial level shunting detected by color flow Doppler.  There is a small patent foramen ovale with predominantly left to right  shunting across the atrial septum.   Comparison(s): No prior Echocardiogram.    Laboratory Data:  High Sensitivity Troponin:   Recent Labs  Lab 06/01/22 0923 06/01/22 1219  TROPONINIHS 8 6     Chemistry Recent Labs  Lab 06/01/22 0923  NA 135  K 4.1  CL 102  CO2 25  GLUCOSE 98  BUN 17  CREATININE 0.90  CALCIUM 9.2  GFRNONAA >60  ANIONGAP 8    Recent Labs  Lab 06/01/22 0923  PROT 6.3*  ALBUMIN 3.7  AST 29  ALT 26  ALKPHOS 70  BILITOT 0.7   Lipids No results for input(s): "CHOL", "TRIG", "HDL", "LABVLDL", "LDLCALC", "CHOLHDL" in the last 168 hours.  Hematology Recent Labs  Lab 06/01/22 0923  WBC 9.0  RBC 5.05  HGB 15.1*  HCT 45.2  MCV 89.5  MCH 29.9  MCHC 33.4  RDW 14.0  PLT 180  Thyroid  Recent Labs  Lab 06/01/22 0923  TSH 8.745*    BNPNo results for input(s): "BNP", "PROBNP" in the last 168 hours.  DDimer No results for input(s): "DDIMER" in the last 168 hours.   Radiology/Studies:  DG Chest Port 1 View  Result Date: 06/01/2022 CLINICAL DATA:  Chest pain and near-syncope EXAM: PORTABLE CHEST 1 VIEW COMPARISON:  Chest radiograph dated 06/23/2021, CT chest dated 01/05/2018 FINDINGS: Normal lung volumes. Bibasilar patchy opacities. Left lateral lung scarring. Unchanged irregular radiodensity in the lateral upper left lung in keeping with known calcified pleural plaque. No pleural effusion or pneumothorax. The heart size and mediastinal contours are within normal limits. Postsurgical changes of the right  humerus. IMPRESSION: Bibasilar patchy opacities, likely atelectasis. Aspiration or pneumonia can be considered in the appropriate clinical setting. Electronically Signed   By: Darrin Nipper M.D.   On: 06/01/2022 09:44   DG Foot Complete Right  Result Date: 05/30/2022 CLINICAL DATA:  Pain EXAM: RIGHT FOOT COMPLETE - 3 VIEW COMPARISON:  09/23/2021 FINDINGS: There is no evidence of fracture or dislocation. There is no evidence of arthropathy or other focal bone abnormality. Soft tissues are unremarkable. Small posterior and plantar calcaneal spurs. Postop change with the ligament screw overlying the calcaneus. IMPRESSION: Calcaneal spurs.  No acute osseous abnormalities. Electronically Signed   By: Sammie Bench M.D.   On: 05/30/2022 21:08     Assessment and Plan:   Severe aortic stenosis - Last Echo 12/2021 showed AS with VTI measures 1.17 cm. mean gradient measures 39.0 mmHg. Peak gradient 67 mmHg. DI is 0.37.  - Presented with new onset of intermittent progressive chest pain, pre-syncope for 1 week - CXR with Bibasilar patchy opacities - clinically without overt signs of volume overload   - Will repeat Echo now  - will check BNP  - BP WNL, may challenge a dose IV Lasix if chest pain re-curs, avoid nitro, avoid tachycardia and hypotension  - discussed LHC , patient agreeable, will consult structural heart team once LHC complete   Hypothyroidism - TSH elevated 8.7, was WNL 05/18/22, will add FT4 - resume PTA levothyroxine   COPD - no acute exacerbation, resume home meds Breo  HLD - resume PTA lipitor '10mg'$  daily  Anxiety - resume PTA QHS clonazepam   Tobacco abuse - discussed smoking cessation   RLS - resume PTA baclofen '10mg'$  TID    Shared Decision Making/Informed Consent  The risks [stroke (1 in 1000), death (1 in 1000), kidney failure [usually temporary] (1 in 500), bleeding (1 in 200), allergic reaction [possibly serious] (1 in 200)], benefits (diagnostic support and management  of coronary artery disease) and alternatives of a cardiac catheterization were discussed in detail with Ms. Goslin and she is willing to proceed.     Risk Assessment/Risk Scores:    New York Heart Association (NYHA) Functional Class NYHA Class II     For questions or updates, please contact [place] Please consult [email or web address] for contact info under    Signed, Margie Billet, NP  06/01/2022 4:11 PM

## 2022-06-01 NOTE — ED Triage Notes (Signed)
Pt BIB [facility] from home, complaining of centralized chest pain that has been ongoing for weeks. But this morning, patient was very weak and had a near syncopal episode. Patient was given 324 of ASA, chest pain has now resolved. Diagnosed with aortic stenosis within the last week.  EMS Vitals 145/64 68 HR 96% RA

## 2022-06-01 NOTE — ED Notes (Signed)
Ambulating to bathroom, gait steady

## 2022-06-01 NOTE — ED Notes (Signed)
Nitropaste removed per order

## 2022-06-01 NOTE — ED Notes (Signed)
ED TO INPATIENT HANDOFF REPORT  ED Nurse Name and Phone #: [phone]  S Name/Age/Gender Carolyn [name] 71 y.o. female Room/Bed: 043C/043C  Code Status   Code Status: Full Code  Home/SNF/Other Home Patient oriented to: self, place, time, and situation Is this baseline? Yes   Triage Complete: Triage complete  Chief Complaint Severe aortic stenosis [I35.0]  Triage Note Pt BIB [facility] from home, complaining of centralized chest pain that has been ongoing for weeks. But this morning, patient was very weak and had a near syncopal episode. Patient was given 324 of ASA, chest pain has now resolved. Diagnosed with aortic stenosis within the last week.  EMS Vitals 145/64 68 HR 96% RA    Allergies Allergies  Allergen Reactions   Trazodone And Nefazodone Shortness Of Breath   Azithromycin Rash    Level of Care/Admitting Diagnosis ED Disposition     ED Disposition  Admit   Condition  --   Comment  Hospital Area: [facility] [[id number]]  Level of Care: Telemetry Cardiac [[id number]]  May admit patient to [facility] or [facility] if equivalent level of care is available:: No  Covid Evaluation: Asymptomatic - no recent exposure (last 10 days) testing not required  Diagnosis: Severe aortic stenosis [id number]  Admitting Physician: Skeet Latch [id number]  Attending Physician: Skeet Latch [id number]  Certification:: I certify this patient will need inpatient services for at least 2 midnights  Estimated Length of Stay: 2          B Medical/Surgery History Past Medical History:  Diagnosis Date   BCC (basal cell carcinoma) 08/05/2015   left neck   BCC (basal cell carcinoma) infilt 02/20/2016   right ala nasal   BCC (basal cell carcinoma) sup&nod 06/03/2016   left lower sternum   BCC (basal cell carcinoma) ulcerated 03/19/2016   mid chest between breasts   Cervical cancer ([place])    Cervical cancer ([place])    COPD (chronic obstructive pulmonary  disease) ([place])    Depression    Hyperlipidemia    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[place]) 07/05/2014   right shoulder blade   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   left nare   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   left upper shin   SCC (squamous cell carcinoma) in situ 03/19/2016   right pinky   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   right thigh, left upper shin   SCC (squamous cell carcinoma) well diff 12/13/2008   left hand   SCC (squamous cell carcinoma) well diff x2 12/25/2015   left and right forearm   Thyroid disease    Past Surgical History:  Procedure Laterality Date   CERVICAL CONIZATION W/BX     EYE SURGERY Bilateral    Cataracts   MELANOMA EXCISION  08/2014   back   SHOULDER SURGERY Right      A IV Location/Drains/Wounds Patient Lines/Drains/Airways Status     Active Line/Drains/Airways     Name Placement date Placement time Site Days   Peripheral IV 06/01/22 20 G Left Antecubital 06/01/22  0922  Antecubital  less than 1            Intake/Output Last 24 hours No intake or output data in the 24 hours ending 06/01/22 1930  Labs/Imaging Results for orders placed or performed during the hospital encounter of 06/01/22 (from the past 48 hour(s))  CBC     Status: Abnormal   Collection Time: 06/01/22  9:23 AM  Result Value Ref Range   WBC 9.0 4.0 - 10.5 K/uL   RBC 5.05 3.87 - 5.11 MIL/uL   Hemoglobin 15.1 (H) 12.0 - 15.0 g/dL   HCT 45.2 36.0 - 46.0 %   MCV 89.5 80.0 - 100.0 fL   MCH 29.9 26.0 - 34.0 pg   MCHC 33.4 30.0 - 36.0 g/dL   RDW 14.0 11.5 - 15.5 %   Platelets 180 150 - 400 K/uL   nRBC 0.0 0.0 - 0.2 %    Comment: Performed at [facility], [place] [address], [place], [place] [address]  Troponin I (High Sensitivity)     Status: None   Collection Time: 06/01/22  9:23 AM  Result Value Ref Range   Troponin I (High Sensitivity) 8 <18 ng/L    Comment:  (NOTE) Elevated high sensitivity troponin I (hsTnI) values and significant  changes across serial measurements may suggest ACS but many other  chronic and acute conditions are known to elevate hsTnI results.  Refer to the "Links" section for chest pain algorithms and additional  guidance. Performed at [facility], [place] [address] [place], [place] [address]   Comprehensive metabolic panel     Status: Abnormal   Collection Time: 06/01/22  9:23 AM  Result Value Ref Range   Sodium 135 135 - 145 mmol/L   Potassium 4.1 3.5 - 5.1 mmol/L   Chloride 102 98 - 111 mmol/L   CO2 25 22 - 32 mmol/L   Glucose, Bld 98 70 - 99 mg/dL    Comment: Glucose reference range applies only to samples taken after fasting for at least 8 hours.   BUN 17 8 - 23 mg/dL   Creatinine, Ser 0.90 0.44 - 1.00 mg/dL   Calcium 9.2 8.9 - 10.3 mg/dL   Total Protein 6.3 (L) 6.5 - 8.1 g/dL   Albumin 3.7 3.5 - 5.0 g/dL   AST 29 15 - 41 U/L   ALT 26 0 - 44 U/L   Alkaline Phosphatase 70 38 - 126 U/L   Total Bilirubin 0.7 0.3 - 1.2 mg/dL   GFR, Estimated >60 >60 mL/min    Comment: (NOTE) Calculated using the CKD-EPI Creatinine Equation (2021)    Anion gap 8 5 - 15    Comment: Performed at [facility] [address], [place], [place] [address]  TSH     Status: Abnormal   Collection Time: 06/01/22  9:23 AM  Result Value Ref Range   TSH 8.745 (H) 0.350 - 4.500 uIU/mL    Comment: Performed by a 3rd Generation assay with a functional sensitivity of <=0.01 uIU/mL. Performed at [facility], [place] [address], [place], [place] [address]   Troponin I (High Sensitivity)     Status: None   Collection Time: 06/01/22 12:19 PM  Result Value Ref Range   Troponin I (High Sensitivity) 6 <18 ng/L    Comment: (NOTE) Elevated high sensitivity troponin I (hsTnI) values and significant  changes across serial measurements may suggest ACS but many other  chronic and acute conditions are known to elevate hsTnI results.   Refer to the "Links" section for chest pain algorithms and additional  guidance. Performed at [facility], [place] [address] [place], [place] [address]   Brain natriuretic peptide     Status: Abnormal   Collection Time: 06/01/22  4:28 PM  Result Value  Ref Range   B Natriuretic Peptide 212.5 (H) 0.0 - 100.0 pg/mL    Comment: Performed at [facility] [address], [place], [place] [address]  T4, free     Status: Abnormal   Collection Time: 06/01/22  4:28 PM  Result Value Ref Range   Free T4 1.29 (H) 0.61 - 1.12 ng/dL    Comment: (NOTE) Biotin ingestion may interfere with free T4 tests. If the results are inconsistent with the TSH level, previous test results, or the clinical presentation, then consider biotin interference. If needed, order repeat testing after stopping biotin. Performed at [facility], [place] [address] [place], [place] [address]   HIV Antibody (routine testing w rflx)     Status: None   Collection Time: 06/01/22  4:28 PM  Result Value Ref Range   HIV Screen 4th Generation wRfx Non Reactive Non Reactive    Comment: Performed at [facility], [place] [address], [place] [address]  CBC     Status: None   Collection Time: 06/01/22  4:28 PM  Result Value Ref Range   WBC 7.8 4.0 - 10.5 K/uL   RBC 4.78 3.87 - 5.11 MIL/uL   Hemoglobin 14.3 12.0 - 15.0 g/dL   HCT 42.2 36.0 - 46.0 %   MCV 88.3 80.0 - 100.0 fL   MCH 29.9 26.0 - 34.0 pg   MCHC 33.9 30.0 - 36.0 g/dL   RDW 13.9 11.5 - 15.5 %   Platelets 173 150 - 400 K/uL   nRBC 0.0 0.0 - 0.2 %    Comment: Performed at [facility], [place] [address], [place], [place] [address]  Creatinine, serum     Status: None   Collection Time: 06/01/22  4:28 PM  Result Value Ref Range   Creatinine, Ser 0.81 0.44 - 1.00 mg/dL   GFR, Estimated >60 >60 mL/min    Comment: (NOTE) Calculated using the CKD-EPI Creatinine Equation (2021) Performed at [facility] [address],  [place],  [address]    DG Chest Port 1 View  Result Date: 06/01/2022 CLINICAL DATA:  Chest pain and near-syncope EXAM: PORTABLE CHEST 1 VIEW COMPARISON:  Chest radiograph dated 06/23/2021, CT chest dated 01/05/2018 FINDINGS: Normal lung volumes. Bibasilar patchy opacities. Left lateral lung scarring. Unchanged irregular radiodensity in the lateral upper left lung in keeping with known calcified pleural plaque. No pleural effusion or pneumothorax. The heart size and mediastinal contours are within normal limits. Postsurgical changes of the right humerus. IMPRESSION: Bibasilar patchy opacities, likely atelectasis. Aspiration or pneumonia can be considered in the appropriate clinical setting. Electronically Signed   By: Darrin Nipper M.D.   On: 06/01/2022 09:44    Pending Labs Unresulted Labs (From admission, onward)     Start     Ordered   06/02/22 0500  Lipoprotein A (LPA)  Tomorrow morning,   R        06/01/22 1611   06/02/22 [id number]  Basic metabolic panel  Tomorrow morning,   R        06/01/22 1611   06/02/22 0500  Lipid panel  Tomorrow morning,   R        06/01/22 1611   06/02/22 0500  CBC  Tomorrow morning,   R        06/01/22 1611   06/02/22 0500  Protime-INR  Tomorrow morning,   R        06/01/22 1611   06/02/22 0500  TSH  Tomorrow morning,   R        06/01/22 1657   06/02/22 0500  T3  Tomorrow morning,   R        06/01/22 1657   06/02/22 0500  T4, free  Tomorrow morning,   R        06/01/22 1657            Vitals/Pain Today's Vitals   06/01/22 1730 06/01/22 1800 06/01/22 1807 06/01/22 1815  BP:  (!) 125/106 131/67   Pulse:  74    Resp:  15    Temp:      TempSrc:      SpO2:  100%    Weight:      Height:      PainSc: 0-No pain   0-No pain    Isolation Precautions No active isolations  Medications Medications  fentaNYL (SUBLIMAZE) injection 25 mcg (has no administration in time range)  acetaminophen (TYLENOL) tablet 650 mg (has no administration in time range)   ondansetron (ZOFRAN) injection 4 mg (has no administration in time range)  heparin injection 5,000 Units (5,000 Units Subcutaneous Given 06/01/22 1629)  atorvastatin (LIPITOR) tablet 10 mg (10 mg Oral Not Given 06/01/22 1721)  levothyroxine (SYNTHROID) tablet 100 mcg (has no administration in time range)  baclofen (LIORESAL) tablet 10 mg (10 mg Oral Given 06/01/22 1722)  fluticasone furoate-vilanterol (BREO ELLIPTA) 100-25 MCG/ACT 1 puff (1 puff Inhalation Not Given 06/01/22 1805)  clonazePAM (KLONOPIN) tablet 0.5 mg (has no administration in time range)  nitroGLYCERIN (NITROGLYN) 2 % ointment 0.5 inch (0.5 inches Topical Given 06/01/22 0934)  furosemide (LASIX) injection 20 mg (20 mg Intravenous Given 06/01/22 1722)    Mobility walks with person assist     Focused Assessments Cardiac Assessment Handoff:  Cardiac Rhythm: Normal sinus rhythm No results found for: "CKTOTAL", "CKMB", "CKMBINDEX", "TROPONINI" No results found for: "DDIMER" Does the Patient currently have chest pain? No    R Recommendations: See Admitting Provider Note  Report given to:   Additional Notes:

## 2022-06-01 NOTE — Consult Note (Incomplete)
[place] VALVE TEAM  Cardiology Consultation:   Patient ID: Carolyn [name] MRN: :[id number]; DOB: [birth year]  Admit date: 06/01/2022 Date of Consult: 06/03/2022  Primary Care Provider: [name], DO [facility] Cardiologist: Dr. Stanford Breed, MD   Patient Profile:   Carolyn [name] is a 72 y.o. female with a hx of  COPD with ongoing tobacco use, hypothyroidism, anxiety, HLD, RLS and previously dx mod/severe AS who is being seen for the evaluation of possible TAVR at the request of Dr. Oval Linsey.    History of Present Illness:   Carolyn [name] is a 72yo F who lives in [place] alone. She has a son who lives close by and a sister who also lives near. She continues to work at [employer] in [employer] section part time. She states that for about a year she has had progressive fatigue and mild SOB however felt this was related to her age and smoking history. She reports that she would on occasion have fleeting chest twinges, but no overt chest pain. Last week she states that she got out of bed and felt as if she was going to pass out with symptoms of dizziness and chest pressure. Her symptoms passed and she went to work. Unfortunately her symptoms recurred again last Sunday, prompting her visit to the ED.   In the ED CMET and CBC were unremarkable. Hst negative at 8>6. CXR with bibasilar patchy opacities, likely atelectasis. EKG showed sinus rhythm 62bpm, lateral TWI seems unchanged from 04/20/22 EKG. She was given nitro 0.5 inch at ED for chest pain. Cardiology is consulted with plans for repeat echocardiogram. R/LHC performed 06/02/22 showed widely patent coronary arteries with mild nonobstructive plaquing in the mid LAD and otherwise normal coronary arteries, normal right heart pressures with confirmed severe AS. Echo showed normal LVEF at 65-70% with trivial MR, mild to moderate AR and moderate to severe aortic stenosis with a mean gradient at  33.43mHg, peak gradient at 59.941mg, DVI at 0.34, and AVA by VTI at 0.45cm2.   Carolyn [name] recently seen by Dr. CrStanford Breed/15/24 after referral from her PCP for further evaluation of aortic stenosis. Echocardiogram from 12/2021 showed LVEF 60-65%, no RWMA, grade I DD, normal RV, normal PASP, mod LAE, mild AI, moderate to severe AS with VTI measures 1.17 cm, mean gradient 39.0 mmHg. Aortic valve Vmax measures 4.09 m/s. Peak gradient 67 mmHg. DI is 0.37. Small PFO. She was able to function and remained very active without significant dyspnea, chest pain, or syncope therefore she was advised to monitor symptoms and repeat echo in 6 months.   On my assessment, she is resting in bed with no chest pain, palpitations, LE edema, orthopnea, SOB, recurrent dizziness, or chest pain. No bleeding in stool or urine. She is edentulous with no active oral issues.   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   left nare   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   left upper shin   SCC (squamous cell carcinoma) in situ 03/19/2016   right pinky   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   right thigh, left upper shin   SCC (squamous cell carcinoma) well diff 12/13/2008   left hand   SCC (squamous cell carcinoma) well diff x2 12/25/2015   left and right forearm   Thyroid disease     Past Surgical History:   Procedure Laterality Date   CERVICAL CONIZATION W/BX     EYE SURGERY Bilateral    Cataracts   MELANOMA EXCISION  08/2014   back   RIGHT HEART CATH AND CORONARY ANGIOGRAPHY N/A 06/02/2022   Procedure: RIGHT HEART CATH AND CORONARY ANGIOGRAPHY;  Surgeon: Sherren Mocha, MD;  Location: [facility];  Service: Cardiovascular;  Laterality: N/A;   SHOULDER SURGERY Right      Home Medications:  Prior to Admission medications   Medication Sig Start Date End Date Taking? Authorizing Provider  aspirin 81 MG EC tablet TAKE 1 TABLET BY MOUTH EVERY DAY Patient taking differently: Take 81 mg by mouth daily. 11/11/18  Yes Terald Sleeper, PA-C  atorvastatin (LIPITOR) 10 MG tablet TAKE 1 TABLET BY MOUTH EVERY DAY 02/24/22  Yes [name], DO  baclofen (LIORESAL) 10 MG tablet TAKE 1 TABLET BY MOUTH THREE TIMES A DAY 02/24/22  Yes Matthews, Cody, DO  BREO ELLIPTA 100-25 MCG/ACT AEPB TAKE 1 PUFF BY MOUTH EVERY DAY Patient taking differently: Inhale 1 puff into the lungs daily. 02/24/22  Yes [name], DO  Calcium Carbonate (CALCIUM 600 PO) Take 600 mg by mouth at bedtime.   Yes [provider]  clonazePAM (KLONOPIN) 0.5 MG tablet TAKE 1 TABLET BY MOUTH EVERYDAY AT BEDTIME Patient taking differently: Take 0.5 mg by mouth at bedtime. 05/25/22  Yes [name], DO  ferrous sulfate 325 (65 FE) MG EC tablet Take 1 tablet (325 mg total) by mouth daily with breakfast. 03/26/22  Yes Chima, Anderson Malta, MD  fluticasone (FLONASE) 50 MCG/ACT nasal spray INSTILL 1 SPRAY INTO BOTH NOSTRILS DAILY Patient taking differently: Place 1 spray into both nostrils daily. 04/10/22  Yes [name], DO  Garlic 123XX123 MG CAPS Take 1,000 mg by mouth daily.   Yes [provider]  levothyroxine (SYNTHROID) 100 MCG tablet TAKE 1 TABLET BY MOUTH DAILY BEFORE BREAKFAST. 10/27/21  Yes [name], DO  meloxicam (MOBIC) 15 MG tablet Take 1 tablet (15 mg total) by mouth daily. 05/29/22  Yes Lorenda Peck, DPM   Multiple Vitamins-Minerals (CENTRUM SILVER ADULT 50+ PO) Take 1 tablet by mouth daily.   Yes [provider]  Omega-3 Fatty Acids (FISH OIL) 1000 MG CAPS Take 1,000 mg by mouth daily.   Yes [provider]  pregabalin (LYRICA) 75 MG capsule Take 1 capsule (75 mg total) by mouth at bedtime as needed. Patient taking differently: Take 75 mg by mouth at bedtime. 03/25/22  Yes Genia Harold, MD  rOPINIRole (REQUIP) 3 MG tablet TAKE 1 TABLET BY MOUTH AT BEDTIME. Patient taking differently: Take 3 mg by mouth at bedtime. 04/10/22  Yes [name], DO    Inpatient Medications: Scheduled Meds:  atorvastatin  10 mg Oral Daily   baclofen  10 mg Oral TID   fluticasone furoate-vilanterol  1 puff Inhalation Daily   heparin  5,000 Units Subcutaneous Q8H   levothyroxine  100  mcg Oral QAC breakfast   pregabalin  75 mg Oral Daily   rOPINIRole  3 mg Oral QHS   Continuous Infusions:  PRN Meds: acetaminophen, clonazePAM, fentaNYL (SUBLIMAZE) injection, ondansetron (ZOFRAN) IV  Allergies:    Allergies  Allergen Reactions   Trazodone And Nefazodone Shortness Of Breath   Azithromycin Rash    Social History:   Social History   Socioeconomic History   Marital status: Divorced    Spouse name: Not on file   Number of children: 2   Years of education: Not on file   Highest education level: GED or equivalent  Occupational History   Occupation: disabled  Tobacco Use   Smoking status: Every Day    Packs/day: 0.50    Types: Cigarettes    Start date: 04/06/1968    Passive exposure: Never   Smokeless tobacco: Never  Vaping Use   Vaping Use: Never used  Substance and Sexual Activity   Alcohol use: Never   Drug use: No   Sexual activity: Not on file  Other Topics Concern   Not on file  Social History Narrative   Lives alone   R handed   Caffeine: half a litter of soda in a day.    Social Determinants of Health   Financial Resource Strain: Low Risk  (05/12/2018)   Overall  Financial Resource Strain (CARDIA)    Difficulty of Paying Living Expenses: Not hard at all  Food Insecurity: No Food Insecurity (06/01/2022)   Hunger Vital Sign    Worried About Running Out of Food in the Last Year: Never true    Ran Out of Food in the Last Year: Never true  Transportation Needs: No Transportation Needs (06/01/2022)   PRAPARE - Hydrologist (Medical): No    Lack of Transportation (Non-Medical): No  Physical Activity: Inactive (05/12/2018)   Exercise Vital Sign    Days of Exercise per Week: 0 days    Minutes of Exercise per Session: 0 min  Stress: No Stress Concern Present (05/12/2018)   [place]    Feeling of Stress : Not at all  Social Connections: Somewhat Isolated (05/12/2018)   Social Connection and Isolation Panel [NHANES]    Frequency of Communication with Friends and Family: More than three times a week    Frequency of Social Gatherings with Friends and Family: More than three times a week    Attends Religious Services: More than 4 times per year    Active Member of [employer] or Organizations: No    Attends Archivist Meetings: Never    Marital Status: Divorced  Human resources officer Violence: Not At Risk (06/01/2022)   Humiliation, Afraid, Rape, and Kick questionnaire    Fear of Current or Ex-Partner: No    Emotionally Abused: No    Physically Abused: No    Sexually Abused: No    Family History:    Family History  Problem Relation Age of Onset   Heart disease Mother    Aneurysm Mother    Heart disease Father    Kidney failure Father    Cancer Sister    Cancer Brother    Cirrhosis Daughter    Cancer Sister        Bronchial   ROS:  Please see the history of present illness.   All other ROS reviewed and negative.     Physical Exam/Data:   Vitals:   06/02/22 1916 06/02/22 2205 06/03/22  0423 06/03/22 0717  BP: (!) 154/67  123/61   Pulse: (!) 59 65 64    Resp: 14  14   Temp: 98.8 F (37.1 C)  97.7 F (36.5 C)   TempSrc: Oral  Oral   SpO2: 97% 94% 95% 96%  Weight:      Height:        Intake/Output Summary (Last 24 hours) at 06/03/2022 1045 Last data filed at 06/02/2022 2020 Gross per 24 hour  Intake 31.45 ml  Output 300 ml  Net -268.55 ml      06/02/2022    3:26 AM 06/02/2022   12:42 AM 06/01/2022    9:16 AM  Last 3 Weights  Weight (lbs) 169 lb 12.8 oz 169 lb 12.8 oz 164 lb  Weight (kg) 77.021 kg 77.021 kg 74.39 kg     Body mass index is 33.16 kg/m.   General: Well developed, well nourished, NAD Lungs:Clear to ausculation bilaterally. No wheezes, rales, or rhonchi. Breathing is unlabored. Cardiovascular: RRR with S1 S2. + systolic murmur Abdomen: Soft, non-tender, non-distended. No obvious abdominal masses. Extremities: No edema.  Neuro: Alert and oriented. No focal deficits. No facial asymmetry. MAE spontaneously. Psych: Responds to questions appropriately with normal affect.    EKG:  The EKG was personally reviewed and demonstrates:  06/01/22 NSR, HR 62bpm.  Telemetry:  Telemetry was personally reviewed and demonstrates: SR 60s   Relevant CV Studies:  Echocardiogram 06/03/22:   1. Left ventricular ejection fraction, by estimation, is 65 to 70%. The  left ventricle has normal function. The left ventricle has no regional  wall motion abnormalities. There is mild left ventricular hypertrophy.  Left ventricular diastolic parameters  are consistent with Grade I diastolic dysfunction (impaired relaxation).   2. Right ventricular systolic function is normal. The right ventricular  size is normal. Tricuspid regurgitation signal is inadequate for assessing  PA pressure.   3. Left atrial size was upper normal.   4. The mitral valve is grossly normal, mildly calcified. Trivial mitral  valve regurgitation.   5. 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 There is moderate  calcification of the aortic valve. Aortic valve  regurgitation is mild to  moderate. Moderate to severe aortic valve stenosis, upper end of scale.  Aortic regurgitation PHT measures 748 msec.  Aortic valve mean gradient measures 33.5 mmHg. Dimentionless index 0.34.   6. The inferior vena cava is normal in size with <50% respiratory  variability, suggesting right atrial pressure of 8 mmHg.   Comparison(s): Prior images reviewed side by side. Aortic valve is  functionally bicuspid with evidence of moderate to severe stenosis, mean  AV gradient up to 34 mmHg and dimentionless index 0.34.   R/LHC 06/02/22:  1.  Widely patent coronary arteries with mild nonobstructive plaquing in the mid LAD and otherwise normal coronary arteries 2.  Normal right heart hemodynamics with RA pressure mean of 2 mmHg, PA pressure 25/7 with a mean of 17 mmHg, and wedge pressure of 7 mmHg.  Preserved cardiac output at 4.8 L/min and cardiac index of 2.8 L/min/m. 3.  Severe aortic stenosis by echo assessment   Recommendations: The patient appears to be very well compensated.  Continue evaluation for treatment of symptomatic aortic stenosis.  Echo from 12/15/21:   1. Left ventricular ejection fraction, by estimation, is 60 to 65%. The  left ventricle has normal function. The left ventricle has no regional  wall motion abnormalities. Left ventricular diastolic parameters are  consistent with Grade I diastolic  dysfunction (impaired relaxation).   2. Right ventricular systolic function is normal. The right ventricular  size is normal. There is normal pulmonary artery systolic pressure.   3. Left atrial size was moderately dilated.   4. The mitral valve is grossly normal. No evidence of mitral valve  regurgitation.   5. The aortic valve is calcified. There is moderate calcification of the  aortic valve. Aortic valve regurgitation is mild. Moderate to severe  aortic valve stenosis. Aortic regurgitation PHT measures 479 msec. Aortic  valve area, by VTI measures 1.17  cm.   Aortic valve mean gradient measures 39.0 mmHg. Aortic valve Vmax measures  4.09 m/s. Peak gradient 67 mmHg. DI is 0.37.   6. The inferior vena cava is normal in size with greater than 50%  respiratory variability, suggesting right atrial pressure of 3 mmHg.   7. Evidence of atrial level shunting detected by color flow Doppler.  There is a small patent foramen ovale with predominantly left to right  shunting across the atrial septum.   Comparison(s): No prior Echocardiogram.   Laboratory Data:  High Sensitivity Troponin:   Recent Labs  Lab 06/01/22 0923 06/01/22 1219  TROPONINIHS 8 6     Chemistry Recent Labs  Lab 06/01/22 0923 06/01/22 1628 06/02/22 0208 06/02/22 1548 06/02/22 1555  NA 135  --  132* 144  141 139  K 4.1  --  3.7 3.1*  3.5 3.7  CL 102  --  98  --   --   CO2 25  --  23  --   --   GLUCOSE 98  --  101*  --   --   BUN 17  --  19  --   --   CREATININE 0.90 0.81 0.89  --   --   CALCIUM 9.2  --  8.3*  --   --   GFRNONAA >60 >60 >60  --   --   ANIONGAP 8  --  11  --   --     Recent Labs  Lab 06/01/22 0923  PROT 6.3*  ALBUMIN 3.7  AST 29  ALT 26  ALKPHOS 70  BILITOT 0.7   Hematology Recent Labs  Lab 06/01/22 0923 06/01/22 1628 06/02/22 0208 06/02/22 1548 06/02/22 1555  WBC 9.0 7.8 6.8  --   --   RBC 5.05 4.78 4.54  --   --   HGB 15.1* 14.3 13.6 12.6  13.6 13.6  HCT 45.2 42.2 39.4 37.0  40.0 40.0  MCV 89.5 88.3 86.8  --   --   MCH 29.9 29.9 30.0  --   --   MCHC 33.4 33.9 34.5  --   --   RDW 14.0 13.9 13.7  --   --   PLT 180 173 173  --   --    BNP Recent Labs  Lab 06/01/22 1628  BNP 212.5*    DDimer No results for input(s): "DDIMER" in the last 168 hours.   Radiology/Studies:  ECHOCARDIOGRAM COMPLETE  Result Date: 06/02/2022    ECHOCARDIOGRAM REPORT   Patient Name:   Carolyn [name] Date of Exam: 06/02/2022 Medical Rec #:  [id number]        Height:       60.0 in Accession #:    [id number]       Weight:       169.8 lb Date  of Birth:  1950-07-14       BSA:  1.741 m Patient Age:    35 years         BP:           117/63 mmHg Patient Gender: F                HR:           59 bpm. Exam Location:  Inpatient Procedure: 2D Echo, Cardiac Doppler and Color Doppler Indications:    I35.0 Nonrheumatic aortic (valve) stenosis  History:        Patient has prior history of Echocardiogram examinations, most                 recent 12/15/2021. COPD, Aortic Valve Disease,                 Signs/Symptoms:Murmur; Risk Factors:Current Smoker.  Sonographer:    Wilkie Aye RVT RCS Referring Phys: [id number] [facility]  Sonographer Comments: Image acquisition challenging due to respiratory motion. IMPRESSIONS  1. Left ventricular ejection fraction, by estimation, is 65 to 70%. The left ventricle has normal function. The left ventricle has no regional wall motion abnormalities. There is mild left ventricular hypertrophy. Left ventricular diastolic parameters are consistent with Grade I diastolic dysfunction (impaired relaxation).  2. Right ventricular systolic function is normal. The right ventricular size is normal. Tricuspid regurgitation signal is inadequate for assessing PA pressure.  3. Left atrial size was upper normal.  4. The mitral valve is grossly normal, mildly calcified. Trivial mitral valve regurgitation.  5. 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 There is moderate calcification of the aortic valve. Aortic valve regurgitation is mild to moderate. Moderate to severe aortic valve stenosis, upper end of scale. Aortic regurgitation PHT measures 748 msec. Aortic valve mean gradient measures 33.5 mmHg. Dimentionless index 0.34.  6. The inferior vena cava is normal in size with <50% respiratory variability, suggesting right atrial pressure of 8 mmHg. Comparison(s): Prior images reviewed side by side. Aortic valve is functionally bicuspid with evidence of moderate to severe stenosis, mean AV gradient up to 34 mmHg and dimentionless index 0.34.  FINDINGS  Left Ventricle: Left ventricular ejection fraction, by estimation, is 65 to 70%. The left ventricle has normal function. The left ventricle has no regional wall motion abnormalities. The left ventricular internal cavity size was normal in size. There is  mild left ventricular hypertrophy. Left ventricular diastolic parameters are consistent with Grade I diastolic dysfunction (impaired relaxation). Right Ventricle: The right ventricular size is normal. No increase in right ventricular wall thickness. Right ventricular systolic function is normal. Tricuspid regurgitation signal is inadequate for assessing PA pressure. Left Atrium: Left atrial size was upper normal. Right Atrium: Right atrial size was normal in size. Pericardium: There is no evidence of pericardial effusion. Mitral Valve: The mitral valve is grossly normal. There is mild calcification of the mitral valve leaflet(s). Trivial mitral valve regurgitation. Tricuspid Valve: The tricuspid valve is grossly normal. Tricuspid valve regurgitation is trivial. Aortic Valve: The aortic valve is bicuspid. There is moderate calcification of the aortic valve. There is mild to moderate aortic valve annular calcification. Aortic valve regurgitation is mild to moderate. Aortic regurgitation PHT measures 748 msec. Moderate to severe aortic stenosis is present. Aortic valve mean gradient measures 33.5 mmHg. Aortic valve peak gradient measures 59.9 mmHg. Aortic valve area, by VTI measures 0.45 cm. Pulmonic Valve: The pulmonic valve was grossly normal. Pulmonic valve regurgitation is trivial. Aorta: The aortic root is normal in size and structure. Venous: The inferior vena cava  is normal in size with less than 50% respiratory variability, suggesting right atrial pressure of 8 mmHg. IAS/Shunts: No atrial level shunt detected by color flow Doppler.  LEFT VENTRICLE PLAX 2D LVIDd:         4.10 cm   Diastology LVIDs:         2.70 cm   LV e' medial:    4.67 cm/s LV PW:          1.20 cm   LV E/e' medial:  13.9 LV IVS:        1.10 cm   LV e' lateral:   4.26 cm/s LVOT diam:     1.30 cm   LV E/e' lateral: 15.3 LV SV:         43 LV SV Index:   25 LVOT Area:     1.33 cm  RIGHT VENTRICLE            IVC RV Basal diam:  3.90 cm    IVC diam: 2.00 cm RV S prime:     5.86 cm/s TAPSE (M-mode): 2.1 cm LEFT ATRIUM             Index        RIGHT ATRIUM           Index LA diam:        3.00 cm 1.72 cm/m   RA Area:     12.70 cm LA Vol (A2C):   46.9 ml 26.94 ml/m  RA Volume:   29.20 ml  16.77 ml/m LA Vol (A4C):   52.6 ml 30.21 ml/m LA Biplane Vol: 51.6 ml 29.64 ml/m  AORTIC VALVE                     PULMONIC VALVE AV Area (Vmax):    0.43 cm      PV Vmax:       1.06 m/s AV Area (Vmean):   0.40 cm      PV Peak grad:  4.5 mmHg AV Area (VTI):     0.45 cm AV Vmax:           387.00 cm/s AV Vmean:          269.500 cm/s AV VTI:            0.966 m AV Peak Grad:      59.9 mmHg AV Mean Grad:      33.5 mmHg LVOT Vmax:         126.00 cm/s LVOT Vmean:        82.100 cm/s LVOT VTI:          0.327 m LVOT/AV VTI ratio: 0.34 AI PHT:            748 msec AR Vena Contracta: 0.60 cm  AORTA Ao Root diam: 2.40 cm Ao Arch diam: 2.4 cm MITRAL VALVE MV Area (PHT): 2.70 cm    SHUNTS MV Decel Time: 281 msec    Systemic VTI:  0.33 m MV E velocity: 65.10 cm/s  Systemic Diam: 1.30 cm MV A velocity: 87.40 cm/s MV E/A ratio:  0.74 Rozann Lesches MD Electronically signed by Rozann Lesches MD Signature Date/Time: 06/02/2022/7:25:55 PM    Final    CARDIAC CATHETERIZATION  Result Date: 06/02/2022 1.  Widely patent coronary arteries with mild nonobstructive plaquing in the mid LAD and otherwise normal coronary arteries 2.  Normal right heart hemodynamics with RA pressure mean of 2 mmHg, PA pressure 25/7 with a mean of 17 mmHg,  and wedge pressure of 7 mmHg.  Preserved cardiac output at 4.8 L/min and cardiac index of 2.8 L/min/m. 3.  Severe aortic stenosis by echo assessment Recommendations: The patient appears to be very well  compensated.  Continue evaluation for treatment of symptomatic aortic stenosis.   DG Chest Port 1 View  Result Date: 06/01/2022 CLINICAL DATA:  Chest pain and near-syncope EXAM: PORTABLE CHEST 1 VIEW COMPARISON:  Chest radiograph dated 06/23/2021, CT chest dated 01/05/2018 FINDINGS: Normal lung volumes. Bibasilar patchy opacities. Left lateral lung scarring. Unchanged irregular radiodensity in the lateral upper left lung in keeping with known calcified pleural plaque. No pleural effusion or pneumothorax. The heart size and mediastinal contours are within normal limits. Postsurgical changes of the right humerus. IMPRESSION: Bibasilar patchy opacities, likely atelectasis. Aspiration or pneumonia can be considered in the appropriate clinical setting. Electronically Signed   By: Darrin Nipper M.D.   On: 06/01/2022 09:44    STS Risk Calculator: Procedure Type: Isolated AVR PERIOPERATIVE OUTCOME ESTIMATE % Operative Mortality 2.09% Morbidity & Mortality 7.84% Stroke 1.19% Renal Failure 1.35% Reoperation 3.46% Prolonged Ventilation 4.25% Deep Sternal Wound Infection 0.069% [place] [facility] Stay (>14 days) 2.81% [place] [facility] Stay (<6 days)* 49.4%  [facility] Cardiomyopathy Questionnaire     06/03/2022   10:45 AM  KCCQ-12  1 a. Ability to shower/bathe Not at all limited  1 b. Ability to walk 1 block Not at all limited  1 c. Ability to hurry/jog Moderately limited  2. Edema feet/ankles/legs Less than once a week  3. Limited by fatigue 1-2 times a week  4. Limited by dyspnea 1-2 times a week  5. Sitting up / on 3+ pillows Never over the past 2 weeks  6. Limited enjoyment of life Slightly limited  7. Rest of life w/ symptoms Somewhat satisfied  8 a. Participation in hobbies Moderately limited  8 b. Participation in chores Moderately limited  8 c. Visiting family/friends Slightly limited    Assessment and Plan:   Carolyn [name] is a 72 y.o. female with symptoms of severe, stage D1 aortic  stenosis with NYHA Class III symptoms. I have reviewed the patient's recent echocardiogram which is notable for normal LV systolic function and severe aortic stenosis with peak gradient of 59.48mHg and mean transvalvular gradient of 33.524mg. The patient's dimensionless index is 0.34 and calculated aortic valve area is 0.45cm. R/LHC showed widely patent coronary arteries with normal right heart hemodynamics.   I have reviewed the natural history of aortic stenosis with the patient. We have discussed the limitations of medical therapy and the poor prognosis associated with symptomatic aortic stenosis. We have reviewed potential treatment options, including palliative medical therapy, conventional surgical aortic valve replacement, and transcatheter aortic valve replacement. We discussed treatment options in the context of this patient's specific comorbid medical conditions.    The patient's predicted risk of mortality with conventional aortic valve replacement is 2.09% primarily based on acute congestive heart failure and age. Other significant comorbid conditions include ongoing tobacco use. Conventional surgery discussed however due to poor family support and work stressors, TAVR seems like a reasonable treatment option for this patient pending formal cardiac surgical consultation. We discussed typical evaluation which will require a gated cardiac CTA and a CTA of the chest/abdomen/pelvis to evaluate both her cardiac anatomy and peripheral vasculature. CT imaging has been ordered and will be performed today. MD to follow with final recommendations.    For questions or updates, please contact [place] consult [email or web address] for contact  info under    Signed, Kathyrn Drown, NP  06/03/2022 10:45 AM    ATTENDING ATTESTATION:  After conducting a review of all available clinical information with the care team, interviewing the patient, and performing a physical exam, I agree with the  findings and plan described in this note.   GEN: No acute distress.   HEENT:  MMM, no JVD, no scleral icterus, +2 carotid impulses Cardiac: RRR, with 3 out of 6 systolic ejection murmur.  Respiratory: Coarse breath sounds bilaterally GI: Soft, nontender, non-distended  MS: No edema; No deformity. Neuro:  Nonfocal  Vasc:  +2 radial pulses, +2 femoral pulses  The patient is a 72 year old female with a history of COPD with ongoing tobacco abuse, hyperlipidemia, restless leg syndrome, and anxiety who was admitted due to presyncope.  The patient has had a few episodes of presyncope in the preceding week.  She currently works full-time at [employer] and has noticed some lightheadedness there at times.  She fortunately has not passed out.  She occasionally gets chest discomfort with exertion and some shortness of breath.  She does wheeze on occasion.  She denies any claudication, signs or symptoms of stroke, paroxysmal nocturnal dyspnea, orthopnea.  I did chance to review her echocardiogram which demonstrates a heavily calcified valve with moderate to severe aortic stenosis.  Her coronary angiography study demonstrates no obstructive coronary artery disease.  Her EKG demonstrates sinus rhythm without bundle-branch blocks.  Of note the patient may harbor a bicuspid valve.  I did tell the patient that if she has heavy asymmetric calcification associated with the valve or an ascending aortic aneurysm that a surgical aortic valve replacement may be her best option.  She did have her CT scans done today but they are not yet available for review.  For now I believe she can be discharged home and we will arrange outpatient cardiothoracic surgery consultation.  The patient has both upper and lower dentures so a dental evaluation is not necessary at this time.  Lenna Sciara, MD Pager [phone]

## 2022-06-01 NOTE — ED Provider Notes (Signed)
[facility] Provider Note  CSN: [id number] Arrival date & time: 06/01/22  [id number]  History  Chief Complaint  Patient presents with   Chest Pain   Near Syncope   Carolyn [name] is a 72 y.o. female.  Carolyn [name] is a 72 yo woman presents today via EMS for chest pain and presyncope.  Recently diagnosed with severe aortic stenosis, is currently a cardiology patient of Dr. Stanford Breed.  Patient reports 2 weeks of intermittent chest pain, heaviness, tightness, and presyncope mainly in the morning.  She reports she can normally "shake it off" with rest in a chair and time, but this morning could not.  PMH includes severe arctic stenosis (peak velocity greater than 4 m/s on echo 12/15/2021); abdominal bruit (with no AAA on [place] 1/29); HLD on statin, tobacco use, elevated BP (not on meds), COPD, restless legs.    Home Medications Prior to Admission medications   Medication Sig Start Date End Date Taking? Authorizing Provider  aspirin 81 MG EC tablet TAKE 1 TABLET BY MOUTH EVERY DAY 11/11/18   Terald Sleeper, PA-C  atorvastatin (LIPITOR) 10 MG tablet TAKE 1 TABLET BY MOUTH EVERY DAY 02/24/22   [name], DO  baclofen (LIORESAL) 10 MG tablet TAKE 1 TABLET BY MOUTH THREE TIMES A DAY 02/24/22   [name], DO  benzonatate (TESSALON) 100 MG capsule Take 1 capsule (100 mg total) by mouth every 8 (eight) hours. 03/24/22   Raylene Everts, MD  BREO ELLIPTA 100-25 MCG/ACT AEPB TAKE 1 PUFF BY MOUTH EVERY DAY 02/24/22   [name], DO  Calcium Carbonate (CALCIUM 600 PO) Take by mouth.    [provider]  clonazePAM (KLONOPIN) 0.5 MG tablet TAKE 1 TABLET BY MOUTH EVERYDAY AT BEDTIME 05/25/22   [name], DO  doxycycline (VIBRAMYCIN) 100 MG capsule Take 1 capsule (100 mg total) by mouth 2 (two) times daily. 03/24/22   Raylene Everts, MD  ferrous sulfate 325 (65 FE) MG EC tablet Take 1 tablet (325 mg total) by mouth daily with breakfast.  03/26/22   Genia Harold, MD  fluticasone (FLONASE) 50 MCG/ACT nasal spray INSTILL 1 SPRAY INTO BOTH NOSTRILS DAILY 04/10/22   [name], DO  Garlic 123XX123 MG CAPS Take by mouth.    [provider]  levothyroxine (SYNTHROID) 100 MCG tablet TAKE 1 TABLET BY MOUTH DAILY BEFORE BREAKFAST. 10/27/21   [name], DO  meloxicam (MOBIC) 15 MG tablet Take 1 tablet (15 mg total) by mouth daily. 05/29/22   Lorenda Peck, DPM  Multiple Vitamins-Minerals (CENTRUM SILVER ADULT 50+ PO) Take 1 tablet by mouth daily.    [provider]  Omega-3 Fatty Acids (FISH OIL) 1000 MG CAPS Take by mouth.    [provider]  pregabalin (LYRICA) 75 MG capsule Take 1 capsule (75 mg total) by mouth at bedtime as needed. 03/25/22   Genia Harold, MD  rOPINIRole (REQUIP) 3 MG tablet TAKE 1 TABLET BY MOUTH AT BEDTIME. 04/10/22   [name], DO     Allergies    Trazodone and nefazodone and Azithromycin    Review of Systems   Review of Systems  Constitutional:  Positive for activity change and fatigue. Negative for chills, diaphoresis and fever.  Respiratory:  Positive for cough, chest tightness and shortness of breath. Negative for wheezing.   Cardiovascular:  Positive for chest pain. Negative for palpitations.  Gastrointestinal:  Negative for abdominal pain.  Neurological:  Positive for dizziness, weakness and light-headedness.  Negative for syncope and headaches.   Physical Exam Updated Vital Signs BP 139/66   Pulse (!) 58   Temp (!) 97.5 F (36.4 C) (Oral)   Resp 14   Ht 5' (1.524 m)   Wt 74.4 kg   SpO2 100%   BMI 32.03 kg/m  Physical Exam Vitals and nursing note reviewed.  Constitutional:      General: She is not in acute distress.    Appearance: She is well-developed and normal weight. She is ill-appearing. She is not toxic-appearing or diaphoretic.  HENT:     Head: Normocephalic and atraumatic.  Cardiovascular:     Rate and Rhythm: Normal rate and regular rhythm.      Heart sounds: Murmur heard.     Systolic murmur is present with a grade of 5/6.  Pulmonary:     Effort: Pulmonary effort is normal. No tachypnea or respiratory distress.     Breath sounds: Normal breath sounds.  Chest:     Chest wall: No mass or tenderness.  Abdominal:     Palpations: Abdomen is soft. There is no mass.     Tenderness: There is no abdominal tenderness. There is no guarding or rebound.  Musculoskeletal:     Cervical back: Normal range of motion.  Skin:    General: Skin is dry.     Capillary Refill: Capillary refill takes less than 2 seconds.  Neurological:     General: No focal deficit present.     Mental Status: She is alert and oriented to person, place, and time.  Psychiatric:        Mood and Affect: Mood normal.        Behavior: Behavior normal.    ED Results / Procedures / Treatments   Labs (all labs ordered are listed, but only abnormal results are displayed) Labs Reviewed  CBC - Abnormal; Notable for the following components:      Result Value   Hemoglobin 15.1 (*)    All other components within normal limits  COMPREHENSIVE METABOLIC PANEL - Abnormal; Notable for the following components:   Total Protein 6.3 (*)    All other components within normal limits  TSH - Abnormal; Notable for the following components:   TSH 8.745 (*)    All other components within normal limits  TROPONIN I (HIGH SENSITIVITY)  TROPONIN I (HIGH SENSITIVITY)   EKG EKG Interpretation  Date/Time:  Monday June 01 2022 09:35:33 EST Ventricular Rate:  62 PR Interval:  196 QRS Duration: 92 QT Interval:  410 QTC Calculation: 417 R Axis:   37 Text Interpretation: Sinus rhythm Probable anteroseptal infarct, old Nonspecific T abnormalities, lateral leads Confirmed by Godfrey Pick [phone]) on 06/01/2022 10:41:58 AM  Radiology DG Chest Port 1 View  Result Date: 06/01/2022 CLINICAL DATA:  Chest pain and near-syncope EXAM: PORTABLE CHEST 1 VIEW COMPARISON:  Chest radiograph dated  06/23/2021, CT chest dated 01/05/2018 FINDINGS: Normal lung volumes. Bibasilar patchy opacities. Left lateral lung scarring. Unchanged irregular radiodensity in the lateral upper left lung in keeping with known calcified pleural plaque. No pleural effusion or pneumothorax. The heart size and mediastinal contours are within normal limits. Postsurgical changes of the right humerus. IMPRESSION: Bibasilar patchy opacities, likely atelectasis. Aspiration or pneumonia can be considered in the appropriate clinical setting. Electronically Signed   By: Darrin Nipper M.D.   On: 06/01/2022 09:44    Procedures Procedures   Medications Ordered in ED Medications  fentaNYL (SUBLIMAZE) injection 25 mcg (has no administration in time  range)  nitroGLYCERIN (NITROGLYN) 2 % ointment 0.5 inch (0.5 inches Topical Given 06/01/22 0934)   ED Course/ Medical Decision Making/ A&P        CHA2DS2-VASc Score: 3    HEART Score: 5                Medical Decision Making 72 year old woman with known history of severe aortic stenosis presents today with 2 weeks of worsening chest pain, tightness, pressure, SOB, and presyncope.  Worst episode this morning, unable to "shake it off" thus presented via EMS.  S/p 6 baby aspirin this morning between home and EMS.  EKG at bedside unchanged from previous, no ST elevation or T wave changes.  Patient given Nitropaste 7.5 mg and fentanyl 25 mcg for pain.  Will obtain CXR, CMP, CBC, TSH, and troponin.  Plan to consult cardiology after first troponin returned given symptomatic AS.  10:30 am: CXR with atelectasis.  Labs returned largely unremarkable.  Troponin negative.  Will redraw second around 11:30 AM.  Otherwise CBC, CMP normal.  TSH somewhat elevated to 8.  Will consult cardiology.  2pm: Second troponin negative and flat.  Awaiting cardiology to evaluate patient -called back cards master and confirmed she is on their consult list, they are simply quite busy today. Given that the other labs  are unremarkable and do not point to an etiology, I suspect patient's symptoms are due to worsening aortic stenosis and that cardiology will want to admit patient.  Will handoff care to oncoming ED physician.  Amount and/or Complexity of Data Reviewed Labs: ordered. Decision-making details documented in ED Course.    Details: CBC, CMP, troponin, TSH Radiology: ordered. Decision-making details documented in ED Course.    Details: CXR ECG/medicine tests: ordered. Decision-making details documented in ED Course.    Details: Twelve-lead EKG  Risk Prescription drug management.   Final Clinical Impression(s) / ED Diagnoses Final diagnoses:  Aortic valve stenosis, etiology of cardiac valve disease unspecified   Rx / DC Orders ED Discharge Orders     None      Ezequiel Essex, MD   Ezequiel Essex, MD 06/01/22 1459    Godfrey Pick, MD 06/01/22 [phone]

## 2022-06-02 ENCOUNTER — Inpatient Hospital Stay (HOSPITAL_COMMUNITY): Payer: No Typology Code available for payment source

## 2022-06-02 ENCOUNTER — Encounter (HOSPITAL_COMMUNITY)
Admission: EM | Disposition: A | Discharge status: Home / Self Care | Payer: Self-pay | Source: Home / Self Care | Attending: Cardiovascular Disease

## 2022-06-02 DIAGNOSIS — I351 Nonrheumatic aortic (valve) insufficiency: Secondary | ICD-10-CM | POA: Diagnosis not present

## 2022-06-02 DIAGNOSIS — Z720 Tobacco use: Secondary | ICD-10-CM | POA: Diagnosis not present

## 2022-06-02 DIAGNOSIS — I200 Unstable angina: Secondary | ICD-10-CM | POA: Diagnosis not present

## 2022-06-02 DIAGNOSIS — I350 Nonrheumatic aortic (valve) stenosis: Secondary | ICD-10-CM | POA: Diagnosis not present

## 2022-06-02 HISTORY — PX: RIGHT HEART CATH AND CORONARY ANGIOGRAPHY: CATH118264

## 2022-06-02 LAB — BASIC METABOLIC PANEL
Anion gap: 11 (ref 5–15)
BUN: 19 mg/dL (ref 8–23)
CO2: 23 mmol/L (ref 22–32)
Calcium: 8.3 mg/dL — ABNORMAL LOW (ref 8.9–10.3)
Chloride: 98 mmol/L (ref 98–111)
Creatinine, Ser: 0.89 mg/dL (ref 0.44–1.00)
GFR, Estimated: >60 mL/min (ref >60)
Glucose, Bld: 101 mg/dL — ABNORMAL HIGH (ref 70–99)
Potassium: 3.7 mmol/L (ref 3.5–5.1)
Sodium: 132 mmol/L — ABNORMAL LOW (ref 135–145)

## 2022-06-02 LAB — POCT I-STAT EG7
Acid-Base Excess: 0 mmol/L (ref 0.0–2.0)
Acid-base deficit: 2 mmol/L (ref 0.0–2.0)
Bicarbonate: 24.1 mmol/L (ref 20.0–28.0)
Bicarbonate: 26.1 mmol/L (ref 20.0–28.0)
Calcium, Ion: 1.05 mmol/L — ABNORMAL LOW (ref 1.15–1.40)
Calcium, Ion: 1.21 mmol/L (ref 1.15–1.40)
HCT: 37 % (ref 36.0–46.0)
HCT: 40 % (ref 36.0–46.0)
Hemoglobin: 12.6 g/dL (ref 12.0–15.0)
Hemoglobin: 13.6 g/dL (ref 12.0–15.0)
O2 Saturation: 59 %
O2 Saturation: 65 %
Potassium: 3.1 mmol/L — ABNORMAL LOW (ref 3.5–5.1)
Potassium: 3.5 mmol/L (ref 3.5–5.1)
Sodium: 141 mmol/L (ref 135–145)
Sodium: 144 mmol/L (ref 135–145)
TCO2: 25 mmol/L (ref 22–32)
TCO2: 28 mmol/L (ref 22–32)
pCO2, Ven: 44.5 mmHg (ref 44–60)
pCO2, Ven: 48.3 mmHg (ref 44–60)
pH, Ven: 7.341 (ref 7.25–7.43)
pH, Ven: 7.341 (ref 7.25–7.43)
pO2, Ven: 33 mmHg (ref 32–45)
pO2, Ven: 36 mmHg (ref 32–45)

## 2022-06-02 LAB — ECHOCARDIOGRAM COMPLETE
AR max vel: 0.43 cm2
AV Area VTI: 0.45 cm2
AV Area mean vel: 0.4 cm2
AV Mean grad: 33.5 mmHg
AV Peak grad: 59.9 mmHg
AV Vena cont: 0.6 cm
Ao pk vel: 3.87 m/s
Area-P 1/2: 2.7 cm2
Height: 60 in
P 1/2 time: 748 msec
S' Lateral: 2.7 cm
Weight: 2716.8 oz

## 2022-06-02 LAB — POCT I-STAT 7, (LYTES, BLD GAS, ICA,H+H)
Acid-Base Excess: 0 mmol/L (ref 0.0–2.0)
Bicarbonate: 25.1 mmol/L (ref 20.0–28.0)
Calcium, Ion: 1.23 mmol/L (ref 1.15–1.40)
HCT: 40 % (ref 36.0–46.0)
Hemoglobin: 13.6 g/dL (ref 12.0–15.0)
O2 Saturation: 88 %
Potassium: 3.7 mmol/L (ref 3.5–5.1)
Sodium: 139 mmol/L (ref 135–145)
TCO2: 26 mmol/L (ref 22–32)
pCO2 arterial: 43.4 mmHg (ref 32–48)
pH, Arterial: 7.37 (ref 7.35–7.45)
pO2, Arterial: 56 mmHg — ABNORMAL LOW (ref 83–108)

## 2022-06-02 LAB — CBC
HCT: 39.4 % (ref 36.0–46.0)
Hemoglobin: 13.6 g/dL (ref 12.0–15.0)
MCH: 30 pg (ref 26.0–34.0)
MCHC: 34.5 g/dL (ref 30.0–36.0)
MCV: 86.8 fL (ref 80.0–100.0)
Platelets: 173 10*3/uL (ref 150–400)
RBC: 4.54 MIL/uL (ref 3.87–5.11)
RDW: 13.7 % (ref 11.5–15.5)
WBC: 6.8 10*3/uL (ref 4.0–10.5)
nRBC: 0 % (ref 0.0–0.2)

## 2022-06-02 LAB — PROTIME-INR
INR: 1.1 (ref 0.8–1.2)
Prothrombin Time: 13.8 seconds (ref 11.4–15.2)

## 2022-06-02 LAB — LIPID PANEL
Cholesterol: 135 mg/dL (ref 0–200)
HDL: 73 mg/dL (ref >40)
LDL Cholesterol: 49 mg/dL (ref 0–99)
Total CHOL/HDL Ratio: 1.8 RATIO
Triglycerides: 67 mg/dL (ref <150)
VLDL: 13 mg/dL (ref 0–40)

## 2022-06-02 LAB — T4, FREE: Free T4: 1.15 ng/dL — ABNORMAL HIGH (ref 0.61–1.12)

## 2022-06-02 LAB — TSH: TSH: 7.239 u[IU]/mL — ABNORMAL HIGH (ref 0.350–4.500)

## 2022-06-02 SURGERY — RIGHT HEART CATH AND CORONARY ANGIOGRAPHY
Anesthesia: LOCAL

## 2022-06-02 MED ORDER — MIDAZOLAM HCL 2 MG/2ML IJ SOLN
INTRAMUSCULAR | Status: DC | PRN
  Administered 2022-06-02: 2 mg via INTRAVENOUS

## 2022-06-02 MED ORDER — HEPARIN SODIUM (PORCINE) 1000 UNIT/ML IJ SOLN
INTRAMUSCULAR | Status: DC | PRN
  Administered 2022-06-02: 4000 [IU] via INTRAVENOUS

## 2022-06-02 MED ORDER — IOHEXOL 350 MG/ML SOLN
INTRAVENOUS | Status: DC | PRN
  Administered 2022-06-02: 35 mL

## 2022-06-02 MED ORDER — LIDOCAINE HCL (PF) 1 % IJ SOLN
INTRAMUSCULAR | Status: DC | PRN
  Administered 2022-06-02: 5 mL via INTRADERMAL

## 2022-06-02 MED ORDER — VERAPAMIL HCL 2.5 MG/ML IV SOLN
INTRAVENOUS | Status: AC
  Filled 2022-06-02: qty 2

## 2022-06-02 MED ORDER — HEPARIN (PORCINE) IN NACL 1000-0.9 UT/500ML-% IV SOLN
INTRAVENOUS | Status: DC | PRN
  Administered 2022-06-02 (×2): 500 mL

## 2022-06-02 MED ORDER — MIDAZOLAM HCL 2 MG/2ML IJ SOLN
INTRAMUSCULAR | Status: AC
  Filled 2022-06-02: qty 2

## 2022-06-02 MED ORDER — VERAPAMIL HCL 2.5 MG/ML IV SOLN
INTRAVENOUS | Status: DC | PRN
  Administered 2022-06-02: 10 mL via INTRA_ARTERIAL

## 2022-06-02 MED ORDER — LIDOCAINE HCL (PF) 1 % IJ SOLN
INTRAMUSCULAR | Status: AC
  Filled 2022-06-02: qty 30

## 2022-06-02 MED ORDER — FENTANYL CITRATE (PF) 100 MCG/2ML IJ SOLN
INTRAMUSCULAR | Status: DC | PRN
  Administered 2022-06-02: 25 ug via INTRAVENOUS

## 2022-06-02 MED ORDER — HEPARIN SODIUM (PORCINE) 1000 UNIT/ML IJ SOLN
INTRAMUSCULAR | Status: AC
  Filled 2022-06-02: qty 10

## 2022-06-02 MED ORDER — FENTANYL CITRATE (PF) 100 MCG/2ML IJ SOLN
INTRAMUSCULAR | Status: AC
  Filled 2022-06-02: qty 2

## 2022-06-02 SURGICAL SUPPLY — 14 items
BAND CMPR LRG ZPHR (HEMOSTASIS) ×1
BAND ZEPHYR COMPRESS 30 LONG (HEMOSTASIS) IMPLANT
CATH 5FR JL3.5 JR4 ANG PIG MP (CATHETERS) IMPLANT
CATH BALLN WEDGE 5F 110CM (CATHETERS) IMPLANT
GLIDESHEATH SLEND SS 6F .021 (SHEATH) IMPLANT
GUIDEWIRE INQWIRE 1.5J.035X260 (WIRE) IMPLANT
INQWIRE 1.5J .035X260CM (WIRE) ×1
KIT HEART LEFT (KITS) ×1 IMPLANT
PACK CARDIAC CATHETERIZATION (CUSTOM PROCEDURE TRAY) ×1 IMPLANT
SHEATH GLIDE SLENDER 4/5FR (SHEATH) IMPLANT
SHEATH PROBE COVER 6X72 (BAG) IMPLANT
TRANSDUCER W/STOPCOCK (MISCELLANEOUS) ×1 IMPLANT
TUBING CIL FLEX 10 FLL-RA (TUBING) ×1 IMPLANT
WIRE EMERALD 3MM-J .025X260CM (WIRE) IMPLANT

## 2022-06-02 NOTE — H&P (View-Only) (Signed)
Rounding Note    Patient Name: Carolyn [name] Date of Encounter: 06/02/2022  [place] Cardiologist: Kirk Ruths, MD   Subjective   No chest pain, no SOB, lying flat in bed  Inpatient Medications    Scheduled Meds:  atorvastatin  10 mg Oral Daily   baclofen  10 mg Oral TID   fluticasone furoate-vilanterol  1 puff Inhalation Daily   heparin  5,000 Units Subcutaneous Q8H   levothyroxine  100 mcg Oral QAC breakfast   pregabalin  75 mg Oral Daily   rOPINIRole  3 mg Oral QHS   sodium chloride flush  3 mL Intravenous Q12H   Continuous Infusions:  sodium chloride     sodium chloride 10 mL/hr at 06/02/22 0931   PRN Meds: sodium chloride, acetaminophen, clonazePAM, fentaNYL (SUBLIMAZE) injection, ondansetron (ZOFRAN) IV, sodium chloride flush   Vital Signs    Vitals:   06/02/22 0042 06/02/22 0326 06/02/22 0800 06/02/22 0810  BP: (!) 98/47 121/74 105/85   Pulse: (!) 55 (!) 56 61   Resp: '18 18 18   '$ Temp: 98 F (36.7 C) 97.6 F (36.4 C) 98 F (36.7 C)   TempSrc: Oral Oral Oral   SpO2:  94% 98% 97%  Weight: 77 kg 77 kg    Height:        Intake/Output Summary (Last 24 hours) at 06/02/2022 0958 Last data filed at 06/02/2022 0806 Gross per 24 hour  Intake 0 ml  Output 800 ml  Net -800 ml      06/02/2022    3:26 AM 06/02/2022   12:42 AM 06/01/2022    9:16 AM  Last 3 Weights  Weight (lbs) 169 lb 12.8 oz 169 lb 12.8 oz 164 lb  Weight (kg) 77.021 kg 77.021 kg 74.39 kg      Telemetry    SR  - Personally Reviewed  ECG    No new - Personally Reviewed  Physical Exam   GEN: No acute distress.   Neck: No JVD Cardiac: RRR, 3/6 systolic murmur, no rubs, or gallops.  Respiratory: + rhonchi no rales to auscultation bilaterally. GI: Soft, nontender, non-distended  MS: No edema; No deformity. Neuro:  Nonfocal  Psych: Normal affect   Labs    High Sensitivity Troponin:   Recent Labs  Lab 06/01/22 0923 06/01/22 1219  TROPONINIHS 8 6      Chemistry Recent Labs  Lab 06/01/22 0923 06/01/22 1628 06/02/22 0208  NA 135  --  132*  K 4.1  --  3.7  CL 102  --  98  CO2 25  --  23  GLUCOSE 98  --  101*  BUN 17  --  19  CREATININE 0.90 0.81 0.89  CALCIUM 9.2  --  8.3*  PROT 6.3*  --   --   ALBUMIN 3.7  --   --   AST 29  --   --   ALT 26  --   --   ALKPHOS 70  --   --   BILITOT 0.7  --   --   GFRNONAA >60 >60 >60  ANIONGAP 8  --  11    Lipids  Recent Labs  Lab 06/02/22 0208  CHOL 135  TRIG 67  HDL 73  LDLCALC 49  CHOLHDL 1.8    Hematology Recent Labs  Lab 06/01/22 0923 06/01/22 1628 06/02/22 0208  WBC 9.0 7.8 6.8  RBC 5.05 4.78 4.54  HGB 15.1* 14.3 13.6  HCT 45.2 42.2 39.4  MCV 89.5 88.3 86.8  MCH 29.9 29.9 30.0  MCHC 33.4 33.9 34.5  RDW 14.0 13.9 13.7  PLT 180 173 173   Thyroid  Recent Labs  Lab 06/02/22 0208  TSH 7.239*  FREET4 1.15*    BNP Recent Labs  Lab 06/01/22 1628  BNP 212.5*    DDimer No results for input(s): "DDIMER" in the last 168 hours.   Radiology    DG Chest Port 1 View  Result Date: 06/01/2022 CLINICAL DATA:  Chest pain and near-syncope EXAM: PORTABLE CHEST 1 VIEW COMPARISON:  Chest radiograph dated 06/23/2021, CT chest dated 01/05/2018 FINDINGS: Normal lung volumes. Bibasilar patchy opacities. Left lateral lung scarring. Unchanged irregular radiodensity in the lateral upper left lung in keeping with known calcified pleural plaque. No pleural effusion or pneumothorax. The heart size and mediastinal contours are within normal limits. Postsurgical changes of the right humerus. IMPRESSION: Bibasilar patchy opacities, likely atelectasis. Aspiration or pneumonia can be considered in the appropriate clinical setting. Electronically Signed   By: Darrin Nipper M.D.   On: 06/01/2022 09:44    Cardiac Studies  Echo and cath pending  Echo from 12/15/21:   1. Left ventricular ejection fraction, by estimation, is 60 to 65%. The  left ventricle has normal function. The left ventricle has  no regional  wall motion abnormalities. Left ventricular diastolic parameters are  consistent with Grade I diastolic  dysfunction (impaired relaxation).   2. Right ventricular systolic function is normal. The right ventricular  size is normal. There is normal pulmonary artery systolic pressure.   3. Left atrial size was moderately dilated.   4. The mitral valve is grossly normal. No evidence of mitral valve  regurgitation.   5. The aortic valve is calcified. There is moderate calcification of the  aortic valve. Aortic valve regurgitation is mild. Moderate to severe  aortic valve stenosis. Aortic regurgitation PHT measures 479 msec. Aortic  valve area, by VTI measures 1.17 cm.   Aortic valve mean gradient measures 39.0 mmHg. Aortic valve Vmax measures  4.09 m/s. Peak gradient 67 mmHg. DI is 0.37.   6. The inferior vena cava is normal in size with greater than 50%  respiratory variability, suggesting right atrial pressure of 3 mmHg.   7. Evidence of atrial level shunting detected by color flow Doppler.  There is a small patent foramen ovale with predominantly left to right  shunting across the atrial septum.   Comparison(s): No prior Echocardiogram.   Patient Profile     72 y.o. female  hx of COPD,  hypothyroidism, anxiety, severe aortic stenosis, HLD, tobacco use, RLS admitted 06/01/22 with chest pain and presyncope.    Assessment & Plan    Severe AS Chest pain Exertional dyspnea  Near syncope --mean gradient was 39 mmHg 12/2021, asymptomatic at that time,  now with chest pain and near syncope.  Plan for echo and LHC and RHC,  probable TAVR eval.   Labs stable for cath.   --Hs troponin 8 and 6 neg MI  Acute diastolic HF  --mild, IV lasix yesterday  for Lt and RHC today --BNP 212 --unsure about I&O but neg 800  COPD Tobacco abuse --cessation advised.  She does not believe she will stop  HLD  --lipitor 10 --HDL 73, LDL 49 this admit  Anxiety/RLS  --continue home  meds.  Abnormal TSH 7.239 with free T4 1.15   For questions or updates, please contact [place] Please consult [email or web address] for contact info under  Signed, Cecilie Kicks, NP  06/02/2022, 9:58 AM

## 2022-06-02 NOTE — Progress Notes (Signed)
Rounding Note    Patient Name: Carolyn [name] Date of Encounter: 06/02/2022  [facility] Cardiologist: Kirk Ruths, MD   Subjective   No chest pain, no SOB, lying flat in bed  Inpatient Medications    Scheduled Meds:  atorvastatin  10 mg Oral Daily   baclofen  10 mg Oral TID   fluticasone furoate-vilanterol  1 puff Inhalation Daily   heparin  5,000 Units Subcutaneous Q8H   levothyroxine  100 mcg Oral QAC breakfast   pregabalin  75 mg Oral Daily   rOPINIRole  3 mg Oral QHS   sodium chloride flush  3 mL Intravenous Q12H   Continuous Infusions:  sodium chloride     sodium chloride 10 mL/hr at 06/02/22 0931   PRN Meds: sodium chloride, acetaminophen, clonazePAM, fentaNYL (SUBLIMAZE) injection, ondansetron (ZOFRAN) IV, sodium chloride flush   Vital Signs    Vitals:   06/02/22 0042 06/02/22 0326 06/02/22 0800 06/02/22 0810  BP: (!) 98/47 121/74 105/85   Pulse: (!) 55 (!) 56 61   Resp: '18 18 18   '$ Temp: 98 F (36.7 C) 97.6 F (36.4 C) 98 F (36.7 C)   TempSrc: Oral Oral Oral   SpO2:  94% 98% 97%  Weight: 77 kg 77 kg    Height:        Intake/Output Summary (Last 24 hours) at 06/02/2022 0958 Last data filed at 06/02/2022 0806 Gross per 24 hour  Intake 0 ml  Output 800 ml  Net -800 ml      06/02/2022    3:26 AM 06/02/2022   12:42 AM 06/01/2022    9:16 AM  Last 3 Weights  Weight (lbs) 169 lb 12.8 oz 169 lb 12.8 oz 164 lb  Weight (kg) 77.021 kg 77.021 kg 74.39 kg      Telemetry    SR  - Personally Reviewed  ECG    No new - Personally Reviewed  Physical Exam   GEN: No acute distress.   Neck: No JVD Cardiac: RRR, 3/6 systolic murmur, no rubs, or gallops.  Respiratory: + rhonchi no rales to auscultation bilaterally. GI: Soft, nontender, non-distended  MS: No edema; No deformity. Neuro:  Nonfocal  Psych: Normal affect   Labs    High Sensitivity Troponin:   Recent Labs  Lab 06/01/22 0923 06/01/22 1219  TROPONINIHS 8 6      Chemistry Recent Labs  Lab 06/01/22 0923 06/01/22 1628 06/02/22 0208  NA 135  --  132*  K 4.1  --  3.7  CL 102  --  98  CO2 25  --  23  GLUCOSE 98  --  101*  BUN 17  --  19  CREATININE 0.90 0.81 0.89  CALCIUM 9.2  --  8.3*  PROT 6.3*  --   --   ALBUMIN 3.7  --   --   AST 29  --   --   ALT 26  --   --   ALKPHOS 70  --   --   BILITOT 0.7  --   --   GFRNONAA >60 >60 >60  ANIONGAP 8  --  11    Lipids  Recent Labs  Lab 06/02/22 0208  CHOL 135  TRIG 67  HDL 73  LDLCALC 49  CHOLHDL 1.8    Hematology Recent Labs  Lab 06/01/22 0923 06/01/22 1628 06/02/22 0208  WBC 9.0 7.8 6.8  RBC 5.05 4.78 4.54  HGB 15.1* 14.3 13.6  HCT 45.2 42.2 39.4  MCV 89.5 88.3 86.8  MCH 29.9 29.9 30.0  MCHC 33.4 33.9 34.5  RDW 14.0 13.9 13.7  PLT 180 173 173   Thyroid  Recent Labs  Lab 06/02/22 0208  TSH 7.239*  FREET4 1.15*    BNP Recent Labs  Lab 06/01/22 1628  BNP 212.5*    DDimer No results for input(s): "DDIMER" in the last 168 hours.   Radiology    DG Chest Port 1 View  Result Date: 06/01/2022 CLINICAL DATA:  Chest pain and near-syncope EXAM: PORTABLE CHEST 1 VIEW COMPARISON:  Chest radiograph dated 06/23/2021, CT chest dated 01/05/2018 FINDINGS: Normal lung volumes. Bibasilar patchy opacities. Left lateral lung scarring. Unchanged irregular radiodensity in the lateral upper left lung in keeping with known calcified pleural plaque. No pleural effusion or pneumothorax. The heart size and mediastinal contours are within normal limits. Postsurgical changes of the right humerus. IMPRESSION: Bibasilar patchy opacities, likely atelectasis. Aspiration or pneumonia can be considered in the appropriate clinical setting. Electronically Signed   By: Darrin Nipper M.D.   On: 06/01/2022 09:44    Cardiac Studies  Echo and cath pending  Echo from 12/15/21:   1. Left ventricular ejection fraction, by estimation, is 60 to 65%. The  left ventricle has normal function. The left ventricle has  no regional  wall motion abnormalities. Left ventricular diastolic parameters are  consistent with Grade I diastolic  dysfunction (impaired relaxation).   2. Right ventricular systolic function is normal. The right ventricular  size is normal. There is normal pulmonary artery systolic pressure.   3. Left atrial size was moderately dilated.   4. The mitral valve is grossly normal. No evidence of mitral valve  regurgitation.   5. The aortic valve is calcified. There is moderate calcification of the  aortic valve. Aortic valve regurgitation is mild. Moderate to severe  aortic valve stenosis. Aortic regurgitation PHT measures 479 msec. Aortic  valve area, by VTI measures 1.17 cm.   Aortic valve mean gradient measures 39.0 mmHg. Aortic valve Vmax measures  4.09 m/s. Peak gradient 67 mmHg. DI is 0.37.   6. The inferior vena cava is normal in size with greater than 50%  respiratory variability, suggesting right atrial pressure of 3 mmHg.   7. Evidence of atrial level shunting detected by color flow Doppler.  There is a small patent foramen ovale with predominantly left to right  shunting across the atrial septum.   Comparison(s): No prior Echocardiogram.   Patient Profile     72 y.o. female  hx of COPD,  hypothyroidism, anxiety, severe aortic stenosis, HLD, tobacco use, RLS admitted 06/01/22 with chest pain and presyncope.    Assessment & Plan    Severe AS Chest pain Exertional dyspnea  Near syncope --mean gradient was 39 mmHg 12/2021, asymptomatic at that time,  now with chest pain and near syncope.  Plan for echo and LHC and RHC,  probable TAVR eval.   Labs stable for cath.   --Hs troponin 8 and 6 neg MI  Acute diastolic HF  --mild, IV lasix yesterday  for Lt and RHC today --BNP 212 --unsure about I&O but neg 800  COPD Tobacco abuse --cessation advised.  She does not believe she will stop  HLD  --lipitor 10 --HDL 73, LDL 49 this admit  Anxiety/RLS  --continue home  meds.  Abnormal TSH 7.239 with free T4 1.15   For questions or updates, please contact [place] Please consult [email or web address] for contact info under  Signed, Cecilie Kicks, NP  06/02/2022, 9:58 AM

## 2022-06-02 NOTE — Interval H&P Note (Signed)
History and Physical Interval Note:  06/02/2022 3:26 PM  Carolyn [name]  has presented today for surgery, with the diagnosis of aortic stenosis.  The various methods of treatment have been discussed with the patient and family. After consideration of risks, benefits and other options for treatment, the patient has consented to  Procedure(s): RIGHT/LEFT HEART CATH AND CORONARY ANGIOGRAPHY (N/A) as a surgical intervention.  The patient's history has been reviewed, patient examined, no change in status, stable for surgery.  I have reviewed the patient's chart and labs.  Questions were answered to the patient's satisfaction.     Sherren Mocha

## 2022-06-03 ENCOUNTER — Encounter (HOSPITAL_COMMUNITY): Payer: Self-pay | Admitting: Cardiovascular Disease

## 2022-06-03 ENCOUNTER — Inpatient Hospital Stay (HOSPITAL_COMMUNITY): Payer: No Typology Code available for payment source

## 2022-06-03 DIAGNOSIS — Z720 Tobacco use: Secondary | ICD-10-CM | POA: Diagnosis not present

## 2022-06-03 DIAGNOSIS — I350 Nonrheumatic aortic (valve) stenosis: Secondary | ICD-10-CM | POA: Diagnosis not present

## 2022-06-03 LAB — BASIC METABOLIC PANEL
Anion gap: 12 (ref 5–15)
BUN: 13 mg/dL (ref 8–23)
CO2: 20 mmol/L — ABNORMAL LOW (ref 22–32)
Calcium: 8.6 mg/dL — ABNORMAL LOW (ref 8.9–10.3)
Chloride: 103 mmol/L (ref 98–111)
Creatinine, Ser: 0.8 mg/dL (ref 0.44–1.00)
GFR, Estimated: >60 mL/min (ref >60)
Glucose, Bld: 99 mg/dL (ref 70–99)
Potassium: 4.1 mmol/L (ref 3.5–5.1)
Sodium: 135 mmol/L (ref 135–145)

## 2022-06-03 LAB — LIPOPROTEIN A (LPA): Lipoprotein (a): 27.5 nmol/L (ref <75.0)

## 2022-06-03 MED ORDER — IOHEXOL 350 MG/ML SOLN
100.0000 mL | Freq: Once | INTRAVENOUS | Status: AC | PRN
  Administered 2022-06-03: 100 mL via INTRAVENOUS

## 2022-06-03 NOTE — Progress Notes (Signed)
Pt refusing SQ Heparin stating she can get up and walk and she is going home today. Jessie Foot, RN

## 2022-06-03 NOTE — Care Management (Signed)
  Transition of Care [facility]) Screening Note   Patient Details  Name: Deveda Tenbrink Date of Birth: Mar 27, 1951   Transition of Care [facility]) CM/SW Contact:    Bethena Roys, RN Phone Number: 06/03/2022, 9:47 AM    Transition of Care Department [facility]) has reviewed the patient and no TOC needs have been identified at this time. Post right/left heart cath 06-02-22-possible TAVR evaluation. We will continue to monitor patient advancement through interdisciplinary progression rounds. If new patient transition needs arise, please place a TOC consult.

## 2022-06-03 NOTE — Plan of Care (Signed)
  Problem: Education: Goal: Understanding of cardiac disease, CV risk reduction, and recovery process will improve Outcome: Adequate for Discharge Goal: Individualized Educational Video(s) Outcome: Adequate for Discharge   Problem: Activity: Goal: Ability to tolerate increased activity will improve Outcome: Adequate for Discharge   Problem: Cardiac: Goal: Ability to achieve and maintain adequate cardiovascular perfusion will improve Outcome: Adequate for Discharge   Problem: Health Behavior/Discharge Planning: Goal: Ability to safely manage health-related needs after discharge will improve Outcome: Adequate for Discharge   Problem: Education: Goal: Understanding of CV disease, CV risk reduction, and recovery process will improve Outcome: Adequate for Discharge Goal: Individualized Educational Video(s) Outcome: Adequate for Discharge   Problem: Activity: Goal: Ability to return to baseline activity level will improve Outcome: Adequate for Discharge   Problem: Cardiovascular: Goal: Ability to achieve and maintain adequate cardiovascular perfusion will improve Outcome: Adequate for Discharge Goal: Vascular access site(s) Level 0-1 will be maintained Outcome: Adequate for Discharge   Problem: Health Behavior/Discharge Planning: Goal: Ability to safely manage health-related needs after discharge will improve Outcome: Adequate for Discharge   Problem: Education: Goal: Knowledge of General Education information will improve Description: Including pain rating scale, medication(s)/side effects and non-pharmacologic comfort measures Outcome: Adequate for Discharge   Problem: Health Behavior/Discharge Planning: Goal: Ability to manage health-related needs will improve Outcome: Adequate for Discharge   Problem: Clinical Measurements: Goal: Ability to maintain clinical measurements within normal limits will improve Outcome: Adequate for Discharge Goal: Will remain free from  infection Outcome: Adequate for Discharge Goal: Diagnostic test results will improve Outcome: Adequate for Discharge Goal: Respiratory complications will improve Outcome: Adequate for Discharge Goal: Cardiovascular complication will be avoided Outcome: Adequate for Discharge   Problem: Activity: Goal: Risk for activity intolerance will decrease Outcome: Adequate for Discharge   Problem: Nutrition: Goal: Adequate nutrition will be maintained Outcome: Adequate for Discharge   Problem: Coping: Goal: Level of anxiety will decrease Outcome: Adequate for Discharge   Problem: Elimination: Goal: Will not experience complications related to bowel motility Outcome: Adequate for Discharge Goal: Will not experience complications related to urinary retention Outcome: Adequate for Discharge   Problem: Pain Managment: Goal: General experience of comfort will improve Outcome: Adequate for Discharge   Problem: Safety: Goal: Ability to remain free from injury will improve Outcome: Adequate for Discharge   Problem: Skin Integrity: Goal: Risk for impaired skin integrity will decrease Outcome: Adequate for Discharge

## 2022-06-03 NOTE — Discharge Summary (Signed)
Discharge Summary    Patient ID: Carolyn [name] MRN: [id number]; DOB: 1950-09-15  Admit date: 06/01/2022 Discharge date: 06/03/2022  PCP:  [name], [facility] Cardiologist:  Kirk Ruths, MD        Discharge Diagnoses    Principal Problem:   Severe aortic stenosis Active Problems:   Tobacco abuse   Unstable angina [facility])    Diagnostic Studies/Procedures      06/02/22 LHC/RHC   1.  Widely patent coronary arteries with mild nonobstructive plaquing in the mid LAD and otherwise normal coronary arteries 2.  Normal right heart hemodynamics with RA pressure mean of 2 mmHg, PA pressure 25/7 with a mean of 17 mmHg, and wedge pressure of 7 mmHg.  Preserved cardiac output at 4.8 L/min and cardiac index of 2.8 L/min/m. 3.  Severe aortic stenosis by echo assessment   Recommendations: The patient appears to be very well compensated.  Continue evaluation for treatment of symptomatic aortic stenosis.   06/02/22 TTE   IMPRESSIONS     1. Left ventricular ejection fraction, by estimation, is 65 to 70%. The  left ventricle has normal function. The left ventricle has no regional  wall motion abnormalities. There is mild left ventricular hypertrophy.  Left ventricular diastolic parameters  are consistent with Grade I diastolic dysfunction (impaired relaxation).   2. Right ventricular systolic function is normal. The right ventricular  size is normal. Tricuspid regurgitation signal is inadequate for assessing  PA pressure.   3. Left atrial size was upper normal.   4. The mitral valve is grossly normal, mildly calcified. Trivial mitral  valve regurgitation.   5. 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 There is moderate  calcification of the aortic valve. Aortic valve regurgitation is mild to  moderate. Moderate to severe aortic valve stenosis, upper end of scale.  Aortic regurgitation PHT measures 748 msec.  Aortic valve mean gradient measures  33.5 mmHg. Dimentionless index 0.34.   6. The inferior vena cava is normal in size with <50% respiratory  variability, suggesting right atrial pressure of 8 mmHg.   Comparison(s): Prior images reviewed side by side. Aortic valve is  functionally bicuspid with evidence of moderate to severe stenosis, mean  AV gradient up to 34 mmHg and dimentionless index 0.34.   FINDINGS   Left Ventricle: Left ventricular ejection fraction, by estimation, is 65  to 70%. The left ventricle has normal function. The left ventricle has no  regional wall motion abnormalities. The left ventricular internal cavity  size was normal in size. There is   mild left ventricular hypertrophy. Left ventricular diastolic parameters  are consistent with Grade I diastolic dysfunction (impaired relaxation).   Right Ventricle: The right ventricular size is normal. No increase in  right ventricular wall thickness. Right ventricular systolic function is  normal. Tricuspid regurgitation signal is inadequate for assessing PA  pressure.   Left Atrium: Left atrial size was upper normal.   Right Atrium: Right atrial size was normal in size.   Pericardium: There is no evidence of pericardial effusion.   Mitral Valve: The mitral valve is grossly normal. There is mild  calcification of the mitral valve leaflet(s). Trivial mitral valve  regurgitation.   Tricuspid Valve: The tricuspid valve is grossly normal. Tricuspid valve  regurgitation is trivial.   Aortic Valve: The aortic valve is bicuspid. There is moderate  calcification of the aortic valve. There is mild to moderate aortic valve  annular calcification. Aortic valve regurgitation is mild  to moderate.  Aortic regurgitation PHT measures 748 msec.  Moderate to severe aortic stenosis is present. Aortic valve mean gradient  measures 33.5 mmHg. Aortic valve peak gradient measures 59.9 mmHg. Aortic  valve area, by VTI measures 0.45 cm.   Pulmonic Valve: The pulmonic valve  was grossly normal. Pulmonic valve  regurgitation is trivial.   Aorta: The aortic root is normal in size and structure.   Venous: The inferior vena cava is normal in size with less than 50%  respiratory variability, suggesting right atrial pressure of 8 mmHg.   IAS/Shunts: No atrial level shunt detected by color flow Doppler.    _____________   History of Present Illness     Carolyn [name] is a 72 y.o. female with hx of COPD,  hypothyroidism, anxiety, severe aortic stenosis, HLD, tobacco use, RLS. Patient admitted with symptoms of chest pain, presyncope, exertional limitation.  Hospital Course     Consultants: structural heart team   Moderate to severe Aortic stenosis with heavily calcified valve HFpEF  Patient admitted with symptoms of progressive exertional dyspnea, pre-syncope, chest pain, concerning for progressing aortic stenosis. BNP found elevated at 212.5. Troponin 6->8. TTE showed LVEF 65-70%, moderate to severe AS with mean gradient 33.65mHg, peak gradient 59.919mg. VTI 0.45. LHC/RHC with mild non-obstructive plaque in LAD, normal right heart hemodynamics with RA pressure mean of 2 mmHg, PA pressure 25/7 with a mean of 17 mmHg, and wedge pressure of 7 mmHg.  Preserved cardiac output at 4.8 L/min and cardiac index of 2.8 L/min/m. Patient seen by our structural team who felt that she may benefit from valve replacement. 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 CT angiography chest aorta and cardiac CT completed while admitted. Patient is scheduled to see Dr. BaCyndia Bentn Tuesday, June 09, 2022 at 3:00 PM.  Hyperlipidemia   Continue Atorvastatin '10mg'$   Hypothyroidism   TSH elevated to 7.239, free T4 1.15. T3 is in process. Will continue home Levothyroxine dosing for now.   COPD   Continue home inhaler regimen. Complete smoking cessation strongly encouraged.    Anxiety Restless leg syndrome   Continue home Clonazepam and Baclofen       Did the  patient have an acute coronary syndrome (MI, NSTEMI, STEMI, etc) this admission?:  No                               Did the patient have a percutaneous coronary intervention (stent / angioplasty)?:  No.          _____________  Discharge Vitals Blood pressure 123/61, pulse 64, temperature 97.7 F (36.5 C), temperature source Oral, resp. rate 14, height 5' (1.524 m), weight 77 kg, SpO2 96 %.  Filed Weights   06/01/22 0916 06/02/22 0042 06/02/22 0326  Weight: 74.4 kg 77 kg 77 kg    Labs & Radiologic Studies    CBC Recent Labs    06/01/22 1628 06/02/22 0208 06/02/22 1548 06/02/22 1555  WBC 7.8 6.8  --   --   HGB 14.3 13.6 12.6  13.6 13.6  HCT 42.2 39.4 37.0  40.0 40.0  MCV 88.3 86.8  --   --   PLT 173 173  --   --    Basic Metabolic Panel Recent Labs    06/02/22 0208 06/02/22 1548 06/02/22 1555 06/03/22 1041  NA 132*   < > 139 135  K 3.7   < > 3.7 4.1  CL  98  --   --  103  CO2 23  --   --  20*  GLUCOSE 101*  --   --  99  BUN 19  --   --  13  CREATININE 0.89  --   --  0.80  CALCIUM 8.3*  --   --  8.6*   < > = values in this interval not displayed.   Liver Function Tests Recent Labs    06/01/22 0923  AST 29  ALT 26  ALKPHOS 70  BILITOT 0.7  PROT 6.3*  ALBUMIN 3.7   No results for input(s): "LIPASE", "AMYLASE" in the last 72 hours. High Sensitivity Troponin:   Recent Labs  Lab 06/01/22 0923 06/01/22 1219  TROPONINIHS 8 6    BNP Invalid input(s): "POCBNP" D-Dimer No results for input(s): "DDIMER" in the last 72 hours. Hemoglobin A1C No results for input(s): "HGBA1C" in the last 72 hours. Fasting Lipid Panel Recent Labs    06/02/22 0208  CHOL 135  HDL 73  LDLCALC 49  TRIG 67  CHOLHDL 1.8   Thyroid Function Tests Recent Labs    06/02/22 0208  TSH 7.239*   _____________  CT ANGIO ABDOMEN PELVIS  W &/OR WO CONTRAST  Result Date: 06/03/2022 CLINICAL DATA:  72 year old female with history of severe aortic stenosis. Preprocedural study  prior to potential transcatheter aortic valve replacement (TAVR) procedure. EXAM: CT ANGIOGRAPHY CHEST, ABDOMEN AND PELVIS TECHNIQUE: Multidetector CT imaging through the chest, abdomen and pelvis was performed using the standard protocol during bolus administration of intravenous contrast. Multiplanar reconstructed images and MIPs were obtained and reviewed to evaluate the vascular anatomy. RADIATION DOSE REDUCTION: This exam was performed according to the departmental dose-optimization program which includes automated exposure control, adjustment of the mA and/or kV according to patient size and/or use of iterative reconstruction technique. CONTRAST:  186m OMNIPAQUE IOHEXOL 350 MG/ML SOLN COMPARISON:  None Available. FINDINGS: CTA CHEST FINDINGS Cardiovascular: Heart size is normal. There is no significant pericardial fluid, thickening or pericardial calcification. Aortic atherosclerosis. No definite coronary artery calcifications. Thickening and calcification of the aortic valve. Mediastinum/Lymph Nodes: No pathologically enlarged mediastinal or hilar lymph nodes. Esophagus is unremarkable in appearance. No axillary lymphadenopathy. Lungs/Pleura: Scattered areas of linear architectural distortion in the lungs bilaterally (left-greater-than-right), most compatible with areas of chronic post infectious or inflammatory scarring. Multiple calcified pleural plaques in the left hemithorax are noted. No definite calcified right-sided pleural plaques. No acute consolidative airspace disease. No pleural effusions. A few scattered small pulmonary nodules are noted, largest of which is in the periphery of the right upper lobe (axial image 36 of series 5) measuring 4 mm. No larger more suspicious appearing pulmonary nodules or masses are noted. Musculoskeletal/Soft Tissues: There are no aggressive appearing lytic or blastic lesions noted in the visualized portions of the skeleton. CTA ABDOMEN AND PELVIS FINDINGS  Hepatobiliary: 8 mm low-attenuation lesion in the central aspect of segment 4A of the liver, too small to definitively characterize, but statistically likely tiny cysts (no imaging follow-up recommended). No other suspicious hepatic lesions are noted. No intra or extrahepatic biliary ductal dilatation. Gallbladder is normal in appearance. Pancreas: There are multiple low-attenuation regions noted throughout the pancreas, most evident in the body and tail, likely to represent extensive side branch ectasia and/or small cystic lesions, poorly evaluated on today's CT examination. No discrete solid appearing mass is identified. Diffuse dilatation of the pancreatic duct measuring up to 6 mm in the neck of the pancreas is noted. No  peripancreatic fluid collections or inflammatory changes are noted. Spleen: Unremarkable. Adrenals/Urinary Tract: Bilateral kidneys and bilateral adrenal glands are normal in appearance. No hydroureteronephrosis. Urinary bladder is normal in appearance. Stomach/Bowel: The appearance of the stomach is normal. No pathologic dilatation of small bowel or colon. Normal appendix. Vascular/Lymphatic: Atherosclerosis in the abdominal aorta and pelvic vasculature. No lymphadenopathy noted in the abdomen or pelvis. Reproductive: Uterus and ovaries are unremarkable in appearance. Other: No significant volume of ascites.  No pneumoperitoneum. Musculoskeletal: There are no aggressive appearing lytic or blastic lesions noted in the visualized portions of the skeleton. VASCULAR MEASUREMENTS PERTINENT TO TAVR: AORTA: Minimal Aortic Diameter-13 x 13 mm Severity of Aortic Calcification-mild-to-moderate RIGHT PELVIS: Right Common Iliac Artery - Minimal Diameter-7.1 x 8.6 mm Tortuosity-mild Calcification-mild Right External Iliac Artery - Minimal Diameter-5.3 x 5.2 mm Tortuosity-mild Calcification-mild Right Common Femoral Artery - Minimal Diameter-5.8 x 5.7 mm Tortuosity-mild Calcification-mild LEFT PELVIS: Left  Common Iliac Artery - Minimal Diameter-8.8 x 7.3 mm Tortuosity-mild Calcification-mild Left External Iliac Artery - Minimal Diameter-5.3 x 4.5 mm Tortuosity-mild Calcification-mild Left Common Femoral Artery - Minimal Diameter-5.8 x 5.7 mm Tortuosity-mild Calcification-mild Review of the MIP images confirms the above findings. IMPRESSION: 1. Vascular findings and measurements pertinent to potential TAVR procedure, as detailed above. 2. Severe thickening and calcification of the aortic valve, compatible with reported clinical history of severe aortic stenosis. 3. Diffuse dilatation of the main pancreatic duct with hypovascular areas throughout the body and tail of the pancreas. This is poorly evaluated on today's arterial phase examination, but favored to represent diffuse side branch ectasia along with multiple cystic lesions throughout the body and tail of the pancreas, however underlying hypovascular mass or masses in these regions is not excluded. Follow-up nonemergent outpatient abdominal MRI with and without IV gadolinium with MRCP is recommended in the near future to better evaluate these findings and exclude underlying neoplasm. 4. Small pulmonary nodules measuring 4 mm or less in size, nonspecific, but statistically likely benign. No follow-up needed if patient is low-risk (and has no known or suspected primary neoplasm). Non-contrast chest CT can be considered in 12 months if patient is high-risk. This recommendation follows the consensus statement: Guidelines for Management of Incidental Pulmonary Nodules Detected on CT Images: From the Fleischner Society 2017; Radiology 2017; 284:228-243. 5. Multiple calcified left-sided pleural plaques, presumably sequela of prior left pleural infection or hemorrhage. 6. Additional incidental findings, as above. Electronically Signed   By: Vinnie Langton M.D.   On: 06/03/2022 14:25   CT ANGIO CHEST AORTA W/CM & OR WO/CM  Result Date: 06/03/2022 CLINICAL DATA:   72 year old female with history of severe aortic stenosis. Preprocedural study prior to potential transcatheter aortic valve replacement (TAVR) procedure. EXAM: CT ANGIOGRAPHY CHEST, ABDOMEN AND PELVIS TECHNIQUE: Multidetector CT imaging through the chest, abdomen and pelvis was performed using the standard protocol during bolus administration of intravenous contrast. Multiplanar reconstructed images and MIPs were obtained and reviewed to evaluate the vascular anatomy. RADIATION DOSE REDUCTION: This exam was performed according to the departmental dose-optimization program which includes automated exposure control, adjustment of the mA and/or kV according to patient size and/or use of iterative reconstruction technique. CONTRAST:  112m OMNIPAQUE IOHEXOL 350 MG/ML SOLN COMPARISON:  None Available. FINDINGS: CTA CHEST FINDINGS Cardiovascular: Heart size is normal. There is no significant pericardial fluid, thickening or pericardial calcification. Aortic atherosclerosis. No definite coronary artery calcifications. Thickening and calcification of the aortic valve. Mediastinum/Lymph Nodes: No pathologically enlarged mediastinal or hilar lymph nodes. Esophagus is unremarkable in appearance.  No axillary lymphadenopathy. Lungs/Pleura: Scattered areas of linear architectural distortion in the lungs bilaterally (left-greater-than-right), most compatible with areas of chronic post infectious or inflammatory scarring. Multiple calcified pleural plaques in the left hemithorax are noted. No definite calcified right-sided pleural plaques. No acute consolidative airspace disease. No pleural effusions. A few scattered small pulmonary nodules are noted, largest of which is in the periphery of the right upper lobe (axial image 36 of series 5) measuring 4 mm. No larger more suspicious appearing pulmonary nodules or masses are noted. Musculoskeletal/Soft Tissues: There are no aggressive appearing lytic or blastic lesions noted in the  visualized portions of the skeleton. CTA ABDOMEN AND PELVIS FINDINGS Hepatobiliary: 8 mm low-attenuation lesion in the central aspect of segment 4A of the liver, too small to definitively characterize, but statistically likely tiny cysts (no imaging follow-up recommended). No other suspicious hepatic lesions are noted. No intra or extrahepatic biliary ductal dilatation. Gallbladder is normal in appearance. Pancreas: There are multiple low-attenuation regions noted throughout the pancreas, most evident in the body and tail, likely to represent extensive side branch ectasia and/or small cystic lesions, poorly evaluated on today's CT examination. No discrete solid appearing mass is identified. Diffuse dilatation of the pancreatic duct measuring up to 6 mm in the neck of the pancreas is noted. No peripancreatic fluid collections or inflammatory changes are noted. Spleen: Unremarkable. Adrenals/Urinary Tract: Bilateral kidneys and bilateral adrenal glands are normal in appearance. No hydroureteronephrosis. Urinary bladder is normal in appearance. Stomach/Bowel: The appearance of the stomach is normal. No pathologic dilatation of small bowel or colon. Normal appendix. Vascular/Lymphatic: Atherosclerosis in the abdominal aorta and pelvic vasculature. No lymphadenopathy noted in the abdomen or pelvis. Reproductive: Uterus and ovaries are unremarkable in appearance. Other: No significant volume of ascites.  No pneumoperitoneum. Musculoskeletal: There are no aggressive appearing lytic or blastic lesions noted in the visualized portions of the skeleton. VASCULAR MEASUREMENTS PERTINENT TO TAVR: AORTA: Minimal Aortic Diameter-13 x 13 mm Severity of Aortic Calcification-mild-to-moderate RIGHT PELVIS: Right Common Iliac Artery - Minimal Diameter-7.1 x 8.6 mm Tortuosity-mild Calcification-mild Right External Iliac Artery - Minimal Diameter-5.3 x 5.2 mm Tortuosity-mild Calcification-mild Right Common Femoral Artery - Minimal  Diameter-5.8 x 5.7 mm Tortuosity-mild Calcification-mild LEFT PELVIS: Left Common Iliac Artery - Minimal Diameter-8.8 x 7.3 mm Tortuosity-mild Calcification-mild Left External Iliac Artery - Minimal Diameter-5.3 x 4.5 mm Tortuosity-mild Calcification-mild Left Common Femoral Artery - Minimal Diameter-5.8 x 5.7 mm Tortuosity-mild Calcification-mild Review of the MIP images confirms the above findings. IMPRESSION: 1. Vascular findings and measurements pertinent to potential TAVR procedure, as detailed above. 2. Severe thickening and calcification of the aortic valve, compatible with reported clinical history of severe aortic stenosis. 3. Diffuse dilatation of the main pancreatic duct with hypovascular areas throughout the body and tail of the pancreas. This is poorly evaluated on today's arterial phase examination, but favored to represent diffuse side branch ectasia along with multiple cystic lesions throughout the body and tail of the pancreas, however underlying hypovascular mass or masses in these regions is not excluded. Follow-up nonemergent outpatient abdominal MRI with and without IV gadolinium with MRCP is recommended in the near future to better evaluate these findings and exclude underlying neoplasm. 4. Small pulmonary nodules measuring 4 mm or less in size, nonspecific, but statistically likely benign. No follow-up needed if patient is low-risk (and has no known or suspected primary neoplasm). Non-contrast chest CT can be considered in 12 months if patient is high-risk. This recommendation follows the consensus statement: Guidelines for Management of  Incidental Pulmonary Nodules Detected on CT Images: From the Fleischner Society 2017; Radiology 2017; 284:228-243. 5. Multiple calcified left-sided pleural plaques, presumably sequela of prior left pleural infection or hemorrhage. 6. Additional incidental findings, as above. Electronically Signed   By: Vinnie Langton M.D.   On: 06/03/2022 14:25   CT  CORONARY MORPH W/CTA COR W/SCORE W/CA W/CM &/OR WO/CM  Result Date: 06/03/2022 EXAM: OVER-READ INTERPRETATION  CT CHEST The following report is a limited chest CT over-read performed by radiologist Dr. Vinnie Langton of [facility], [place] on 06/03/2022. This over-read does not include interpretation of cardiac or coronary anatomy or pathology. The coronary calcium score and cardiac CTA interpretation by the cardiologist is attached. COMPARISON:  Chest CT 01/05/2018. FINDINGS: Extracardiac findings will be described separately under dictation for contemporaneously obtained CTA chest, abdomen and pelvis. IMPRESSION: Please see separate dictation for contemporaneously obtained CTA chest, abdomen and pelvis dated 06/03/2022 for full description of relevant extracardiac findings. Electronically Signed   By: Vinnie Langton M.D.   On: 06/03/2022 13:55   ECHOCARDIOGRAM COMPLETE  Result Date: 06/02/2022    ECHOCARDIOGRAM REPORT   Patient Name:   KRYSTAN STEARNES Date of Exam: 06/02/2022 Medical Rec #:  [id number]        Height:       60.0 in Accession #:    [id number]       Weight:       169.8 lb Date of Birth:  Aug 04, 1950       BSA:          1.741 m Patient Age:    72 years         BP:           117/63 mmHg Patient Gender: F                HR:           59 bpm. Exam Location:  Inpatient Procedure: 2D Echo, Cardiac Doppler and Color Doppler Indications:    I35.0 Nonrheumatic aortic (valve) stenosis  History:        Patient has prior history of Echocardiogram examinations, most                 recent 12/15/2021. COPD, Aortic Valve Disease,                 Signs/Symptoms:Murmur; Risk Factors:Current Smoker.  Sonographer:    Wilkie Aye RVT RCS Referring Phys: [id number] [facility]  Sonographer Comments: Image acquisition challenging due to respiratory motion. IMPRESSIONS  1. Left ventricular ejection fraction, by estimation, is 65 to 70%. The left ventricle has normal function. The left ventricle has no regional wall  motion abnormalities. There is mild left ventricular hypertrophy. Left ventricular diastolic parameters are consistent with Grade I diastolic dysfunction (impaired relaxation).  2. Right ventricular systolic function is normal. The right ventricular size is normal. Tricuspid regurgitation signal is inadequate for assessing PA pressure.  3. Left atrial size was upper normal.  4. The mitral valve is grossly normal, mildly calcified. Trivial mitral valve regurgitation.  5. 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 There is moderate calcification of the aortic valve. Aortic valve regurgitation is mild to moderate. Moderate to severe aortic valve stenosis, upper end of scale. Aortic regurgitation PHT measures 748 msec. Aortic valve mean gradient measures 33.5 mmHg. Dimentionless index 0.34.  6. The inferior vena cava is normal in size with <50% respiratory variability, suggesting right atrial pressure of 8 mmHg. Comparison(s): Prior images reviewed side by  side. Aortic valve is functionally bicuspid with evidence of moderate to severe stenosis, mean AV gradient up to 34 mmHg and dimentionless index 0.34. FINDINGS  Left Ventricle: Left ventricular ejection fraction, by estimation, is 65 to 70%. The left ventricle has normal function. The left ventricle has no regional wall motion abnormalities. The left ventricular internal cavity size was normal in size. There is  mild left ventricular hypertrophy. Left ventricular diastolic parameters are consistent with Grade I diastolic dysfunction (impaired relaxation). Right Ventricle: The right ventricular size is normal. No increase in right ventricular wall thickness. Right ventricular systolic function is normal. Tricuspid regurgitation signal is inadequate for assessing PA pressure. Left Atrium: Left atrial size was upper normal. Right Atrium: Right atrial size was normal in size. Pericardium: There is no evidence of pericardial effusion. Mitral Valve: The mitral valve is  grossly normal. There is mild calcification of the mitral valve leaflet(s). Trivial mitral valve regurgitation. Tricuspid Valve: The tricuspid valve is grossly normal. Tricuspid valve regurgitation is trivial. Aortic Valve: The aortic valve is bicuspid. There is moderate calcification of the aortic valve. There is mild to moderate aortic valve annular calcification. Aortic valve regurgitation is mild to moderate. Aortic regurgitation PHT measures 748 msec. Moderate to severe aortic stenosis is present. Aortic valve mean gradient measures 33.5 mmHg. Aortic valve peak gradient measures 59.9 mmHg. Aortic valve area, by VTI measures 0.45 cm. Pulmonic Valve: The pulmonic valve was grossly normal. Pulmonic valve regurgitation is trivial. Aorta: The aortic root is normal in size and structure. Venous: The inferior vena cava is normal in size with less than 50% respiratory variability, suggesting right atrial pressure of 8 mmHg. IAS/Shunts: No atrial level shunt detected by color flow Doppler.  LEFT VENTRICLE PLAX 2D LVIDd:         4.10 cm   Diastology LVIDs:         2.70 cm   LV e' medial:    4.67 cm/s LV PW:         1.20 cm   LV E/e' medial:  13.9 LV IVS:        1.10 cm   LV e' lateral:   4.26 cm/s LVOT diam:     1.30 cm   LV E/e' lateral: 15.3 LV SV:         43 LV SV Index:   25 LVOT Area:     1.33 cm  RIGHT VENTRICLE            IVC RV Basal diam:  3.90 cm    IVC diam: 2.00 cm RV S prime:     5.86 cm/s TAPSE (M-mode): 2.1 cm LEFT ATRIUM             Index        RIGHT ATRIUM           Index LA diam:        3.00 cm 1.72 cm/m   RA Area:     12.70 cm LA Vol (A2C):   46.9 ml 26.94 ml/m  RA Volume:   29.20 ml  16.77 ml/m LA Vol (A4C):   52.6 ml 30.21 ml/m LA Biplane Vol: 51.6 ml 29.64 ml/m  AORTIC VALVE                     PULMONIC VALVE AV Area (Vmax):    0.43 cm      PV Vmax:       1.06 m/s AV Area (Vmean):   0.40  cm      PV Peak grad:  4.5 mmHg AV Area (VTI):     0.45 cm AV Vmax:           387.00 cm/s AV Vmean:           269.500 cm/s AV VTI:            0.966 m AV Peak Grad:      59.9 mmHg AV Mean Grad:      33.5 mmHg LVOT Vmax:         126.00 cm/s LVOT Vmean:        82.100 cm/s LVOT VTI:          0.327 m LVOT/AV VTI ratio: 0.34 AI PHT:            748 msec AR Vena Contracta: 0.60 cm  AORTA Ao Root diam: 2.40 cm Ao Arch diam: 2.4 cm MITRAL VALVE MV Area (PHT): 2.70 cm    SHUNTS MV Decel Time: 281 msec    Systemic VTI:  0.33 m MV E velocity: 65.10 cm/s  Systemic Diam: 1.30 cm MV A velocity: 87.40 cm/s MV E/A ratio:  0.74 Rozann Lesches MD Electronically signed by Rozann Lesches MD Signature Date/Time: 06/02/2022/7:25:55 PM    Final    CARDIAC CATHETERIZATION  Result Date: 06/02/2022 1.  Widely patent coronary arteries with mild nonobstructive plaquing in the mid LAD and otherwise normal coronary arteries 2.  Normal right heart hemodynamics with RA pressure mean of 2 mmHg, PA pressure 25/7 with a mean of 17 mmHg, and wedge pressure of 7 mmHg.  Preserved cardiac output at 4.8 L/min and cardiac index of 2.8 L/min/m. 3.  Severe aortic stenosis by echo assessment Recommendations: The patient appears to be very well compensated.  Continue evaluation for treatment of symptomatic aortic stenosis.   DG Chest Port 1 View  Result Date: 06/01/2022 CLINICAL DATA:  Chest pain and near-syncope EXAM: PORTABLE CHEST 1 VIEW COMPARISON:  Chest radiograph dated 06/23/2021, CT chest dated 01/05/2018 FINDINGS: Normal lung volumes. Bibasilar patchy opacities. Left lateral lung scarring. Unchanged irregular radiodensity in the lateral upper left lung in keeping with known calcified pleural plaque. No pleural effusion or pneumothorax. The heart size and mediastinal contours are within normal limits. Postsurgical changes of the right humerus. IMPRESSION: Bibasilar patchy opacities, likely atelectasis. Aspiration or pneumonia can be considered in the appropriate clinical setting. Electronically Signed   By: Darrin Nipper M.D.   On: 06/01/2022 09:44    DG Foot Complete Right  Result Date: 05/30/2022 CLINICAL DATA:  Pain EXAM: RIGHT FOOT COMPLETE - 3 VIEW COMPARISON:  09/23/2021 FINDINGS: There is no evidence of fracture or dislocation. There is no evidence of arthropathy or other focal bone abnormality. Soft tissues are unremarkable. Small posterior and plantar calcaneal spurs. Postop change with the ligament screw overlying the calcaneus. IMPRESSION: Calcaneal spurs.  No acute osseous abnormalities. Electronically Signed   By: Sammie Bench M.D.   On: 05/30/2022 21:08   Disposition   Pt is being discharged home today in good condition.  Follow-up Plans & Appointments    You have been referred to Dr Gilford Raid at [facility] for further TAVR evaluation.  [address], [place] [address], [place], [place] [address], [phone] ([place]) You are scheduled for surgical evaluation with Dr Cyndia Bent on Tuesday, June 09, 2022 at 3:00 PM. Please arrive at 2:45 PM for check in.  Discharge Instructions     Diet - low sodium heart healthy  Complete by: As directed    Discharge instructions   Complete by: As directed    You have been referred to Dr Gilford Raid at [facility] for further TAVR evaluation.  [address], [place], [place], [place] [address], [phone] ([place]) You are scheduled for surgical evaluation with Dr Cyndia Bent on Tuesday, June 09, 2022 at 3:00 PM. Please arrive at 2:45 PM for check in.    NO HEAVY LIFTING OR SEXUAL ACTIVITY X 7 DAYS. NO DRIVING X 2-3 DAYS. NO SOAKING BATHS, HOT TUBS, POOLS, ETC., X 7 DAYS.   If the dressing is still on your incision site when you go home, remove it on the third day after your surgery date. Remove dressing if it begins to fall off, or if it is dirty or damaged before the third day.   Complete by: As directed    Increase activity slowly   Complete by: As directed        Discharge Medications   Allergies as of 06/03/2022       Reactions    Trazodone And Nefazodone Shortness Of Breath   Azithromycin Rash        Medication List     STOP taking these medications    meloxicam 15 MG tablet Commonly known as: MOBIC       TAKE these medications    aspirin EC 81 MG tablet TAKE 1 TABLET BY MOUTH EVERY DAY   atorvastatin 10 MG tablet Commonly known as: LIPITOR TAKE 1 TABLET BY MOUTH EVERY DAY   baclofen 10 MG tablet Commonly known as: LIORESAL TAKE 1 TABLET BY MOUTH THREE TIMES A DAY   Breo Ellipta 100-25 MCG/ACT Aepb Generic drug: fluticasone furoate-vilanterol TAKE 1 PUFF BY MOUTH EVERY DAY What changed: See the new instructions.   CALCIUM 600 PO Take 600 mg by mouth at bedtime.   CENTRUM SILVER ADULT 50+ PO Take 1 tablet by mouth daily.   clonazePAM 0.5 MG tablet Commonly known as: KLONOPIN TAKE 1 TABLET BY MOUTH EVERYDAY AT BEDTIME What changed: See the new instructions.   ferrous sulfate 325 (65 FE) MG EC tablet Take 1 tablet (325 mg total) by mouth daily with breakfast.   Fish Oil 1000 MG Caps Take 1,000 mg by mouth daily.   fluticasone 50 MCG/ACT nasal spray Commonly known as: FLONASE INSTILL 1 SPRAY INTO BOTH NOSTRILS DAILY What changed:  how much to take how to take this when to take this additional instructions   Garlic 123XX123 MG Caps Take 1,000 mg by mouth daily.   levothyroxine 100 MCG tablet Commonly known as: SYNTHROID TAKE 1 TABLET BY MOUTH DAILY BEFORE BREAKFAST.   pregabalin 75 MG capsule Commonly known as: LYRICA Take 1 capsule (75 mg total) by mouth at bedtime as needed. What changed: when to take this   rOPINIRole 3 MG tablet Commonly known as: REQUIP TAKE 1 TABLET BY MOUTH AT BEDTIME. What changed:  how much to take how to take this when to take this additional instructions               Discharge Care Instructions  (From admission, onward)           Start     Ordered   06/03/22 0000  If the dressing is still on your incision site when you go  home, remove it on the third day after your surgery date. Remove dressing if it begins to fall off, or if it is dirty or damaged before  the third day.        06/03/22 1440               Outstanding Labs/Studies     Duration of Discharge Encounter   Greater than 30 minutes including physician time.  SignedLily Kocher, PA-C 06/03/2022, 2:40 PM

## 2022-06-03 NOTE — Progress Notes (Signed)
Rounding Note    Patient Name: Carolyn [name] Date of Encounter: 06/03/2022  [place] Cardiologist: Kirk Ruths, MD   Subjective   Patient resting comfortably in bed this morning. Denies shortness of breath, chest pain this morning. She has not had symptoms of pre-syncope since admission, attributes this to being less active.   Inpatient Medications    Scheduled Meds:  atorvastatin  10 mg Oral Daily   baclofen  10 mg Oral TID   fluticasone furoate-vilanterol  1 puff Inhalation Daily   heparin  5,000 Units Subcutaneous Q8H   levothyroxine  100 mcg Oral QAC breakfast   pregabalin  75 mg Oral Daily   rOPINIRole  3 mg Oral QHS   Continuous Infusions:  PRN Meds: acetaminophen, clonazePAM, fentaNYL (SUBLIMAZE) injection, ondansetron (ZOFRAN) IV   Vital Signs    Vitals:   06/02/22 1916 06/02/22 2205 06/03/22 0423 06/03/22 0717  BP: (!) 154/67  123/61   Pulse: (!) 59 65 64   Resp: 14  14   Temp: 98.8 F (37.1 C)  97.7 F (36.5 C)   TempSrc: Oral  Oral   SpO2: 97% 94% 95% 96%  Weight:      Height:        Intake/Output Summary (Last 24 hours) at 06/03/2022 0910 Last data filed at 06/02/2022 2020 Gross per 24 hour  Intake 31.45 ml  Output 300 ml  Net -268.55 ml      06/02/2022    3:26 AM 06/02/2022   12:42 AM 06/01/2022    9:16 AM  Last 3 Weights  Weight (lbs) 169 lb 12.8 oz 169 lb 12.8 oz 164 lb  Weight (kg) 77.021 kg 77.021 kg 74.39 kg      Telemetry    Sinus rhythm, borderline first degree AV block - Personally Reviewed  ECG    No new tracing - Personally Reviewed  Physical Exam   GEN: No acute distress.   Neck: No JVD Cardiac: RRR, harsh systolic crescendo murmur Respiratory: Clear to auscultation bilaterally. GI: Soft, nontender, non-distended  MS: No edema; No deformity. Neuro:  Nonfocal  Psych: Normal affect   Labs    High Sensitivity Troponin:   Recent Labs  Lab 06/01/22 0923 06/01/22 1219  TROPONINIHS 8 6      Chemistry Recent Labs  Lab 06/01/22 0923 06/01/22 1628 06/02/22 0208 06/02/22 1548 06/02/22 1555  NA 135  --  132* 144  141 139  K 4.1  --  3.7 3.1*  3.5 3.7  CL 102  --  98  --   --   CO2 25  --  23  --   --   GLUCOSE 98  --  101*  --   --   BUN 17  --  19  --   --   CREATININE 0.90 0.81 0.89  --   --   CALCIUM 9.2  --  8.3*  --   --   PROT 6.3*  --   --   --   --   ALBUMIN 3.7  --   --   --   --   AST 29  --   --   --   --   ALT 26  --   --   --   --   ALKPHOS 70  --   --   --   --   BILITOT 0.7  --   --   --   --   GFRNONAA >60 >60 >  60  --   --   ANIONGAP 8  --  11  --   --     Lipids  Recent Labs  Lab 06/02/22 0208  CHOL 135  TRIG 67  HDL 73  LDLCALC 49  CHOLHDL 1.8    Hematology Recent Labs  Lab 06/01/22 0923 06/01/22 1628 06/02/22 0208 06/02/22 1548 06/02/22 1555  WBC 9.0 7.8 6.8  --   --   RBC 5.05 4.78 4.54  --   --   HGB 15.1* 14.3 13.6 12.6  13.6 13.6  HCT 45.2 42.2 39.4 37.0  40.0 40.0  MCV 89.5 88.3 86.8  --   --   MCH 29.9 29.9 30.0  --   --   MCHC 33.4 33.9 34.5  --   --   RDW 14.0 13.9 13.7  --   --   PLT 180 173 173  --   --    Thyroid  Recent Labs  Lab 06/02/22 0208  TSH 7.239*  FREET4 1.15*    BNP Recent Labs  Lab 06/01/22 1628  BNP 212.5*    DDimer No results for input(s): "DDIMER" in the last 168 hours.   Radiology    ECHOCARDIOGRAM COMPLETE  Result Date: 06/02/2022    ECHOCARDIOGRAM REPORT   Patient Name:   GREER AUGUSTA Date of Exam: 06/02/2022 Medical Rec #:  [id number]        Height:       60.0 in Accession #:    [id number]       Weight:       169.8 lb Date of Birth:  10/05/1950       BSA:          1.741 m Patient Age:    72 years         BP:           117/63 mmHg Patient Gender: F                HR:           59 bpm. Exam Location:  Inpatient Procedure: 2D Echo, Cardiac Doppler and Color Doppler Indications:    I35.0 Nonrheumatic aortic (valve) stenosis  History:        Patient has prior history of Echocardiogram  examinations, most                 recent 12/15/2021. COPD, Aortic Valve Disease,                 Signs/Symptoms:Murmur; Risk Factors:Current Smoker.  Sonographer:    Wilkie Aye RVT RCS Referring Phys: [id number] [facility]  Sonographer Comments: Image acquisition challenging due to respiratory motion. IMPRESSIONS  1. Left ventricular ejection fraction, by estimation, is 65 to 70%. The left ventricle has normal function. The left ventricle has no regional wall motion abnormalities. There is mild left ventricular hypertrophy. Left ventricular diastolic parameters are consistent with Grade I diastolic dysfunction (impaired relaxation).  2. Right ventricular systolic function is normal. The right ventricular size is normal. Tricuspid regurgitation signal is inadequate for assessing PA pressure.  3. Left atrial size was upper normal.  4. The mitral valve is grossly normal, mildly calcified. Trivial mitral valve regurgitation.  5. 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 There is moderate calcification of the aortic valve. Aortic valve regurgitation is mild to moderate. Moderate to severe aortic valve stenosis, upper end of scale. Aortic regurgitation PHT measures 748 msec. Aortic valve mean gradient measures 33.5 mmHg. Dimentionless index  0.34.  6. The inferior vena cava is normal in size with <50% respiratory variability, suggesting right atrial pressure of 8 mmHg. Comparison(s): Prior images reviewed side by side. Aortic valve is functionally bicuspid with evidence of moderate to severe stenosis, mean AV gradient up to 34 mmHg and dimentionless index 0.34. FINDINGS  Left Ventricle: Left ventricular ejection fraction, by estimation, is 65 to 70%. The left ventricle has normal function. The left ventricle has no regional wall motion abnormalities. The left ventricular internal cavity size was normal in size. There is  mild left ventricular hypertrophy. Left ventricular diastolic parameters are consistent with Grade I  diastolic dysfunction (impaired relaxation). Right Ventricle: The right ventricular size is normal. No increase in right ventricular wall thickness. Right ventricular systolic function is normal. Tricuspid regurgitation signal is inadequate for assessing PA pressure. Left Atrium: Left atrial size was upper normal. Right Atrium: Right atrial size was normal in size. Pericardium: There is no evidence of pericardial effusion. Mitral Valve: The mitral valve is grossly normal. There is mild calcification of the mitral valve leaflet(s). Trivial mitral valve regurgitation. Tricuspid Valve: The tricuspid valve is grossly normal. Tricuspid valve regurgitation is trivial. Aortic Valve: The aortic valve is bicuspid. There is moderate calcification of the aortic valve. There is mild to moderate aortic valve annular calcification. Aortic valve regurgitation is mild to moderate. Aortic regurgitation PHT measures 748 msec. Moderate to severe aortic stenosis is present. Aortic valve mean gradient measures 33.5 mmHg. Aortic valve peak gradient measures 59.9 mmHg. Aortic valve area, by VTI measures 0.45 cm. Pulmonic Valve: The pulmonic valve was grossly normal. Pulmonic valve regurgitation is trivial. Aorta: The aortic root is normal in size and structure. Venous: The inferior vena cava is normal in size with less than 50% respiratory variability, suggesting right atrial pressure of 8 mmHg. IAS/Shunts: No atrial level shunt detected by color flow Doppler.  LEFT VENTRICLE PLAX 2D LVIDd:         4.10 cm   Diastology LVIDs:         2.70 cm   LV e' medial:    4.67 cm/s LV PW:         1.20 cm   LV E/e' medial:  13.9 LV IVS:        1.10 cm   LV e' lateral:   4.26 cm/s LVOT diam:     1.30 cm   LV E/e' lateral: 15.3 LV SV:         43 LV SV Index:   25 LVOT Area:     1.33 cm  RIGHT VENTRICLE            IVC RV Basal diam:  3.90 cm    IVC diam: 2.00 cm RV S prime:     5.86 cm/s TAPSE (M-mode): 2.1 cm LEFT ATRIUM             Index        RIGHT  ATRIUM           Index LA diam:        3.00 cm 1.72 cm/m   RA Area:     12.70 cm LA Vol (A2C):   46.9 ml 26.94 ml/m  RA Volume:   29.20 ml  16.77 ml/m LA Vol (A4C):   52.6 ml 30.21 ml/m LA Biplane Vol: 51.6 ml 29.64 ml/m  AORTIC VALVE                     PULMONIC VALVE  AV Area (Vmax):    0.43 cm      PV Vmax:       1.06 m/s AV Area (Vmean):   0.40 cm      PV Peak grad:  4.5 mmHg AV Area (VTI):     0.45 cm AV Vmax:           387.00 cm/s AV Vmean:          269.500 cm/s AV VTI:            0.966 m AV Peak Grad:      59.9 mmHg AV Mean Grad:      33.5 mmHg LVOT Vmax:         126.00 cm/s LVOT Vmean:        82.100 cm/s LVOT VTI:          0.327 m LVOT/AV VTI ratio: 0.34 AI PHT:            748 msec AR Vena Contracta: 0.60 cm  AORTA Ao Root diam: 2.40 cm Ao Arch diam: 2.4 cm MITRAL VALVE MV Area (PHT): 2.70 cm    SHUNTS MV Decel Time: 281 msec    Systemic VTI:  0.33 m MV E velocity: 65.10 cm/s  Systemic Diam: 1.30 cm MV A velocity: 87.40 cm/s MV E/A ratio:  0.74 Rozann Lesches MD Electronically signed by Rozann Lesches MD Signature Date/Time: 06/02/2022/7:25:55 PM    Final    CARDIAC CATHETERIZATION  Result Date: 06/02/2022 1.  Widely patent coronary arteries with mild nonobstructive plaquing in the mid LAD and otherwise normal coronary arteries 2.  Normal right heart hemodynamics with RA pressure mean of 2 mmHg, PA pressure 25/7 with a mean of 17 mmHg, and wedge pressure of 7 mmHg.  Preserved cardiac output at 4.8 L/min and cardiac index of 2.8 L/min/m. 3.  Severe aortic stenosis by echo assessment Recommendations: The patient appears to be very well compensated.  Continue evaluation for treatment of symptomatic aortic stenosis.   DG Chest Port 1 View  Result Date: 06/01/2022 CLINICAL DATA:  Chest pain and near-syncope EXAM: PORTABLE CHEST 1 VIEW COMPARISON:  Chest radiograph dated 06/23/2021, CT chest dated 01/05/2018 FINDINGS: Normal lung volumes. Bibasilar patchy opacities. Left lateral lung scarring.  Unchanged irregular radiodensity in the lateral upper left lung in keeping with known calcified pleural plaque. No pleural effusion or pneumothorax. The heart size and mediastinal contours are within normal limits. Postsurgical changes of the right humerus. IMPRESSION: Bibasilar patchy opacities, likely atelectasis. Aspiration or pneumonia can be considered in the appropriate clinical setting. Electronically Signed   By: Darrin Nipper M.D.   On: 06/01/2022 09:44    Cardiac Studies   06/02/22 LHC/RHC  1.  Widely patent coronary arteries with mild nonobstructive plaquing in the mid LAD and otherwise normal coronary arteries 2.  Normal right heart hemodynamics with RA pressure mean of 2 mmHg, PA pressure 25/7 with a mean of 17 mmHg, and wedge pressure of 7 mmHg.  Preserved cardiac output at 4.8 L/min and cardiac index of 2.8 L/min/m. 3.  Severe aortic stenosis by echo assessment   Recommendations: The patient appears to be very well compensated.  Continue evaluation for treatment of symptomatic aortic stenosis.  06/02/22 TTE  IMPRESSIONS     1. Left ventricular ejection fraction, by estimation, is 65 to 70%. The  left ventricle has normal function. The left ventricle has no regional  wall motion abnormalities. There is mild left ventricular hypertrophy.  Left ventricular diastolic parameters  are consistent with Grade I diastolic dysfunction (impaired relaxation).   2. Right ventricular systolic function is normal. The right ventricular  size is normal. Tricuspid regurgitation signal is inadequate for assessing  PA pressure.   3. Left atrial size was upper normal.   4. The mitral valve is grossly normal, mildly calcified. Trivial mitral  valve regurgitation.   5. 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 There is moderate  calcification of the aortic valve. Aortic valve regurgitation is mild to  moderate. Moderate to severe aortic valve stenosis, upper end of scale.  Aortic regurgitation  PHT measures 748 msec.  Aortic valve mean gradient measures 33.5 mmHg. Dimentionless index 0.34.   6. The inferior vena cava is normal in size with <50% respiratory  variability, suggesting right atrial pressure of 8 mmHg.   Comparison(s): Prior images reviewed side by side. Aortic valve is  functionally bicuspid with evidence of moderate to severe stenosis, mean  AV gradient up to 34 mmHg and dimentionless index 0.34.   FINDINGS   Left Ventricle: Left ventricular ejection fraction, by estimation, is 65  to 70%. The left ventricle has normal function. The left ventricle has no  regional wall motion abnormalities. The left ventricular internal cavity  size was normal in size. There is   mild left ventricular hypertrophy. Left ventricular diastolic parameters  are consistent with Grade I diastolic dysfunction (impaired relaxation).   Right Ventricle: The right ventricular size is normal. No increase in  right ventricular wall thickness. Right ventricular systolic function is  normal. Tricuspid regurgitation signal is inadequate for assessing PA  pressure.   Left Atrium: Left atrial size was upper normal.   Right Atrium: Right atrial size was normal in size.   Pericardium: There is no evidence of pericardial effusion.   Mitral Valve: The mitral valve is grossly normal. There is mild  calcification of the mitral valve leaflet(s). Trivial mitral valve  regurgitation.   Tricuspid Valve: The tricuspid valve is grossly normal. Tricuspid valve  regurgitation is trivial.   Aortic Valve: The aortic valve is bicuspid. There is moderate  calcification of the aortic valve. There is mild to moderate aortic valve  annular calcification. Aortic valve regurgitation is mild to moderate.  Aortic regurgitation PHT measures 748 msec.  Moderate to severe aortic stenosis is present. Aortic valve mean gradient  measures 33.5 mmHg. Aortic valve peak gradient measures 59.9 mmHg. Aortic  valve area, by  VTI measures 0.45 cm.   Pulmonic Valve: The pulmonic valve was grossly normal. Pulmonic valve  regurgitation is trivial.   Aorta: The aortic root is normal in size and structure.   Venous: The inferior vena cava is normal in size with less than 50%  respiratory variability, suggesting right atrial pressure of 8 mmHg.   IAS/Shunts: No atrial level shunt detected by color flow Doppler.   Patient Profile     72 y.o. female  hx of COPD,  hypothyroidism, anxiety, severe aortic stenosis, HLD, tobacco use, RLS admitted 06/01/22 with chest pain and presyncope.     Assessment & Plan    Severe aortic stenosis Acute HFpEF Exertional chest pain  Patient admitted with symptoms of progressive exertional dyspnea, pre-syncope, chest pain, concerning for progressing aortic stenosis. BNP found elevated at 212.5. Troponin 6->8. TTE yesterday showed LVEF 65-70%, moderate to severe AS with mean gradient 33.1mHg, peak gradient 59.932mg. VTI 0.45. LHC/RHC with mild non-obstructive plaque in LAD, normal right heart hemodynamics with RA pressure mean of 2 mmHg, PA pressure 25/7 with  a mean of 17 mmHg, and wedge pressure of 7 mmHg.  Preserved cardiac output at 4.8 L/min and cardiac index of 2.8 L/min/m.   Patient appears euvolemic today, no plans for further diuresis She will be seen by structural heart team today regarding possible TAVR. Would need cardiac CT.  Hyperlipidemia  Continue Atorvastatin '10mg'$   Lab Results  Component Value Date   LDLCALC 49 06/02/2022   Hypothyroidism  TSH elevated to 7.239, free T4 1.15. T3 is in process. Will continue home Levothyroxine dosing for now.  COPD  Continue home inhaler regimen. Complete cessation encouraged.   Anxiety Restless leg syndrome  Continue home Clonazepam and Baclofen  For questions or updates, please contact [place] Please consult [email or web address] for contact info under        Signed, Lily Kocher, PA-C  06/03/2022, 9:10  AM

## 2022-06-03 NOTE — TOC Transition Note (Signed)
Transition of Care [facility]) - CM/SW Discharge Note   Patient Details  Name: Carolyn [name] MRN: [id number] Date of Birth: 11-04-1950  Transition of Care [facility]) CM/SW Contact:  Tom-Johnson, Renea Ee, RN Phone Number: 06/03/2022, 3:12 PM   Clinical Narrative:     Patient is scheduled for discharge today. Outpatient f/u and discharge instructions on AVS. No TOC needs or recommendations noted. Family to transport at discharge. No further TOC needs noted.         Final next level of care: Home/Self Care Barriers to Discharge: Barriers Resolved   Patient Goals and CMS Choice CMS Medicare.gov Compare Post Acute Care list provided to:: Patient Choice offered to / list presented to : NA  Discharge Placement                  Patient to be transferred to facility by: Family      Discharge Plan and Services Additional resources added to the After Visit Summary for                  DME Arranged: N/A DME Agency: NA       HH Arranged: NA [place] Agency: NA        Social Determinants of Health (SDOH) Interventions SDOH Screenings   Food Insecurity: No Food Insecurity (06/01/2022)  Housing: Low Risk  (06/01/2022)  Transportation Needs: No Transportation Needs (06/01/2022)  Utilities: Not At Risk (06/01/2022)  Depression (PHQ2-9): Low Risk  (05/18/2022)  Financial Resource Strain: Low Risk  (05/12/2018)  Physical Activity: Inactive (05/12/2018)  Social Connections: Somewhat Isolated (05/12/2018)  Stress: No Stress Concern Present (05/12/2018)  Tobacco Use: High Risk (06/03/2022)     Readmission Risk Interventions     No data to display

## 2022-06-04 ENCOUNTER — Encounter: Payer: Self-pay | Admitting: *Deleted

## 2022-06-04 ENCOUNTER — Telehealth: Payer: Self-pay | Admitting: *Deleted

## 2022-06-04 LAB — T3: T3, Total: 111 ng/dL (ref 71–180)

## 2022-06-04 NOTE — Transitions of Care (Post Inpatient/ED Visit) (Signed)
06/04/2022  Name: Carolyn [name] MRN: [id number] DOB: 1950/07/11  Today's TOC FU Call Status: Today's TOC FU Call Status:: Successful TOC FU Call Competed (verified HIPAA identifiers x 2) TOC FU Call Complete Date: 06/04/22  Transition Care Management Follow-up Telephone Call Date of Discharge: 06/03/22 Discharge Facility: [facility]) Type of Discharge: Inpatient Admission Primary Inpatient Discharge Diagnosis:: severe AS- (L)/ (R) heart cath How have you been since you were released from the hospital?: Same ("A little slow moving, but overall okay; I will find out more after I see the heart surgeon next week") Any questions or concerns?: No  Items Reviewed: Did you receive and understand the discharge instructions provided?: Yes (reviewed thoroughly with patient today who verbalizes very good understanding of same) Medications obtained and verified?: Yes (Medications Reviewed) (self manages medications; verbalizes good understanding of medication changes post- recent procedure; denies medication concerns/ questions; declines full medication review stating just sat down to eat) Any new allergies since your discharge?: No Dietary orders reviewed?: Yes Type of Diet Ordered:: "heart healthy, low salt" Do you have support at home?: Yes People in Home: alone Name of Support/Comfort Primary Source: lives alone and is independent in self-care activities; local family assists as/ if indicated  Home Care and Equipment/Supplies: [place] Ordered?: No Any new equipment or medical supplies ordered?: No  Functional Questionnaire: Do you need assistance with bathing/showering or dressing?: No Do you need assistance with meal preparation?: No Do you need assistance with eating?: No Do you have difficulty maintaining continence: No Do you need assistance with getting out of bed/getting out of a chair/moving?: No Do you have difficulty managing or taking your medications?:  No  Folllow up appointments reviewed: PCP Follow-up appointment confirmed?: No (verified not indicated per hospital discharge provider notes) MD Provider Line Number:[phone] Given: No Specialist Hospital Follow-up appointment confirmed?: Yes Date of Specialist follow-up appointment?: 06/09/22 Follow-Up Specialty Provider:: cardiothoracic surgeon, Dr. Cyndia Bent Do you need transportation to your follow-up appointment?: No Do you understand care options if your condition(s) worsen?: Yes-patient verbalized understanding  SDOH Interventions Today    Flowsheet Row Most Recent Value  SDOH Interventions   Food Insecurity Interventions Intervention Not Indicated  Transportation Interventions Intervention Not Indicated  [normally drives self,  driving restriction x 2 days post-recent cardiac procedure,  sister assisting as needed]      TOC Interventions Today    Flowsheet Row Most Recent Value  TOC Interventions   TOC Interventions Discussed/Reviewed TOC Interventions Discussed, Post op wound/incision care  [provided my direct phone number should questions/ concerns/ needs arise post-TOC call today]      Interventions Today    Flowsheet Row Most Recent Value  Chronic Disease   Chronic disease during today's visit Other  [severe AS]  General Interventions   General Interventions Discussed/Reviewed General Interventions Discussed, Doctor Visits  Doctor Visits Discussed/Reviewed Specialist, Doctor Visits Discussed, PCP  PCP/Specialist Visits Compliance with follow-up visit  Education Interventions   Education Provided Provided Education  Provided Verbal Education On When to see the doctor, Other  [process to take to follow up on recently ordered home sleep study- patient has my direct contact number should she need assistance after she has office visit with cardiothoracic surgeon on 06/09/22]  Nutrition Interventions   Nutrition Discussed/Reviewed Nutrition Discussed, Decreasing salt   Pharmacy Interventions   Pharmacy Dicussed/Reviewed Pharmacy Topics Discussed      Oneta Rack, RN, BSN, CCRN Alumnus RN CM Care Coordination/ Transition of Care- [place]  Management [phone]: direct office

## 2022-06-05 ENCOUNTER — Ambulatory Visit: Payer: No Typology Code available for payment source | Admitting: Nurse Practitioner

## 2022-06-09 ENCOUNTER — Encounter: Payer: Self-pay | Admitting: Surgery

## 2022-06-09 ENCOUNTER — Other Ambulatory Visit: Payer: Self-pay | Admitting: *Deleted

## 2022-06-09 ENCOUNTER — Institutional Professional Consult (permissible substitution): Payer: No Typology Code available for payment source | Admitting: Surgery

## 2022-06-09 VITALS — BP 126/70 | HR 85 | Resp 20 | Ht 60.0 in | Wt 168.0 lb

## 2022-06-09 DIAGNOSIS — I350 Nonrheumatic aortic (valve) stenosis: Secondary | ICD-10-CM

## 2022-06-09 NOTE — Progress Notes (Signed)
Patient ID: Carolyn [name], female   DOB: 1951/02/10, 72 y.o.   MRN: [id number]  [facility]VE CLINIC       [address]       [place] [address]             [phone]          CARDIOTHORACIC SURGERY CONSULTATION REPORT  PCP is [name], DO Referring Provider is Lenna Sciara, MD Primary Cardiologist is Kirk Ruths, MD  Reason for consultation: Severe aortic stenosis and mild to moderate aortic insufficiency  HPI:  The patient is a 72 year old woman with a history of hyperlipidemia, smoking and COPD, hypothyroidism, and moderate to severe aortic stenosis who reports progressive exertional fatigue and shortness of breath over the past year with recent episodes of substernal chest discomfort at rest and with exertion as well as episodes of dizziness.  She was recently seen in the emergency department on 06/01/2022 due to chest discomfort and dizziness.  She says she felt like she was going to pass out.  High-sensitivity troponin was negative.  There were no significant ECG changes.  A repeat echocardiogram showed a functionally bicuspid aortic valve with markedly thickened leaflets.  There is mild calcification.  The mean gradient was measured at 33.5 mmHg with a dimensionless index of 0.34 with mild to moderate aortic insufficiency.  Left ventricular ejection fraction was 65 to 70% with mild LVH and grade 1 diastolic dysfunction.  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  Right heart pressures were normal with a cardiac index of 2.8.  She was seen by Dr. Ali Lowe for consideration of TAVR.  A gated cardiac CTA showed a trileaflet aortic valve with severely thickened and mild to moderately calcified aortic valve cusps.  The aortic valve calcium score was only 319.  Since discharge she reports that she has continued to have exertional  fatigue and shortness of breath.  She has had some twinges of chest discomfort with and without exertion.  She denies any orthopnea or PND.  She has continued to smoke about half pack of cigarettes per day but is smoked more in the past.  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   left nare   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   left upper shin   SCC (squamous cell carcinoma) in situ 03/19/2016   right pinky   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   right thigh, left upper shin   SCC (squamous cell carcinoma) well diff 12/13/2008   left hand   SCC (squamous cell carcinoma) well diff x2 12/25/2015   left and right forearm   Thyroid disease     Past Surgical History:  Procedure Laterality Date  CERVICAL CONIZATION W/BX     EYE SURGERY Bilateral    Cataracts   MELANOMA EXCISION  08/2014   back   RIGHT HEART CATH AND CORONARY ANGIOGRAPHY N/A 06/02/2022   Procedure: RIGHT HEART CATH AND CORONARY ANGIOGRAPHY;  Surgeon: Sherren Mocha, MD;  Location: [facility];  Service: Cardiovascular;  Laterality: N/A;   SHOULDER SURGERY Right     Family History  Problem Relation Age of Onset   Heart disease Mother    Aneurysm Mother    Heart disease Father    Kidney failure Father    Cancer Sister    Cancer Brother    Cirrhosis Daughter    Cancer Sister        Bronchial     Social History   Socioeconomic History   Marital status: Divorced    Spouse name: Not on file   Number of children: 2   Years of education: Not on file   Highest education level: GED or equivalent  Occupational History   Occupation: disabled  Tobacco Use   Smoking status: Every Day    Packs/day: 0.50    Types: Cigarettes    Start date: 04/06/1968    Passive exposure: Never   Smokeless tobacco: Never  Vaping Use   Vaping Use: Never used  Substance and Sexual Activity   Alcohol use: Never   Drug use: No   Sexual activity: Not on file  Other Topics Concern   Not on file  Social History Narrative   Lives alone   R handed   Caffeine: half a litter of soda in a day.    Social Determinants of Health   Financial Resource Strain: Low Risk  (05/12/2018)   Overall Financial Resource Strain (CARDIA)    Difficulty of Paying Living Expenses: Not hard at all  Food Insecurity: No Food Insecurity (06/04/2022)   Hunger Vital Sign    Worried About Running Out of Food in the Last Year: Never true    Ran Out of Food in the Last Year: Never true  Transportation Needs: No Transportation Needs (06/04/2022)   PRAPARE - Hydrologist (Medical): No    Lack of Transportation (Non-Medical): No  Physical Activity: Inactive (05/12/2018)   Exercise Vital Sign    Days of Exercise per Week: 0 days    Minutes of Exercise per Session: 0 min  Stress: No Stress Concern Present (05/12/2018)   [place]    Feeling of Stress : Not at all  Social Connections: Somewhat Isolated (05/12/2018)   Social Connection and Isolation Panel [NHANES]    Frequency of Communication with Friends and Family: More than three times a week    Frequency of Social Gatherings with Friends and Family: More than three times a week    Attends Religious Services: More than 4 times per year    Active Member of [employer] or Organizations: No     Attends Archivist Meetings: Never    Marital Status: Divorced  Human resources officer Violence: Not At Risk (06/01/2022)   Humiliation, Afraid, Rape, and Kick questionnaire    Fear of Current or Ex-Partner: No    Emotionally Abused: No    Physically Abused: No    Sexually Abused: No    Prior to Admission medications   Medication Sig Start Date End Date Taking? Authorizing Provider  aspirin 81 MG EC tablet TAKE 1 TABLET BY MOUTH EVERY DAY Patient taking differently:  Take 81 mg by mouth daily. 11/11/18  Yes Terald Sleeper, PA-C  atorvastatin (LIPITOR) 10 MG tablet TAKE 1 TABLET BY MOUTH EVERY DAY 02/24/22  Yes [name], DO  baclofen (LIORESAL) 10 MG tablet TAKE 1 TABLET BY MOUTH THREE TIMES A DAY 02/24/22  Yes Matthews, Cody, DO  BREO ELLIPTA 100-25 MCG/ACT AEPB TAKE 1 PUFF BY MOUTH EVERY DAY Patient taking differently: Inhale 1 puff into the lungs daily. 02/24/22  Yes [name], DO  Calcium Carbonate (CALCIUM 600 PO) Take 600 mg by mouth at bedtime.   Yes [provider]  clonazePAM (KLONOPIN) 0.5 MG tablet TAKE 1 TABLET BY MOUTH EVERYDAY AT BEDTIME Patient taking differently: Take 0.5 mg by mouth at bedtime. 05/25/22  Yes [name], DO  ferrous sulfate 325 (65 FE) MG EC tablet Take 1 tablet (325 mg total) by mouth daily with breakfast. 03/26/22  Yes Chima, Anderson [place], MD  fluticasone (FLONASE) 50 MCG/ACT nasal spray INSTILL 1 SPRAY INTO BOTH NOSTRILS DAILY Patient taking differently: Place 1 spray into both nostrils daily. 04/10/22  Yes [name], DO  Garlic 123XX123 MG CAPS Take 1,000 mg by mouth daily.   Yes [provider]  levothyroxine (SYNTHROID) 100 MCG tablet TAKE 1 TABLET BY MOUTH DAILY BEFORE BREAKFAST. 10/27/21  Yes [name], DO  Multiple Vitamins-Minerals (CENTRUM SILVER ADULT 50+ PO) Take 1 tablet by mouth daily.   Yes [provider]  Omega-3 Fatty Acids (FISH OIL) 1000 MG CAPS Take 1,000 mg by mouth daily.   Yes [provider]  pregabalin (LYRICA) 75 MG capsule Take 1 capsule (75 mg total) by mouth at bedtime as needed. Patient taking differently: Take 75 mg by mouth at bedtime. 03/25/22  Yes Genia Harold, MD  rOPINIRole (REQUIP) 3 MG tablet TAKE 1 TABLET BY MOUTH AT BEDTIME. Patient taking differently: Take 3 mg by mouth at bedtime. 04/10/22  Yes [name], DO    Current Outpatient Medications  Medication Sig Dispense Refill   aspirin 81 MG EC tablet TAKE 1 TABLET BY MOUTH EVERY DAY (Patient taking differently: Take 81 mg by mouth daily.) 90 tablet 3   atorvastatin (LIPITOR) 10 MG tablet TAKE 1 TABLET BY MOUTH EVERY DAY 90 tablet 3   baclofen (LIORESAL) 10 MG tablet TAKE 1 TABLET BY MOUTH THREE TIMES A DAY 270 tablet 1   BREO ELLIPTA 100-25 MCG/ACT AEPB TAKE 1 PUFF BY MOUTH EVERY DAY (Patient taking differently: Inhale 1 puff into the lungs daily.) 60 each 11   Calcium Carbonate (CALCIUM 600 PO) Take 600 mg by mouth at bedtime.     clonazePAM (KLONOPIN) 0.5 MG tablet TAKE 1 TABLET BY MOUTH EVERYDAY AT BEDTIME (Patient taking differently: Take 0.5 mg by mouth at bedtime.) 30 tablet 0   ferrous sulfate 325 (65 FE) MG EC tablet Take 1 tablet (325 mg total) by mouth daily with breakfast. 30 tablet 5   fluticasone (FLONASE) 50 MCG/ACT nasal spray INSTILL 1 SPRAY INTO BOTH NOSTRILS DAILY (Patient taking differently: Place 1 spray into both nostrils daily.) 48 mL 1   Garlic [id number] MG CAPS Take 1,000 mg by mouth daily.     levothyroxine (SYNTHROID) 100 MCG tablet TAKE 1 TABLET BY MOUTH DAILY BEFORE BREAKFAST. 90 tablet 3   Multiple Vitamins-Minerals (CENTRUM SILVER ADULT 50+ PO) Take 1 tablet by mouth daily.     Omega-3 Fatty Acids (FISH OIL) 1000 MG CAPS Take 1,000 mg by mouth daily.     pregabalin (LYRICA) 75 MG capsule Take  1 capsule (75 mg total) by mouth at bedtime as needed. (Patient taking differently: Take 75 mg by mouth at bedtime.) 30 capsule 5   rOPINIRole (REQUIP) 3 MG tablet TAKE 1 TABLET BY  MOUTH AT BEDTIME. (Patient taking differently: Take 3 mg by mouth at bedtime.) 90 tablet 0   No current facility-administered medications for this visit.    Allergies  Allergen Reactions   Trazodone And Nefazodone Shortness Of Breath   Azithromycin Rash      Review of Systems:   General:  normal appetite, + decreased energy, no weight gain, no weight loss, no fever  Cardiac:  + chest pain with exertion, + chest pain at rest, +SOB with  exertion, no resting SOB, no PND, no orthopnea, no palpitations, no arrhythmia, no atrial fibrillation, no LE edema, + dizzy spells, no syncope  Respiratory:  + exertional shortness of breath, no home oxygen, no productive cough, + dry cough, no bronchitis, no wheezing, no hemoptysis, no asthma, no pain with inspiration or cough, no sleep apnea, no CPAP at night  GI:   no difficulty swallowing, no reflux, no frequent heartburn, no hiatal hernia, no abdominal pain, + constipation, no diarrhea, no hematochezia, no hematemesis, no melena  GU:   no dysuria,  + frequency, no urinary tract infection, no hematuria, no kidney stones, no kidney disease  Vascular:  no pain suggestive of claudication, no pain in feet, no leg cramps, no varicose veins, no DVT, no non-healing foot ulcer  Neuro:   no stroke, no TIA's, no seizures, no headaches, no temporary blindness one eye,  no slurred speech, no peripheral neuropathy, no chronic pain, no instability of gait, no memory/cognitive dysfunction  Musculoskeletal: + arthritis, no joint swelling, + myalgias, n difficulty walking, o mobility   Skin:   no rash, no itching, no skin infections, no pressure sores or ulcerations  Psych:   no anxiety, no depression, no nervousness, no unusual recent stress  Eyes:   no blurry vision, no floaters, no recent vision changes, + wears glasses   ENT:   no hearing loss, no loose or painful teeth, + dentures, last saw dentist 2023  Hematologic:  + easy bruising, no abnormal bleeding, no  clotting disorder, no frequent epistaxis  Endocrine:  no diabetes, does not check CBG's at home     Physical Exam:   BP 126/70   Pulse 85   Resp 20   Ht 5' (1.524 m)   Wt 168 lb (76.2 kg)   SpO2 92% Comment: RA  BMI 32.81 kg/m   General:  well-appearing  HEENT:  Unremarkable, NCAT, PERLA, EOMI  Neck:   no JVD, no bruits, no adenopathy   Chest:   clear to auscultation, symmetrical breath sounds, + wheezes, no rhonchi   CV:   RRR, 3/6 systolic murmur RSB, no diastolic murmur  Abdomen:  soft, non-tender, no masses   Extremities:  warm, well-perfused, pedal pulses palpable, lower extremity edema  Rectal/GU  Deferred  Neuro:   Grossly non-focal and symmetrical throughout  Skin:   Clean and dry, no rashes, no breakdown  Diagnostic Tests:  ECHOCARDIOGRAM REPORT       Patient Name:   RYVER DAR Date of Exam: 06/02/2022  Medical Rec #:  [id number]        Height:       60.0 in  Accession #:    [id number]       Weight:       169.8 lb  Date of Birth:  1950-07-20       BSA:          1.741 m  Patient Age:    54 years         BP:           117/63 mmHg  Patient Gender: F                HR:           59 bpm.  Exam Location:  Inpatient   Procedure: 2D Echo, Cardiac Doppler and Color Doppler   Indications:    I35.0 Nonrheumatic aortic (valve) stenosis    History:        Patient has prior history of Echocardiogram examinations,  most                 recent 12/15/2021. COPD, Aortic Valve Disease,                  Signs/Symptoms:Murmur; Risk Factors:Current Smoker.    Sonographer:    Wilkie Aye RVT RCS  Referring Phys: [id number] [facility]     Sonographer Comments: Image acquisition challenging due to respiratory  motion.  IMPRESSIONS     1. Left ventricular ejection fraction, by estimation, is 65 to 70%. The  left ventricle has normal function. The left ventricle has no regional  wall motion abnormalities. There is mild left ventricular hypertrophy.  Left ventricular  diastolic parameters  are consistent with Grade I diastolic dysfunction (impaired relaxation).   2. Right ventricular systolic function is normal. The right ventricular  size is normal. Tricuspid regurgitation signal is inadequate for assessing  PA pressure.   3. Left atrial size was upper normal.   4. The mitral valve is grossly normal, mildly calcified. Trivial mitral  valve regurgitation.   5. 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 There is moderate  calcification of the aortic valve. Aortic valve regurgitation is mild to  moderate. Moderate to severe aortic valve stenosis, upper end of scale.  Aortic regurgitation PHT measures 748 msec.  Aortic valve mean gradient measures 33.5 mmHg. Dimentionless index 0.34.   6. The inferior vena cava is normal in size with <50% respiratory  variability, suggesting right atrial pressure of 8 mmHg.   Comparison(s): Prior images reviewed side by side. Aortic valve is  functionally bicuspid with evidence of moderate to severe stenosis, mean  AV gradient up to 34 mmHg and dimentionless index 0.34.   FINDINGS   Left Ventricle: Left ventricular ejection fraction, by estimation, is 65  to 70%. The left ventricle has normal function. The left ventricle has no  regional wall motion abnormalities. The left ventricular internal cavity  size was normal in size. There is   mild left ventricular hypertrophy. Left ventricular diastolic parameters  are consistent with Grade I diastolic dysfunction (impaired relaxation).   Right Ventricle: The right ventricular size is normal. No increase in  right ventricular wall thickness. Right ventricular systolic function is  normal. Tricuspid regurgitation signal is inadequate for assessing PA  pressure.   Left Atrium: Left atrial size was upper normal.   Right Atrium: Right atrial size was normal in size.   Pericardium: There is no evidence of pericardial effusion.   Mitral Valve: The mitral valve is grossly  normal. There is mild  calcification of the mitral valve leaflet(s). Trivial mitral valve  regurgitation.   Tricuspid Valve: The tricuspid valve is grossly normal. Tricuspid valve  regurgitation is trivial.   Aortic Valve: The aortic valve  is bicuspid. There is moderate  calcification of the aortic valve. There is mild to moderate aortic valve  annular calcification. Aortic valve regurgitation is mild to moderate.  Aortic regurgitation PHT measures 748 msec.  Moderate to severe aortic stenosis is present. Aortic valve mean gradient  measures 33.5 mmHg. Aortic valve peak gradient measures 59.9 mmHg. Aortic  valve area, by VTI measures 0.45 cm.   Pulmonic Valve: The pulmonic valve was grossly normal. Pulmonic valve  regurgitation is trivial.   Aorta: The aortic root is normal in size and structure.   Venous: The inferior vena cava is normal in size with less than 50%  respiratory variability, suggesting right atrial pressure of 8 mmHg.   IAS/Shunts: No atrial level shunt detected by color flow Doppler.     LEFT VENTRICLE  PLAX 2D  LVIDd:         4.10 cm   Diastology  LVIDs:         2.70 cm   LV e' medial:    4.67 cm/s  LV PW:         1.20 cm   LV E/e' medial:  13.9  LV IVS:        1.10 cm   LV e' lateral:   4.26 cm/s  LVOT diam:     1.30 cm   LV E/e' lateral: 15.3  LV SV:         43  LV SV Index:   25  LVOT Area:     1.33 cm     RIGHT VENTRICLE            IVC  RV Basal diam:  3.90 cm    IVC diam: 2.00 cm  RV S prime:     5.86 cm/s  TAPSE (M-mode): 2.1 cm   LEFT ATRIUM             Index        RIGHT ATRIUM           Index  LA diam:        3.00 cm 1.72 cm/m   RA Area:     12.70 cm  LA Vol (A2C):   46.9 ml 26.94 ml/m  RA Volume:   29.20 ml  16.77 ml/m  LA Vol (A4C):   52.6 ml 30.21 ml/m  LA Biplane Vol: 51.6 ml 29.64 ml/m   AORTIC VALVE                     PULMONIC VALVE  AV Area (Vmax):    0.43 cm      PV Vmax:       1.06 m/s  AV Area (Vmean):   0.40 cm      PV  Peak grad:  4.5 mmHg  AV Area (VTI):     0.45 cm  AV Vmax:           387.00 cm/s  AV Vmean:          269.500 cm/s  AV VTI:            0.966 m  AV Peak Grad:      59.9 mmHg  AV Mean Grad:      33.5 mmHg  LVOT Vmax:         126.00 cm/s  LVOT Vmean:        82.100 cm/s  LVOT VTI:          0.327 m  LVOT/AV VTI ratio: 0.34  AI PHT:            748 msec  AR Vena Contracta: 0.60 cm    AORTA  Ao Root diam: 2.40 cm  Ao Arch diam: 2.4 cm   MITRAL VALVE  MV Area (PHT): 2.70 cm    SHUNTS  MV Decel Time: 281 msec    Systemic VTI:  0.33 m  MV E velocity: 65.10 cm/s  Systemic Diam: 1.30 cm  MV A velocity: 87.40 cm/s  MV E/A ratio:  0.74   Rozann Lesches MD  Electronically signed by Rozann Lesches MD  Signature Date/Time: 06/02/2022/7:25:55 PM        Final      Physicians  Panel Physicians Referring Physician Case Authorizing Physician  Sherren Mocha, MD (Primary)     Procedures  RIGHT HEART CATH AND CORONARY ANGIOGRAPHY   Conclusion  1.  Widely patent coronary arteries with mild nonobstructive plaquing in the mid LAD and otherwise normal coronary arteries 2.  Normal right heart hemodynamics with RA pressure mean of 2 mmHg, PA pressure 25/7 with a mean of 17 mmHg, and wedge pressure of 7 mmHg.  Preserved cardiac output at 4.8 L/min and cardiac index of 2.8 L/min/m. 3.  Severe aortic stenosis by echo assessment   Recommendations: The patient appears to be very well compensated.  Continue evaluation for treatment of symptomatic aortic stenosis.   Indications  Severe aortic stenosis [I35.0 (ICD-10-CM)]   Procedural Details  Technical Details INDICATION: Aortic stenosis, preop study  PROCEDURAL DETAILS: The right antecubital fossa was prepped, draped, and anesthetized with 1% lidocaine.  Using ultrasound, an antecubital vein was accessed via a front wall puncture and a 4/5 French sheath is inserted.  Ultrasound images are digitally captured and stored in the patient's  chart.  The right wrist was then prepped, draped, and anesthetized with 1% lidocaine.  Using vascular ultrasound guidance, the right radial artery was accessed via a front wall puncture.  A 5/6 French Slender sheath was placed in the right radial artery. Intra-arterial verapamil was administered through the radial artery sheath. IV heparin was administered after a JR4 catheter was advanced into the central aorta. A Swan-Ganz catheter was used for the right heart catheterization. Standard protocol was followed for recording of right heart pressures and sampling of oxygen saturations. Fick cardiac output was calculated. Standard Judkins catheters were used for selective coronary angiography. There were no immediate procedural complications. The patient was transferred to the post catheterization recovery area for further monitoring.      Estimated blood loss <50 mL.   During this procedure medications were administered to achieve and maintain moderate conscious sedation while the patient's heart rate, blood pressure, and oxygen saturation were continuously monitored and I was present face-to-face 100% of this time.   Medications (Filter: Administrations occurring from 1459 to 1605 on 06/02/22)  important  Continuous medications are totaled by the amount administered until 06/02/22 1605.   fentaNYL (SUBLIMAZE) injection (mcg)  Total dose: 25 mcg Date/Time Rate/Dose/Volume Action   06/02/22 1527 25 mcg Given   midazolam (VERSED) injection (mg)  Total dose: 2 mg Date/Time Rate/Dose/Volume Action   06/02/22 1528 2 mg Given   Radial Cocktail/Verapamil only (mL)  Total volume: 10 mL Date/Time Rate/Dose/Volume Action   06/02/22 1539 10 mL Given   Heparin (Porcine) in NaCl 1000-0.9 UT/500ML-% SOLN (mL)  Total volume: 1,000 mL Date/Time Rate/Dose/Volume Action   06/02/22 1555 500 mL Given   1557 500 mL Given   iohexol (OMNIPAQUE) 350  MG/ML injection (mL)  Total volume: 35 mL Date/Time  Rate/Dose/Volume Action   06/02/22 1559 35 mL Given   lidocaine (PF) (XYLOCAINE) 1 % injection (mL)  Total volume: 5 mL Date/Time Rate/Dose/Volume Action   06/02/22 1533 5 mL Given   heparin sodium (porcine) injection (Units)  Total dose: 4,000 Units Date/Time Rate/Dose/Volume Action   06/02/22 1548 4,000 Units Given   acetaminophen (TYLENOL) tablet 650 mg (mg)  Total dose: Cannot be calculated* Dosing weight: 74.4 *Administration dose not documented Date/Time Rate/Dose/Volume Action   06/02/22 1459 *Not included in total MAR Hold   atorvastatin (LIPITOR) tablet 10 mg (mg)  Total dose: Cannot be calculated* Dosing weight: 74.4 *Administration dose not documented Date/Time Rate/Dose/Volume Action   06/02/22 1459 *Not included in total MAR Hold   baclofen (LIORESAL) tablet 10 mg (mg)  Total dose: Cannot be calculated* Dosing weight: 74.4 *Administration dose not documented Date/Time Rate/Dose/Volume Action   06/02/22 1459 *Not included in total MAR Hold   [id number] *0 mg Duplicate   clonazePAM (KLONOPIN) tablet 0.5 mg (mg)  Total dose: Cannot be calculated* *Administration dose not documented Date/Time Rate/Dose/Volume Action   06/02/22 1459 *Not included in total MAR Hold   fentaNYL (SUBLIMAZE) injection 25 mcg (mcg)  Total dose: Cannot be calculated* Dosing weight: 74.4 *Administration dose not documented Date/Time Rate/Dose/Volume Action   06/02/22 1459 *Not included in total MAR Hold   fluticasone furoate-vilanterol (BREO ELLIPTA) 100-25 MCG/ACT 1 puff (puff)  Total dose: Cannot be calculated* *Administration dose not documented Date/Time Rate/Dose/Volume Action   06/02/22 1459 *Not included in total MAR Hold   heparin injection 5,000 Units (Units)  Total dose: Cannot be calculated* Dosing weight: 74.4 *Administration dose not documented Date/Time Rate/Dose/Volume Action   06/02/22 1459 *Not included in total MAR Hold   levothyroxine (SYNTHROID) tablet 100 mcg (mcg)  Total  dose: Cannot be calculated* Dosing weight: 74.4 *Administration dose not documented Date/Time Rate/Dose/Volume Action   06/02/22 1459 *Not included in total MAR Hold   ondansetron (ZOFRAN) injection 4 mg (mg)  Total dose: Cannot be calculated* Dosing weight: 74.4 *Administration dose not documented Date/Time Rate/Dose/Volume Action   06/02/22 1459 *Not included in total MAR Hold   pregabalin (LYRICA) capsule 75 mg (mg)  Total dose: Cannot be calculated* Dosing weight: 74.4 *Administration dose not documented Date/Time Rate/Dose/Volume Action   06/02/22 1459 *Not included in total MAR Hold   rOPINIRole (REQUIP) tablet 3 mg (mg)  Total dose: Cannot be calculated* Dosing weight: 74.4 *Administration dose not documented Date/Time Rate/Dose/Volume Action   06/02/22 1459 *Not included in total MAR Hold    Sedation Time  Sedation Time Physician-1: 25 minutes 6 seconds Contrast  Medication Name Total Dose  iohexol (OMNIPAQUE) 350 MG/ML injection 35 mL   Radiation/Fluoro  Fluoro time: 3.6 (min) DAP: 5.1 (Gycm2) Cumulative Air Kerma: [id number] (mGy) Complications  Complications documented before study signed (06/02/2022  [id number] PM)   No complications were associated with this study.  Documented by Bethann Punches, RN - 06/02/2022  4:07 PM     Coronary Findings  Diagnostic Dominance: Right Left Main  Vessel is angiographically normal.    Left Anterior Descending  There is mild diffuse disease throughout the vessel. There is mild plaquing in the mid LAD with no obstructive disease noted. There is no stenosis greater than 30% identified.    Ramus Intermedius  Vessel is small. Vessel is angiographically normal.    Left Circumflex  The vessel exhibits minimal luminal irregularities. The circumflex distribution is patent  with no significant stenosis. There is a single OM branch with no stenosis.    First Obtuse Marginal Branch  The vessel exhibits minimal luminal irregularities.     Right Coronary Artery  Vessel is large. The vessel exhibits minimal luminal irregularities. Large, dominant RCA. The PDA is large and has no stenosis. The PLA branches are small with no significant stenosis.    Intervention   No interventions have been documented.   Coronary Diagrams  Diagnostic Dominance: Right  Intervention   Implants   No implant documentation for this case.   Syngo Images   Show images for CARDIAC CATHETERIZATION Images on Long Term Storage   Show images for Jaynia, Dewey "[name]" Link to Procedure Log  Procedure Log    Hemo Data  Flowsheet Row Most Recent Value  Fick Cardiac Output 4.82 L/min  Fick Cardiac Output Index 2.77 (L/min)/BSA  RA A Wave 6 mmHg  RA V Wave 1 mmHg  RA Mean 2 mmHg  RV Systolic Pressure 23 mmHg  RV Diastolic Pressure 2 mmHg  RV EDP 4 mmHg  PA Systolic Pressure 25 mmHg  PA Diastolic Pressure 7 mmHg  PA Mean 17 mmHg  PW A Wave 8 mmHg  PW V Wave 8 mmHg  PW Mean 7 mmHg  AO Systolic Pressure [id number] mmHg  AO Diastolic Pressure 62 mmHg  AO Mean 85 mmHg  QP/QS 1  TPVR Index 6.15 HRUI  TSVR Index 30.73 HRUI  PVR SVR Ratio 0.12  TPVR/TSVR Ratio 0.2   Impression:  ADDENDUM REPORT: 06/07/2022 15:51   CLINICAL DATA:  Aortic valve replacement (TAVR), pre-op eval   EXAM: Cardiac TAVR CT   TECHNIQUE: The patient was scanned on a Siemens Force [id number] slice scanner. A 120 kV retrospective scan was triggered in the descending thoracic aorta at 111 HU's. Gantry rotation speed was 270 msecs and collimation was .9 mm. The 3D data set was reconstructed in 5% intervals of the R-R cycle. Systolic and diastolic phases were analyzed on a dedicated work station using MPR, MIP and VRT modes. The patient received 1100m OMNIPAQUE IOHEXOL 350 MG/ML SOLN of contrast.   FINDINGS: Aortic Valve:   Tricuspid aortic valve with moderately reduced cusp excursion. Severely thickened and mild-moderately calcified aortic valve cusps.   AV  calcium score: 319   Virtual Basal Annulus Measurements:   Maximum/Minimum Diameter: 24.5 x 19.3 mm   Perimeter: 69.7 mm   Area:  375 mm2   Trivial LVOT calcifications.   Membranous septal length: 4 mm   Based on these measurements, the annulus would be suitable for a 23 mm Sapien 3 valve. Alternatively, Heart Team can consider 26 mm Evolut valve however sinus measurements are borderline for this valve. Recommend Heart Team discussion for valve selection.   Sinus of Valsalva Measurements:   Non-coronary:  27 mm   Right - coronary:  25 mm   Left - coronary:  27 mm   Sinus of Valsalva Height:   Left: 18.3 mm   Right: 20.1 mm   Aorta: Conventional 3 vessel branch pattern of aortic arch. Aortic atherosclerosis.   Sinotubular Junction:  25 mm   Ascending Thoracic Aorta:  38 mm, mild dilation for age and BSA.   Aortic Arch:  26 mm   Descending Thoracic Aorta:  27 mm   Coronary Artery Height above Annulus:   Left main: 12.8 mm   Right coronary: 16.3 mm   Coronary Arteries: Normal coronary origin. Right dominance. The study was performed without use  of NTG and insufficient for plaque evaluation. Coronary artery calcium score is 0.   Optimum Fluoroscopic Angle for Delivery: LAO 7 CAU 8   OTHER:   Atria: Mild left atrial enlargement.   Left atrial appendage: No thrombus.   Mitral valve: Grossly normal, no mitral annular calcifications.   Pulmonary artery: Normal caliber.   Pulmonary veins: Normal anatomy.   IMPRESSION: 1. Tricuspid aortic valve with moderately reduced cusp excursion. Very severely thickened and mild-moderately calcified aortic valve cusps. 2. Aortic valve calcium score: 319 3. Annulus area: 375 mm2, suitable for 23 mm Sapien 3 valve. Trivial LVOT calcifications. Membranous septal length 4 mm. 4. Sufficient coronary artery heights from annulus. 5. Optimum fluoroscopic angle for delivery: LAO 7 CAU 8 6. Mild dilation of ascending  thoracic aorta at mid level, 38 mm.     Electronically Signed   By: Cherlynn Kaiser M.D.   On: 06/07/2022 15:51    Addended by Elouise Munroe, MD on 06/07/2022  3:53 PM    Study Result  Narrative & Impression  EXAM: OVER-READ INTERPRETATION  CT CHEST   The following report is a limited chest CT over-read performed by radiologist Dr. Vinnie Langton of [facility], [place] on 06/03/2022. This over-read does not include interpretation of cardiac or coronary anatomy or pathology. The coronary calcium score and cardiac CTA interpretation by the cardiologist is attached.   COMPARISON:  Chest CT 01/05/2018.   FINDINGS: Extracardiac findings will be described separately under dictation for contemporaneously obtained CTA chest, abdomen and pelvis.   IMPRESSION: Please see separate dictation for contemporaneously obtained CTA chest, abdomen and pelvis dated 06/03/2022 for full description of relevant extracardiac findings.   Electronically Signed: By: Vinnie Langton M.D. On: 06/03/2022 13:55       Narrative & Impression  CLINICAL DATA:  72 year old female with history of severe aortic stenosis. Preprocedural study prior to potential transcatheter aortic valve replacement (TAVR) procedure.   EXAM: CT ANGIOGRAPHY CHEST, ABDOMEN AND PELVIS   TECHNIQUE: Multidetector CT imaging through the chest, abdomen and pelvis was performed using the standard protocol during bolus administration of intravenous contrast. Multiplanar reconstructed images and MIPs were obtained and reviewed to evaluate the vascular anatomy.   RADIATION DOSE REDUCTION: This exam was performed according to the departmental dose-optimization program which includes automated exposure control, adjustment of the mA and/or kV according to patient size and/or use of iterative reconstruction technique.   CONTRAST:  143m OMNIPAQUE IOHEXOL 350 MG/ML SOLN   COMPARISON:  None Available.   FINDINGS: CTA  CHEST FINDINGS   Cardiovascular: Heart size is normal. There is no significant pericardial fluid, thickening or pericardial calcification. Aortic atherosclerosis. No definite coronary artery calcifications. Thickening and calcification of the aortic valve.   Mediastinum/Lymph Nodes: No pathologically enlarged mediastinal or hilar lymph nodes. Esophagus is unremarkable in appearance. No axillary lymphadenopathy.   Lungs/Pleura: Scattered areas of linear architectural distortion in the lungs bilaterally (left-greater-than-right), most compatible with areas of chronic post infectious or inflammatory scarring. Multiple calcified pleural plaques in the left hemithorax are noted. No definite calcified right-sided pleural plaques. No acute consolidative airspace disease. No pleural effusions. A few scattered small pulmonary nodules are noted, largest of which is in the periphery of the right upper lobe (axial image 36 of series 5) measuring 4 mm. No larger more suspicious appearing pulmonary nodules or masses are noted.   Musculoskeletal/Soft Tissues: There are no aggressive appearing lytic or blastic lesions noted in the visualized portions of the skeleton.  CTA ABDOMEN AND PELVIS FINDINGS   Hepatobiliary: 8 mm low-attenuation lesion in the central aspect of segment 4A of the liver, too small to definitively characterize, but statistically likely tiny cysts (no imaging follow-up recommended). No other suspicious hepatic lesions are noted. No intra or extrahepatic biliary ductal dilatation. Gallbladder is normal in appearance.   Pancreas: There are multiple low-attenuation regions noted throughout the pancreas, most evident in the body and tail, likely to represent extensive side branch ectasia and/or small cystic lesions, poorly evaluated on today's CT examination. No discrete solid appearing mass is identified. Diffuse dilatation of the pancreatic duct measuring up to 6 mm in the  neck of the pancreas is noted. No peripancreatic fluid collections or inflammatory changes are noted.   Spleen: Unremarkable.   Adrenals/Urinary Tract: Bilateral kidneys and bilateral adrenal glands are normal in appearance. No hydroureteronephrosis. Urinary bladder is normal in appearance.   Stomach/Bowel: The appearance of the stomach is normal. No pathologic dilatation of small bowel or colon. Normal appendix.   Vascular/Lymphatic: Atherosclerosis in the abdominal aorta and pelvic vasculature. No lymphadenopathy noted in the abdomen or pelvis.   Reproductive: Uterus and ovaries are unremarkable in appearance.   Other: No significant volume of ascites.  No pneumoperitoneum.   Musculoskeletal: There are no aggressive appearing lytic or blastic lesions noted in the visualized portions of the skeleton.   VASCULAR MEASUREMENTS PERTINENT TO TAVR:   AORTA:   Minimal Aortic Diameter-13 x 13 mm   Severity of Aortic Calcification-mild-to-moderate   RIGHT PELVIS:   Right Common Iliac Artery -   Minimal Diameter-7.1 x 8.6 mm   Tortuosity-mild   Calcification-mild   Right External Iliac Artery -   Minimal Diameter-5.3 x 5.2 mm   Tortuosity-mild   Calcification-mild   Right Common Femoral Artery -   Minimal Diameter-5.8 x 5.7 mm   Tortuosity-mild   Calcification-mild   LEFT PELVIS:   Left Common Iliac Artery -   Minimal Diameter-8.8 x 7.3 mm   Tortuosity-mild   Calcification-mild   Left External Iliac Artery -   Minimal Diameter-5.3 x 4.5 mm   Tortuosity-mild   Calcification-mild   Left Common Femoral Artery -   Minimal Diameter-5.8 x 5.7 mm   Tortuosity-mild   Calcification-mild   Review of the MIP images confirms the above findings.   IMPRESSION: 1. Vascular findings and measurements pertinent to potential TAVR procedure, as detailed above. 2. Severe thickening and calcification of the aortic valve, compatible with reported clinical  history of severe aortic stenosis. 3. Diffuse dilatation of the main pancreatic duct with hypovascular areas throughout the body and tail of the pancreas. This is poorly evaluated on today's arterial phase examination, but favored to represent diffuse side branch ectasia along with multiple cystic lesions throughout the body and tail of the pancreas, however underlying hypovascular mass or masses in these regions is not excluded. Follow-up nonemergent outpatient abdominal MRI with and without IV gadolinium with MRCP is recommended in the near future to better evaluate these findings and exclude underlying neoplasm. 4. Small pulmonary nodules measuring 4 mm or less in size, nonspecific, but statistically likely benign. No follow-up needed if patient is low-risk (and has no known or suspected primary neoplasm). Non-contrast chest CT can be considered in 12 months if patient is high-risk. This recommendation follows the consensus statement: Guidelines for Management of Incidental Pulmonary Nodules Detected on CT Images: From the Fleischner Society 2017; Radiology 2017; 284:228-243. 5. Multiple calcified left-sided pleural plaques, presumably sequela of prior left pleural  infection or hemorrhage. 6. Additional incidental findings, as above.     Electronically Signed   By: Vinnie Langton M.D.   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  She has also developed episodes of substernal chest discomfort and dizziness.  I have personally reviewed her 2D echocardiogram, cardiac catheterization, and CTA studies.  Her 2D echo shows a trileaflet aortic valve with marked thickening of the valve leaflets with mild calcification.  The mean gradient was  measured at 33.5 mmHg with a peak gradient of 60 mmHg and a calculated valve area of 0.45 cm.  Cardiac catheterization showed widely patent coronary arteries with normal right heart pressures.  I agree that aortic valve replacement is indicated in this patient for relief of her symptoms and to prevent left ventricular dysfunction.  Her gated cardiac CTA shows markedly thickened aortic valve leaflets with surprisingly little calcification.  I think that given her younger age, good functional status, and minimally calcified aortic valve leaflets that she would be best treated with open surgical aortic valve replacement using a bioprosthetic valve.  I reviewed her echo and CT images with her and answered her questions. I discussed the operative procedure with the patient and family including alternatives, benefits and risks; including but not limited to bleeding, blood transfusion, infection, stroke, myocardial infarction, graft failure, heart block requiring a permanent pacemaker, organ dysfunction, and death.  Shelda Matsunaga understands and agrees to proceed.  We will schedule surgery for Tuesday, 06/16/2022.  I spent 60 minutes performing this consultation and > 50% of this time was spent face to face counseling and coordinating the care of this patient's severe symptomatic aortic stenosis.  [name], MD 06/09/2022

## 2022-06-10 ENCOUNTER — Telehealth: Payer: Self-pay

## 2022-06-10 NOTE — Telephone Encounter (Signed)
Patient lvm stating no contact concerning sleep study referral.  Please advise.

## 2022-06-11 ENCOUNTER — Other Ambulatory Visit: Payer: Self-pay

## 2022-06-11 DIAGNOSIS — G4734 Idiopathic sleep related nonobstructive alveolar hypoventilation: Secondary | ICD-10-CM

## 2022-06-11 NOTE — Telephone Encounter (Signed)
Can you re-enter sleep study orders? The previous order auto-completed. Can you mark at future order?

## 2022-06-11 NOTE — Telephone Encounter (Signed)
Completed.

## 2022-06-11 NOTE — Pre-Procedure Instructions (Signed)
Surgical Instructions    Your procedure is scheduled on June 16, 2022.  Report to [facility] Main Entrance "A" at 5:30 A.M., then check in with the Admitting office.  Call this number if you have problems the morning of surgery:  [phone]  If you have any questions prior to your surgery date call [phone]: Open Monday-Friday 8am-4pm If you experience any cold or flu symptoms such as cough, fever, chills, shortness of breath, etc. between now and your scheduled surgery, please notify us at the above number.     Remember:  Do not eat or drink after midnight the night before your surgery    Take these medicines the morning of surgery with A SIP OF WATER:  atorvastatin (LIPITOR)   BREO ELLIPTA   fluticasone (FLONASE) nasal spray   levothyroxine (SYNTHROID)      May take these medicines IF NEEDED:  albuterol (VENTOLIN HFA) inhaler   baclofen (LIORESAL)    Continue to take your Aspirin through the day before surgery. DO NOT take any the morning of surgery.   As of today, STOP taking any Aleve, Naproxen, Ibuprofen, Motrin, Advil, Goody's, BC's, all herbal medications, fish oil, and all vitamins. This includes your medications: celecoxib (CELEBREX)                      Do NOT Smoke (Tobacco/Vaping) for 24 hours prior to your procedure.  If you use a CPAP at night, you may bring your mask/headgear for your overnight stay.   Contacts, glasses, piercing's, hearing aid's, dentures or partials may not be worn into surgery, please bring cases for these belongings.    For patients admitted to the hospital, discharge time will be determined by your treatment team.   Patients discharged the day of surgery will not be allowed to drive home, and someone needs to stay with them for 24 hours.  SURGICAL WAITING ROOM VISITATION Patients having surgery or a procedure may have no more than 2 support people in the waiting area - these visitors may rotate.   Children under the age of 90  must have an adult with them who is not the patient. If the patient needs to stay at the hospital during part of their recovery, the visitor guidelines for inpatient rooms apply. Pre-op nurse will coordinate an appropriate time for 1 support person to accompany patient in pre-op.  This support person may not rotate.   Please refer to the [facility] website for the visitor guidelines for Inpatients (after your surgery is over and you are in a regular room).    Special instructions:   [facility]- Preparing For Surgery  Before surgery, you can play an important role. Because skin is not sterile, your skin needs to be as free of germs as possible. You can reduce the number of germs on your skin by washing with CHG (chlorahexidine gluconate) Soap before surgery.  CHG is an antiseptic cleaner which kills germs and bonds with the skin to continue killing germs even after washing.    Oral Hygiene is also important to reduce your risk of infection.  Remember - BRUSH YOUR TEETH THE MORNING OF SURGERY WITH YOUR REGULAR TOOTHPASTE  Please do not use if you have an allergy to CHG or antibacterial soaps. If your skin becomes reddened/irritated stop using the CHG.  Do not shave (including legs and underarms) for at least 48 hours prior to first CHG shower. It is OK to shave your face.  Please follow  these instructions carefully.   Shower the NIGHT BEFORE SURGERY and the MORNING OF SURGERY  If you chose to wash your hair, wash your hair first as usual with your normal shampoo.  After you shampoo, rinse your hair and body thoroughly to remove the shampoo.  Use CHG Soap as you would any other liquid soap. You can apply CHG directly to the skin and wash gently with a scrungie or a clean washcloth.   Apply the CHG Soap to your body ONLY FROM THE NECK DOWN.  Do not use on open wounds or open sores. Avoid contact with your eyes, ears, mouth and genitals (private parts). Wash Face and genitals (private parts)   with your normal soap.   Wash thoroughly, paying special attention to the area where your surgery will be performed.  Thoroughly rinse your body with warm water from the neck down.  DO NOT shower/wash with your normal soap after using and rinsing off the CHG Soap.  Pat yourself dry with a CLEAN TOWEL.  Wear CLEAN PAJAMAS to bed the night before surgery  Place CLEAN SHEETS on your bed the night before your surgery  DO NOT SLEEP WITH PETS.   Day of Surgery: Take a shower with CHG soap. Do not wear jewelry or makeup Do not wear lotions, powders, perfumes/colognes, or deodorant. Do not shave 48 hours prior to surgery.  Men may shave face and neck. Do not bring valuables to the hospital.  [facility] is not responsible for any belongings or valuables. Do not wear nail polish, gel polish, artificial nails, or any other type of covering on natural nails (fingers and toes) If you have artificial nails or gel coating that need to be removed by a nail salon, please have this removed prior to surgery. Artificial nails or gel coating may interfere with anesthesia's ability to adequately monitor your vital signs.  Wear Clean/Comfortable clothing the morning of surgery Remember to brush your teeth WITH YOUR REGULAR TOOTHPASTE.   Please read over the following fact sheets that you were given.    If you received a COVID test during your pre-op visit  it is requested that you wear a mask when out in public, stay away from anyone that may not be feeling well and notify your surgeon if you develop symptoms. If you have been in contact with anyone that has tested positive in the last 10 days please notify you surgeon.

## 2022-06-12 ENCOUNTER — Ambulatory Visit (HOSPITAL_COMMUNITY)
Admission: RE | Admit: 2022-06-12 | Discharge: 2022-06-12 | Disposition: A | Discharge status: Home / Self Care | Payer: No Typology Code available for payment source | Source: Ambulatory Visit | Attending: Surgery | Admitting: Surgery

## 2022-06-12 ENCOUNTER — Other Ambulatory Visit: Payer: Self-pay

## 2022-06-12 ENCOUNTER — Encounter (HOSPITAL_COMMUNITY)
Admission: RE | Admit: 2022-06-12 | Discharge: 2022-06-12 | Disposition: A | Discharge status: Home / Self Care | Payer: No Typology Code available for payment source | Source: Ambulatory Visit | Attending: Surgery | Admitting: Surgery

## 2022-06-12 ENCOUNTER — Encounter (HOSPITAL_COMMUNITY): Payer: Self-pay

## 2022-06-12 VITALS — BP 131/78 | HR 65 | Temp 98.1°F | Resp 17 | Ht 65.0 in | Wt 168.6 lb

## 2022-06-12 DIAGNOSIS — Z01818 Encounter for other preprocedural examination: Secondary | ICD-10-CM | POA: Diagnosis not present

## 2022-06-12 DIAGNOSIS — E785 Hyperlipidemia, unspecified: Secondary | ICD-10-CM | POA: Insufficient documentation

## 2022-06-12 DIAGNOSIS — Z1152 Encounter for screening for COVID-19: Secondary | ICD-10-CM | POA: Diagnosis not present

## 2022-06-12 DIAGNOSIS — F17200 Nicotine dependence, unspecified, uncomplicated: Secondary | ICD-10-CM | POA: Diagnosis not present

## 2022-06-12 DIAGNOSIS — Z79899 Other long term (current) drug therapy: Secondary | ICD-10-CM | POA: Insufficient documentation

## 2022-06-12 DIAGNOSIS — I350 Nonrheumatic aortic (valve) stenosis: Secondary | ICD-10-CM | POA: Insufficient documentation

## 2022-06-12 HISTORY — DX: Myoneural disorder, unspecified: G70.9

## 2022-06-12 HISTORY — DX: Cardiac murmur, unspecified: R01.1

## 2022-06-12 HISTORY — DX: Unspecified osteoarthritis, unspecified site: M19.90

## 2022-06-12 HISTORY — DX: Anxiety disorder, unspecified: F41.9

## 2022-06-12 HISTORY — DX: Hypothyroidism, unspecified: E03.9

## 2022-06-12 LAB — URINALYSIS, ROUTINE W REFLEX MICROSCOPIC
Bilirubin Urine: NEGATIVE
Glucose, UA: NEGATIVE mg/dL
Hgb urine dipstick: NEGATIVE
Ketones, ur: NEGATIVE mg/dL
Leukocytes,Ua: NEGATIVE
Nitrite: NEGATIVE
Protein, ur: NEGATIVE mg/dL
Specific Gravity, Urine: 1.005 (ref 1.005–1.030)
pH: 6 (ref 5.0–8.0)

## 2022-06-12 LAB — BLOOD GAS, ARTERIAL
Acid-Base Excess: 1.7 mmol/L (ref 0.0–2.0)
Bicarbonate: 25.8 mmol/L (ref 20.0–28.0)
Drawn by: 6643
O2 Saturation: 96.7 %
Patient temperature: 37
pCO2 arterial: 38 mmHg (ref 32–48)
pH, Arterial: 7.44 (ref 7.35–7.45)
pO2, Arterial: 74 mmHg — ABNORMAL LOW (ref 83–108)

## 2022-06-12 LAB — SURGICAL PCR SCREEN
MRSA, PCR: NEGATIVE
Staphylococcus aureus: NEGATIVE

## 2022-06-12 LAB — COMPREHENSIVE METABOLIC PANEL
ALT: 25 U/L (ref 0–44)
AST: 25 U/L (ref 15–41)
Albumin: 3.5 g/dL (ref 3.5–5.0)
Alkaline Phosphatase: 67 U/L (ref 38–126)
Anion gap: 13 (ref 5–15)
BUN: 15 mg/dL (ref 8–23)
CO2: 20 mmol/L — ABNORMAL LOW (ref 22–32)
Calcium: 9.4 mg/dL (ref 8.9–10.3)
Chloride: 104 mmol/L (ref 98–111)
Creatinine, Ser: 0.73 mg/dL (ref 0.44–1.00)
GFR, Estimated: >60 mL/min (ref >60)
Glucose, Bld: 96 mg/dL (ref 70–99)
Potassium: 4 mmol/L (ref 3.5–5.1)
Sodium: 137 mmol/L (ref 135–145)
Total Bilirubin: 0.4 mg/dL (ref 0.3–1.2)
Total Protein: 6.2 g/dL — ABNORMAL LOW (ref 6.5–8.1)

## 2022-06-12 LAB — CBC
HCT: 43.6 % (ref 36.0–46.0)
Hemoglobin: 14.3 g/dL (ref 12.0–15.0)
MCH: 29.2 pg (ref 26.0–34.0)
MCHC: 32.8 g/dL (ref 30.0–36.0)
MCV: 89.2 fL (ref 80.0–100.0)
Platelets: 191 10*3/uL (ref 150–400)
RBC: 4.89 MIL/uL (ref 3.87–5.11)
RDW: 13.4 % (ref 11.5–15.5)
WBC: 8.3 10*3/uL (ref 4.0–10.5)
nRBC: 0 % (ref 0.0–0.2)

## 2022-06-12 LAB — SARS CORONAVIRUS 2 (TAT 6-24 HRS): SARS Coronavirus 2: NEGATIVE

## 2022-06-12 LAB — PROTIME-INR
INR: 1 (ref 0.8–1.2)
Prothrombin Time: 13.5 seconds (ref 11.4–15.2)

## 2022-06-12 LAB — APTT: aPTT: 37 seconds — ABNORMAL HIGH (ref 24–36)

## 2022-06-12 NOTE — Progress Notes (Signed)
PCP - Dr. Rosanne Gutting Cardiologist - Dr. Kirk Ruths  PPM/ICD - Denies Device Orders - n/a Rep Notified - n/a  Chest x-ray - 06/12/2022 EKG - 06/01/2022 Stress Test - Denies ECHO - 06/02/2022 Cardiac Cath - 06/02/2022  Sleep Study - Per pt, borderline positive sleep study three years ago, but is having issues with MD ordering her a CPAP.  No DM  Last dose of GLP1 agonist- n/a   GLP1 instructions: n/a  Blood Thinner Instructions: n/a Aspirin Instructions: Continue to take ASA through the day before surgery. Pt will not take any morning of surgery  NPO after midnight  COVID TEST- Yes. Result pending.   Anesthesia review: No.  Patient denies shortness of breath, fever, cough and chest pain at PAT appointment. Pt denies any respiratory illness/infection in the last two months.   All instructions explained to the patient, with a verbal understanding of the material. Patient agrees to go over the instructions while at home for a better understanding. Patient also instructed to self quarantine after being tested for COVID-19. The opportunity to ask questions was provided.

## 2022-06-13 LAB — HEMOGLOBIN A1C
Hgb A1c MFr Bld: 5.8 % — ABNORMAL HIGH (ref 4.8–5.6)
Mean Plasma Glucose: 120 mg/dL

## 2022-06-15 MED ORDER — TRANEXAMIC ACID (OHS) BOLUS VIA INFUSION
15.0000 mg/kg | INTRAVENOUS | Status: AC
  Administered 2022-06-16: 1147.5 mg via INTRAVENOUS
  Filled 2022-06-15: qty 1148

## 2022-06-15 MED ORDER — NITROGLYCERIN IN D5W 200-5 MCG/ML-% IV SOLN
2.0000 ug/min | INTRAVENOUS | Status: AC
  Administered 2022-06-16: 5 ug/min via INTRAVENOUS
  Filled 2022-06-15: qty 250

## 2022-06-15 MED ORDER — MILRINONE LACTATE IN DEXTROSE 20-5 MG/100ML-% IV SOLN
0.3000 ug/kg/min | INTRAVENOUS | Status: DC
  Filled 2022-06-15: qty 100

## 2022-06-15 MED ORDER — NOREPINEPHRINE 4 MG/250ML-% IV SOLN
0.0000 ug/min | INTRAVENOUS | Status: DC
  Filled 2022-06-15: qty 250

## 2022-06-15 MED ORDER — CEFAZOLIN SODIUM-DEXTROSE 2-4 GM/100ML-% IV SOLN
2.0000 g | INTRAVENOUS | Status: DC
  Filled 2022-06-15: qty 100

## 2022-06-15 MED ORDER — TRANEXAMIC ACID (OHS) PUMP PRIME SOLUTION
2.0000 mg/kg | INTRAVENOUS | Status: DC
  Filled 2022-06-15: qty 1.53

## 2022-06-15 MED ORDER — PHENYLEPHRINE HCL-NACL 20-0.9 MG/250ML-% IV SOLN
30.0000 ug/min | INTRAVENOUS | Status: AC
  Administered 2022-06-16: 30 ug/min via INTRAVENOUS
  Filled 2022-06-15: qty 250

## 2022-06-15 MED ORDER — DEXMEDETOMIDINE HCL IN NACL 400 MCG/100ML IV SOLN
0.1000 ug/kg/h | INTRAVENOUS | Status: AC
  Administered 2022-06-16: .2 ug/kg/h via INTRAVENOUS
  Filled 2022-06-15: qty 100

## 2022-06-15 MED ORDER — HEPARIN 30,000 UNITS/1000 ML (OHS) CELLSAVER SOLUTION
Status: DC
  Filled 2022-06-15: qty 1000

## 2022-06-15 MED ORDER — CEFAZOLIN SODIUM-DEXTROSE 2-4 GM/100ML-% IV SOLN
2.0000 g | INTRAVENOUS | Status: AC
  Administered 2022-06-16: 2 g via INTRAVENOUS
  Filled 2022-06-15: qty 100

## 2022-06-15 MED ORDER — POTASSIUM CHLORIDE 2 MEQ/ML IV SOLN
80.0000 meq | INTRAVENOUS | Status: DC
  Filled 2022-06-15: qty 40

## 2022-06-15 MED ORDER — INSULIN REGULAR(HUMAN) IN NACL 100-0.9 UT/100ML-% IV SOLN
INTRAVENOUS | Status: AC
  Administered 2022-06-16: 1 [IU]/h via INTRAVENOUS
  Filled 2022-06-15: qty 100

## 2022-06-15 MED ORDER — VANCOMYCIN HCL 1250 MG/250ML IV SOLN
1250.0000 mg | INTRAVENOUS | Status: AC
  Administered 2022-06-16: 1250 mg via INTRAVENOUS
  Filled 2022-06-15: qty 250

## 2022-06-15 MED ORDER — MANNITOL 20 % IV SOLN
INTRAVENOUS | Status: DC
  Filled 2022-06-15: qty 13

## 2022-06-15 MED ORDER — PLASMA-LYTE A IV SOLN
INTRAVENOUS | Status: DC
  Filled 2022-06-15: qty 2.5

## 2022-06-15 MED ORDER — EPINEPHRINE HCL 5 MG/250ML IV SOLN IN NS
0.0000 ug/min | INTRAVENOUS | Status: DC
  Filled 2022-06-15: qty 250

## 2022-06-15 MED ORDER — TRANEXAMIC ACID 1000 MG/10ML IV SOLN
1.5000 mg/kg/h | INTRAVENOUS | Status: AC
  Administered 2022-06-16: 1.5 mg/kg/h via INTRAVENOUS
  Filled 2022-06-15: qty 25

## 2022-06-15 NOTE — Anesthesia Preprocedure Evaluation (Signed)
Anesthesia Evaluation  Patient identified by MRN, date of birth, ID band Patient awake    Reviewed: Allergy & Precautions, NPO status , Patient's Chart, lab work & pertinent test results  Airway Mallampati: II  TM Distance: >3 FB Neck ROM: Full    Dental no notable dental hx.    Pulmonary COPD, Current Smoker and Patient abstained from smoking.   Pulmonary exam normal        Cardiovascular + angina  + Valvular Problems/Murmurs AS  Rhythm:Regular Rate:Normal + Systolic murmurs ECHO [id number]:   1. Left ventricular ejection fraction, by estimation, is 65 to 70%. The  left ventricle has normal function. The left ventricle has no regional  wall motion abnormalities. There is mild left ventricular hypertrophy.  Left ventricular diastolic parameters  are consistent with Grade I diastolic dysfunction (impaired relaxation).   2. Right ventricular systolic function is normal. The right ventricular  size is normal. Tricuspid regurgitation signal is inadequate for assessing  PA pressure.   3. Left atrial size was upper normal.   4. The mitral valve is grossly normal, mildly calcified. Trivial mitral  valve regurgitation.   5. 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 There is moderate  calcification of the aortic valve. Aortic valve regurgitation is mild to  moderate. Moderate to severe aortic valve stenosis, upper end of scale.  Aortic regurgitation PHT measures 748 msec.  Aortic valve mean gradient measures 33.5 mmHg. Dimentionless index 0.34.   6. The inferior vena cava is normal in size with <50% respiratory  variability, suggesting right atrial pressure of 8 mmHg.   Comparison(s): Prior images reviewed side by side. Aortic valve is  functionally bicuspid with evidence of moderate to severe stenosis, mean  AV gradient up to 34 mmHg and dimentionless index 0.34.     Neuro/Psych   Anxiety Depression    negative neurological ROS      GI/Hepatic negative GI ROS, Neg liver ROS,,,  Endo/Other  Hypothyroidism    Renal/GU negative Renal ROS  negative genitourinary   Musculoskeletal   Abdominal Normal abdominal exam  (+)   Peds  Hematology negative hematology ROS (+) Lab Results      Component                Value               Date                      WBC                      8.3                 06/12/2022                HGB                      14.3                06/12/2022                HCT                      43.6                06/12/2022                MCV  89.2                06/12/2022                PLT                      191                 06/12/2022             Lab Results      Component                Value               Date                      NA                       137                 06/12/2022                K                        4.0                 06/12/2022                CO2                      20 (L)              06/12/2022                GLUCOSE                  96                  06/12/2022                BUN                      15                  06/12/2022                CREATININE               0.73                06/12/2022                CALCIUM                  9.4                 06/12/2022                EGFR                     94                  05/18/2022                GFRNONAA                 >60  06/12/2022              Anesthesia Other Findings   Reproductive/Obstetrics                             Anesthesia Physical Anesthesia Plan  ASA: 4  Anesthesia Plan: General   Post-op Pain Management:    Induction: Intravenous  PONV Risk Score and Plan: 2 and Midazolam and Treatment may vary due to age or medical condition  Airway Management Planned: Mask and Oral ETT  Additional Equipment: Arterial line, CVP, PA Cath, TEE, 3D TEE and Ultrasound Guidance Line Placement  Intra-op Plan:    Post-operative Plan: Post-operative intubation/ventilation  Informed Consent: I have reviewed the patients History and Physical, chart, labs and discussed the procedure including the risks, benefits and alternatives for the proposed anesthesia with the patient or authorized representative who has indicated his/her understanding and acceptance.     Dental advisory given  Plan Discussed with: CRNA  Anesthesia Plan Comments:        Anesthesia Quick Evaluation

## 2022-06-15 NOTE — H&P (Signed)
Carolyn [name]       [place] [address]             [phone]      Cardiothoracic Surgery Admission History and Physical   PCP is [name], DO Referring Provider is Lenna Sciara, MD Primary Cardiologist is Kirk Ruths, MD   Reason for admission: Severe aortic stenosis and mild to moderate aortic insufficiency   HPI:   The patient is a 72 year old woman with a history of hyperlipidemia, smoking and COPD, hypothyroidism, and moderate to severe aortic stenosis who reports progressive exertional fatigue and shortness of breath over the past year with recent episodes of substernal chest discomfort at rest and with exertion as well as episodes of dizziness.  She was recently seen in the emergency department on 06/01/2022 due to chest discomfort and dizziness.  She says she felt like she was going to pass out.  High-sensitivity troponin was negative.  There were no significant ECG changes.  A repeat echocardiogram showed a functionally bicuspid aortic valve with markedly thickened leaflets.  There is mild calcification.  The mean gradient was measured at 33.5 mmHg with a dimensionless index of 0.34 with mild to moderate aortic insufficiency.  Left ventricular ejection fraction was 65 to 70% with mild LVH and grade 1 diastolic dysfunction.  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  Right heart pressures were normal with a cardiac index of 2.8.  She was seen by Dr. Ali Lowe for consideration of TAVR.  A gated cardiac CTA showed a trileaflet aortic valve with severely thickened and mild to moderately calcified aortic valve cusps.  The aortic valve calcium score was only 319.   Since discharge she reports that she has continued to have exertional fatigue and shortness of breath.  She has had some twinges of chest discomfort with and without exertion.  She denies any orthopnea or PND.  She  has continued to smoke about half pack of cigarettes per day but is smoked more in the past.       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    left upper arm   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    left nare   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    left upper shin   SCC (squamous cell carcinoma) in situ 03/19/2016    right pinky   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    right thigh, left upper shin   SCC (squamous cell carcinoma) well diff 12/13/2008    left hand   SCC (squamous cell carcinoma) well diff x2 12/25/2015    left and right forearm   Thyroid disease             Past Surgical History:  Procedure Laterality Date   CERVICAL CONIZATION W/BX       EYE SURGERY Bilateral      Cataracts   MELANOMA EXCISION   08/2014    back   RIGHT HEART CATH AND CORONARY ANGIOGRAPHY N/A 06/02/2022    Procedure: RIGHT HEART CATH AND CORONARY ANGIOGRAPHY;  Surgeon: Sherren Mocha, MD;  Location: [facility];  Service: Cardiovascular;  Laterality: N/A;   SHOULDER SURGERY Right             Family History  Problem Relation Age of Onset   Heart disease Mother     Aneurysm Mother     Heart disease Father     Kidney failure Father     Cancer Sister     Cancer Brother     Cirrhosis Daughter     Cancer Sister          Bronchial      Social History         Socioeconomic History   Marital status: Divorced      Spouse  name: Not on file   Number of children: 2   Years of education: Not on file   Highest education level: GED or equivalent  Occupational History   Occupation: disabled  Tobacco Use   Smoking status: Every Day      Packs/day: 0.50      Types: Cigarettes      Start date: 04/06/1968      Passive exposure: Never   Smokeless tobacco: Never  Vaping Use   Vaping Use: Never used  Substance and Sexual Activity   Alcohol use: Never   Drug use: No   Sexual activity: Not on file  Other Topics Concern   Not on file  Social History Narrative    Lives alone    R handed    Caffeine: half a litter of soda in a day.     Social Determinants of Health        Financial Resource Strain: Low Risk  (05/12/2018)    Overall Financial Resource Strain (CARDIA)     Difficulty of Paying Living Expenses: Not hard at all  Food Insecurity: No Food Insecurity (06/04/2022)    Hunger Vital Sign     Worried About Running Out of Food in the Last Year: Never true     Ran Out of Food in the Last Year: Never true  Transportation Needs: No Transportation Needs (06/04/2022)    PRAPARE - Armed forces logistics/support/administrative officer (Medical): No     Lack of Transportation (Non-Medical): No  Physical Activity: Inactive (05/12/2018)    Exercise Vital Sign     Days of Exercise per Week: 0 days     Minutes of Exercise per Session: 0 min  Stress: No Stress Concern Present (05/12/2018)    [place]     Feeling of Stress : Not at all  Social Connections: Somewhat Isolated (05/12/2018)    Social Connection and Isolation Panel [NHANES]     Frequency of Communication with Friends and Family: More than three times a week     Frequency of Social Gatherings with Friends and Family: More than three times a week     Attends Religious Services: More than 4 times per year     Active Member of [employer] or Organizations: No     Attends Archivist Meetings: Never      Marital Status: Divorced  Intimate Partner Violence: Not At Risk (06/01/2022)    Humiliation, Afraid, Rape, and Kick questionnaire     Fear of Current or Ex-Partner: No     Emotionally Abused: No     Physically Abused: No     Sexually Abused: No             Prior to Admission medications   Medication Sig Start Date End Date Taking? Authorizing Provider  aspirin 81 MG EC tablet TAKE 1 TABLET BY MOUTH EVERY DAY Patient taking differently: Take 81 mg by mouth daily. 11/11/18   Yes Terald Sleeper, PA-C  atorvastatin (LIPITOR) 10 MG tablet TAKE 1 TABLET BY MOUTH EVERY DAY 02/24/22   Yes [name], DO  baclofen (LIORESAL) 10 MG tablet TAKE 1 TABLET BY MOUTH THREE TIMES A DAY 02/24/22   Yes Matthews, Cody, DO  BREO ELLIPTA 100-25 MCG/ACT AEPB TAKE 1 PUFF BY MOUTH EVERY DAY Patient taking differently: Inhale 1 puff into the lungs daily. 02/24/22   Yes [name], DO  Calcium Carbonate (CALCIUM 600 PO) Take 600 mg by mouth at bedtime.     Yes [provider]  clonazePAM (KLONOPIN) 0.5 MG tablet TAKE 1 TABLET BY MOUTH EVERYDAY AT BEDTIME Patient taking differently: Take 0.5 mg by mouth at bedtime. 05/25/22   Yes [name], DO  ferrous sulfate 325 (65 FE) MG EC tablet Take 1 tablet (325 mg total) by mouth daily with breakfast. 03/26/22   Yes Chima, Anderson Malta, MD  fluticasone (FLONASE) 50 MCG/ACT nasal spray INSTILL 1 SPRAY INTO BOTH NOSTRILS DAILY Patient taking differently: Place 1 spray into both nostrils daily. 04/10/22   Yes [name], DO  Garlic [id number] MG CAPS Take 1,000 mg by mouth daily.     Yes [provider]  levothyroxine (SYNTHROID) 100 MCG tablet TAKE 1 TABLET BY MOUTH DAILY BEFORE BREAKFAST. 10/27/21   Yes [name], DO  Multiple Vitamins-Minerals (CENTRUM SILVER ADULT 50+ PO) Take 1 tablet by mouth daily.     Yes [provider]  Omega-3 Fatty Acids (FISH OIL) 1000 MG CAPS Take 1,000 mg by mouth daily.     Yes [provider]   pregabalin (LYRICA) 75 MG capsule Take 1 capsule (75 mg total) by mouth at bedtime as needed. Patient taking differently: Take 75 mg by mouth at bedtime. 03/25/22   Yes Genia Harold, MD  rOPINIRole (REQUIP) 3 MG tablet TAKE 1 TABLET BY MOUTH AT BEDTIME. Patient taking differently: Take 3 mg by mouth at bedtime. 04/10/22   Yes [name], DO            Current Outpatient Medications  Medication Sig Dispense Refill   aspirin 81 MG EC tablet TAKE 1 TABLET BY MOUTH EVERY DAY (Patient taking differently: Take 81 mg by mouth daily.) 90 tablet 3   atorvastatin (LIPITOR) 10 MG tablet TAKE 1 TABLET BY MOUTH EVERY DAY 90 tablet 3   baclofen (LIORESAL) 10 MG tablet TAKE 1 TABLET BY MOUTH THREE TIMES A DAY 270 tablet 1   BREO ELLIPTA 100-25 MCG/ACT AEPB TAKE 1 PUFF BY MOUTH EVERY DAY (Patient taking differently: Inhale 1 puff into the lungs daily.) 60 each 11   Calcium Carbonate (CALCIUM 600 PO) Take 600 mg by mouth at bedtime.       clonazePAM (KLONOPIN) 0.5 MG tablet TAKE 1 TABLET BY MOUTH EVERYDAY AT BEDTIME (Patient taking differently: Take 0.5 mg by mouth at bedtime.) 30 tablet 0   ferrous sulfate 325 (65 FE) MG EC  tablet Take 1 tablet (325 mg total) by mouth daily with breakfast. 30 tablet 5   fluticasone (FLONASE) 50 MCG/ACT nasal spray INSTILL 1 SPRAY INTO BOTH NOSTRILS DAILY (Patient taking differently: Place 1 spray into both nostrils daily.) 48 mL 1   Garlic [id number] MG CAPS Take 1,000 mg by mouth daily.       levothyroxine (SYNTHROID) 100 MCG tablet TAKE 1 TABLET BY MOUTH DAILY BEFORE BREAKFAST. 90 tablet 3   Multiple Vitamins-Minerals (CENTRUM SILVER ADULT 50+ PO) Take 1 tablet by mouth daily.       Omega-3 Fatty Acids (FISH OIL) 1000 MG CAPS Take 1,000 mg by mouth daily.       pregabalin (LYRICA) 75 MG capsule Take 1 capsule (75 mg total) by mouth at bedtime as needed. (Patient taking differently: Take 75 mg by mouth at bedtime.) 30 capsule 5   rOPINIRole (REQUIP) 3 MG tablet TAKE 1 TABLET  BY MOUTH AT BEDTIME. (Patient taking differently: Take 3 mg by mouth at bedtime.) 90 tablet 0    No current facility-administered medications for this visit.          Allergies  Allergen Reactions   Trazodone And Nefazodone Shortness Of Breath   Azithromycin Rash          Review of Systems:               General:                      normal appetite, + decreased energy, no weight gain, no weight loss, no fever             Cardiac:                       + chest pain with exertion, + chest pain at rest, +SOB with  exertion, no resting SOB, no PND, no orthopnea, no palpitations, no arrhythmia, no atrial fibrillation, no LE edema, + dizzy spells, no syncope             Respiratory:                 + exertional shortness of breath, no home oxygen, no productive cough, + dry cough, no bronchitis, no wheezing, no hemoptysis, no asthma, no pain with inspiration or cough, no sleep apnea, no CPAP at night             GI:                               no difficulty swallowing, no reflux, no frequent heartburn, no hiatal hernia, no abdominal pain, + constipation, no diarrhea, no hematochezia, no hematemesis, no melena             GU:                              no dysuria,  + frequency, no urinary tract infection, no hematuria, no kidney stones, no kidney disease             Vascular:                     no pain suggestive of claudication, no pain in feet, no leg cramps, no varicose veins, no DVT, no non-healing foot ulcer             Neuro:  no stroke, no TIA's, no seizures, no headaches, no temporary blindness one eye,  no slurred speech, no peripheral neuropathy, no chronic pain, no instability of gait, no memory/cognitive dysfunction             Musculoskeletal:         + arthritis, no joint swelling, + myalgias, n difficulty walking, o mobility              Skin:                            no rash, no itching, no skin infections, no pressure sores or ulcerations              Psych:                         no anxiety, no depression, no nervousness, no unusual recent stress             Eyes:                           no blurry vision, no floaters, no recent vision changes, + wears glasses              ENT:                            no hearing loss, no loose or painful teeth, + dentures, last saw dentist 2023             Hematologic:               + easy bruising, no abnormal bleeding, no clotting disorder, no frequent epistaxis             Endocrine:                   no diabetes, does not check CBG's at home                            Physical Exam:               BP 126/70   Pulse 85   Resp 20   Ht 5' (1.524 m)   Wt 168 lb (76.2 kg)   SpO2 92% Comment: RA  BMI 32.81 kg/m              General:                      well-appearing             HEENT:                       Unremarkable, NCAT, PERLA, EOMI             Neck:                           no JVD, no bruits, no adenopathy              Chest:                          clear to auscultation, symmetrical breath sounds, + wheezes, no rhonchi  CV:                              RRR, 3/6 systolic murmur RSB, no diastolic murmur             Abdomen:                    soft, non-tender, no masses              Extremities:                 warm, well-perfused, pedal pulses palpable, lower extremity edema             Rectal/GU                   Deferred             Neuro:                         Grossly non-focal and symmetrical throughout             Skin:                            Clean and dry, no rashes, no breakdown   Diagnostic Tests:   ECHOCARDIOGRAM REPORT       Patient Name:   WINNELL CALDERARO Date of Exam: 06/02/2022  Medical Rec #:  [id number]        Height:       60.0 in  Accession #:    [id number]       Weight:       169.8 lb  Date of Birth:  28-Jan-1951       BSA:          1.741 m  Patient Age:    52 years         BP:           117/63 mmHg  Patient Gender: F                HR:            59 bpm.  Exam Location:  Inpatient   Procedure: 2D Echo, Cardiac Doppler and Color Doppler   Indications:    I35.0 Nonrheumatic aortic (valve) stenosis    History:        Patient has prior history of Echocardiogram examinations,  most                 recent 12/15/2021. COPD, Aortic Valve Disease,                  Signs/Symptoms:Murmur; Risk Factors:Current Smoker.    Sonographer:    Wilkie Aye RVT RCS  Referring Phys: [id number] [facility]     Sonographer Comments: Image acquisition challenging due to respiratory  motion.  IMPRESSIONS     1. Left ventricular ejection fraction, by estimation, is 65 to 70%. The  left ventricle has normal function. The left ventricle has no regional  wall motion abnormalities. There is mild left ventricular hypertrophy.  Left ventricular diastolic parameters  are consistent with Grade I diastolic dysfunction (impaired relaxation).   2. Right ventricular systolic function is normal. The right ventricular  size is normal. Tricuspid regurgitation signal is inadequate for assessing  PA pressure.   3. Left atrial size was upper normal.   4.  The mitral valve is grossly normal, mildly calcified. Trivial mitral  valve regurgitation.   5. 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 There is moderate  calcification of the aortic valve. Aortic valve regurgitation is mild to  moderate. Moderate to severe aortic valve stenosis, upper end of scale.  Aortic regurgitation PHT measures 748 msec.  Aortic valve mean gradient measures 33.5 mmHg. Dimentionless index 0.34.   6. The inferior vena cava is normal in size with <50% respiratory  variability, suggesting right atrial pressure of 8 mmHg.   Comparison(s): Prior images reviewed side by side. Aortic valve is  functionally bicuspid with evidence of moderate to severe stenosis, mean  AV gradient up to 34 mmHg and dimentionless index 0.34.   FINDINGS   Left Ventricle: Left ventricular ejection fraction, by  estimation, is 65  to 70%. The left ventricle has normal function. The left ventricle has no  regional wall motion abnormalities. The left ventricular internal cavity  size was normal in size. There is   mild left ventricular hypertrophy. Left ventricular diastolic parameters  are consistent with Grade I diastolic dysfunction (impaired relaxation).   Right Ventricle: The right ventricular size is normal. No increase in  right ventricular wall thickness. Right ventricular systolic function is  normal. Tricuspid regurgitation signal is inadequate for assessing PA  pressure.   Left Atrium: Left atrial size was upper normal.   Right Atrium: Right atrial size was normal in size.   Pericardium: There is no evidence of pericardial effusion.   Mitral Valve: The mitral valve is grossly normal. There is mild  calcification of the mitral valve leaflet(s). Trivial mitral valve  regurgitation.   Tricuspid Valve: The tricuspid valve is grossly normal. Tricuspid valve  regurgitation is trivial.   Aortic Valve: The aortic valve is bicuspid. There is moderate  calcification of the aortic valve. There is mild to moderate aortic valve  annular calcification. Aortic valve regurgitation is mild to moderate.  Aortic regurgitation PHT measures 748 msec.  Moderate to severe aortic stenosis is present. Aortic valve mean gradient  measures 33.5 mmHg. Aortic valve peak gradient measures 59.9 mmHg. Aortic  valve area, by VTI measures 0.45 cm.   Pulmonic Valve: The pulmonic valve was grossly normal. Pulmonic valve  regurgitation is trivial.   Aorta: The aortic root is normal in size and structure.   Venous: The inferior vena cava is normal in size with less than 50%  respiratory variability, suggesting right atrial pressure of 8 mmHg.   IAS/Shunts: No atrial level shunt detected by color flow Doppler.     LEFT VENTRICLE  PLAX 2D  LVIDd:         4.10 cm   Diastology  LVIDs:         2.70 cm   LV e'  medial:    4.67 cm/s  LV PW:         1.20 cm   LV E/e' medial:  13.9  LV IVS:        1.10 cm   LV e' lateral:   4.26 cm/s  LVOT diam:     1.30 cm   LV E/e' lateral: 15.3  LV SV:         43  LV SV Index:   25  LVOT Area:     1.33 cm     RIGHT VENTRICLE            IVC  RV Basal diam:  3.90 cm    IVC diam: 2.00  cm  RV S prime:     5.86 cm/s  TAPSE (M-mode): 2.1 cm   LEFT ATRIUM             Index        RIGHT ATRIUM           Index  LA diam:        3.00 cm 1.72 cm/m   RA Area:     12.70 cm  LA Vol (A2C):   46.9 ml 26.94 ml/m  RA Volume:   29.20 ml  16.77 ml/m  LA Vol (A4C):   52.6 ml 30.21 ml/m  LA Biplane Vol: 51.6 ml 29.64 ml/m   AORTIC VALVE                     PULMONIC VALVE  AV Area (Vmax):    0.43 cm      PV Vmax:       1.06 m/s  AV Area (Vmean):   0.40 cm      PV Peak grad:  4.5 mmHg  AV Area (VTI):     0.45 cm  AV Vmax:           387.00 cm/s  AV Vmean:          269.500 cm/s  AV VTI:            0.966 m  AV Peak Grad:      59.9 mmHg  AV Mean Grad:      33.5 mmHg  LVOT Vmax:         126.00 cm/s  LVOT Vmean:        82.100 cm/s  LVOT VTI:          0.327 m  LVOT/AV VTI ratio: 0.34  AI PHT:            748 msec  AR Vena Contracta: 0.60 cm    AORTA  Ao Root diam: 2.40 cm  Ao Arch diam: 2.4 cm   MITRAL VALVE  MV Area (PHT): 2.70 cm    SHUNTS  MV Decel Time: 281 msec    Systemic VTI:  0.33 m  MV E velocity: 65.10 cm/s  Systemic Diam: 1.30 cm  MV A velocity: 87.40 cm/s  MV E/A ratio:  0.74   Rozann Lesches MD  Electronically signed by Rozann Lesches MD  Signature Date/Time: 06/02/2022/7:25:55 PM        Final        Physicians   Panel Physicians Referring Physician Case Authorizing Physician  Sherren Mocha, MD (Primary)        Procedures   RIGHT HEART CATH AND CORONARY ANGIOGRAPHY    Conclusion   1.  Widely patent coronary arteries with mild nonobstructive plaquing in the mid LAD and otherwise normal coronary arteries 2.  Normal right heart  hemodynamics with RA pressure mean of 2 mmHg, PA pressure 25/7 with a mean of 17 mmHg, and wedge pressure of 7 mmHg.  Preserved cardiac output at 4.8 L/min and cardiac index of 2.8 L/min/m. 3.  Severe aortic stenosis by echo assessment   Recommendations: The patient appears to be very well compensated.  Continue evaluation for treatment of symptomatic aortic stenosis.   Indications   Severe aortic stenosis [I35.0 (ICD-10-CM)]    Procedural Details   Technical Details INDICATION: Aortic stenosis, preop study  PROCEDURAL DETAILS: The right antecubital fossa was prepped, draped, and anesthetized with 1% lidocaine.  Using ultrasound, an antecubital vein was accessed via  a front wall puncture and a 4/5 French sheath is inserted.  Ultrasound images are digitally captured and stored in the patient's chart.  The right wrist was then prepped, draped, and anesthetized with 1% lidocaine.  Using vascular ultrasound guidance, the right radial artery was accessed via a front wall puncture.  A 5/6 French Slender sheath was placed in the right radial artery. Intra-arterial verapamil was administered through the radial artery sheath. IV heparin was administered after a JR4 catheter was advanced into the central aorta. A Swan-Ganz catheter was used for the right heart catheterization. Standard protocol was followed for recording of right heart pressures and sampling of oxygen saturations. Fick cardiac output was calculated. Standard Judkins catheters were used for selective coronary angiography. There were no immediate procedural complications. The patient was transferred to the post catheterization recovery area for further monitoring.      Estimated blood loss <50 mL.   During this procedure medications were administered to achieve and maintain moderate conscious sedation while the patient's heart rate, blood pressure, and oxygen saturation were continuously monitored and I was present face-to-face 100% of this  time.    Medications (Filter: Administrations occurring from 1459 to 1605 on 06/02/22)  important  Continuous medications are totaled by the amount administered until 06/02/22 1605.    fentaNYL (SUBLIMAZE) injection (mcg)  Total dose: 25 mcg Date/Time Rate/Dose/Volume Action    06/02/22 1527 25 mcg Given    midazolam (VERSED) injection (mg)  Total dose: 2 mg Date/Time Rate/Dose/Volume Action    06/02/22 1528 2 mg Given    Radial Cocktail/Verapamil only (mL)  Total volume: 10 mL Date/Time Rate/Dose/Volume Action    06/02/22 1539 10 mL Given    Heparin (Porcine) in NaCl 1000-0.9 UT/500ML-% SOLN (mL)  Total volume: 1,000 mL Date/Time Rate/Dose/Volume Action    06/02/22 1555 500 mL Given    1557 500 mL Given    iohexol (OMNIPAQUE) 350 MG/ML injection (mL)  Total volume: 35 mL Date/Time Rate/Dose/Volume Action    06/02/22 1559 35 mL Given    lidocaine (PF) (XYLOCAINE) 1 % injection (mL)  Total volume: 5 mL Date/Time Rate/Dose/Volume Action    06/02/22 1533 5 mL Given    heparin sodium (porcine) injection (Units)  Total dose: 4,000 Units Date/Time Rate/Dose/Volume Action    06/02/22 1548 4,000 Units Given    acetaminophen (TYLENOL) tablet 650 mg (mg)  Total dose: Cannot be calculated* Dosing weight: 74.4 *Administration dose not documented Date/Time Rate/Dose/Volume Action    06/02/22 1459 *Not included in total MAR Hold    atorvastatin (LIPITOR) tablet 10 mg (mg)  Total dose: Cannot be calculated* Dosing weight: 74.4 *Administration dose not documented Date/Time Rate/Dose/Volume Action    06/02/22 1459 *Not included in total MAR Hold    baclofen (LIORESAL) tablet 10 mg (mg)  Total dose: Cannot be calculated* Dosing weight: 74.4 *Administration dose not documented Date/Time Rate/Dose/Volume Action    06/02/22 1459 *Not included in total MAR Hold    [id number] *0 mg Duplicate    clonazePAM (KLONOPIN) tablet 0.5 mg (mg)  Total dose: Cannot be calculated* *Administration dose  not documented Date/Time Rate/Dose/Volume Action    06/02/22 1459 *Not included in total MAR Hold    fentaNYL (SUBLIMAZE) injection 25 mcg (mcg)  Total dose: Cannot be calculated* Dosing weight: 74.4 *Administration dose not documented Date/Time Rate/Dose/Volume Action    06/02/22 1459 *Not included in total MAR Hold    fluticasone furoate-vilanterol (BREO ELLIPTA) 100-25 MCG/ACT 1 puff (puff)  Total dose: Cannot  be calculated* *Administration dose not documented Date/Time Rate/Dose/Volume Action    06/02/22 1459 *Not included in total MAR Hold    heparin injection 5,000 Units (Units)  Total dose: Cannot be calculated* Dosing weight: 74.4 *Administration dose not documented Date/Time Rate/Dose/Volume Action    06/02/22 1459 *Not included in total MAR Hold    levothyroxine (SYNTHROID) tablet 100 mcg (mcg)  Total dose: Cannot be calculated* Dosing weight: 74.4 *Administration dose not documented Date/Time Rate/Dose/Volume Action    06/02/22 1459 *Not included in total MAR Hold    ondansetron (ZOFRAN) injection 4 mg (mg)  Total dose: Cannot be calculated* Dosing weight: 74.4 *Administration dose not documented Date/Time Rate/Dose/Volume Action    06/02/22 1459 *Not included in total MAR Hold    pregabalin (LYRICA) capsule 75 mg (mg)  Total dose: Cannot be calculated* Dosing weight: 74.4 *Administration dose not documented Date/Time Rate/Dose/Volume Action    06/02/22 1459 *Not included in total MAR Hold    rOPINIRole (REQUIP) tablet 3 mg (mg)  Total dose: Cannot be calculated* Dosing weight: 74.4 *Administration dose not documented Date/Time Rate/Dose/Volume Action    06/02/22 1459 *Not included in total MAR Hold      Sedation Time   Sedation Time Physician-1: 25 minutes 6 seconds Contrast   Medication Name Total Dose  iohexol (OMNIPAQUE) 350 MG/ML injection 35 mL    Radiation/Fluoro   Fluoro time: 3.6 (min) DAP: 5.1 (Gycm2) Cumulative Air Kerma: [id number]  (mGy) Complications   Complications documented before study signed (06/02/2022  [id number] PM)    No complications were associated with this study.  Documented by Bethann Punches, RN - 06/02/2022  4:07 PM      Coronary Findings   Diagnostic Dominance: Right Left Main  Vessel is angiographically normal.    Left Anterior Descending  There is mild diffuse disease throughout the vessel. There is mild plaquing in the mid LAD with no obstructive disease noted. There is no stenosis greater than 30% identified.    Ramus Intermedius  Vessel is small. Vessel is angiographically normal.    Left Circumflex  The vessel exhibits minimal luminal irregularities. The circumflex distribution is patent with no significant stenosis. There is a single OM branch with no stenosis.    First Obtuse Marginal Branch  The vessel exhibits minimal luminal irregularities.    Right Coronary Artery  Vessel is large. The vessel exhibits minimal luminal irregularities. Large, dominant RCA. The PDA is large and has no stenosis. The PLA branches are small with no significant stenosis.    Intervention    No interventions have been documented.    Coronary Diagrams   Diagnostic Dominance: Right  Intervention    Implants    No implant documentation for this case.    Syngo Images    Show images for CARDIAC CATHETERIZATION Images on Long Term Storage    Show images for Keiko, Noreen "[name]" Link to Procedure Log   Procedure Log    Hemo Data   Flowsheet Row Most Recent Value  Fick Cardiac Output 4.82 L/min  Fick Cardiac Output Index 2.77 (L/min)/BSA  RA A Wave 6 mmHg  RA V Wave 1 mmHg  RA Mean 2 mmHg  RV Systolic Pressure 23 mmHg  RV Diastolic Pressure 2 mmHg  RV EDP 4 mmHg  PA Systolic Pressure 25 mmHg  PA Diastolic Pressure 7 mmHg  PA Mean 17 mmHg  PW A Wave 8 mmHg  PW V Wave 8 mmHg  PW Mean 7 mmHg  AO Systolic Pressure  [id number] mmHg  AO Diastolic Pressure 62 mmHg  AO Mean 85 mmHg  QP/QS 1   TPVR Index 6.15 HRUI  TSVR Index 30.73 HRUI  PVR SVR Ratio 0.12  TPVR/TSVR Ratio 0.2    Impression:   ADDENDUM REPORT: 06/07/2022 15:51   CLINICAL DATA:  Aortic valve replacement (TAVR), pre-op eval   EXAM: Cardiac TAVR CT   TECHNIQUE: The patient was scanned on a Siemens Force [id number] slice scanner. A 120 kV retrospective scan was triggered in the descending thoracic aorta at 111 HU's. Gantry rotation speed was 270 msecs and collimation was .9 mm. The 3D data set was reconstructed in 5% intervals of the R-R cycle. Systolic and diastolic phases were analyzed on a dedicated work station using MPR, MIP and VRT modes. The patient received 152m OMNIPAQUE IOHEXOL 350 MG/ML SOLN of contrast.   FINDINGS: Aortic Valve:   Tricuspid aortic valve with moderately reduced cusp excursion. Severely thickened and mild-moderately calcified aortic valve cusps.   AV calcium score: 319   Virtual Basal Annulus Measurements:   Maximum/Minimum Diameter: 24.5 x 19.3 mm   Perimeter: 69.7 mm   Area:  375 mm2   Trivial LVOT calcifications.   Membranous septal length: 4 mm   Based on these measurements, the annulus would be suitable for a 23 mm Sapien 3 valve. Alternatively, Heart Team can consider 26 mm Evolut valve however sinus measurements are borderline for this valve. Recommend Heart Team discussion for valve selection.   Sinus of Valsalva Measurements:   Non-coronary:  27 mm   Right - coronary:  25 mm   Left - coronary:  27 mm   Sinus of Valsalva Height:   Left: 18.3 mm   Right: 20.1 mm   Aorta: Conventional 3 vessel branch pattern of aortic arch. Aortic atherosclerosis.   Sinotubular Junction:  25 mm   Ascending Thoracic Aorta:  38 mm, mild dilation for age and BSA.   Aortic Arch:  26 mm   Descending Thoracic Aorta:  27 mm   Coronary Artery Height above Annulus:   Left main: 12.8 mm   Right coronary: 16.3 mm   Coronary Arteries: Normal coronary origin. Right  dominance. The study was performed without use of NTG and insufficient for plaque evaluation. Coronary artery calcium score is 0.   Optimum Fluoroscopic Angle for Delivery: LAO 7 CAU 8   OTHER:   Atria: Mild left atrial enlargement.   Left atrial appendage: No thrombus.   Mitral valve: Grossly normal, no mitral annular calcifications.   Pulmonary artery: Normal caliber.   Pulmonary veins: Normal anatomy.   IMPRESSION: 1. Tricuspid aortic valve with moderately reduced cusp excursion. Very severely thickened and mild-moderately calcified aortic valve cusps. 2. Aortic valve calcium score: 319 3. Annulus area: 375 mm2, suitable for 23 mm Sapien 3 valve. Trivial LVOT calcifications. Membranous septal length 4 mm. 4. Sufficient coronary artery heights from annulus. 5. Optimum fluoroscopic angle for delivery: LAO 7 CAU 8 6. Mild dilation of ascending thoracic aorta at mid level, 38 mm.     Electronically Signed   By: GCherlynn KaiserM.D.   On: 06/07/2022 15:51    Addended by AElouise Munroe MD on 06/07/2022  3:53 PM    Study Result   Narrative & Impression  EXAM: OVER-READ INTERPRETATION  CT CHEST   The following report is a limited chest CT over-read performed by radiologist Dr. DVinnie Langtonof [facility] [place] 06/03/2022. This over-read does not include interpretation of cardiac or  coronary anatomy or pathology. The coronary calcium score and cardiac CTA interpretation by the cardiologist is attached.   COMPARISON:  Chest CT 01/05/2018.   FINDINGS: Extracardiac findings will be described separately under dictation for contemporaneously obtained CTA chest, abdomen and pelvis.   IMPRESSION: Please see separate dictation for contemporaneously obtained CTA chest, abdomen and pelvis dated 06/03/2022 for full description of relevant extracardiac findings.   Electronically Signed: By: Vinnie Langton M.D. On: 06/03/2022 13:55          Narrative &  Impression  CLINICAL DATA:  72 year old female with history of severe aortic stenosis. Preprocedural study prior to potential transcatheter aortic valve replacement (TAVR) procedure.   EXAM: CT ANGIOGRAPHY CHEST, ABDOMEN AND PELVIS   TECHNIQUE: Multidetector CT imaging through the chest, abdomen and pelvis was performed using the standard protocol during bolus administration of intravenous contrast. Multiplanar reconstructed images and MIPs were obtained and reviewed to evaluate the vascular anatomy.   RADIATION DOSE REDUCTION: This exam was performed according to the departmental dose-optimization program which includes automated exposure control, adjustment of the mA and/or kV according to patient size and/or use of iterative reconstruction technique.   CONTRAST:  145m OMNIPAQUE IOHEXOL 350 MG/ML SOLN   COMPARISON:  None Available.   FINDINGS: CTA CHEST FINDINGS   Cardiovascular: Heart size is normal. There is no significant pericardial fluid, thickening or pericardial calcification. Aortic atherosclerosis. No definite coronary artery calcifications. Thickening and calcification of the aortic valve.   Mediastinum/Lymph Nodes: No pathologically enlarged mediastinal or hilar lymph nodes. Esophagus is unremarkable in appearance. No axillary lymphadenopathy.   Lungs/Pleura: Scattered areas of linear architectural distortion in the lungs bilaterally (left-greater-than-right), most compatible with areas of chronic post infectious or inflammatory scarring. Multiple calcified pleural plaques in the left hemithorax are noted. No definite calcified right-sided pleural plaques. No acute consolidative airspace disease. No pleural effusions. A few scattered small pulmonary nodules are noted, largest of which is in the periphery of the right upper lobe (axial image 36 of series 5) measuring 4 mm. No larger more suspicious appearing pulmonary nodules or masses are noted.    Musculoskeletal/Soft Tissues: There are no aggressive appearing lytic or blastic lesions noted in the visualized portions of the skeleton.   CTA ABDOMEN AND PELVIS FINDINGS   Hepatobiliary: 8 mm low-attenuation lesion in the central aspect of segment 4A of the liver, too small to definitively characterize, but statistically likely tiny cysts (no imaging follow-up recommended). No other suspicious hepatic lesions are noted. No intra or extrahepatic biliary ductal dilatation. Gallbladder is normal in appearance.   Pancreas: There are multiple low-attenuation regions noted throughout the pancreas, most evident in the body and tail, likely to represent extensive side branch ectasia and/or small cystic lesions, poorly evaluated on today's CT examination. No discrete solid appearing mass is identified. Diffuse dilatation of the pancreatic duct measuring up to 6 mm in the neck of the pancreas is noted. No peripancreatic fluid collections or inflammatory changes are noted.   Spleen: Unremarkable.   Adrenals/Urinary Tract: Bilateral kidneys and bilateral adrenal glands are normal in appearance. No hydroureteronephrosis. Urinary bladder is normal in appearance.   Stomach/Bowel: The appearance of the stomach is normal. No pathologic dilatation of small bowel or colon. Normal appendix.   Vascular/Lymphatic: Atherosclerosis in the abdominal aorta and pelvic vasculature. No lymphadenopathy noted in the abdomen or pelvis.   Reproductive: Uterus and ovaries are unremarkable in appearance.   Other: No significant volume of ascites.  No pneumoperitoneum.   Musculoskeletal: There  are no aggressive appearing lytic or blastic lesions noted in the visualized portions of the skeleton.   VASCULAR MEASUREMENTS PERTINENT TO TAVR:   AORTA:   Minimal Aortic Diameter-13 x 13 mm   Severity of Aortic Calcification-mild-to-moderate   RIGHT PELVIS:   Right Common Iliac Artery -   Minimal  Diameter-7.1 x 8.6 mm   Tortuosity-mild   Calcification-mild   Right External Iliac Artery -   Minimal Diameter-5.3 x 5.2 mm   Tortuosity-mild   Calcification-mild   Right Common Femoral Artery -   Minimal Diameter-5.8 x 5.7 mm   Tortuosity-mild   Calcification-mild   LEFT PELVIS:   Left Common Iliac Artery -   Minimal Diameter-8.8 x 7.3 mm   Tortuosity-mild   Calcification-mild   Left External Iliac Artery -   Minimal Diameter-5.3 x 4.5 mm   Tortuosity-mild   Calcification-mild   Left Common Femoral Artery -   Minimal Diameter-5.8 x 5.7 mm   Tortuosity-mild   Calcification-mild   Review of the MIP images confirms the above findings.   IMPRESSION: 1. Vascular findings and measurements pertinent to potential TAVR procedure, as detailed above. 2. Severe thickening and calcification of the aortic valve, compatible with reported clinical history of severe aortic stenosis. 3. Diffuse dilatation of the main pancreatic duct with hypovascular areas throughout the body and tail of the pancreas. This is poorly evaluated on today's arterial phase examination, but favored to represent diffuse side branch ectasia along with multiple cystic lesions throughout the body and tail of the pancreas, however underlying hypovascular mass or masses in these regions is not excluded. Follow-up nonemergent outpatient abdominal MRI with and without IV gadolinium with MRCP is recommended in the near future to better evaluate these findings and exclude underlying neoplasm. 4. Small pulmonary nodules measuring 4 mm or less in size, nonspecific, but statistically likely benign. No follow-up needed if patient is low-risk (and has no known or suspected primary neoplasm). Non-contrast chest CT can be considered in 12 months if patient is high-risk. This recommendation follows the consensus statement: Guidelines for Management of Incidental Pulmonary Nodules Detected on CT Images:  From the Fleischner Society 2017; Radiology 2017; 284:228-243. 5. Multiple calcified left-sided pleural plaques, presumably sequela of prior left pleural infection or hemorrhage. 6. Additional incidental findings, as above.     Electronically Signed   By: Vinnie Langton M.D.   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  She has also developed episodes of substernal chest discomfort and dizziness.  I have personally reviewed her 2D echocardiogram, cardiac catheterization, and CTA studies.  Her 2D echo shows a trileaflet aortic valve with marked thickening of the valve leaflets with mild calcification.  The mean gradient was measured at 33.5 mmHg with a peak gradient of 60 mmHg and a calculated valve area of 0.45 cm.  Cardiac catheterization showed widely patent coronary arteries with normal right heart pressures.  I agree that aortic valve replacement is indicated in this patient for relief of her symptoms and to prevent left ventricular dysfunction.  Her gated cardiac CTA shows markedly thickened aortic valve leaflets with surprisingly little calcification.  I think that given her younger age, good functional status, and minimally calcified aortic valve leaflets that she would be best treated with open  surgical aortic valve replacement using a bioprosthetic valve.  I reviewed her echo and CT images with her and answered her questions. I discussed the operative procedure with the patient and family including alternatives, benefits and risks; including but not limited to bleeding, blood transfusion, infection, stroke, myocardial infarction, graft failure, heart block requiring a permanent pacemaker, organ dysfunction, and death.  Marjoria Miyashiro understands and agrees to proceed.  Plan AVR using a bioprosthetic valve.    [name], MD

## 2022-06-16 ENCOUNTER — Encounter (HOSPITAL_COMMUNITY): Payer: Self-pay | Admitting: Surgery

## 2022-06-16 ENCOUNTER — Encounter (HOSPITAL_COMMUNITY)
Admission: RE | Disposition: A | Discharge status: Home Health | Payer: Self-pay | Source: Home / Self Care | Attending: Surgery

## 2022-06-16 ENCOUNTER — Inpatient Hospital Stay (HOSPITAL_COMMUNITY): Payer: No Typology Code available for payment source | Admitting: Certified Registered"

## 2022-06-16 ENCOUNTER — Inpatient Hospital Stay (HOSPITAL_COMMUNITY)
Admission: RE | Admit: 2022-06-16 | Discharge: 2022-06-24 | DRG: 220 | Disposition: A | Discharge status: Home Health | Payer: No Typology Code available for payment source | Attending: Surgery | Admitting: Surgery

## 2022-06-16 ENCOUNTER — Inpatient Hospital Stay (HOSPITAL_COMMUNITY): Payer: No Typology Code available for payment source

## 2022-06-16 ENCOUNTER — Other Ambulatory Visit: Payer: Self-pay

## 2022-06-16 DIAGNOSIS — Z8541 Personal history of malignant neoplasm of cervix uteri: Secondary | ICD-10-CM | POA: Diagnosis not present

## 2022-06-16 DIAGNOSIS — J441 Chronic obstructive pulmonary disease with (acute) exacerbation: Secondary | ICD-10-CM | POA: Diagnosis present

## 2022-06-16 DIAGNOSIS — Z86008 Personal history of in-situ neoplasm of other site: Secondary | ICD-10-CM

## 2022-06-16 DIAGNOSIS — E871 Hypo-osmolality and hyponatremia: Secondary | ICD-10-CM | POA: Diagnosis present

## 2022-06-16 DIAGNOSIS — F17210 Nicotine dependence, cigarettes, uncomplicated: Secondary | ICD-10-CM

## 2022-06-16 DIAGNOSIS — F32A Depression, unspecified: Secondary | ICD-10-CM | POA: Diagnosis present

## 2022-06-16 DIAGNOSIS — R918 Other nonspecific abnormal finding of lung field: Secondary | ICD-10-CM | POA: Diagnosis not present

## 2022-06-16 DIAGNOSIS — I9581 Postprocedural hypotension: Secondary | ICD-10-CM | POA: Diagnosis not present

## 2022-06-16 DIAGNOSIS — Z7982 Long term (current) use of aspirin: Secondary | ICD-10-CM

## 2022-06-16 DIAGNOSIS — I358 Other nonrheumatic aortic valve disorders: Secondary | ICD-10-CM | POA: Diagnosis present

## 2022-06-16 DIAGNOSIS — Z85820 Personal history of malignant melanoma of skin: Secondary | ICD-10-CM | POA: Diagnosis not present

## 2022-06-16 DIAGNOSIS — Z85828 Personal history of other malignant neoplasm of skin: Secondary | ICD-10-CM

## 2022-06-16 DIAGNOSIS — J9811 Atelectasis: Secondary | ICD-10-CM | POA: Diagnosis not present

## 2022-06-16 DIAGNOSIS — Z8709 Personal history of other diseases of the respiratory system: Secondary | ICD-10-CM

## 2022-06-16 DIAGNOSIS — E785 Hyperlipidemia, unspecified: Secondary | ICD-10-CM | POA: Diagnosis present

## 2022-06-16 DIAGNOSIS — Z86006 Personal history of melanoma in-situ: Secondary | ICD-10-CM

## 2022-06-16 DIAGNOSIS — J449 Chronic obstructive pulmonary disease, unspecified: Secondary | ICD-10-CM

## 2022-06-16 DIAGNOSIS — F17200 Nicotine dependence, unspecified, uncomplicated: Secondary | ICD-10-CM | POA: Diagnosis not present

## 2022-06-16 DIAGNOSIS — Z79890 Hormone replacement therapy (postmenopausal): Secondary | ICD-10-CM | POA: Diagnosis not present

## 2022-06-16 DIAGNOSIS — E039 Hypothyroidism, unspecified: Secondary | ICD-10-CM | POA: Diagnosis present

## 2022-06-16 DIAGNOSIS — F418 Other specified anxiety disorders: Secondary | ICD-10-CM | POA: Diagnosis not present

## 2022-06-16 DIAGNOSIS — R600 Localized edema: Secondary | ICD-10-CM | POA: Diagnosis present

## 2022-06-16 DIAGNOSIS — I350 Nonrheumatic aortic (valve) stenosis: Secondary | ICD-10-CM | POA: Diagnosis not present

## 2022-06-16 DIAGNOSIS — Z79899 Other long term (current) drug therapy: Secondary | ICD-10-CM

## 2022-06-16 DIAGNOSIS — G2581 Restless legs syndrome: Secondary | ICD-10-CM | POA: Diagnosis present

## 2022-06-16 DIAGNOSIS — Z952 Presence of prosthetic heart valve: Principal | ICD-10-CM

## 2022-06-16 DIAGNOSIS — I352 Nonrheumatic aortic (valve) stenosis with insufficiency: Secondary | ICD-10-CM

## 2022-06-16 DIAGNOSIS — J90 Pleural effusion, not elsewhere classified: Secondary | ICD-10-CM | POA: Diagnosis not present

## 2022-06-16 DIAGNOSIS — Z7951 Long term (current) use of inhaled steroids: Secondary | ICD-10-CM

## 2022-06-16 DIAGNOSIS — I200 Unstable angina: Secondary | ICD-10-CM | POA: Diagnosis present

## 2022-06-16 DIAGNOSIS — Z006 Encounter for examination for normal comparison and control in clinical research program: Secondary | ICD-10-CM

## 2022-06-16 DIAGNOSIS — I4891 Unspecified atrial fibrillation: Secondary | ICD-10-CM | POA: Diagnosis not present

## 2022-06-16 DIAGNOSIS — Z8249 Family history of ischemic heart disease and other diseases of the circulatory system: Secondary | ICD-10-CM

## 2022-06-16 DIAGNOSIS — D72829 Elevated white blood cell count, unspecified: Secondary | ICD-10-CM | POA: Diagnosis not present

## 2022-06-16 DIAGNOSIS — F419 Anxiety disorder, unspecified: Secondary | ICD-10-CM | POA: Diagnosis present

## 2022-06-16 HISTORY — PX: TEE WITHOUT CARDIOVERSION: SHX5443

## 2022-06-16 HISTORY — PX: AORTIC VALVE REPLACEMENT: SHX41

## 2022-06-16 LAB — CBC
HCT: 32.4 % — ABNORMAL LOW (ref 36.0–46.0)
HCT: 32.1 % — ABNORMAL LOW (ref 36.0–46.0)
Hemoglobin: 11 g/dL — ABNORMAL LOW (ref 12.0–15.0)
Hemoglobin: 10.8 g/dL — ABNORMAL LOW (ref 12.0–15.0)
MCH: 30.4 pg (ref 26.0–34.0)
MCH: 30.1 pg (ref 26.0–34.0)
MCHC: 34 g/dL (ref 30.0–36.0)
MCHC: 33.6 g/dL (ref 30.0–36.0)
MCV: 89.5 fL (ref 80.0–100.0)
MCV: 89.4 fL (ref 80.0–100.0)
Platelets: 116 10*3/uL — ABNORMAL LOW (ref 150–400)
Platelets: 131 10*3/uL — ABNORMAL LOW (ref 150–400)
RBC: 3.62 MIL/uL — ABNORMAL LOW (ref 3.87–5.11)
RBC: 3.59 MIL/uL — ABNORMAL LOW (ref 3.87–5.11)
RDW: 13.9 % (ref 11.5–15.5)
RDW: 13.9 % (ref 11.5–15.5)
WBC: 10.7 10*3/uL — ABNORMAL HIGH (ref 4.0–10.5)
WBC: 9.8 10*3/uL (ref 4.0–10.5)
nRBC: 0 % (ref 0.0–0.2)
nRBC: 0 % (ref 0.0–0.2)

## 2022-06-16 LAB — POCT I-STAT 7, (LYTES, BLD GAS, ICA,H+H)
Acid-Base Excess: 3 mmol/L — ABNORMAL HIGH (ref 0.0–2.0)
Acid-Base Excess: 0 mmol/L (ref 0.0–2.0)
Acid-base deficit: 3 mmol/L — ABNORMAL HIGH (ref 0.0–2.0)
Acid-base deficit: 4 mmol/L — ABNORMAL HIGH (ref 0.0–2.0)
Acid-base deficit: 1 mmol/L (ref 0.0–2.0)
Acid-base deficit: 1 mmol/L (ref 0.0–2.0)
Acid-base deficit: 3 mmol/L — ABNORMAL HIGH (ref 0.0–2.0)
Acid-base deficit: 3 mmol/L — ABNORMAL HIGH (ref 0.0–2.0)
Acid-base deficit: 2 mmol/L (ref 0.0–2.0)
Bicarbonate: 24 mmol/L (ref 20.0–28.0)
Bicarbonate: 21.9 mmol/L (ref 20.0–28.0)
Bicarbonate: 28.2 mmol/L — ABNORMAL HIGH (ref 20.0–28.0)
Bicarbonate: 22.9 mmol/L (ref 20.0–28.0)
Bicarbonate: 25.1 mmol/L (ref 20.0–28.0)
Bicarbonate: 23.4 mmol/L (ref 20.0–28.0)
Bicarbonate: 23.8 mmol/L (ref 20.0–28.0)
Bicarbonate: 26.3 mmol/L (ref 20.0–28.0)
Bicarbonate: 24.6 mmol/L (ref 20.0–28.0)
Calcium, Ion: 1.3 mmol/L (ref 1.15–1.40)
Calcium, Ion: 1.05 mmol/L — ABNORMAL LOW (ref 1.15–1.40)
Calcium, Ion: 1.13 mmol/L — ABNORMAL LOW (ref 1.15–1.40)
Calcium, Ion: 1.16 mmol/L (ref 1.15–1.40)
Calcium, Ion: 1.18 mmol/L (ref 1.15–1.40)
Calcium, Ion: 1.23 mmol/L (ref 1.15–1.40)
Calcium, Ion: 1.22 mmol/L (ref 1.15–1.40)
Calcium, Ion: 1.2 mmol/L (ref 1.15–1.40)
Calcium, Ion: 1.21 mmol/L (ref 1.15–1.40)
HCT: 35 % — ABNORMAL LOW (ref 36.0–46.0)
HCT: 26 % — ABNORMAL LOW (ref 36.0–46.0)
HCT: 26 % — ABNORMAL LOW (ref 36.0–46.0)
HCT: 27 % — ABNORMAL LOW (ref 36.0–46.0)
HCT: 29 % — ABNORMAL LOW (ref 36.0–46.0)
HCT: 31 % — ABNORMAL LOW (ref 36.0–46.0)
HCT: 30 % — ABNORMAL LOW (ref 36.0–46.0)
HCT: 30 % — ABNORMAL LOW (ref 36.0–46.0)
HCT: 31 % — ABNORMAL LOW (ref 36.0–46.0)
Hemoglobin: 11.9 g/dL — ABNORMAL LOW (ref 12.0–15.0)
Hemoglobin: 8.8 g/dL — ABNORMAL LOW (ref 12.0–15.0)
Hemoglobin: 8.8 g/dL — ABNORMAL LOW (ref 12.0–15.0)
Hemoglobin: 9.2 g/dL — ABNORMAL LOW (ref 12.0–15.0)
Hemoglobin: 9.9 g/dL — ABNORMAL LOW (ref 12.0–15.0)
Hemoglobin: 10.5 g/dL — ABNORMAL LOW (ref 12.0–15.0)
Hemoglobin: 10.2 g/dL — ABNORMAL LOW (ref 12.0–15.0)
Hemoglobin: 10.2 g/dL — ABNORMAL LOW (ref 12.0–15.0)
Hemoglobin: 10.5 g/dL — ABNORMAL LOW (ref 12.0–15.0)
O2 Saturation: 100 %
O2 Saturation: 100 %
O2 Saturation: 100 %
O2 Saturation: 100 %
O2 Saturation: 99 %
O2 Saturation: 93 %
O2 Saturation: 96 %
O2 Saturation: 96 %
O2 Saturation: 92 %
Patient temperature: 35.8
Patient temperature: 37.2
Patient temperature: 37.7
Patient temperature: 37.6
Potassium: 3.9 mmol/L (ref 3.5–5.1)
Potassium: 4.3 mmol/L (ref 3.5–5.1)
Potassium: 4.9 mmol/L (ref 3.5–5.1)
Potassium: 5.3 mmol/L — ABNORMAL HIGH (ref 3.5–5.1)
Potassium: 4.6 mmol/L (ref 3.5–5.1)
Potassium: 4.3 mmol/L (ref 3.5–5.1)
Potassium: 4.3 mmol/L (ref 3.5–5.1)
Potassium: 4.2 mmol/L (ref 3.5–5.1)
Potassium: 4.2 mmol/L (ref 3.5–5.1)
Sodium: 138 mmol/L (ref 135–145)
Sodium: 139 mmol/L (ref 135–145)
Sodium: 141 mmol/L (ref 135–145)
Sodium: 138 mmol/L (ref 135–145)
Sodium: 139 mmol/L (ref 135–145)
Sodium: 139 mmol/L (ref 135–145)
Sodium: 140 mmol/L (ref 135–145)
Sodium: 141 mmol/L (ref 135–145)
Sodium: 140 mmol/L (ref 135–145)
TCO2: 25 mmol/L (ref 22–32)
TCO2: 23 mmol/L (ref 22–32)
TCO2: 30 mmol/L (ref 22–32)
TCO2: 24 mmol/L (ref 22–32)
TCO2: 26 mmol/L (ref 22–32)
TCO2: 25 mmol/L (ref 22–32)
TCO2: 25 mmol/L (ref 22–32)
TCO2: 28 mmol/L (ref 22–32)
TCO2: 26 mmol/L (ref 22–32)
pCO2 arterial: 48.8 mmHg — ABNORMAL HIGH (ref 32–48)
pCO2 arterial: 44.9 mmHg (ref 32–48)
pCO2 arterial: 46.3 mmHg (ref 32–48)
pCO2 arterial: 34 mmHg (ref 32–48)
pCO2 arterial: 45.5 mmHg (ref 32–48)
pCO2 arterial: 46.4 mmHg (ref 32–48)
pCO2 arterial: 48.5 mmHg — ABNORMAL HIGH (ref 32–48)
pCO2 arterial: 52.2 mmHg — ABNORMAL HIGH (ref 32–48)
pCO2 arterial: 48.3 mmHg — ABNORMAL HIGH (ref 32–48)
pH, Arterial: 7.3 — ABNORMAL LOW (ref 7.35–7.45)
pH, Arterial: 7.296 — ABNORMAL LOW (ref 7.35–7.45)
pH, Arterial: 7.393 (ref 7.35–7.45)
pH, Arterial: 7.437 (ref 7.35–7.45)
pH, Arterial: 7.349 — ABNORMAL LOW (ref 7.35–7.45)
pH, Arterial: 7.305 — ABNORMAL LOW (ref 7.35–7.45)
pH, Arterial: 7.299 — ABNORMAL LOW (ref 7.35–7.45)
pH, Arterial: 7.314 — ABNORMAL LOW (ref 7.35–7.45)
pH, Arterial: 7.317 — ABNORMAL LOW (ref 7.35–7.45)
pO2, Arterial: 364 mmHg — ABNORMAL HIGH (ref 83–108)
pO2, Arterial: 357 mmHg — ABNORMAL HIGH (ref 83–108)
pO2, Arterial: 442 mmHg — ABNORMAL HIGH (ref 83–108)
pO2, Arterial: 324 mmHg — ABNORMAL HIGH (ref 83–108)
pO2, Arterial: 134 mmHg — ABNORMAL HIGH (ref 83–108)
pO2, Arterial: 70 mmHg — ABNORMAL LOW (ref 83–108)
pO2, Arterial: 93 mmHg (ref 83–108)
pO2, Arterial: 92 mmHg (ref 83–108)
pO2, Arterial: 74 mmHg — ABNORMAL LOW (ref 83–108)

## 2022-06-16 LAB — GLUCOSE, CAPILLARY
Glucose-Capillary: 123 mg/dL — ABNORMAL HIGH (ref 70–99)
Glucose-Capillary: 128 mg/dL — ABNORMAL HIGH (ref 70–99)
Glucose-Capillary: 127 mg/dL — ABNORMAL HIGH (ref 70–99)
Glucose-Capillary: 102 mg/dL — ABNORMAL HIGH (ref 70–99)
Glucose-Capillary: 105 mg/dL — ABNORMAL HIGH (ref 70–99)
Glucose-Capillary: 95 mg/dL (ref 70–99)
Glucose-Capillary: 116 mg/dL — ABNORMAL HIGH (ref 70–99)
Glucose-Capillary: 122 mg/dL — ABNORMAL HIGH (ref 70–99)
Glucose-Capillary: 81 mg/dL (ref 70–99)
Glucose-Capillary: 141 mg/dL — ABNORMAL HIGH (ref 70–99)
Glucose-Capillary: 128 mg/dL — ABNORMAL HIGH (ref 70–99)
Glucose-Capillary: 110 mg/dL — ABNORMAL HIGH (ref 70–99)

## 2022-06-16 LAB — POCT I-STAT, CHEM 8
BUN: 14 mg/dL (ref 8–23)
BUN: 13 mg/dL (ref 8–23)
BUN: 13 mg/dL (ref 8–23)
BUN: 12 mg/dL (ref 8–23)
Calcium, Ion: 1.27 mmol/L (ref 1.15–1.40)
Calcium, Ion: 1.27 mmol/L (ref 1.15–1.40)
Calcium, Ion: 1.18 mmol/L (ref 1.15–1.40)
Calcium, Ion: 1.15 mmol/L (ref 1.15–1.40)
Chloride: 104 mmol/L (ref 98–111)
Chloride: 105 mmol/L (ref 98–111)
Chloride: 105 mmol/L (ref 98–111)
Chloride: 104 mmol/L (ref 98–111)
Creatinine, Ser: 0.6 mg/dL (ref 0.44–1.00)
Creatinine, Ser: 0.5 mg/dL (ref 0.44–1.00)
Creatinine, Ser: 0.5 mg/dL (ref 0.44–1.00)
Creatinine, Ser: 0.5 mg/dL (ref 0.44–1.00)
Glucose, Bld: 109 mg/dL — ABNORMAL HIGH (ref 70–99)
Glucose, Bld: 115 mg/dL — ABNORMAL HIGH (ref 70–99)
Glucose, Bld: 121 mg/dL — ABNORMAL HIGH (ref 70–99)
Glucose, Bld: 103 mg/dL — ABNORMAL HIGH (ref 70–99)
HCT: 34 % — ABNORMAL LOW (ref 36.0–46.0)
HCT: 32 % — ABNORMAL LOW (ref 36.0–46.0)
HCT: 28 % — ABNORMAL LOW (ref 36.0–46.0)
HCT: 31 % — ABNORMAL LOW (ref 36.0–46.0)
Hemoglobin: 11.6 g/dL — ABNORMAL LOW (ref 12.0–15.0)
Hemoglobin: 10.9 g/dL — ABNORMAL LOW (ref 12.0–15.0)
Hemoglobin: 9.5 g/dL — ABNORMAL LOW (ref 12.0–15.0)
Hemoglobin: 10.5 g/dL — ABNORMAL LOW (ref 12.0–15.0)
Potassium: 3.9 mmol/L (ref 3.5–5.1)
Potassium: 4 mmol/L (ref 3.5–5.1)
Potassium: 4.8 mmol/L (ref 3.5–5.1)
Potassium: 4.6 mmol/L (ref 3.5–5.1)
Sodium: 139 mmol/L (ref 135–145)
Sodium: 140 mmol/L (ref 135–145)
Sodium: 140 mmol/L (ref 135–145)
Sodium: 140 mmol/L (ref 135–145)
TCO2: 24 mmol/L (ref 22–32)
TCO2: 25 mmol/L (ref 22–32)
TCO2: 26 mmol/L (ref 22–32)
TCO2: 23 mmol/L (ref 22–32)

## 2022-06-16 LAB — POCT I-STAT EG7
Acid-base deficit: 4 mmol/L — ABNORMAL HIGH (ref 0.0–2.0)
Bicarbonate: 22.7 mmol/L (ref 20.0–28.0)
Calcium, Ion: 1.11 mmol/L — ABNORMAL LOW (ref 1.15–1.40)
HCT: 27 % — ABNORMAL LOW (ref 36.0–46.0)
Hemoglobin: 9.2 g/dL — ABNORMAL LOW (ref 12.0–15.0)
O2 Saturation: 89 %
Potassium: 4.2 mmol/L (ref 3.5–5.1)
Sodium: 140 mmol/L (ref 135–145)
TCO2: 24 mmol/L (ref 22–32)
pCO2, Ven: 48.2 mmHg (ref 44–60)
pH, Ven: 7.281 (ref 7.25–7.43)
pO2, Ven: 63 mmHg — ABNORMAL HIGH (ref 32–45)

## 2022-06-16 LAB — PROTIME-INR
INR: 1.5 — ABNORMAL HIGH (ref 0.8–1.2)
Prothrombin Time: 17.8 seconds — ABNORMAL HIGH (ref 11.4–15.2)

## 2022-06-16 LAB — HEMOGLOBIN AND HEMATOCRIT, BLOOD
HCT: 28.8 % — ABNORMAL LOW (ref 36.0–46.0)
Hemoglobin: 9.9 g/dL — ABNORMAL LOW (ref 12.0–15.0)

## 2022-06-16 LAB — ECHO INTRAOPERATIVE TEE
AR max vel: 0.78 cm2
AV Area VTI: 0.81 cm2
AV Area mean vel: 0.82 cm2
AV Mean grad: 41 mmHg
AV Peak grad: 64.6 mmHg
AV Vena cont: 0.6 cm
Ao pk vel: 4.02 m/s
Area-P 1/2: 2.24 cm2
Height: 65 in
Weight: 2720 oz

## 2022-06-16 LAB — APTT: aPTT: 39 seconds — ABNORMAL HIGH (ref 24–36)

## 2022-06-16 LAB — ABO/RH: ABO/RH(D): O POS

## 2022-06-16 LAB — PLATELET COUNT: Platelets: 118 10*3/uL — ABNORMAL LOW (ref 150–400)

## 2022-06-16 LAB — PREPARE RBC (CROSSMATCH)

## 2022-06-16 SURGERY — REPLACEMENT, AORTIC VALVE, OPEN
Anesthesia: General | Site: Chest

## 2022-06-16 MED ORDER — VANCOMYCIN HCL IN DEXTROSE 1-5 GM/200ML-% IV SOLN
1000.0000 mg | Freq: Once | INTRAVENOUS | Status: AC
  Administered 2022-06-16: 1000 mg via INTRAVENOUS
  Filled 2022-06-16: qty 200

## 2022-06-16 MED ORDER — LEVOTHYROXINE SODIUM 100 MCG PO TABS
100.0000 ug | ORAL_TABLET | Freq: Every day | ORAL | Status: DC
  Administered 2022-06-17 – 2022-06-24 (×8): 100 ug via ORAL
  Filled 2022-06-16 (×8): qty 1

## 2022-06-16 MED ORDER — FLUTICASONE FUROATE-VILANTEROL 100-25 MCG/ACT IN AEPB
1.0000 | INHALATION_SPRAY | Freq: Every day | RESPIRATORY_TRACT | Status: DC
  Administered 2022-06-17 – 2022-06-22 (×5): 1 via RESPIRATORY_TRACT
  Filled 2022-06-16: qty 28

## 2022-06-16 MED ORDER — METOPROLOL TARTRATE 5 MG/5ML IV SOLN: 2.5000 mg | INTRAVENOUS | Status: DC | PRN

## 2022-06-16 MED ORDER — PLASMA-LYTE A IV SOLN
INTRAVENOUS | Status: DC | PRN
  Administered 2022-06-16: 10 mL via INTRAVASCULAR

## 2022-06-16 MED ORDER — HEPARIN SODIUM (PORCINE) 1000 UNIT/ML IJ SOLN
INTRAMUSCULAR | Status: DC | PRN
  Administered 2022-06-16: 27000 [IU] via INTRAVENOUS

## 2022-06-16 MED ORDER — FENTANYL CITRATE (PF) 250 MCG/5ML IJ SOLN
INTRAMUSCULAR | Status: AC
  Filled 2022-06-16: qty 5

## 2022-06-16 MED ORDER — HEMOSTATIC AGENTS (NO CHARGE) OPTIME
TOPICAL | Status: DC | PRN
  Administered 2022-06-16: 3 via TOPICAL

## 2022-06-16 MED ORDER — SODIUM CHLORIDE 0.45 % IV SOLN: INTRAVENOUS | Status: DC | PRN

## 2022-06-16 MED ORDER — LACTATED RINGERS IV SOLN: INTRAVENOUS | Status: DC

## 2022-06-16 MED ORDER — LACTATED RINGERS IV SOLN: INTRAVENOUS | Status: DC | PRN

## 2022-06-16 MED ORDER — CEFAZOLIN SODIUM-DEXTROSE 2-4 GM/100ML-% IV SOLN
2.0000 g | Freq: Three times a day (TID) | INTRAVENOUS | Status: AC
  Administered 2022-06-16 – 2022-06-18 (×6): 2 g via INTRAVENOUS
  Filled 2022-06-16 (×6): qty 100

## 2022-06-16 MED ORDER — HEPARIN SODIUM (PORCINE) 1000 UNIT/ML IJ SOLN
INTRAMUSCULAR | Status: AC
  Filled 2022-06-16: qty 1

## 2022-06-16 MED ORDER — BISACODYL 10 MG RE SUPP: 10.0000 mg | Freq: Every day | RECTAL | Status: DC

## 2022-06-16 MED ORDER — PHENYLEPHRINE HCL-NACL 20-0.9 MG/250ML-% IV SOLN
0.0000 ug/min | INTRAVENOUS | Status: DC
  Administered 2022-06-16: 5 ug/min via INTRAVENOUS
  Administered 2022-06-16: 30 ug/min via INTRAVENOUS
  Administered 2022-06-17: 40 ug/min via INTRAVENOUS
  Administered 2022-06-17: 50 ug/min via INTRAVENOUS
  Administered 2022-06-17: 30 ug/min via INTRAVENOUS
  Administered 2022-06-20: 20 ug/min via INTRAVENOUS
  Administered 2022-06-21: 15 ug/min via INTRAVENOUS
  Filled 2022-06-16 (×6): qty 250

## 2022-06-16 MED ORDER — ACETAMINOPHEN 160 MG/5ML PO SOLN: 1000.0000 mg | Freq: Four times a day (QID) | ORAL | Status: AC

## 2022-06-16 MED ORDER — PHENYLEPHRINE 80 MCG/ML (10ML) SYRINGE FOR IV PUSH (FOR BLOOD PRESSURE SUPPORT)
PREFILLED_SYRINGE | INTRAVENOUS | Status: AC
  Filled 2022-06-16: qty 10

## 2022-06-16 MED ORDER — SODIUM CHLORIDE 0.9 % IV SOLN: 250.0000 mL | INTRAVENOUS | Status: DC

## 2022-06-16 MED ORDER — ALBUMIN HUMAN 5 % IV SOLN: INTRAVENOUS | Status: DC | PRN

## 2022-06-16 MED ORDER — PHENYLEPHRINE HCL-NACL 20-0.9 MG/250ML-% IV SOLN
INTRAVENOUS | Status: AC
  Filled 2022-06-16: qty 250

## 2022-06-16 MED ORDER — ROCURONIUM BROMIDE 10 MG/ML (PF) SYRINGE
PREFILLED_SYRINGE | INTRAVENOUS | Status: AC
  Filled 2022-06-16: qty 10

## 2022-06-16 MED ORDER — MAGNESIUM SULFATE 4 GM/100ML IV SOLN
4.0000 g | Freq: Once | INTRAVENOUS | Status: AC
  Administered 2022-06-16: 4 g via INTRAVENOUS
  Filled 2022-06-16: qty 100

## 2022-06-16 MED ORDER — SODIUM CHLORIDE 0.9% IV SOLUTION: Freq: Once | INTRAVENOUS | Status: DC

## 2022-06-16 MED ORDER — ACETAMINOPHEN 500 MG PO TABS
1000.0000 mg | ORAL_TABLET | Freq: Four times a day (QID) | ORAL | Status: AC
  Administered 2022-06-16 – 2022-06-21 (×20): 1000 mg via ORAL
  Filled 2022-06-16 (×19): qty 2

## 2022-06-16 MED ORDER — ALBUTEROL SULFATE (2.5 MG/3ML) 0.083% IN NEBU: 2.5000 mg | INHALATION_SOLUTION | Freq: Four times a day (QID) | RESPIRATORY_TRACT | Status: DC | PRN

## 2022-06-16 MED ORDER — CHLORHEXIDINE GLUCONATE CLOTH 2 % EX PADS
6.0000 | MEDICATED_PAD | Freq: Every day | CUTANEOUS | Status: DC
  Administered 2022-06-16 – 2022-06-22 (×7): 6 via TOPICAL

## 2022-06-16 MED ORDER — ASPIRIN 81 MG PO CHEW: 324.0000 mg | CHEWABLE_TABLET | Freq: Every day | ORAL | Status: DC

## 2022-06-16 MED ORDER — 0.9 % SODIUM CHLORIDE (POUR BTL) OPTIME
TOPICAL | Status: DC | PRN
  Administered 2022-06-16: 4000 mL

## 2022-06-16 MED ORDER — ROCURONIUM BROMIDE 10 MG/ML (PF) SYRINGE
PREFILLED_SYRINGE | INTRAVENOUS | Status: DC | PRN
  Administered 2022-06-16: 40 mg via INTRAVENOUS
  Administered 2022-06-16: 20 mg via INTRAVENOUS
  Administered 2022-06-16: 60 mg via INTRAVENOUS
  Administered 2022-06-16 (×2): 40 mg via INTRAVENOUS

## 2022-06-16 MED ORDER — METOPROLOL TARTRATE 12.5 MG HALF TABLET
12.5000 mg | ORAL_TABLET | Freq: Once | ORAL | Status: AC
  Administered 2022-06-16: 12.5 mg via ORAL
  Filled 2022-06-16: qty 1

## 2022-06-16 MED ORDER — MIDAZOLAM HCL (PF) 5 MG/ML IJ SOLN
INTRAMUSCULAR | Status: DC | PRN
  Administered 2022-06-16 (×4): 1 mg via INTRAVENOUS
  Administered 2022-06-16 (×2): 2 mg via INTRAVENOUS
  Administered 2022-06-16 (×2): 1 mg via INTRAVENOUS

## 2022-06-16 MED ORDER — ACETAMINOPHEN 650 MG RE SUPP
650.0000 mg | Freq: Once | RECTAL | Status: AC
  Administered 2022-06-16: 650 mg via RECTAL

## 2022-06-16 MED ORDER — PREGABALIN 25 MG PO CAPS
75.0000 mg | ORAL_CAPSULE | Freq: Every day | ORAL | Status: DC
  Administered 2022-06-17 – 2022-06-23 (×7): 75 mg via ORAL
  Filled 2022-06-16 (×3): qty 1
  Filled 2022-06-16: qty 3
  Filled 2022-06-16 (×2): qty 1
  Filled 2022-06-16: qty 3

## 2022-06-16 MED ORDER — LACTATED RINGERS IV SOLN: 500.0000 mL | Freq: Once | INTRAVENOUS | Status: DC | PRN

## 2022-06-16 MED ORDER — MIDAZOLAM HCL (PF) 10 MG/2ML IJ SOLN
INTRAMUSCULAR | Status: AC
  Filled 2022-06-16: qty 2

## 2022-06-16 MED ORDER — SODIUM CHLORIDE 0.9 % IV SOLN: INTRAVENOUS | Status: DC | PRN

## 2022-06-16 MED ORDER — ~~LOC~~ CARDIAC SURGERY, PATIENT & FAMILY EDUCATION
Freq: Once | Status: DC
  Filled 2022-06-16: qty 1

## 2022-06-16 MED ORDER — DEXMEDETOMIDINE HCL IN NACL 400 MCG/100ML IV SOLN
0.0000 ug/kg/h | INTRAVENOUS | Status: DC
  Administered 2022-06-16: 0.1 ug/kg/h via INTRAVENOUS
  Administered 2022-06-16: 0.7 ug/kg/h via INTRAVENOUS
  Filled 2022-06-16: qty 100

## 2022-06-16 MED ORDER — SODIUM BICARBONATE 8.4 % IV SOLN
50.0000 meq | Freq: Once | INTRAVENOUS | Status: AC
  Administered 2022-06-16: 50 meq via INTRAVENOUS

## 2022-06-16 MED ORDER — ONDANSETRON HCL 4 MG/2ML IJ SOLN
4.0000 mg | Freq: Four times a day (QID) | INTRAMUSCULAR | Status: DC | PRN
  Administered 2022-06-18 – 2022-06-22 (×2): 4 mg via INTRAVENOUS
  Filled 2022-06-16 (×2): qty 2

## 2022-06-16 MED ORDER — THROMBIN (RECOMBINANT) 20000 UNITS EX SOLR
CUTANEOUS | Status: AC
  Filled 2022-06-16: qty 20000

## 2022-06-16 MED ORDER — MIDAZOLAM HCL 2 MG/2ML IJ SOLN: 2.0000 mg | INTRAMUSCULAR | Status: DC | PRN

## 2022-06-16 MED ORDER — PROTAMINE SULFATE 10 MG/ML IV SOLN
INTRAVENOUS | Status: DC | PRN
  Administered 2022-06-16: 20 mg via INTRAVENOUS
  Administered 2022-06-16: 250 mg via INTRAVENOUS

## 2022-06-16 MED ORDER — MORPHINE SULFATE (PF) 2 MG/ML IV SOLN
1.0000 mg | INTRAVENOUS | Status: DC | PRN
  Administered 2022-06-16: 2 mg via INTRAVENOUS
  Administered 2022-06-16: 1 mg via INTRAVENOUS
  Administered 2022-06-17: 2 mg via INTRAVENOUS
  Administered 2022-06-17: 4 mg via INTRAVENOUS
  Filled 2022-06-16: qty 2
  Filled 2022-06-16 (×3): qty 1

## 2022-06-16 MED ORDER — CHLORHEXIDINE GLUCONATE 0.12 % MT SOLN
15.0000 mL | Freq: Once | OROMUCOSAL | Status: AC
  Administered 2022-06-16: 15 mL via OROMUCOSAL
  Filled 2022-06-16: qty 15

## 2022-06-16 MED ORDER — SODIUM CHLORIDE 0.9% FLUSH
3.0000 mL | Freq: Two times a day (BID) | INTRAVENOUS | Status: DC
  Administered 2022-06-17 – 2022-06-22 (×10): 3 mL via INTRAVENOUS

## 2022-06-16 MED ORDER — CHLORHEXIDINE GLUCONATE 0.12 % MT SOLN
15.0000 mL | OROMUCOSAL | Status: AC
  Administered 2022-06-16: 15 mL via OROMUCOSAL
  Filled 2022-06-16: qty 15

## 2022-06-16 MED ORDER — SODIUM CHLORIDE 0.9 % IV SOLN: INTRAVENOUS | Status: DC

## 2022-06-16 MED ORDER — OXYCODONE HCL 5 MG PO TABS
5.0000 mg | ORAL_TABLET | ORAL | Status: DC | PRN
  Administered 2022-06-16 – 2022-06-22 (×5): 10 mg via ORAL
  Filled 2022-06-16 (×7): qty 2

## 2022-06-16 MED ORDER — EPHEDRINE 5 MG/ML INJ
INTRAVENOUS | Status: AC
  Filled 2022-06-16: qty 5

## 2022-06-16 MED ORDER — ASPIRIN 325 MG PO TBEC
325.0000 mg | DELAYED_RELEASE_TABLET | Freq: Every day | ORAL | Status: DC
  Administered 2022-06-17 – 2022-06-22 (×6): 325 mg via ORAL
  Filled 2022-06-16 (×6): qty 1

## 2022-06-16 MED ORDER — METOPROLOL TARTRATE 25 MG/10 ML ORAL SUSPENSION: 12.5000 mg | Freq: Two times a day (BID) | ORAL | Status: DC

## 2022-06-16 MED ORDER — ATORVASTATIN CALCIUM 10 MG PO TABS
10.0000 mg | ORAL_TABLET | Freq: Every day | ORAL | Status: DC
  Administered 2022-06-17 – 2022-06-24 (×8): 10 mg via ORAL
  Filled 2022-06-16 (×8): qty 1

## 2022-06-16 MED ORDER — ALBUTEROL SULFATE HFA 108 (90 BASE) MCG/ACT IN AERS: 2.0000 | INHALATION_SPRAY | Freq: Four times a day (QID) | RESPIRATORY_TRACT | Status: DC | PRN

## 2022-06-16 MED ORDER — CEFAZOLIN SODIUM-DEXTROSE 2-3 GM-%(50ML) IV SOLR
INTRAVENOUS | Status: DC | PRN
  Administered 2022-06-16: 2 g via INTRAVENOUS

## 2022-06-16 MED ORDER — POTASSIUM CHLORIDE 10 MEQ/50ML IV SOLN: 10.0000 meq | INTRAVENOUS | Status: AC

## 2022-06-16 MED ORDER — CHLORHEXIDINE GLUCONATE 4 % EX LIQD: 30.0000 mL | CUTANEOUS | Status: DC

## 2022-06-16 MED ORDER — ONDANSETRON HCL 4 MG/2ML IJ SOLN
INTRAMUSCULAR | Status: AC
  Filled 2022-06-16: qty 2

## 2022-06-16 MED ORDER — PROPOFOL 10 MG/ML IV BOLUS
INTRAVENOUS | Status: DC | PRN
  Administered 2022-06-16: 80 mg via INTRAVENOUS
  Administered 2022-06-16: 20 mg via INTRAVENOUS

## 2022-06-16 MED ORDER — HEMOSTATIC AGENTS (NO CHARGE) OPTIME
TOPICAL | Status: DC | PRN
  Administered 2022-06-16: 1 via TOPICAL

## 2022-06-16 MED ORDER — NITROGLYCERIN IN D5W 200-5 MCG/ML-% IV SOLN: 0.0000 ug/min | INTRAVENOUS | Status: DC

## 2022-06-16 MED ORDER — ALBUMIN HUMAN 5 % IV SOLN
250.0000 mL | INTRAVENOUS | Status: DC | PRN
  Administered 2022-06-16 – 2022-06-17 (×3): 12.5 g via INTRAVENOUS
  Filled 2022-06-16: qty 250

## 2022-06-16 MED ORDER — DEXTROSE 50 % IV SOLN: 0.0000 mL | INTRAVENOUS | Status: DC | PRN

## 2022-06-16 MED ORDER — PROPOFOL 10 MG/ML IV BOLUS
INTRAVENOUS | Status: AC
  Filled 2022-06-16: qty 20

## 2022-06-16 MED ORDER — DOCUSATE SODIUM 100 MG PO CAPS
200.0000 mg | ORAL_CAPSULE | Freq: Every day | ORAL | Status: DC
  Administered 2022-06-17 – 2022-06-22 (×6): 200 mg via ORAL
  Filled 2022-06-16 (×6): qty 2

## 2022-06-16 MED ORDER — PANTOPRAZOLE SODIUM 40 MG PO TBEC
40.0000 mg | DELAYED_RELEASE_TABLET | Freq: Every day | ORAL | Status: DC
  Administered 2022-06-18 – 2022-06-24 (×7): 40 mg via ORAL
  Filled 2022-06-16 (×7): qty 1

## 2022-06-16 MED ORDER — INSULIN REGULAR(HUMAN) IN NACL 100-0.9 UT/100ML-% IV SOLN
INTRAVENOUS | Status: DC
  Administered 2022-06-16: 0.4 [IU]/h via INTRAVENOUS
  Filled 2022-06-16: qty 100

## 2022-06-16 MED ORDER — ACETAMINOPHEN 160 MG/5ML PO SOLN: 650.0000 mg | Freq: Once | ORAL | Status: AC

## 2022-06-16 MED ORDER — FAMOTIDINE IN NACL 20-0.9 MG/50ML-% IV SOLN
20.0000 mg | Freq: Two times a day (BID) | INTRAVENOUS | Status: AC
  Administered 2022-06-16 (×2): 20 mg via INTRAVENOUS
  Filled 2022-06-16 (×2): qty 50

## 2022-06-16 MED ORDER — LIDOCAINE 2% (20 MG/ML) 5 ML SYRINGE
INTRAMUSCULAR | Status: AC
  Filled 2022-06-16: qty 5

## 2022-06-16 MED ORDER — ONDANSETRON HCL 4 MG/2ML IJ SOLN
INTRAMUSCULAR | Status: DC | PRN
  Administered 2022-06-16: 4 mg via INTRAVENOUS

## 2022-06-16 MED ORDER — CLONAZEPAM 0.5 MG PO TABS
0.5000 mg | ORAL_TABLET | Freq: Every day | ORAL | Status: DC
  Administered 2022-06-17 – 2022-06-20 (×4): 0.5 mg via ORAL
  Filled 2022-06-16 (×4): qty 1

## 2022-06-16 MED ORDER — ROPINIROLE HCL 1 MG PO TABS
3.0000 mg | ORAL_TABLET | Freq: Every day | ORAL | Status: DC
  Administered 2022-06-17 – 2022-06-23 (×7): 3 mg via ORAL
  Filled 2022-06-16 (×8): qty 3

## 2022-06-16 MED ORDER — FENTANYL CITRATE (PF) 250 MCG/5ML IJ SOLN
INTRAMUSCULAR | Status: DC | PRN
  Administered 2022-06-16: 75 ug via INTRAVENOUS
  Administered 2022-06-16 (×2): 100 ug via INTRAVENOUS
  Administered 2022-06-16: 25 ug via INTRAVENOUS
  Administered 2022-06-16: 100 ug via INTRAVENOUS
  Administered 2022-06-16: 150 ug via INTRAVENOUS
  Administered 2022-06-16 (×2): 50 ug via INTRAVENOUS
  Administered 2022-06-16: 100 ug via INTRAVENOUS
  Administered 2022-06-16: 50 ug via INTRAVENOUS
  Administered 2022-06-16 (×3): 100 ug via INTRAVENOUS

## 2022-06-16 MED ORDER — TRAMADOL HCL 50 MG PO TABS
50.0000 mg | ORAL_TABLET | ORAL | Status: DC | PRN
  Administered 2022-06-17 – 2022-06-18 (×3): 50 mg via ORAL
  Administered 2022-06-18 – 2022-06-19 (×2): 100 mg via ORAL
  Filled 2022-06-16 (×3): qty 2
  Filled 2022-06-16 (×3): qty 1

## 2022-06-16 MED ORDER — SODIUM CHLORIDE 0.9% FLUSH: 3.0000 mL | INTRAVENOUS | Status: DC | PRN

## 2022-06-16 MED ORDER — METOPROLOL TARTRATE 12.5 MG HALF TABLET: 12.5000 mg | ORAL_TABLET | Freq: Two times a day (BID) | ORAL | Status: DC

## 2022-06-16 MED ORDER — BISACODYL 5 MG PO TBEC
10.0000 mg | DELAYED_RELEASE_TABLET | Freq: Every day | ORAL | Status: DC
  Administered 2022-06-17 – 2022-06-22 (×6): 10 mg via ORAL
  Filled 2022-06-16 (×6): qty 2

## 2022-06-16 MED ORDER — NITROGLYCERIN 0.2 MG/ML ON CALL CATH LAB
INTRAVENOUS | Status: DC | PRN
  Administered 2022-06-16: 20 ug via INTRAVENOUS

## 2022-06-16 SURGICAL SUPPLY — 86 items
ADAPTER CARDIO PERF ANTE/RETRO (ADAPTER) ×2 IMPLANT
ADPR CRDPLG 7.5 .25D 1 LRG Y (ADAPTER) ×2
ADPR PRFSN 84XANTGRD RTRGD (ADAPTER) ×2
BAG DECANTER FOR FLEXI CONT (MISCELLANEOUS) ×2 IMPLANT
BLADE CLIPPER SURG (BLADE) ×2 IMPLANT
BLADE STERNUM SYSTEM 6 (BLADE) ×2 IMPLANT
BLADE SURG 15 STRL LF DISP TIS (BLADE) ×2 IMPLANT
BLADE SURG 15 STRL SS (BLADE) ×2
CANISTER SUCT 3000ML PPV (MISCELLANEOUS) ×2 IMPLANT
CANNULA ARTERIAL NVNT 3/8 20FR (MISCELLANEOUS) IMPLANT
CANNULA GUNDRY RCSP 15FR (MISCELLANEOUS) ×2 IMPLANT
CANNULA MC2 2 STG 36/46 CONN (CANNULA) IMPLANT
CATH HEART VENT LEFT (CATHETERS) ×2 IMPLANT
CATH ROBINSON RED A/P 18FR (CATHETERS) ×6 IMPLANT
CATH THORACIC 36FR (CATHETERS) ×2 IMPLANT
CATH THORACIC 36FR RT ANG (CATHETERS) ×2 IMPLANT
CNTNR URN SCR LID CUP LEK RST (MISCELLANEOUS) ×2 IMPLANT
CONT SPEC 4OZ STRL OR WHT (MISCELLANEOUS) ×2
CONTAINER PROTECT SURGISLUSH (MISCELLANEOUS) ×4 IMPLANT
COVER SURGICAL LIGHT HANDLE (MISCELLANEOUS) ×2 IMPLANT
DEVICE SUT CK QUICK LOAD MINI (Prosthesis & Implant Heart) IMPLANT
DRAPE CARDIOVASCULAR INCISE (DRAPES) ×2
DRAPE SRG 135X102X78XABS (DRAPES) ×2 IMPLANT
DRAPE WARM FLUID 44X44 (DRAPES) ×2 IMPLANT
DRSG COVADERM 4X14 (GAUZE/BANDAGES/DRESSINGS) ×2 IMPLANT
ELECT CAUTERY BLADE 6.4 (BLADE) ×2 IMPLANT
ELECT REM PT RETURN 9FT ADLT (ELECTROSURGICAL) ×4
ELECTRODE REM PT RTRN 9FT ADLT (ELECTROSURGICAL) ×4 IMPLANT
FELT TEFLON 1X6 (MISCELLANEOUS) ×4 IMPLANT
GAUZE 4X4 16PLY ~~LOC~~+RFID DBL (SPONGE) ×2 IMPLANT
GAUZE SPONGE 4X4 12PLY STRL (GAUZE/BANDAGES/DRESSINGS) ×2 IMPLANT
GLOVE BIO SURGEON STRL SZ 6 (GLOVE) IMPLANT
GLOVE BIO SURGEON STRL SZ 6.5 (GLOVE) IMPLANT
GLOVE BIO SURGEON STRL SZ7 (GLOVE) IMPLANT
GLOVE BIO SURGEON STRL SZ7.5 (GLOVE) IMPLANT
GLOVE BIOGEL PI IND STRL 7.0 (GLOVE) IMPLANT
GLOVE SURG MICRO LTX SZ7 (GLOVE) ×4 IMPLANT
GOWN STRL REUS W/ TWL LRG LVL3 (GOWN DISPOSABLE) ×8 IMPLANT
GOWN STRL REUS W/ TWL XL LVL3 (GOWN DISPOSABLE) ×2 IMPLANT
GOWN STRL REUS W/TWL LRG LVL3 (GOWN DISPOSABLE) ×8
GOWN STRL REUS W/TWL XL LVL3 (GOWN DISPOSABLE) ×2
HEMOSTAT POWDER SURGIFOAM 1G (HEMOSTASIS) ×6 IMPLANT
HEMOSTAT SURGICEL 2X14 (HEMOSTASIS) ×2 IMPLANT
KIT BASIN OR (CUSTOM PROCEDURE TRAY) ×2 IMPLANT
KIT CATH CPB BARTLE (MISCELLANEOUS) ×2 IMPLANT
KIT SUCTION CATH 14FR (SUCTIONS) ×2 IMPLANT
KIT SUT CK MINI COMBO 4X17 (Prosthesis & Implant Heart) IMPLANT
KIT TURNOVER KIT B (KITS) ×2 IMPLANT
LINE EXTENSION DELIVERY (MISCELLANEOUS) IMPLANT
LINE VENT (MISCELLANEOUS) IMPLANT
NS IRRIG 1000ML POUR BTL (IV SOLUTION) ×12 IMPLANT
PACK E OPEN HEART (SUTURE) ×2 IMPLANT
PACK OPEN HEART (CUSTOM PROCEDURE TRAY) ×2 IMPLANT
PAD ARMBOARD 7.5X6 YLW CONV (MISCELLANEOUS) ×4 IMPLANT
POSITIONER HEAD DONUT 9IN (MISCELLANEOUS) ×2 IMPLANT
SET MPS 3-ND DEL (MISCELLANEOUS) IMPLANT
SET Y-VENT ADPTR 7.5 MALE LUER (ADAPTER) IMPLANT
SPONGE T-LAP 18X18 ~~LOC~~+RFID (SPONGE) ×8 IMPLANT
SPONGE T-LAP 4X18 ~~LOC~~+RFID (SPONGE) ×2 IMPLANT
STOPCOCK 4 WAY LG BORE MALE ST (IV SETS) IMPLANT
SUT BONE WAX W31G (SUTURE) ×2 IMPLANT
SUT EB EXC GRN/WHT 2-0 V-5 (SUTURE) ×4 IMPLANT
SUT ETHIBON EXCEL 2-0 V-5 (SUTURE) IMPLANT
SUT ETHIBOND 2-0 30 1/2 V-5 (SUTURE) IMPLANT
SUT ETHIBOND V-5 VALVE (SUTURE) IMPLANT
SUT PROLENE 3 0 SH DA (SUTURE) IMPLANT
SUT PROLENE 3 0 SH1 36 (SUTURE) ×2 IMPLANT
SUT PROLENE 4 0 RB 1 (SUTURE) ×8
SUT PROLENE 4-0 RB1 .5 CRCL 36 (SUTURE) ×6 IMPLANT
SUT STEEL 6MS V (SUTURE) IMPLANT
SUT VIC AB 1 CTX 36 (SUTURE) ×4
SUT VIC AB 1 CTX36XBRD ANBCTR (SUTURE) ×4 IMPLANT
SYSTEM SAHARA CHEST DRAIN ATS (WOUND CARE) ×2 IMPLANT
TAPE CLOTH SURG 4X10 WHT LF (GAUZE/BANDAGES/DRESSINGS) IMPLANT
TAPE PAPER 2X10 WHT MICROPORE (GAUZE/BANDAGES/DRESSINGS) IMPLANT
TOWEL GREEN STERILE (TOWEL DISPOSABLE) ×2 IMPLANT
TOWEL GREEN STERILE FF (TOWEL DISPOSABLE) ×2 IMPLANT
TOWEL OR NON WOVEN STRL DISP B (DISPOSABLE) IMPLANT
TRAY FOLEY SLVR 14FR TEMP STAT (SET/KITS/TRAYS/PACK) IMPLANT
TRAY FOLEY SLVR 16FR TEMP STAT (SET/KITS/TRAYS/PACK) ×2 IMPLANT
TUBE SUCT INTRACARD DLP 20F (MISCELLANEOUS) IMPLANT
TUBING ART PRESS 48 MALE/FEM (TUBING) IMPLANT
UNDERPAD 30X36 HEAVY ABSORB (UNDERPADS AND DIAPERS) ×2 IMPLANT
VALVE AORTIC SZ21 INSP/RESIL (Valve) IMPLANT
VENT LEFT HEART 12002 (CATHETERS) ×2
WATER STERILE IRR 1000ML POUR (IV SOLUTION) ×4 IMPLANT

## 2022-06-16 NOTE — Discharge Summary (Signed)
Physician Discharge Summary  Patient ID: Carolyn [name] MRN: [id number] DOB/AGE: 1950-09-23 72 y.o.  Admit date: 06/16/2022 Discharge date: 06/24/2022  Admission Diagnoses:  Severe aortic stenosis Unstable angina pectoris Chronic obstructive pulmonary disease Tobacco abuse History of pulmonary nodule Hypothyroidism Restless leg syndrome Depression  Discharge Diagnoses:   Severe aortic stenosis Unstable angina pectoris Status post aortic valve replacement Post-operative atrial fibrillation Chronic obstructive pulmonary disease Acute exacerbation of chronic bronchitis Tobacco abuse History of pulmonary nodule Hypothyroidism Restless leg syndrome Depression   Discharged Condition: stable  PCP is [name], DO Referring Provider is Lenna Sciara, MD Primary Cardiologist is Kirk Ruths, MD  History of Present Illness: The patient is a 72 year old woman with a history of hyperlipidemia, smoking and COPD, hypothyroidism, and moderate to severe aortic stenosis who reports progressive exertional fatigue and shortness of breath over the past year with recent episodes of substernal chest discomfort at rest and with exertion as well as episodes of dizziness.  She was recently seen in the emergency department on 06/01/2022 due to chest discomfort and dizziness.  She says she felt like she was going to pass out.  High-sensitivity troponin was negative.  There were no significant ECG changes.  A repeat echocardiogram showed a functionally bicuspid aortic valve with markedly thickened leaflets.  There is mild calcification.  The mean gradient was measured at 33.5 mmHg with a dimensionless index of 0.34 with mild to moderate aortic insufficiency.  Left ventricular ejection fraction was 65 to 70% with mild LVH and grade 1 diastolic dysfunction.  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  Right heart  pressures were normal with a cardiac index of 2.8.  She was seen by Dr. Ali Lowe for consideration of TAVR.  A gated cardiac CTA showed a trileaflet aortic valve with severely thickened and mild to moderately calcified aortic valve cusps.  The aortic valve calcium score was only 319.   Since discharge she reports that she has continued to have exertional fatigue and shortness of breath.  She has had some twinges of chest discomfort with and without exertion.  She denies any orthopnea or PND.  She has continued to smoke about half pack of cigarettes per day but is smoked more in the past.  Course in Hospital: Carolyn [name] was admitted to the hospital for elective surgery on 06/16/2022.  She was taken to the operating room where aortic valve replacement was accomplished utilizing an Edwards Inspiris 21 mm bovine pericardial tissue valve.  Following the procedure, she separated from cardiopulmonary bypass without any inotropic support.  She was transferred to the surgical ICU in stable condition.  Vital sins and hemodynamics were stable. She was weaned from ventilator support and extubated without difficulty.  She developed a productive cough and mild leukocytosis so was started on empiric IV cefepime for acute exacerbation of chronic bronchitis.  Pulmonary hygiene was encouraged with incentive spirometry, flutter valve, and twice daily Mucinex. The cough cleared, CXR improved, and WBC normalized.   Carolyn [name] developed atrial fibrillation that was treated with IV amiodarone loading with prompt conversion back to sinus rhythm. The amiodarone was transitioned to the oral medication and the rhythm remained stable.   She was relatively hypotensive after surgery and required support with neo-synephrine early post-op followed by oral Midodrine through the remainder of the admission.  For this reason she was not diuresed aggressively while an inpatient. By the time of discharge, her SBP had improved to the  130's so the Midodrine was decreased and daily Lasix will be continued at home after discharge.    Carolyn [name] was ready for discharge on post-op day 8.  She was independent with ambulation and transfers and tolerating a regular diet with no trouble. The sternal incision was healing with no sign of complication. She was encouraged to stop smoking but said she had no plans to do so.  Consults: None  Significant Diagnostic Studies:  CLINICAL DATA:  Pleural effusion   EXAM: PORTABLE CHEST 1 VIEW   COMPARISON:  Previous studies including the examination of 06/20/2022   FINDINGS: Transverse diameter of heart is increased. Prosthetic aortic valve is seen. Tip of right IJ central venous catheter is seen in superior vena cava. There is increased density in right lower lung fields suggesting atelectasis/pneumonia with interval improvement. Linear densities are seen in left parahilar region. This may suggest scarring and subsegmental atelectasis. There is no pneumothorax.   IMPRESSION: There is interval improvement in the aeration in right lower lung field suggesting decrease in pleural effusion and atelectasis/pneumonia. There are no new infiltrates or signs of pulmonary edema.     Electronically Signed   By: Elmer Picker M.D.   On: 06/22/2022 08:48  Treatments:   CARDIOVASCULAR SURGERY OPERATIVE NOTE   06/16/2022 Lulu Yohannes [id number]   Surgeon:  [name], MD   First Assistant: Enid Cutter,  PA-C: An experienced assistant was required given the complexity of this surgery and the standard of surgical care. The assistant was needed for exposure, dissection, suctioning, retraction of delicate tissues and sutures, instrument exchange and for overall help during this procedure.      Preoperative Diagnosis:  Severe aortic stenosis and insufficiency     Postoperative Diagnosis:  Same     Procedure:   Median Sternotomy Extracorporeal circulation 3.    Aortic valve replacement using a 21 mm Edwards INSPIRIS RESILIA pericardial valve.   Anesthesia:  General Endotracheal    Discharge Exam: Blood pressure (!) 92/56, pulse 72, temperature 97.8 F (36.6 C), temperature source Oral, resp. rate 17, height 5\' 5"  (1.651 m), weight 81.8 kg, SpO2 98 %.  General appearance: alert, cooperative, no distress, and anxious Neurologic: intact Heart: RRR Lungs: mild exp wheezes Abdomen: soft, NT Extremities: mild edema in feet and ankles, minimal pre-tibial edema Wound: the sternotomy incision is healing well.  Disposition:  Discharged to home in stable condition with home health services.    Discharge Instructions     Amb Referral to Cardiac Rehabilitation   Complete by: As directed    [facility]   Diagnosis: Valve Replacement   Valve: Aortic   After initial evaluation and assessments completed: Virtual Based Care may be provided alone or in conjunction with Phase 2 Cardiac Rehab based on patient barriers.: Yes   Intensive Cardiac Rehabilitation (ICR) [place] location only OR Traditional Cardiac Rehabilitation (TCR) *If criteria for ICR are not met will enroll in TCR [facility] only): Yes      Allergies as of 06/24/2022       Reactions   Trazodone And Nefazodone Shortness Of Breath   Azithromycin Rash        Medication List     STOP taking these medications    celecoxib 200 MG capsule Commonly known as: CELEBREX       TAKE these medications    albuterol 108 (90 Base) MCG/ACT inhaler Commonly known as: VENTOLIN HFA Inhale 2 puffs into the lungs every 6 (six) hours as needed for  wheezing or shortness of breath.   amiodarone 200 MG tablet Commonly known as: PACERONE Take 1 tablet (200 mg total) by mouth 2 (two) times daily.   ascorbic acid 500 MG tablet Commonly known as: VITAMIN C Take 500 mg by mouth daily.   aspirin EC 81 MG tablet TAKE 1 TABLET BY MOUTH EVERY DAY   atorvastatin 10 MG tablet Commonly known as:  LIPITOR TAKE 1 TABLET BY MOUTH EVERY DAY   baclofen 10 MG tablet Commonly known as: LIORESAL TAKE 1 TABLET BY MOUTH THREE TIMES A DAY What changed:  when to take this reasons to take this   Breo Ellipta 100-25 MCG/ACT Aepb Generic drug: fluticasone furoate-vilanterol TAKE 1 PUFF BY MOUTH EVERY DAY   CALCIUM 600 + D PO Take 1 tablet by mouth 2 (two) times daily.   CENTRUM SILVER ADULT 50+ PO Take 1 tablet by mouth daily.   clonazePAM 0.5 MG tablet Commonly known as: KLONOPIN TAKE 1 TABLET BY MOUTH EVERYDAY AT BEDTIME   Dialyvite Vitamin D 5000 125 MCG (5000 UT) capsule Generic drug: Cholecalciferol Take 5,000 Units by mouth daily.   ferrous sulfate 325 (65 FE) MG EC tablet Take 1 tablet (325 mg total) by mouth daily with breakfast.   Fish Oil 1200 MG Caps Take 1,200 mg by mouth daily.   fluticasone 50 MCG/ACT nasal spray Commonly known as: FLONASE INSTILL 1 SPRAY INTO BOTH NOSTRILS DAILY What changed:  how much to take how to take this when to take this additional instructions   furosemide 40 MG tablet Commonly known as: LASIX Take 1 tablet (40 mg total) by mouth daily.   Garlic 123XX123 MG Caps Take 1,000 mg by mouth daily.   levothyroxine 100 MCG tablet Commonly known as: SYNTHROID TAKE 1 TABLET BY MOUTH DAILY BEFORE BREAKFAST.   magnesium hydroxide 400 MG/5ML suspension Commonly known as: MILK OF MAGNESIA Take 15 mLs by mouth daily as needed for mild constipation.   midodrine 5 MG tablet Commonly known as: PROAMATINE Take 1 tablet (5 mg total) by mouth 3 (three) times daily with meals. For 7 days then decrease the dose to 1 tablet (5mg ) twice daily   potassium chloride SA 20 MEQ tablet Commonly known as: KLOR-CON M Take 1 tablet (20 mEq total) by mouth daily for 7 days.   pregabalin 75 MG capsule Commonly known as: LYRICA Take 1 capsule (75 mg total) by mouth at bedtime as needed. What changed: when to take this   rOPINIRole 3 MG tablet Commonly  known as: REQUIP TAKE 1 TABLET BY MOUTH AT BEDTIME.   traMADol 50 MG tablet Commonly known as: ULTRAM Take 1 tablet (50 mg total) by mouth every 6 (six) hours as needed for up to 5 days for moderate pain.   zinc gluconate 50 MG tablet Take 50 mg by mouth daily.        Follow-up Information     [facility]. Go on 06/30/2022.   Specialty: Cardiothoracic Surgery Why: Your appointment for suture removal is at  10am Contact information: [place], [place] [phone]        [name], MD. Go on 07/15/2022.   Specialty: Cardiothoracic Surgery Why: Your appointment is at 11:30am. Please obtain an chest x-ray 1 hour before the apointment at [facility] located at [address]. Contact information: [place] [address] [phone]  Mayra Reel, NP Follow up.   Specialty: Cardiology Why: Hospital follow-up with Cardiology scheduled for 07/08/2021 at 9:30am. Please arrive 15 minutes early for check-in. If this date/time does not work for you, please call our office to reschedule. Contact information: [address] [place] [address] [phone]         [facility] Follow up.   Specialty: Cardiology Why: Appointment for Echocardiogram scheduled for 07/28/2022 at 9:20am. If this date/time does not work for you, please call our office to reschedule.   Of note, Echo that was initially scheduled for July has been cancelled now that the above Echo has been arranged. Contact information: [address], [place] [id number] [place]. Follow up.   Why: Latricia Heft)- HH MD office protocol referral- they will contact you post discharge to follow up and schedule Contact information: [place] [address] [phone]                 The patient has been discharged on:   1.Beta Blocker:  Yes [   ]                              No   [ x  ]                              If No, reason: Hypotension  2.Ace Inhibitor/ARB: Yes [   ]                                     No  [  x  ]                                     If No, reason: Not indicated  3.Statin:   Yes [  x ]                  No  [   ]                  If No, reason:  4.Ecasa:  Yes  [ x ]                  No   [   ]                  If No, reason:  5. ACS on Admission?  No  P2Y12 Inhibitor:  Yes  [   ]                                No  [ x ]    Signed: Antony Odea, PA-C 06/24/2022, 8:19 AM

## 2022-06-16 NOTE — Discharge Instructions (Signed)

## 2022-06-16 NOTE — Procedures (Signed)
Extubation Procedure Note  Patient Details:   Name: Carolyn [name] DOB: 25-Mar-1951 MRN: [id number]   Airway Documentation:    Vent end date: 06/16/22 Vent end time: 1642   Evaluation  O2 sats: stable throughout Complications: No apparent complications Patient did tolerate procedure well. Bilateral Breath Sounds: Diminished   Yes  NIF -30 VC 1.1  Pt extubated to [place] with RN X2 at bedside. Positive cuff leak noted and pt is tolerating well.  Racheal Patches 06/16/2022, 4:43 PM

## 2022-06-16 NOTE — Anesthesia Postprocedure Evaluation (Signed)
Anesthesia Post Note  Patient: Carolyn [name]  Procedure(s) Performed: AORTIC VALVE REPLACEMENT (AVR) USING EDWARDS INSPIRIS SIZE 21MM (Chest) TRANSESOPHAGEAL ECHOCARDIOGRAM     Patient location during evaluation: SICU Anesthesia Type: General Level of consciousness: sedated Pain management: pain level controlled Vital Signs Assessment: post-procedure vital signs reviewed and stable Respiratory status: patient remains intubated per anesthesia plan Cardiovascular status: stable Postop Assessment: no apparent nausea or vomiting Anesthetic complications: no   No notable events documented.  Last Vitals:  Vitals:   06/16/22 1330 06/16/22 1345  BP:    Pulse: 79 78  Resp: 16 16  Temp: (!) 36.2 C (!) 36.3 C  SpO2: 99% 99%    Last Pain:  Vitals:   06/16/22 0553  TempSrc:   PainSc: 0-No pain                 Belenda Cruise P Kyliah Deanda

## 2022-06-16 NOTE — Progress Notes (Signed)
Patient ID: Carolyn [name], female   DOB: February 25, 1951, 72 y.o.   MRN: [id number]  TCTS Evening Rounds:   Hemodynamically stable  CI = 2.2  Still asleep on vent  Urine output good  CT output low  CBC    Component Value Date/Time   WBC 10.7 (H) 06/16/2022 1205   RBC 3.62 (L) 06/16/2022 1205   HGB 11.0 (L) 06/16/2022 1205   HGB 14.7 10/14/2018 1114   HCT 32.4 (L) 06/16/2022 1205   HCT 43.0 10/14/2018 1114   PLT 116 (L) 06/16/2022 1205   PLT 212 10/14/2018 1114   MCV 89.5 06/16/2022 1205   MCV 89 10/14/2018 1114   MCH 30.4 06/16/2022 1205   MCHC 34.0 06/16/2022 1205   RDW 13.9 06/16/2022 1205   RDW 12.8 10/14/2018 1114   LYMPHSABS 1,348 05/18/2022 1151   LYMPHSABS 1.4 10/14/2018 1114   MONOABS 0.6 10/28/2006 1354   EOSABS 99 05/18/2022 1151   EOSABS 0.0 10/14/2018 1114   BASOSABS 72 05/18/2022 1151   BASOSABS 0.1 10/14/2018 1114     BMET    Component Value Date/Time   NA 140 06/16/2022 1055   NA 131 (L) 10/14/2018 1114   K 4.6 06/16/2022 1055   CL 104 06/16/2022 1055   CO2 20 (L) 06/12/2022 1430   GLUCOSE 103 (H) 06/16/2022 1055   BUN 12 06/16/2022 1055   BUN 11 10/14/2018 1114   CREATININE 0.50 06/16/2022 1055   CREATININE 0.70 05/18/2022 1151   CALCIUM 9.4 06/12/2022 1430   EGFR 94 05/18/2022 1151   GFRNONAA >60 06/12/2022 1430     A/P:  Stable postop course. Continue current plans

## 2022-06-16 NOTE — Op Note (Signed)
CARDIOVASCULAR SURGERY OPERATIVE NOTE  06/16/2022 [name]  Surgeon:  [name], MD  First Assistant: Enid Cutter,  PA-C: An experienced assistant was required given the complexity of this surgery and the standard of surgical care. The assistant was needed for exposure, dissection, suctioning, retraction of delicate tissues and sutures, instrument exchange and for overall help during this procedure.    Preoperative Diagnosis:  Severe aortic stenosis and insufficiency   Postoperative Diagnosis:  Same   Procedure:  Median Sternotomy Extracorporeal circulation 3.   Aortic valve replacement using a 21 mm Edwards INSPIRIS RESILIA pericardial valve.  Anesthesia:  General Endotracheal   Clinical History/Surgical Indication:  This 72 year old woman has stage D, severe, symptomatic aortic stenosis with probably moderate aortic insufficiency presenting with NYHA class lll symptoms of exertional fatigue and shortness of breath consistent with chronic diastolic congestive heart failure.  She has also developed episodes of substernal chest discomfort and dizziness.  I have personally reviewed her 2D echocardiogram, cardiac catheterization, and CTA studies.  Her 2D echo shows a trileaflet aortic valve with marked thickening of the valve leaflets with mild calcification.  The mean gradient was measured at 33.5 mmHg with a peak gradient of 60 mmHg and a calculated valve area of 0.45 cm.  Cardiac catheterization showed widely patent coronary arteries with normal right heart pressures.  I agree that aortic valve replacement is indicated in this patient for relief of her symptoms and to prevent left ventricular dysfunction.  Her gated cardiac CTA shows markedly thickened aortic valve leaflets with surprisingly little calcification.  I think that given her younger age, good functional status, and minimally calcified aortic valve leaflets that she would be best treated with open  surgical aortic valve replacement using a bioprosthetic valve.  I reviewed her echo and CT images with her and answered her questions. I discussed the operative procedure with the patient and family including alternatives, benefits and risks; including but not limited to bleeding, blood transfusion, infection, stroke, myocardial infarction, graft failure, heart block requiring a permanent pacemaker, organ dysfunction, and death.  Avea Pavloff understands and agrees to proceed.  Plan AVR using a bioprosthetic valve.   Preparation:  The patient was seen in the preoperative holding area and the correct patient, correct operation were confirmed with the patient after reviewing the medical record and catheterization. The consent was signed by me. Preoperative antibiotics were given. A pulmonary arterial line and radial arterial line were placed by the anesthesia team. The patient was taken back to the operating room and positioned supine on the operating room table. After being placed under general endotracheal anesthesia by the anesthesia team a foley catheter was placed. The neck, chest, abdomen, and both legs were prepped with betadine soap and solution and draped in the usual sterile manner. A surgical time-out was taken and the correct patient and operative procedure were confirmed with the nursing and anesthesia staff.   Pre-bypass TEE:   Complete TEE assessment was performed by Dr. Maryclare Bean. This showed severe aortic stenosis and probably severe AI. LV systolic function normal. Trivial MR.    Post-bypass TEE:   Normal functioning prosthetic aortic valve with no perivalvular leak or regurgitation through the valve. Mean gradient 10 mm Hg. Left ventricular function preserved. Unchanged trivial mitral regurgitation.    Cardiopulmonary Bypass:  A median sternotomy was performed. The pericardium was opened in the midline. Right ventricular function appeared normal. The ascending aorta was  of normal size and had no palpable plaque. There  were no contraindications to aortic cannulation or cross-clamping. The patient was fully systemically heparinized and the ACT was maintained > 400 sec. The proximal aortic arch was cannulated with a 22F aortic cannula for arterial inflow. Venous cannulation was performed via the right atrial appendage using a two-staged venous cannula. An antegrade cardioplegia/vent cannula was inserted into the mid-ascending aorta. A left ventricular vent was placed via the right superior pulmonary vein. A retrograde cardioplegia cannnula was placed into the coronary sinus via the right atrium. Aortic occlusion was performed with a single cross-clamp. Systemic cooling to 32 degrees Centigrade and topical cooling of the heart with iced saline were used. Cold retrograde KBC cardioplegia was used to induce diastolic arrest. Myocardial temp dropped to 9.5 degrees C. A temperature probe was inserted into the interventricular septum and an insulating pad was placed in the pericardium. Carbon dioxide was insufflated into the pericardium at 5L/min throughout the procedure to minimize intracardiac air.   Aortic Valve Replacement:   A transverse aortotomy was performed 1 cm above the take-off of the right coronary artery. The native valve was tricuspid with markedly thickened leaflets with minimal calcification and some fusion at the commissures and mild annular calcification. The ostia of the coronary arteries were in normal position and were not obstructed. The native valve leaflets were excised and the annulus was decalcified with rongeurs. Care was taken to remove all particulate debris. The left ventricle was directly inspected for debris and then irrigated with ice saline solution. The annulus was sized and a size 21 mm INSPRIRIS RESILIA  pericardial valve was chosen. The model number was [id number] and the serial number was [id number]. While the valve was being prepared 2-0 Ethibond  pledgeted horizontal mattress sutures were placed around the annulus with the pledgets in a sub-annular position. The sutures were placed through the sewing ring and the valve lowered into place. The sutures were tied using the CorKnot system. The valve seated nicely and the coronary ostia were not obstructed. The prosthetic valve leaflets moved normally and there was no sub-valvular obstruction. The aortotomy was closed using 4-0 Prolene suture in 2 layers with felt strips to reinforce the closure.  Completion:  The patient was rewarmed to 37 degrees Centigrade. A dose of warm retrograde reanimation cardioplegia was given. De-airing maneuvers were performed and the head placed in trendelenburg position. The crossclamp was removed with a time of 73 minutes. There was spontaneous return of sinus rhythm. The aortotomy was checked for hemostasis. Two temporary epicardial pacing wires were placed on the right atrium and two on the right ventricle. The left ventricular vent and retrograde cardioplegia cannulas were removed. The patient was weaned from CPB without difficulty on no inotropes. CPB time was 94 minutes. Cardiac output was 5 LPM. Heparin was fully reversed with protamine and the aortic and venous cannulas removed. Hemostasis was achieved. Mediastinal drainage tubes were placed. The sternum was closed with  #6 stainless steel wires. The fascia was closed with continuous # 1 vicryl suture. The subcutaneous tissue was closed with 2-0 vicryl continuous suture. The skin was closed with 3-0 vicryl subcuticular suture. All sponge, needle, and instrument counts were reported correct at the end of the case. Dry sterile dressings were placed over the incisions and around the chest tubes which were connected to pleurevac suction. The patient was then transported to the surgical intensive care unit in stable condition.

## 2022-06-16 NOTE — Hospital Course (Addendum)
PCP is [name], DO Referring Provider is Lenna Sciara, MD Primary Cardiologist is Kirk Ruths, MD  History of Present Illness: The patient is a 72 year old woman with a history of hyperlipidemia, smoking and COPD, hypothyroidism, and moderate to severe aortic stenosis who reports progressive exertional fatigue and shortness of breath over the past year with recent episodes of substernal chest discomfort at rest and with exertion as well as episodes of dizziness.  She was recently seen in the emergency department on 06/01/2022 due to chest discomfort and dizziness.  She says she felt like she was going to pass out.  High-sensitivity troponin was negative.  There were no significant ECG changes.  A repeat echocardiogram showed a functionally bicuspid aortic valve with markedly thickened leaflets.  There is mild calcification.  The mean gradient was measured at 33.5 mmHg with a dimensionless index of 0.34 with mild to moderate aortic insufficiency.  Left ventricular ejection fraction was 65 to 70% with mild LVH and grade 1 diastolic dysfunction.  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  Right heart pressures were normal with a cardiac index of 2.8.  She was seen by Dr. Ali Lowe for consideration of TAVR.  A gated cardiac CTA showed a trileaflet aortic valve with severely thickened and mild to moderately calcified aortic valve cusps.  The aortic valve calcium score was only 319.   Since discharge she reports that she has continued to have exertional fatigue and shortness of breath.  She has had some twinges of chest discomfort with and without exertion.  She denies any orthopnea or PND.  She has continued to smoke about half pack of cigarettes per day but is smoked more in the past.  Course in Hospital: [name] was admitted to the hospital for elective surgery on 06/16/2022.  She was taken to the operating  room where aortic valve replacement was accomplished utilizing an Edwards Inspiris 21 mm bovine pericardial tissue valve.  Following the procedure, she separated from cardiopulmonary bypass without any inotropic support.  She was transferred to the surgical ICU in stable condition. She remained hemodynamically stable. She was weaned from the ventilator routinely and extubated without difficulty. Monitoring lines and chest tube were removed on the first post-op day. She was noted to have an coarse productive cough early post-op and was started on IV Maxipime empirically while also encouraging mobilization and pulmonary hygiene.   She developed atrial fibrillation during the night on post-op day 2 and was treated with IV amiodarone loading. She promptly converted back to SR.  She was diuresed for expected post-op volume excess with appropriate response. She remained on Neo Synephrine and was started on Midodrine on 03/16 for BP support.

## 2022-06-16 NOTE — Progress Notes (Signed)
  Echocardiogram Echocardiogram Transesophageal has been performed.  Bobbye Charleston 06/16/2022, 8:53 AM

## 2022-06-16 NOTE — Anesthesia Procedure Notes (Signed)
Arterial Line Insertion Start/End3/03/2023 7:20 AM, 06/16/2022 7:26 AM Performed by: Darral Dash, DO, anesthesiologist  Patient location: Pre-op. Preanesthetic checklist: patient identified, IV checked, site marked, risks and benefits discussed, surgical consent, monitors and equipment checked, pre-op evaluation, timeout performed and anesthesia consent Lidocaine 1% used for infiltration Right, brachial was placed Catheter size: 20 G Hand hygiene performed , maximum sterile barriers used  and Seldinger technique used  Attempts: 1 Procedure performed using ultrasound guided technique. Ultrasound Notes:anatomy identified, needle tip was noted to be adjacent to the nerve/plexus identified, no ultrasound evidence of intravascular and/or intraneural injection and image(s) printed for medical record Following insertion, dressing applied and Biopatch. Post procedure assessment: normal and unchanged  Post procedure complications: local hematoma, unsuccessful attempts and second provider assisted. Patient tolerated the procedure well with no immediate complications.

## 2022-06-16 NOTE — Anesthesia Procedure Notes (Signed)
Procedure Name: Intubation Date/Time: 06/16/2022 7:56 AM  Performed by: Glynda Jaeger, CRNAPre-anesthesia Checklist: Patient identified, Emergency Drugs available, Suction available and Patient being monitored Patient Re-evaluated:Patient Re-evaluated prior to induction Oxygen Delivery Method: Circle System Utilized Preoxygenation: Pre-oxygenation with 100% oxygen Induction Type: IV induction Ventilation: Mask ventilation without difficulty and Oral airway inserted - appropriate to patient size Laryngoscope Size: Mac and 3 Grade View: Grade I Tube type: Oral Tube size: 8.0 mm Number of attempts: 1 Airway Equipment and Method: Stylet and Oral airway Placement Confirmation: ETT inserted through vocal cords under direct vision, positive ETCO2 and breath sounds checked- equal and bilateral Secured at: 21 cm Tube secured with: Tape Dental Injury: Teeth and Oropharynx as per pre-operative assessment

## 2022-06-16 NOTE — Transfer of Care (Signed)
Immediate Anesthesia Transfer of Care Note  Patient: Carolyn [name]  Procedure(s) Performed: AORTIC VALVE REPLACEMENT (AVR) USING EDWARDS INSPIRIS SIZE 21MM (Chest) TRANSESOPHAGEAL ECHOCARDIOGRAM  Patient Location: ICU  Anesthesia Type:General  Level of Consciousness: Patient remains intubated per anesthesia plan  Airway & Oxygen Therapy: Patient remains intubated per anesthesia plan and Patient placed on Ventilator (see vital sign flow sheet for setting)  Post-op Assessment: Report given to RN and Post -op Vital signs reviewed and stable  Post vital signs: Reviewed and stable  Last Vitals:  Vitals Value Taken Time  BP 105/64 1152  Temp    Pulse    Resp    SpO2      Last Pain:  Vitals:   06/16/22 0553  TempSrc:   PainSc: 0-No pain      Patients Stated Pain Goal: 0 ([id number])  Complications: No notable events documented.

## 2022-06-16 NOTE — Interval H&P Note (Signed)
History and Physical Interval Note:  06/16/2022 6:33 AM  Carolyn [name]  has presented today for surgery, with the diagnosis of SEVERE AS.  The various methods of treatment have been discussed with the patient and family. After consideration of risks, benefits and other options for treatment, the patient has consented to  Procedure(s): AORTIC VALVE REPLACEMENT (AVR) (N/A) TRANSESOPHAGEAL ECHOCARDIOGRAM (N/A) as a surgical intervention.  The patient's history has been reviewed, patient examined, no change in status, stable for surgery.  I have reviewed the patient's chart and labs.  Questions were answered to the patient's satisfaction.     [name]

## 2022-06-16 NOTE — Brief Op Note (Signed)
06/16/2022  10:28 AM  PATIENT:  Carolyn [name]  72 y.o. female  PRE-OPERATIVE DIAGNOSIS:  SEVERE AORTIC STENOSIS  POST-OPERATIVE DIAGNOSIS:  SEVERE AORTIC STENOSIS  PROCEDURE:   AORTIC VALVE REPLACEMENT (AVR) USING EDWARDS INSPIRIS SIZE 21MM PERICARDIAL TISSUE VALVE  TRANSESOPHAGEAL ECHOCARDIOGRAM   SURGEON:  [name], MD - Primary  PHYSICIAN ASSISTANT: Byrant Valent  ASSISTANTS: Buzzy Han, RN, RN First Assistant                     Jaci Standard, Concha Pyo, RN, REGISTERED NURSE  ANESTHESIA:   general  EBL:  174m  BLOOD ADMINISTERED:none  DRAINS:  Mediastinal drains x 2    LOCAL MEDICATIONS USED:  NONE  COUNTS:  Correct  DICTATION: .Dragon Dictation  PLAN OF CARE: Admit to inpatient   PATIENT DISPOSITION:  ICU - intubated and hemodynamically stable.   Delay start of Pharmacological VTE agent (>24hrs) due to surgical blood loss or risk of bleeding: yes

## 2022-06-16 NOTE — Anesthesia Procedure Notes (Signed)
Central Venous Catheter Insertion Performed by: Darral Dash, DO, anesthesiologist Start/End3/03/2023 7:00 AM, 06/16/2022 7:10 AM Patient location: Pre-op. Preanesthetic checklist: patient identified, IV checked, site marked, risks and benefits discussed, surgical consent, monitors and equipment checked, pre-op evaluation, timeout performed and anesthesia consent Position: Trendelenburg Lidocaine 1% used for infiltration and patient sedated Hand hygiene performed  and maximum sterile barriers used  Catheter size: 8.5 Fr Central line was placed.Sheath introducer Procedure performed using ultrasound guided technique. Ultrasound Notes:anatomy identified, needle tip was noted to be adjacent to the nerve/plexus identified, no ultrasound evidence of intravascular and/or intraneural injection and image(s) printed for medical record Attempts: 1 Following insertion, line sutured, dressing applied and Biopatch. Post procedure assessment: blood return through all ports, free fluid flow and no air  Patient tolerated the procedure well with no immediate complications.

## 2022-06-16 NOTE — Progress Notes (Signed)
ABG post wean, pre-extubation results as follows:   pH: 7.29  pCO2: 48.5  Acid-base deficit: 3.0  Bicarb: 23.8 Per verbal order from Dr. Cyndia Bent, gave 1 50 mEq bicarb and extubated.   1 hr post extubation ABG resulted as follows:   pH: 7.31   pCO2: 52.2   Per verbal from Dr. Cyndia Bent, waited 1 hr and collected another ABG. Results as follows:   pH: 7.31  pCO2: 48.3  Krystal Eaton, RN

## 2022-06-17 ENCOUNTER — Encounter (HOSPITAL_COMMUNITY): Payer: Self-pay | Admitting: Surgery

## 2022-06-17 ENCOUNTER — Inpatient Hospital Stay (HOSPITAL_COMMUNITY): Payer: No Typology Code available for payment source

## 2022-06-17 DIAGNOSIS — J90 Pleural effusion, not elsewhere classified: Secondary | ICD-10-CM | POA: Diagnosis not present

## 2022-06-17 DIAGNOSIS — Z006 Encounter for examination for normal comparison and control in clinical research program: Secondary | ICD-10-CM | POA: Diagnosis not present

## 2022-06-17 DIAGNOSIS — I352 Nonrheumatic aortic (valve) stenosis with insufficiency: Secondary | ICD-10-CM | POA: Diagnosis not present

## 2022-06-17 DIAGNOSIS — E871 Hypo-osmolality and hyponatremia: Secondary | ICD-10-CM | POA: Diagnosis not present

## 2022-06-17 LAB — BASIC METABOLIC PANEL
Anion gap: 8 (ref 5–15)
Anion gap: 6 (ref 5–15)
Anion gap: 5 (ref 5–15)
BUN: 7 mg/dL — ABNORMAL LOW (ref 8–23)
BUN: 8 mg/dL (ref 8–23)
BUN: 9 mg/dL (ref 8–23)
CO2: 24 mmol/L (ref 22–32)
CO2: 24 mmol/L (ref 22–32)
CO2: 22 mmol/L (ref 22–32)
Calcium: 8.1 mg/dL — ABNORMAL LOW (ref 8.9–10.3)
Calcium: 8 mg/dL — ABNORMAL LOW (ref 8.9–10.3)
Calcium: 7.4 mg/dL — ABNORMAL LOW (ref 8.9–10.3)
Chloride: 108 mmol/L (ref 98–111)
Chloride: 104 mmol/L (ref 98–111)
Chloride: 102 mmol/L (ref 98–111)
Creatinine, Ser: 0.76 mg/dL (ref 0.44–1.00)
Creatinine, Ser: 0.73 mg/dL (ref 0.44–1.00)
Creatinine, Ser: 0.75 mg/dL (ref 0.44–1.00)
GFR, Estimated: >60 mL/min (ref >60)
GFR, Estimated: >60 mL/min (ref >60)
GFR, Estimated: >60 mL/min (ref >60)
Glucose, Bld: 110 mg/dL — ABNORMAL HIGH (ref 70–99)
Glucose, Bld: 103 mg/dL — ABNORMAL HIGH (ref 70–99)
Glucose, Bld: 109 mg/dL — ABNORMAL HIGH (ref 70–99)
Potassium: 4.2 mmol/L (ref 3.5–5.1)
Potassium: 4 mmol/L (ref 3.5–5.1)
Potassium: 3.8 mmol/L (ref 3.5–5.1)
Sodium: 140 mmol/L (ref 135–145)
Sodium: 134 mmol/L — ABNORMAL LOW (ref 135–145)
Sodium: 129 mmol/L — ABNORMAL LOW (ref 135–145)

## 2022-06-17 LAB — GLUCOSE, CAPILLARY
Glucose-Capillary: 101 mg/dL — ABNORMAL HIGH (ref 70–99)
Glucose-Capillary: 101 mg/dL — ABNORMAL HIGH (ref 70–99)
Glucose-Capillary: 111 mg/dL — ABNORMAL HIGH (ref 70–99)
Glucose-Capillary: 112 mg/dL — ABNORMAL HIGH (ref 70–99)
Glucose-Capillary: 113 mg/dL — ABNORMAL HIGH (ref 70–99)
Glucose-Capillary: 116 mg/dL — ABNORMAL HIGH (ref 70–99)
Glucose-Capillary: 121 mg/dL — ABNORMAL HIGH (ref 70–99)
Glucose-Capillary: 124 mg/dL — ABNORMAL HIGH (ref 70–99)
Glucose-Capillary: 130 mg/dL — ABNORMAL HIGH (ref 70–99)
Glucose-Capillary: 83 mg/dL (ref 70–99)
Glucose-Capillary: 89 mg/dL (ref 70–99)
Glucose-Capillary: 96 mg/dL (ref 70–99)

## 2022-06-17 LAB — CBC
HCT: 29 % — ABNORMAL LOW (ref 36.0–46.0)
HCT: 29.9 % — ABNORMAL LOW (ref 36.0–46.0)
Hemoglobin: 9.7 g/dL — ABNORMAL LOW (ref 12.0–15.0)
Hemoglobin: 10.3 g/dL — ABNORMAL LOW (ref 12.0–15.0)
MCH: 30.1 pg (ref 26.0–34.0)
MCH: 30.9 pg (ref 26.0–34.0)
MCHC: 33.4 g/dL (ref 30.0–36.0)
MCHC: 34.4 g/dL (ref 30.0–36.0)
MCV: 90.1 fL (ref 80.0–100.0)
MCV: 89.8 fL (ref 80.0–100.0)
Platelets: 127 10*3/uL — ABNORMAL LOW (ref 150–400)
Platelets: 131 10*3/uL — ABNORMAL LOW (ref 150–400)
RBC: 3.22 MIL/uL — ABNORMAL LOW (ref 3.87–5.11)
RBC: 3.33 MIL/uL — ABNORMAL LOW (ref 3.87–5.11)
RDW: 14.1 % (ref 11.5–15.5)
RDW: 13.9 % (ref 11.5–15.5)
WBC: 9.3 10*3/uL (ref 4.0–10.5)
WBC: 10.6 10*3/uL — ABNORMAL HIGH (ref 4.0–10.5)
nRBC: 0 % (ref 0.0–0.2)
nRBC: 0 % (ref 0.0–0.2)

## 2022-06-17 LAB — SURGICAL PATHOLOGY

## 2022-06-17 LAB — MAGNESIUM
Magnesium: 3.1 mg/dL — ABNORMAL HIGH (ref 1.7–2.4)
Magnesium: 2.2 mg/dL (ref 1.7–2.4)
Magnesium: 1.8 mg/dL (ref 1.7–2.4)

## 2022-06-17 MED ORDER — ALBUTEROL SULFATE (2.5 MG/3ML) 0.083% IN NEBU
2.5000 mg | INHALATION_SOLUTION | Freq: Four times a day (QID) | RESPIRATORY_TRACT | Status: DC
  Administered 2022-06-17 – 2022-06-19 (×7): 2.5 mg via RESPIRATORY_TRACT
  Filled 2022-06-17 (×7): qty 3

## 2022-06-17 MED ORDER — ENOXAPARIN SODIUM 40 MG/0.4ML IJ SOSY
40.0000 mg | PREFILLED_SYRINGE | Freq: Every day | INTRAMUSCULAR | Status: DC
  Administered 2022-06-17 – 2022-06-23 (×7): 40 mg via SUBCUTANEOUS
  Filled 2022-06-17 (×7): qty 0.4

## 2022-06-17 MED ORDER — SODIUM CHLORIDE 0.9 % IV SOLN: INTRAVENOUS | Status: DC | PRN

## 2022-06-17 MED ORDER — POTASSIUM CHLORIDE CRYS ER 20 MEQ PO TBCR
20.0000 meq | EXTENDED_RELEASE_TABLET | ORAL | Status: AC
  Administered 2022-06-17 – 2022-06-18 (×3): 20 meq via ORAL
  Filled 2022-06-17 (×2): qty 1

## 2022-06-17 MED ORDER — INSULIN ASPART 100 UNIT/ML IJ SOLN
0.0000 [IU] | INTRAMUSCULAR | Status: DC
  Administered 2022-06-17 (×3): 2 [IU] via SUBCUTANEOUS

## 2022-06-17 MED ORDER — ALBUTEROL SULFATE (2.5 MG/3ML) 0.083% IN NEBU: 2.5000 mg | INHALATION_SOLUTION | Freq: Four times a day (QID) | RESPIRATORY_TRACT | Status: DC

## 2022-06-17 MED ORDER — GUAIFENESIN ER 600 MG PO TB12
600.0000 mg | ORAL_TABLET | Freq: Two times a day (BID) | ORAL | Status: DC
  Administered 2022-06-17 – 2022-06-24 (×15): 600 mg via ORAL
  Filled 2022-06-17 (×16): qty 1

## 2022-06-17 NOTE — Progress Notes (Signed)
EVENING ROUNDS NOTE :     [address]       [place] [address]             [phone]                 1 Day Post-Op Procedure(s) (LRB): AORTIC VALVE REPLACEMENT (AVR) USING EDWARDS INSPIRIS SIZE 21MM (N/A) TRANSESOPHAGEAL ECHOCARDIOGRAM (N/A)   Total Length of Stay:  LOS: 1 day  Events:   No events On some neo Up to chair    BP 107/88   Pulse 85   Temp 99.6 F (37.6 C) (Oral)   Resp (!) 26   Ht '5\' 5"'$  (1.651 m)   Wt 80.1 kg   SpO2 (!) 87%   BMI 29.39 kg/m   PAP: (24-69)/(12-44) 43/28 CO:  [4.2 L/min-5.2 L/min] 4.2 L/min CI:  [2.3 L/min/m2-2.8 L/min/m2] 2.3 L/min/m2      sodium chloride     sodium chloride     sodium chloride 10 mL/hr at 06/17/22 1600    ceFAZolin (ANCEF) IV Stopped (06/17/22 1459)   lactated ringers     lactated ringers 20 mL/hr at 06/17/22 1600   phenylephrine (NEO-SYNEPHRINE) Adult infusion 40 mcg/min (06/17/22 1600)    I/O last 3 completed shifts: In: 4417.8 [I.V.:2884.2; Blood:220; IV Piggyback:1313.7] Out: 3229 [Urine:2855; Chest Tube:374]      Latest Ref Rng & Units 06/17/2022    4:42 PM 06/17/2022    4:47 AM 06/16/2022    6:42 PM  CBC  WBC 4.0 - 10.5 K/uL 10.6  9.3    Hemoglobin 12.0 - 15.0 g/dL 10.3  9.7  10.5   Hematocrit 36.0 - 46.0 % 29.9  29.0  31.0   Platelets 150 - 400 K/uL 131  127         Latest Ref Rng & Units 06/17/2022    4:42 PM 06/17/2022    4:47 AM 06/16/2022    6:42 PM  BMP  Glucose 70 - 99 mg/dL 109  103    BUN 8 - 23 mg/dL 9  8    Creatinine 0.44 - 1.00 mg/dL 0.75  0.73    Sodium 135 - 145 mmol/L 129  134  140   Potassium 3.5 - 5.1 mmol/L 3.8  4.0  4.2   Chloride 98 - 111 mmol/L 102  104    CO2 22 - 32 mmol/L 22  24    Calcium 8.9 - 10.3 mg/dL 7.4  8.0      ABG    Component Value Date/Time   PHART 7.317 (L) 06/16/2022 1842   PCO2ART 48.3 (H) 06/16/2022 1842   PO2ART 74 (L) 06/16/2022 1842   HCO3 24.6 06/16/2022 1842   TCO2 26 06/16/2022 1842   ACIDBASEDEF 2.0 06/16/2022 1842    O2SAT 92 06/16/2022 1842       Teagon Kron, MD 06/17/2022 6:14 PM

## 2022-06-17 NOTE — Progress Notes (Signed)
1 Day Post-Op Procedure(s) (LRB): AORTIC VALVE REPLACEMENT (AVR) USING EDWARDS INSPIRIS SIZE 21MM (N/A) TRANSESOPHAGEAL ECHOCARDIOGRAM (N/A) Subjective: No specific complaints. Wants to get out of bed.  Objective: Vital signs in last 24 hours: Temp:  [96.4 F (35.8 C)-101.3 F (38.5 C)] 99.7 F (37.6 C) (03/13 0810) Pulse Rate:  [74-93] 82 (03/13 0810) Cardiac Rhythm: Normal sinus rhythm (03/12 2000) Resp:  [11-34] 19 (03/13 0810) BP: (84-106)/(51-74) 98/65 (03/13 0800) SpO2:  [74 %-100 %] 94 % (03/13 0810) Arterial Line BP: (84-130)/(46-73) 84/55 (03/13 0530) FiO2 (%):  [40 %-50 %] 40 % (03/12 1549) Weight:  [80.1 kg] 80.1 kg (03/13 0500)  Hemodynamic parameters for last 24 hours: PAP: (24-69)/(12-44) 32/14 CO:  [3.5 L/min-5.2 L/min] 4.2 L/min CI:  [1.8 L/min/m2-2.8 L/min/m2] 2.3 L/min/m2  Intake/Output from previous day: 03/12 0701 - 03/13 0700 In: 4417.8 [I.V.:2884.2; Blood:220; IV Piggyback:1313.7] Out: 3229 [Urine:2855; Chest Tube:374] Intake/Output this shift: No intake/output data recorded.  General appearance: alert and cooperative Neurologic: intact Heart: regular rate and rhythm, S1, S2 normal, no murmur Lungs: rhonchi bilaterally Extremities: edema moderate Wound: dressing dry  Lab Results: Recent Labs    06/16/22 1742 06/16/22 1842 06/17/22 0447  WBC 9.8  --  9.3  HGB 10.8*  10.2* 10.5* 9.7*  HCT 32.1*  30.0* 31.0* 29.0*  PLT 131*  --  127*   BMET:  Recent Labs    06/16/22 1742 06/16/22 1842 06/17/22 0447  NA 140  141 140 134*  K 4.2  4.2 4.2 4.0  CL 108  --  104  CO2 24  --  24  GLUCOSE 110*  --  103*  BUN 7*  --  8  CREATININE 0.76  --  0.73  CALCIUM 8.1*  --  8.0*    PT/INR:  Recent Labs    06/16/22 1205  LABPROT 17.8*  INR 1.5*   ABG    Component Value Date/Time   PHART 7.317 (L) 06/16/2022 1842   HCO3 24.6 06/16/2022 1842   TCO2 26 06/16/2022 1842   ACIDBASEDEF 2.0 06/16/2022 1842   O2SAT 92 06/16/2022 1842   CBG  (last 3)  Recent Labs    06/17/22 0451 06/17/22 0618 06/17/22 0733  GLUCAP 113* 83 89   CXR: chronic interstitial changes, atelectasis bilat  ECG: sinus, non-specific changes  Assessment/Plan: S/P Procedure(s) (LRB): AORTIC VALVE REPLACEMENT (AVR) USING EDWARDS INSPIRIS SIZE 21MM (N/A) TRANSESOPHAGEAL ECHOCARDIOGRAM (N/A)  POD 1  Hemodynamically stable in sinus rhythm but still on some neo. Wean as tolerated. Hold beta blocker.  Ongoing heavy smoking and COPD. Resume bronchodilators, IS, flutter valve. Mucinex.  Glucose under good control. No hx of DM and Hgb A1c 5.8. DC insulin drip and follow CBG's with SSI.  Dangle and DC chest tubes.  DC swan. She pulled A-line out last night.  OOB, mobilize.   LOS: 1 day    [name] 06/17/2022

## 2022-06-17 NOTE — Care Management (Signed)
  Transition of Care [facility]) Screening Note   Patient Details  Name: Carolyn [name] Date of Birth: May 10, 1950   Transition of Care [facility]) CM/SW Contact:    Bethena Roys, RN Phone Number: 06/17/2022, 11:44 AM    Transition of Care Department [facility]) has reviewed the patient and no TOC needs have been identified at this time. We will continue to monitor patient advancement through interdisciplinary progression rounds. If new patient transition needs arise, please place a TOC consult.

## 2022-06-18 ENCOUNTER — Encounter (HOSPITAL_COMMUNITY): Payer: Self-pay | Admitting: Surgery

## 2022-06-18 ENCOUNTER — Other Ambulatory Visit: Payer: Self-pay

## 2022-06-18 ENCOUNTER — Inpatient Hospital Stay (HOSPITAL_COMMUNITY): Payer: No Typology Code available for payment source

## 2022-06-18 ENCOUNTER — Ambulatory Visit: Payer: No Typology Code available for payment source | Admitting: Sports Medicine

## 2022-06-18 ENCOUNTER — Other Ambulatory Visit: Payer: Self-pay | Admitting: Student

## 2022-06-18 DIAGNOSIS — Z952 Presence of prosthetic heart valve: Secondary | ICD-10-CM

## 2022-06-18 DIAGNOSIS — I350 Nonrheumatic aortic (valve) stenosis: Secondary | ICD-10-CM

## 2022-06-18 DIAGNOSIS — I352 Nonrheumatic aortic (valve) stenosis with insufficiency: Secondary | ICD-10-CM | POA: Diagnosis not present

## 2022-06-18 DIAGNOSIS — J90 Pleural effusion, not elsewhere classified: Secondary | ICD-10-CM | POA: Diagnosis not present

## 2022-06-18 DIAGNOSIS — Z006 Encounter for examination for normal comparison and control in clinical research program: Secondary | ICD-10-CM | POA: Diagnosis not present

## 2022-06-18 DIAGNOSIS — E871 Hypo-osmolality and hyponatremia: Secondary | ICD-10-CM | POA: Diagnosis not present

## 2022-06-18 LAB — BASIC METABOLIC PANEL
Anion gap: 8 (ref 5–15)
BUN: 11 mg/dL (ref 8–23)
CO2: 23 mmol/L (ref 22–32)
Calcium: 8.3 mg/dL — ABNORMAL LOW (ref 8.9–10.3)
Chloride: 97 mmol/L — ABNORMAL LOW (ref 98–111)
Creatinine, Ser: 0.84 mg/dL (ref 0.44–1.00)
GFR, Estimated: >60 mL/min (ref >60)
Glucose, Bld: 107 mg/dL — ABNORMAL HIGH (ref 70–99)
Potassium: 4.3 mmol/L (ref 3.5–5.1)
Sodium: 128 mmol/L — ABNORMAL LOW (ref 135–145)

## 2022-06-18 LAB — CBC
HCT: 28.4 % — ABNORMAL LOW (ref 36.0–46.0)
Hemoglobin: 9.4 g/dL — ABNORMAL LOW (ref 12.0–15.0)
MCH: 30.2 pg (ref 26.0–34.0)
MCHC: 33.1 g/dL (ref 30.0–36.0)
MCV: 91.3 fL (ref 80.0–100.0)
Platelets: 110 10*3/uL — ABNORMAL LOW (ref 150–400)
RBC: 3.11 MIL/uL — ABNORMAL LOW (ref 3.87–5.11)
RDW: 13.9 % (ref 11.5–15.5)
WBC: 8.3 10*3/uL (ref 4.0–10.5)
nRBC: 0 % (ref 0.0–0.2)

## 2022-06-18 LAB — GLUCOSE, CAPILLARY: Glucose-Capillary: 115 mg/dL — ABNORMAL HIGH (ref 70–99)

## 2022-06-18 MED ORDER — POTASSIUM CHLORIDE CRYS ER 20 MEQ PO TBCR
20.0000 meq | EXTENDED_RELEASE_TABLET | Freq: Two times a day (BID) | ORAL | Status: AC
  Administered 2022-06-18 (×2): 20 meq via ORAL
  Filled 2022-06-18 (×2): qty 1

## 2022-06-18 MED ORDER — AMIODARONE LOAD VIA INFUSION
150.0000 mg | Freq: Once | INTRAVENOUS | Status: AC
  Administered 2022-06-18: 150 mg via INTRAVENOUS
  Filled 2022-06-18: qty 83.34

## 2022-06-18 MED ORDER — AMIODARONE HCL IN DEXTROSE 360-4.14 MG/200ML-% IV SOLN
60.0000 mg/h | INTRAVENOUS | Status: AC
  Administered 2022-06-18 – 2022-06-19 (×2): 60 mg/h via INTRAVENOUS
  Filled 2022-06-18 (×2): qty 200

## 2022-06-18 MED ORDER — SODIUM CHLORIDE 0.9 % IV SOLN
2.0000 g | Freq: Three times a day (TID) | INTRAVENOUS | Status: DC
  Administered 2022-06-18 – 2022-06-22 (×15): 2 g via INTRAVENOUS
  Filled 2022-06-18 (×16): qty 12.5

## 2022-06-18 MED ORDER — AMIODARONE HCL IN DEXTROSE 360-4.14 MG/200ML-% IV SOLN
30.0000 mg/h | INTRAVENOUS | Status: DC
  Administered 2022-06-19 (×2): 30 mg/h via INTRAVENOUS
  Filled 2022-06-18 (×2): qty 200

## 2022-06-18 MED ORDER — FUROSEMIDE 10 MG/ML IJ SOLN
40.0000 mg | Freq: Two times a day (BID) | INTRAMUSCULAR | Status: AC
  Administered 2022-06-18 (×2): 40 mg via INTRAVENOUS
  Filled 2022-06-18 (×2): qty 4

## 2022-06-18 MED FILL — Sodium Chloride IV Soln 0.9%: INTRAVENOUS | Qty: 2000 | Status: AC

## 2022-06-18 MED FILL — Mannitol IV Soln 20%: INTRAVENOUS | Qty: 500 | Status: AC

## 2022-06-18 MED FILL — Sodium Bicarbonate IV Soln 8.4%: INTRAVENOUS | Qty: 50 | Status: AC

## 2022-06-18 MED FILL — Electrolyte-R (PH 7.4) Solution: INTRAVENOUS | Qty: 4000 | Status: AC

## 2022-06-18 MED FILL — Heparin Sodium (Porcine) Inj 1000 Unit/ML: INTRAMUSCULAR | Qty: 10 | Status: AC

## 2022-06-18 NOTE — Progress Notes (Signed)
2 Days Post-Op Procedure(s) (LRB): AORTIC VALVE REPLACEMENT (AVR) USING EDWARDS INSPIRIS SIZE 21MM (N/A) TRANSESOPHAGEAL ECHOCARDIOGRAM (N/A) Subjective: No complaints.  Ambulated two laps yesterday and ambulated this am. Coughing up some tan thick sputum.  Objective: Vital signs in last 24 hours: Temp:  [98.2 F (36.8 C)-99.9 F (37.7 C)] 99.9 F (37.7 C) (03/14 0615) Pulse Rate:  [74-300] 85 (03/14 0615) Cardiac Rhythm: Normal sinus rhythm (03/14 0400) Resp:  [11-34] 20 (03/14 0615) BP: (67-131)/(42-98) 116/65 (03/14 0615) SpO2:  [51 %-97 %] 89 % (03/14 0615) Weight:  [82.5 kg] 82.5 kg (03/14 0500)  Hemodynamic parameters for last 24 hours: PAP: (32-43)/(14-28) 43/28 CO:  [4.2 L/min] 4.2 L/min CI:  [2.3 L/min/m2] 2.3 L/min/m2  Intake/Output from previous day: 03/13 0701 - 03/14 0700 In: 2472.3 [P.O.:720; I.V.:1452.3; IV Piggyback:300] Out: 1875 [Urine:1865; Chest Tube:10] Intake/Output this shift: No intake/output data recorded.  General appearance: alert and cooperative Neurologic: intact Heart: regular rate and rhythm, S1, S2 normal, no murmur Lungs: rhonchi bilaterally Extremities: edema mild Wound: dressing dry  Lab Results: Recent Labs    06/17/22 1642 06/18/22 0349  WBC 10.6* 8.3  HGB 10.3* 9.4*  HCT 29.9* 28.4*  PLT 131* 110*   BMET:  Recent Labs    06/17/22 1642 06/18/22 0349  NA 129* 128*  K 3.8 4.3  CL 102 97*  CO2 22 23  GLUCOSE 109* 107*  BUN 9 11  CREATININE 0.75 0.84  CALCIUM 7.4* 8.3*    PT/INR:  Recent Labs    06/16/22 1205  LABPROT 17.8*  INR 1.5*   ABG    Component Value Date/Time   PHART 7.317 (L) 06/16/2022 1842   HCO3 24.6 06/16/2022 1842   TCO2 26 06/16/2022 1842   ACIDBASEDEF 2.0 06/16/2022 1842   O2SAT 92 06/16/2022 1842   CBG (last 3)  Recent Labs    06/17/22 2048 06/17/22 2333 06/18/22 0347  GLUCAP 124* 130* 115*   CXR: may be developing infiltrate in RLL.  Assessment/Plan: S/P Procedure(s)  (LRB): AORTIC VALVE REPLACEMENT (AVR) USING EDWARDS INSPIRIS SIZE 21MM (N/A) TRANSESOPHAGEAL ECHOCARDIOGRAM (N/A)  POD 2 Hemodynamically stable and off neo this am. Hold Lopressor until sure BP is stable off neo.  Volume excess: start diuresis.  DC CBG's and SSI  Low grade temp to 99.9 and CXR with new density in RLL, rhonchi on exam and coughing up sputum in heavy smoker. Will start on empiric Maxipime.  Continue IS, flutter valve and ambulation.   LOS: 2 days    [name] 06/18/2022

## 2022-06-18 NOTE — Progress Notes (Signed)
[address]       [place] [address]             [phone]      2 Days Post-Op  Procedure(s) (LRB): AORTIC VALVE REPLACEMENT (AVR) USING EDWARDS INSPIRIS SIZE 21MM (N/A) TRANSESOPHAGEAL ECHOCARDIOGRAM (N/A)   Total Length of Stay:  LOS: 2 days    SUBJECTIVE: No new concerns. Has coarse cough and is working on [employer] with incentive spirometer and flutter valve. Walked 3 times in the hall today.   O2 sat 93% on 2L [place] O2.  Vitals:   06/18/22 1600 06/18/22 1615  BP: (!) 95/48   Pulse: 85 86  Resp: 17 (!) 23  Temp:    SpO2: 93% 92%    Intake/Output      03/13 0701 03/14 0700 03/14 0701 03/15 0700   P.O. 720    I.V. (mL/kg) 1452.3 (17.6) 162.8 (2)   Blood     IV Piggyback 300 200   Total Intake(mL/kg) 2472.3 (30) 362.8 (4.4)   Urine (mL/kg/hr) 1865 (0.9) 1260 (1.5)   Chest Tube 10    Total Output 1875 1260   Net +597.3 -897.2            sodium chloride     sodium chloride     sodium chloride 10 mL/hr at 06/18/22 1100   sodium chloride Stopped (06/17/22 2237)   ceFEPime (MAXIPIME) IV 2 g (06/18/22 1615)   lactated ringers     lactated ringers 20 mL/hr at 06/18/22 1100   phenylephrine (NEO-SYNEPHRINE) Adult infusion Stopped (06/18/22 0651)    CBC    Component Value Date/Time   WBC 8.3 06/18/2022 0349   RBC 3.11 (L) 06/18/2022 0349   HGB 9.4 (L) 06/18/2022 0349   HGB 14.7 10/14/2018 1114   HCT 28.4 (L) 06/18/2022 0349   HCT 43.0 10/14/2018 1114   PLT 110 (L) 06/18/2022 0349   PLT 212 10/14/2018 1114   MCV 91.3 06/18/2022 0349   MCV 89 10/14/2018 1114   MCH 30.2 06/18/2022 0349   MCHC 33.1 06/18/2022 0349   RDW 13.9 06/18/2022 0349   RDW 12.8 10/14/2018 1114   LYMPHSABS 1,348 05/18/2022 1151   LYMPHSABS 1.4 10/14/2018 1114   MONOABS 0.6 10/28/2006 1354   EOSABS 99 05/18/2022 1151   EOSABS 0.0 10/14/2018 1114   BASOSABS 72 05/18/2022 1151   BASOSABS 0.1 10/14/2018 1114   CMP     Component Value Date/Time   NA  128 (L) 06/18/2022 0349   NA 131 (L) 10/14/2018 1114   K 4.3 06/18/2022 0349   CL 97 (L) 06/18/2022 0349   CO2 23 06/18/2022 0349   GLUCOSE 107 (H) 06/18/2022 0349   BUN 11 06/18/2022 0349   BUN 11 10/14/2018 1114   CREATININE 0.84 06/18/2022 0349   CREATININE 0.70 05/18/2022 1151   CALCIUM 8.3 (L) 06/18/2022 0349   PROT 6.2 (L) 06/12/2022 1430   PROT 6.8 10/14/2018 1114   ALBUMIN 3.5 06/12/2022 1430   ALBUMIN 4.6 10/14/2018 1114   AST 25 06/12/2022 1430   ALT 25 06/12/2022 1430   ALKPHOS 67 06/12/2022 1430   BILITOT 0.4 06/12/2022 1430   BILITOT 0.5 10/14/2018 1114   GFRNONAA >60 06/18/2022 0349   GFRAA 88 10/14/2018 1114   ABG    Component Value Date/Time   PHART 7.317 (L) 06/16/2022 1842   PCO2ART 48.3 (H) 06/16/2022 1842   PO2ART 74 (L) 06/16/2022 1842   HCO3 24.6 06/16/2022  1842   TCO2 26 06/16/2022 1842   ACIDBASEDEF 2.0 06/16/2022 1842   O2SAT 92 06/16/2022 1842   CBG (last 3)  Recent Labs    06/17/22 2048 06/17/22 2333 06/18/22 0347  GLUCAP 124* 130* 115*     ASSESSMENT: Progressing POD2 tissue AVR. On empiric Maxipime for coarse cough and tan sputum production with h/o COPD and tobacco use. Continue to encourage pulm hygiene. Diuresing.    Antony Odea, PA-C 06/18/2022

## 2022-06-18 NOTE — Progress Notes (Signed)
Ordered 6 week post-op Echo following AVR per protocol. Primary Cardiologist: Dr. Stanford Breed. CT Surgeon: Dr. Cyndia Bent.  Darreld Mclean, PA-C 06/18/2022 8:12 AM

## 2022-06-18 NOTE — Progress Notes (Signed)
Pharmacy Antibiotic Note  Carolyn [name] is a 72 y.o. female admitted on 06/16/2022 for prosthetic aortic valve procedure s/p 2 days. Pharmacy has been consulted for cefepime dosing for hospital acquired pneumonia. Low grade fever of 99.57F, WBC 10.6>8.3 sCr increased from 0.75>0.84. No cultural data. MRSA PCR negative from 3/8.  Plan: Start cefepime 2g IV q8h Monitor renal function Monitor for signs/symptoms of clinical improvement, fever curve, WBC  Follow up length of duration  Height: '5\' 5"'$  (165.1 cm) Weight: 82.5 kg (181 lb 14.1 oz) IBW/kg (Calculated) : 57  Temp (24hrs), Avg:99.5 F (37.5 C), Min:98.2 F (36.8 C), Max:99.9 F (37.7 C)  Recent Labs  Lab 06/16/22 1055 06/16/22 1205 06/16/22 1742 06/17/22 0447 06/17/22 1642 06/18/22 0349  WBC  --  10.7* 9.8 9.3 10.6* 8.3  CREATININE 0.50  --  0.76 0.73 0.75 0.84    Estimated Creatinine Clearance: 67.1 mL/min (by C-G formula based on SCr of 0.84 mg/dL).    Allergies  Allergen Reactions   Trazodone And Nefazodone Shortness Of Breath   Azithromycin Rash    Antimicrobials this admission: cefepime 3/14 >>   Microbiology results: 3/14 MRSA PCR: negative  Thank you for allowing pharmacy to be a part of this patient's care.  Sandford Craze, PharmD. [facility] Acute Care PGY-1  06/18/2022 7:49 AM

## 2022-06-19 ENCOUNTER — Inpatient Hospital Stay (HOSPITAL_COMMUNITY): Payer: No Typology Code available for payment source

## 2022-06-19 LAB — BASIC METABOLIC PANEL
Anion gap: 6 (ref 5–15)
BUN: 13 mg/dL (ref 8–23)
CO2: 25 mmol/L (ref 22–32)
Calcium: 8.3 mg/dL — ABNORMAL LOW (ref 8.9–10.3)
Chloride: 96 mmol/L — ABNORMAL LOW (ref 98–111)
Creatinine, Ser: 0.88 mg/dL (ref 0.44–1.00)
GFR, Estimated: >60 mL/min (ref >60)
Glucose, Bld: 138 mg/dL — ABNORMAL HIGH (ref 70–99)
Potassium: 4.5 mmol/L (ref 3.5–5.1)
Sodium: 127 mmol/L — ABNORMAL LOW (ref 135–145)

## 2022-06-19 MED ORDER — POTASSIUM CHLORIDE CRYS ER 20 MEQ PO TBCR
20.0000 meq | EXTENDED_RELEASE_TABLET | Freq: Two times a day (BID) | ORAL | Status: AC
  Administered 2022-06-19 (×2): 20 meq via ORAL
  Filled 2022-06-19 (×2): qty 1

## 2022-06-19 MED ORDER — FUROSEMIDE 10 MG/ML IJ SOLN
40.0000 mg | Freq: Two times a day (BID) | INTRAMUSCULAR | Status: AC
  Administered 2022-06-19 (×2): 40 mg via INTRAVENOUS
  Filled 2022-06-19 (×2): qty 4

## 2022-06-19 MED ORDER — ALBUTEROL SULFATE (2.5 MG/3ML) 0.083% IN NEBU: 2.5000 mg | INHALATION_SOLUTION | RESPIRATORY_TRACT | Status: DC | PRN

## 2022-06-19 MED FILL — Lidocaine HCl Local Preservative Free (PF) Inj 2%: INTRAMUSCULAR | Qty: 14 | Status: AC

## 2022-06-19 MED FILL — Heparin Sodium (Porcine) Inj 1000 Unit/ML: Qty: 1000 | Status: AC

## 2022-06-19 MED FILL — Potassium Chloride Inj 2 mEq/ML: INTRAVENOUS | Qty: 40 | Status: AC

## 2022-06-19 NOTE — Progress Notes (Addendum)
3 Days Post-Op Procedure(s) (LRB): AORTIC VALVE REPLACEMENT (AVR) USING EDWARDS INSPIRIS SIZE 21MM (N/A) TRANSESOPHAGEAL ECHOCARDIOGRAM (N/A) Subjective:  No complaints. Still coughing up some sputum. Feels like her breathing is clearer.  Went into atrial fib with RVR overnight and converted to sinus with IV amio.  Objective: Vital signs in last 24 hours: Temp:  [98.1 F (36.7 C)-99.4 F (37.4 C)] 99.4 F (37.4 C) (03/15 0300) Pulse Rate:  [78-134] 83 (03/15 0600) Cardiac Rhythm: Normal sinus rhythm (03/14 2000) Resp:  [11-31] 21 (03/15 0600) BP: (82-139)/(46-125) 103/56 (03/15 0600) SpO2:  [81 %-97 %] 91 % (03/15 0600) Weight:  [82.3 kg] 82.3 kg (03/15 0500)  Hemodynamic parameters for last 24 hours:    Intake/Output from previous day: 03/14 0701 - 03/15 0700 In: 1002.8 [I.V.:602.8; IV Piggyback:400] Out: [id number] [Urine:3295] Intake/Output this shift: No intake/output data recorded.  General appearance: alert and cooperative Neurologic: intact Heart: regular rate and rhythm, S1, S2 normal, no murmur Lungs: clear to auscultation bilaterally Extremities: edema mild Wound: incision ok  Lab Results: Recent Labs    06/17/22 1642 06/18/22 0349  WBC 10.6* 8.3  HGB 10.3* 9.4*  HCT 29.9* 28.4*  PLT 131* 110*   BMET:  Recent Labs    06/18/22 0349 06/19/22 0401  NA 128* 127*  K 4.3 4.5  CL 97* 96*  CO2 23 25  GLUCOSE 107* 138*  BUN 11 13  CREATININE 0.84 0.88  CALCIUM 8.3* 8.3*    PT/INR:  Recent Labs    06/16/22 1205  LABPROT 17.8*  INR 1.5*   ABG    Component Value Date/Time   PHART 7.317 (L) 06/16/2022 1842   HCO3 24.6 06/16/2022 1842   TCO2 26 06/16/2022 1842   ACIDBASEDEF 2.0 06/16/2022 1842   O2SAT 92 06/16/2022 1842   CBG (last 3)  Recent Labs    06/17/22 2048 06/17/22 2333 06/18/22 0347  GLUCAP 124* 130* 115*   CXR: opacity RLL, RML, atelectasis vs infiltrate. Small right pleural effusion.  Assessment/Plan: S/P Procedure(s)  (LRB): AORTIC VALVE REPLACEMENT (AVR) USING EDWARDS INSPIRIS SIZE 21MM (N/A) TRANSESOPHAGEAL ECHOCARDIOGRAM (N/A)  POD 3 Hemodynamically stable with low normal BP. Hold off on beta blocker.  Postop atrial fib with RVR converted with IV amio. Will continue IV today and convert tomorrow if maintaining sinus.  Volume excess: -2300 cc yesterday with diuresis but wt did not decrease much. Still about 11 lbs over preop. Continue diuresis.  On empiric Maxipime for possible pneumonia with RLL, RML densities and heavy smoking with COPD.  Continue IS, flutter valve, ambulation.  Hyponatremia: May be due to excess water and diuresis. Follow. 137 preop.  LOS: 3 days    Carolyn [name] 06/19/2022

## 2022-06-19 NOTE — TOC Initial Note (Signed)
Transition of Care [facility]) - Initial/Assessment Note    Patient Details  Name: Carolyn [name] MRN: [id number] Date of Birth: January 15, 1951  [facility]) CM/SW Contact:    Erenest Rasher, RN Phone Number: [phone] 06/19/2022, 5:49 PM  Clinical Narrative:                  CM spoke to pt and states her son or sister will assist her at home when she dc. She has RW and shower chair at home. Will continue to follow for dc needs.     Expected Discharge Plan: Skilled Nursing Facility Barriers to Discharge: Continued Medical Work up   Patient Goals and CMS Choice Patient states their goals for this hospitalization and ongoing recovery are:: wants to recover CMS Medicare.gov Compare Post Acute Care list provided to:: Patient Choice offered to / list presented to : Patient      Expected Discharge Plan and Services   Discharge Planning Services: CM Consult Post Acute Care Choice: [place]: [place]        Prior Living Arrangements/Services   Lives with:: Self Patient language and need for interpreter reviewed:: Yes Do you feel safe going back to the place where you live?: Yes      Need for Family Participation in Patient Care: Yes (Comment) Care giver support system in place?: Yes (comment) Current home services: DME (rolling walker, shower chair) Criminal Activity/Legal Involvement Pertinent to Current Situation/Hospitalization: No - Comment as needed  Activities of Daily Living   ADL Screening (condition at time of admission) Patient's cognitive ability adequate to safely complete daily activities?: Yes Is the patient deaf or have difficulty hearing?: No Does the patient have difficulty seeing, even when wearing glasses/contacts?: No Does the patient have difficulty concentrating, remembering, or making decisions?: No Patient able to express need for assistance with ADLs?: Yes Does the  patient have difficulty dressing or bathing?: No Independently performs ADLs?: Yes (appropriate for developmental age) Does the patient have difficulty walking or climbing stairs?: No Weakness of Legs: None Weakness of Arms/Hands: None  Permission Sought/Granted Permission sought to share information with : Family Supports Permission granted to share information with : Yes, Verbal Permission Granted  Share Information with NAME: Carolyn [name]     Permission granted to share info w Relationship: son  Permission granted to share info w Contact Information: [phone]  Emotional Assessment Appearance:: Appears stated age Attitude/Demeanor/Rapport: Engaged Affect (typically observed): Accepting Orientation: : Oriented to Self, Oriented to Place, Oriented to  Time, Oriented to Situation   Psych Involvement: No (comment)  Admission diagnosis:  S/P aortic valve replacement [Z95.2] Patient Active Problem List   Diagnosis Date Noted   S/P aortic valve replacement 06/16/2022   Severe aortic stenosis 06/01/2022   Unstable angina ([place]) 06/01/2022   Plantar fasciitis, right 05/21/2022   Nocturnal oxygen desaturation [id number]   Systolic murmur [id number]   Cervical radiculitis 11/16/2021   Hearing loss due to cerumen impaction, right 06/23/2021   Insomnia 04/08/2021   Tobacco abuse 02/10/2017   Peripheral neuropathic pain 08/04/2016   Pulmonary nodule 07/15/2016   Constipation 04/13/2016   Aortic atherosclerosis ([place]) 01/13/2016   COPD (chronic obstructive pulmonary disease) ([place]) 01/13/2016   Depression 07/11/2015   GAD (generalized anxiety disorder) 07/11/2015   Allergic rhinitis  07/11/2015   Hypothyroidism 07/11/2015   Restless leg syndrome 07/11/2015   Atypical glandular cells of undetermined significance (AGUS) on cervical Pap smear 11/06/2013   Postmenopausal state 10/09/2013   History of cervical dysplasia 10/09/2013   PCP:  [name], DO Pharmacy:    [id number] [place], [place] [address] [place] [address] Phone: [phone] Fax: [phone]     Social Determinants of Health (SDOH) Social History: SDOH Screenings   Food Insecurity: No Food Insecurity (06/04/2022)  Housing: Low Risk  (06/01/2022)  Transportation Needs: No Transportation Needs (06/04/2022)  Utilities: Not At Risk (06/01/2022)  Depression (PHQ2-9): Low Risk  (05/18/2022)  Financial Resource Strain: Low Risk  (05/12/2018)  Physical Activity: Inactive (05/12/2018)  Social Connections: Somewhat Isolated (05/12/2018)  Stress: No Stress Concern Present (05/12/2018)  Tobacco Use: High Risk (06/18/2022)   SDOH Interventions:     Readmission Risk Interventions     No data to display

## 2022-06-19 NOTE — Progress Notes (Signed)
     [address]       [place] [address]             [phone]       EVENING ROUNDS  POD #3 SP AVR Comfortable Hemodynamics ok Following hyponatremia

## 2022-06-20 ENCOUNTER — Inpatient Hospital Stay (HOSPITAL_COMMUNITY): Payer: No Typology Code available for payment source

## 2022-06-20 LAB — BASIC METABOLIC PANEL
Anion gap: 11 (ref 5–15)
BUN: 15 mg/dL (ref 8–23)
CO2: 25 mmol/L (ref 22–32)
Calcium: 8.2 mg/dL — ABNORMAL LOW (ref 8.9–10.3)
Chloride: 94 mmol/L — ABNORMAL LOW (ref 98–111)
Creatinine, Ser: 0.82 mg/dL (ref 0.44–1.00)
GFR, Estimated: >60 mL/min (ref >60)
Glucose, Bld: 111 mg/dL — ABNORMAL HIGH (ref 70–99)
Potassium: 4.2 mmol/L (ref 3.5–5.1)
Sodium: 130 mmol/L — ABNORMAL LOW (ref 135–145)

## 2022-06-20 LAB — CBC
HCT: 25.4 % — ABNORMAL LOW (ref 36.0–46.0)
Hemoglobin: 8.8 g/dL — ABNORMAL LOW (ref 12.0–15.0)
MCH: 30.7 pg (ref 26.0–34.0)
MCHC: 34.6 g/dL (ref 30.0–36.0)
MCV: 88.5 fL (ref 80.0–100.0)
Platelets: 146 10*3/uL — ABNORMAL LOW (ref 150–400)
RBC: 2.87 MIL/uL — ABNORMAL LOW (ref 3.87–5.11)
RDW: 13.7 % (ref 11.5–15.5)
WBC: 6.6 10*3/uL (ref 4.0–10.5)
nRBC: 0 % (ref 0.0–0.2)

## 2022-06-20 MED ORDER — FUROSEMIDE 10 MG/ML IJ SOLN
40.0000 mg | Freq: Two times a day (BID) | INTRAMUSCULAR | Status: AC
  Administered 2022-06-20 (×2): 40 mg via INTRAVENOUS
  Filled 2022-06-20 (×2): qty 4

## 2022-06-20 MED ORDER — MIDODRINE HCL 5 MG PO TABS
5.0000 mg | ORAL_TABLET | Freq: Three times a day (TID) | ORAL | Status: DC
  Administered 2022-06-20 (×3): 5 mg via ORAL
  Filled 2022-06-20 (×3): qty 1

## 2022-06-20 MED ORDER — MAGNESIUM HYDROXIDE 400 MG/5ML PO SUSP
30.0000 mL | Freq: Every day | ORAL | Status: DC | PRN
  Administered 2022-06-20 – 2022-06-21 (×2): 30 mL via ORAL
  Filled 2022-06-20 (×3): qty 30

## 2022-06-20 MED ORDER — AMIODARONE HCL 200 MG PO TABS
200.0000 mg | ORAL_TABLET | Freq: Two times a day (BID) | ORAL | Status: DC
  Administered 2022-06-20 – 2022-06-24 (×9): 200 mg via ORAL
  Filled 2022-06-20 (×9): qty 1

## 2022-06-20 NOTE — Progress Notes (Signed)
[address]       [place] [address]             [phone]      4 Days Post-Op  Procedure(s) (LRB): AORTIC VALVE REPLACEMENT (AVR) USING EDWARDS INSPIRIS SIZE 21MM (N/A) TRANSESOPHAGEAL ECHOCARDIOGRAM (N/A)  Total Length of Stay:  LOS: 4 days   SUBJECTIVE: Pt without complaint Ambulating Has audible wet cough Stayed in NSR  Vitals:   06/20/22 0600 06/20/22 0700  BP:  (!) 103/53  Pulse:  82  Resp: (!) 25 17  Temp:    SpO2:  (!) 80%    Intake/Output      03/15 0701 03/16 0700 03/16 0701 03/17 0700   P.O.  120   I.V. (mL/kg) 1063.9 (13.3)    IV Piggyback 300.2    Total Intake(mL/kg) 1364.1 (17) 120 (1.5)   Urine (mL/kg/hr) 3540 (1.8)    Total Output 3540    Net -2175.9 +120            sodium chloride     sodium chloride     sodium chloride 10 mL/hr at 06/20/22 0700   sodium chloride Stopped (06/17/22 2237)   amiodarone 30 mg/hr (06/20/22 0700)   ceFEPime (MAXIPIME) IV Stopped (06/20/22 0041)   lactated ringers Stopped (06/18/22 1233)   phenylephrine (NEO-SYNEPHRINE) Adult infusion 20 mcg/min (06/20/22 0700)    CBC    Component Value Date/Time   WBC 6.6 06/20/2022 0440   RBC 2.87 (L) 06/20/2022 0440   HGB 8.8 (L) 06/20/2022 0440   HGB 14.7 10/14/2018 1114   HCT 25.4 (L) 06/20/2022 0440   HCT 43.0 10/14/2018 1114   PLT 146 (L) 06/20/2022 0440   PLT 212 10/14/2018 1114   MCV 88.5 06/20/2022 0440   MCV 89 10/14/2018 1114   MCH 30.7 06/20/2022 0440   MCHC 34.6 06/20/2022 0440   RDW 13.7 06/20/2022 0440   RDW 12.8 10/14/2018 1114   LYMPHSABS 1,348 05/18/2022 1151   LYMPHSABS 1.4 10/14/2018 1114   MONOABS 0.6 10/28/2006 1354   EOSABS 99 05/18/2022 1151   EOSABS 0.0 10/14/2018 1114   BASOSABS 72 05/18/2022 1151   BASOSABS 0.1 10/14/2018 1114   CMP     Component Value Date/Time   NA 130 (L) 06/20/2022 0440   NA 131 (L) 10/14/2018 1114   K 4.2 06/20/2022 0440   CL 94 (L) 06/20/2022 0440   CO2 25 06/20/2022 0440    GLUCOSE 111 (H) 06/20/2022 0440   BUN 15 06/20/2022 0440   BUN 11 10/14/2018 1114   CREATININE 0.82 06/20/2022 0440   CREATININE 0.70 05/18/2022 1151   CALCIUM 8.2 (L) 06/20/2022 0440   PROT 6.2 (L) 06/12/2022 1430   PROT 6.8 10/14/2018 1114   ALBUMIN 3.5 06/12/2022 1430   ALBUMIN 4.6 10/14/2018 1114   AST 25 06/12/2022 1430   ALT 25 06/12/2022 1430   ALKPHOS 67 06/12/2022 1430   BILITOT 0.4 06/12/2022 1430   BILITOT 0.5 10/14/2018 1114   GFRNONAA >60 06/20/2022 0440   GFRAA 88 10/14/2018 1114   ABG    Component Value Date/Time   PHART 7.317 (L) 06/16/2022 1842   PCO2ART 48.3 (H) 06/16/2022 1842   PO2ART 74 (L) 06/16/2022 1842   HCO3 24.6 06/16/2022 1842   TCO2 26 06/16/2022 1842   ACIDBASEDEF 2.0 06/16/2022 1842   O2SAT 92 06/16/2022 1842   CBG (last 3)  Recent Labs    06/17/22 2048 06/17/22 2333 06/18/22 0347  GLUCAP  124* 130* 115*  EXAM Lungs: rhonchi and wheezing througout Card: RR Incision: clean, pw still in place Ext: warm. Trace edema Neuro: intact   ASSESSMENT: POD #4 SP AVR Hemodynamics requiring neo for bp support. Will add midodrine Rhythm: has maintained NSR. Will change to po amiodarone Renal: continues with good diuresis. Continue. Na improving Pulm: CXR with ongoing right effusion/consolidation. On antibx. Cont. Diuresis Leave in unit today   Coralie Common, MD 06/20/2022

## 2022-06-20 NOTE — Progress Notes (Signed)
     [address]       [place] [address]             [phone]       EVENING ROUNDS  Feels well and ambulated several times Not urinated after foley removed Will scan later if needed

## 2022-06-21 LAB — CBC
HCT: 25.9 % — ABNORMAL LOW (ref 36.0–46.0)
Hemoglobin: 8.6 g/dL — ABNORMAL LOW (ref 12.0–15.0)
MCH: 29.4 pg (ref 26.0–34.0)
MCHC: 33.2 g/dL (ref 30.0–36.0)
MCV: 88.4 fL (ref 80.0–100.0)
Platelets: 187 10*3/uL (ref 150–400)
RBC: 2.93 MIL/uL — ABNORMAL LOW (ref 3.87–5.11)
RDW: 13.8 % (ref 11.5–15.5)
WBC: 7.4 10*3/uL (ref 4.0–10.5)
nRBC: 0.4 % — ABNORMAL HIGH (ref 0.0–0.2)

## 2022-06-21 LAB — BASIC METABOLIC PANEL
Anion gap: 7 (ref 5–15)
BUN: 19 mg/dL (ref 8–23)
CO2: 28 mmol/L (ref 22–32)
Calcium: 8.1 mg/dL — ABNORMAL LOW (ref 8.9–10.3)
Chloride: 97 mmol/L — ABNORMAL LOW (ref 98–111)
Creatinine, Ser: 0.84 mg/dL (ref 0.44–1.00)
GFR, Estimated: >60 mL/min (ref >60)
Glucose, Bld: 113 mg/dL — ABNORMAL HIGH (ref 70–99)
Potassium: 3.5 mmol/L (ref 3.5–5.1)
Sodium: 132 mmol/L — ABNORMAL LOW (ref 135–145)

## 2022-06-21 MED ORDER — MIDODRINE HCL 5 MG PO TABS
10.0000 mg | ORAL_TABLET | Freq: Three times a day (TID) | ORAL | Status: DC
  Administered 2022-06-21 – 2022-06-24 (×10): 10 mg via ORAL
  Filled 2022-06-21 (×10): qty 2

## 2022-06-21 MED ORDER — ALPRAZOLAM 0.25 MG PO TABS
0.2500 mg | ORAL_TABLET | Freq: Three times a day (TID) | ORAL | Status: DC | PRN
  Administered 2022-06-21: 0.25 mg via ORAL
  Filled 2022-06-21: qty 1

## 2022-06-21 MED ORDER — FUROSEMIDE 20 MG PO TABS
20.0000 mg | ORAL_TABLET | Freq: Every day | ORAL | Status: DC
  Administered 2022-06-21 – 2022-06-24 (×4): 20 mg via ORAL
  Filled 2022-06-21 (×4): qty 1

## 2022-06-21 MED ORDER — POLYETHYLENE GLYCOL 3350 17 G PO PACK
34.0000 g | PACK | ORAL | Status: AC
  Administered 2022-06-21 (×2): 34 g via ORAL
  Filled 2022-06-21 (×2): qty 2

## 2022-06-21 MED ORDER — SORBITOL 70 % SOLN
30.0000 mL | Freq: Every day | Status: DC | PRN
  Administered 2022-06-21 – 2022-06-22 (×2): 30 mL via ORAL
  Filled 2022-06-21 (×2): qty 30

## 2022-06-21 MED ORDER — POTASSIUM CHLORIDE CRYS ER 20 MEQ PO TBCR
20.0000 meq | EXTENDED_RELEASE_TABLET | ORAL | Status: AC
  Administered 2022-06-21 (×3): 20 meq via ORAL
  Filled 2022-06-21 (×3): qty 1

## 2022-06-21 NOTE — Progress Notes (Signed)
Pt keeps removing [place] and says she doesn't want it and does not care how low her oxygen drops. RT was able to get her to place [place] back on at this time. Will continue to monitor pt and make sure she keeps [place] on.

## 2022-06-21 NOTE — Progress Notes (Signed)
[address]       [place] [address]             [phone]      5 Days Post-Op  Procedure(s) (LRB): AORTIC VALVE REPLACEMENT (AVR) USING EDWARDS INSPIRIS SIZE 21MM (N/A) TRANSESOPHAGEAL ECHOCARDIOGRAM (N/A)   Total Length of Stay:  LOS: 5 days   SUBJECTIVE: Pt upset and emotional that she has been here and no progress, can't sleep and hasn't had BM Vitals:   06/21/22 0515 06/21/22 0530  BP:    Pulse: 76 80  Resp: (!) 26 17  Temp:    SpO2: (!) 76% (!) 74%    Intake/Output      03/16 0701 03/17 0700 03/17 0701 03/18 0700   P.O. 600    I.V. (mL/kg) 473.5 (5.4)    IV Piggyback 300    Total Intake(mL/kg) 1373.5 (15.6)    Urine (mL/kg/hr) 2425 (1.2)    Total Output 2425    Net -1051.5         Urine Occurrence 7 x        sodium chloride     sodium chloride     sodium chloride 10 mL/hr at 06/21/22 0400   sodium chloride Stopped (06/17/22 2237)   ceFEPime (MAXIPIME) IV 2 g (06/21/22 0746)   lactated ringers Stopped (06/18/22 1233)   phenylephrine (NEO-SYNEPHRINE) Adult infusion 15 mcg/min (06/21/22 0549)    CBC    Component Value Date/Time   WBC 7.4 06/21/2022 0342   RBC 2.93 (L) 06/21/2022 0342   HGB 8.6 (L) 06/21/2022 0342   HGB 14.7 10/14/2018 1114   HCT 25.9 (L) 06/21/2022 0342   HCT 43.0 10/14/2018 1114   PLT 187 06/21/2022 0342   PLT 212 10/14/2018 1114   MCV 88.4 06/21/2022 0342   MCV 89 10/14/2018 1114   MCH 29.4 06/21/2022 0342   MCHC 33.2 06/21/2022 0342   RDW 13.8 06/21/2022 0342   RDW 12.8 10/14/2018 1114   LYMPHSABS 1,348 05/18/2022 1151   LYMPHSABS 1.4 10/14/2018 1114   MONOABS 0.6 10/28/2006 1354   EOSABS 99 05/18/2022 1151   EOSABS 0.0 10/14/2018 1114   BASOSABS 72 05/18/2022 1151   BASOSABS 0.1 10/14/2018 1114   CMP     Component Value Date/Time   NA 132 (L) 06/21/2022 0342   NA 131 (L) 10/14/2018 1114   K 3.5 06/21/2022 0342   CL 97 (L) 06/21/2022 0342   CO2 28 06/21/2022 0342   GLUCOSE 113 (H)  06/21/2022 0342   BUN 19 06/21/2022 0342   BUN 11 10/14/2018 1114   CREATININE 0.84 06/21/2022 0342   CREATININE 0.70 05/18/2022 1151   CALCIUM 8.1 (L) 06/21/2022 0342   PROT 6.2 (L) 06/12/2022 1430   PROT 6.8 10/14/2018 1114   ALBUMIN 3.5 06/12/2022 1430   ALBUMIN 4.6 10/14/2018 1114   AST 25 06/12/2022 1430   ALT 25 06/12/2022 1430   ALKPHOS 67 06/12/2022 1430   BILITOT 0.4 06/12/2022 1430   BILITOT 0.5 10/14/2018 1114   GFRNONAA >60 06/21/2022 0342   GFRAA 88 10/14/2018 1114   ABG    Component Value Date/Time   PHART 7.317 (L) 06/16/2022 1842   PCO2ART 48.3 (H) 06/16/2022 1842   PO2ART 74 (L) 06/16/2022 1842   HCO3 24.6 06/16/2022 1842   TCO2 26 06/16/2022 1842   ACIDBASEDEF 2.0 06/16/2022 1842   O2SAT 92 06/16/2022 1842   CBG (last 3)  No results for input(s): "GLUCAP" in the  last 72 hours. EXAM Lungs: rhonchorous and decreased on Right Card: rr  Incision: clean and dry Ext: warm  ASSESSMENT: POD #5 SP AVR Hemodynamics ok but still on neo. Will increase midodrine Pulm: improving. Will hold diuretic in evening so she doesn't have to wake up Has right effusion on cxr Renal: stable Discussed issues with pt and son   Coralie Common, MD 06/21/2022

## 2022-06-21 NOTE — Progress Notes (Signed)
     [address]       [place] [address]             [phone]       EVENING ROUNDS  Still no BM. Pt refusing enema. Will try miralax double the dose Back on neo. Will continue midodrine and try to wean again

## 2022-06-21 NOTE — Progress Notes (Signed)
Pharmacy Antibiotic Note  Carolyn [name] is a 72 y.o. female admitted on 06/16/2022 for prosthetic aortic valve procedure s/p 2 days. Pharmacy has been consulted for cefepime dosing for hospital acquired pneumonia. Cr stable, WBC normalized, afebrile.  Plan: Continue cefepime 2g IV q8h  Height: 5\' 5"  (165.1 cm) Weight: 87.8 kg (193 lb 9 oz) IBW/kg (Calculated) : 57  Temp (24hrs), Avg:98.9 F (37.2 C), Min:98.6 F (37 C), Max:99.1 F (37.3 C)  Recent Labs  Lab 06/17/22 0447 06/17/22 1642 06/18/22 0349 06/19/22 0401 06/20/22 0440 06/21/22 0342  WBC 9.3 10.6* 8.3  --  6.6 7.4  CREATININE 0.73 0.75 0.84 0.88 0.82 0.84     Estimated Creatinine Clearance: 69.2 mL/min (by C-G formula based on SCr of 0.84 mg/dL).    Allergies  Allergen Reactions   Trazodone And Nefazodone Shortness Of Breath   Azithromycin Rash    Antimicrobials this admission: cefepime 3/14 >>   Microbiology results: 3/14 MRSA PCR: negative  Thank you for allowing pharmacy to be a part of this patient's care.  Arrie Senate, PharmD, BCPS, [facility] Clinical Pharmacist [phone] Please check AMION for all [place] numbers 06/21/2022

## 2022-06-22 ENCOUNTER — Inpatient Hospital Stay (HOSPITAL_COMMUNITY): Payer: No Typology Code available for payment source

## 2022-06-22 LAB — BASIC METABOLIC PANEL
Anion gap: 9 (ref 5–15)
BUN: 15 mg/dL (ref 8–23)
CO2: 29 mmol/L (ref 22–32)
Calcium: 8.3 mg/dL — ABNORMAL LOW (ref 8.9–10.3)
Chloride: 90 mmol/L — ABNORMAL LOW (ref 98–111)
Creatinine, Ser: 0.78 mg/dL (ref 0.44–1.00)
GFR, Estimated: >60 mL/min (ref >60)
Glucose, Bld: 117 mg/dL — ABNORMAL HIGH (ref 70–99)
Potassium: 4.7 mmol/L (ref 3.5–5.1)
Sodium: 128 mmol/L — ABNORMAL LOW (ref 135–145)

## 2022-06-22 LAB — CBC
HCT: 26.3 % — ABNORMAL LOW (ref 36.0–46.0)
Hemoglobin: 8.8 g/dL — ABNORMAL LOW (ref 12.0–15.0)
MCH: 29.8 pg (ref 26.0–34.0)
MCHC: 33.5 g/dL (ref 30.0–36.0)
MCV: 89.2 fL (ref 80.0–100.0)
Platelets: 190 10*3/uL (ref 150–400)
RBC: 2.95 MIL/uL — ABNORMAL LOW (ref 3.87–5.11)
RDW: 14 % (ref 11.5–15.5)
WBC: 8.1 10*3/uL (ref 4.0–10.5)
nRBC: 0.6 % — ABNORMAL HIGH (ref 0.0–0.2)

## 2022-06-22 MED ORDER — SODIUM CHLORIDE 0.9% FLUSH
3.0000 mL | Freq: Two times a day (BID) | INTRAVENOUS | Status: DC
  Administered 2022-06-23: 3 mL via INTRAVENOUS

## 2022-06-22 MED ORDER — TRAMADOL HCL 50 MG PO TABS
50.0000 mg | ORAL_TABLET | ORAL | Status: DC | PRN
  Administered 2022-06-23 – 2022-06-24 (×4): 50 mg via ORAL
  Filled 2022-06-22 (×4): qty 1

## 2022-06-22 MED ORDER — SODIUM CHLORIDE 0.9% FLUSH: 3.0000 mL | INTRAVENOUS | Status: DC | PRN

## 2022-06-22 MED ORDER — CLONAZEPAM 0.5 MG PO TABS
0.5000 mg | ORAL_TABLET | Freq: Every day | ORAL | Status: DC
  Administered 2022-06-22 – 2022-06-23 (×2): 0.5 mg via ORAL
  Filled 2022-06-22 (×2): qty 1

## 2022-06-22 MED ORDER — LACTULOSE 10 GM/15ML PO SOLN
20.0000 g | Freq: Once | ORAL | Status: AC
  Administered 2022-06-22: 20 g via ORAL
  Filled 2022-06-22: qty 30

## 2022-06-22 MED ORDER — ASPIRIN 81 MG PO TBEC
81.0000 mg | DELAYED_RELEASE_TABLET | Freq: Every day | ORAL | Status: DC
  Administered 2022-06-23 – 2022-06-24 (×2): 81 mg via ORAL
  Filled 2022-06-22 (×3): qty 1

## 2022-06-22 MED ORDER — SODIUM CHLORIDE 0.9 % IV SOLN: 250.0000 mL | INTRAVENOUS | Status: DC | PRN

## 2022-06-22 MED ORDER — ~~LOC~~ CARDIAC SURGERY, PATIENT & FAMILY EDUCATION: Freq: Once | Status: DC

## 2022-06-22 NOTE — Progress Notes (Signed)
6 Days Post-Op Procedure(s) (LRB): AORTIC VALVE REPLACEMENT (AVR) USING EDWARDS INSPIRIS SIZE 21MM (N/A) TRANSESOPHAGEAL ECHOCARDIOGRAM (N/A) Subjective: Sore but ambulated.  Remained on neo over the weekend as well as midodrine. Neo just turned off this am.  Objective: Vital signs in last 24 hours: Temp:  [98.9 F (37.2 C)] 98.9 F (37.2 C) (03/17 1928) Pulse Rate:  [62-81] 62 (03/18 0200) Cardiac Rhythm: Normal sinus rhythm (03/17 2000) Resp:  [10-33] 11 (03/18 0200) BP: (79-123)/(42-85) 95/59 (03/18 0200) SpO2:  [71 %-99 %] 97 % (03/18 0200) Weight:  [80.9 kg] 80.9 kg (03/18 0500)  Hemodynamic parameters for last 24 hours:    Intake/Output from previous day: 03/17 0701 - 03/18 0700 In: 682.7 [P.O.:240; I.V.:142.8; IV Piggyback:299.9] Out: 300 [Urine:300] Intake/Output this shift: No intake/output data recorded.  General appearance: alert and cooperative Neurologic: intact Heart: regular rate and rhythm, S1, S2 normal, no murmur Lungs: diminished breath sounds bibasilar Extremities: edema mild in feet Wound: incision healing well.  Lab Results: Recent Labs    06/21/22 0342 06/22/22 0445  WBC 7.4 8.1  HGB 8.6* 8.8*  HCT 25.9* 26.3*  PLT 187 190   BMET:  Recent Labs    06/21/22 0342 06/22/22 0445  NA 132* 128*  K 3.5 4.7  CL 97* 90*  CO2 28 29  GLUCOSE 113* 117*  BUN 19 15  CREATININE 0.84 0.78  CALCIUM 8.1* 8.3*    PT/INR: No results for input(s): "LABPROT", "INR" in the last 72 hours. ABG    Component Value Date/Time   PHART 7.317 (L) 06/16/2022 1842   HCO3 24.6 06/16/2022 1842   TCO2 26 06/16/2022 1842   ACIDBASEDEF 2.0 06/16/2022 1842   O2SAT 92 06/16/2022 1842   CBG (last 3)  No results for input(s): "GLUCAP" in the last 72 hours.  Assessment/Plan: S/P Procedure(s) (LRB): AORTIC VALVE REPLACEMENT (AVR) USING EDWARDS INSPIRIS SIZE 21MM (N/A) TRANSESOPHAGEAL ECHOCARDIOGRAM (N/A)  POD 6  Has required some vasopressor to maintain low  normal BP. Neo off this am and will continue midodrine for now.   Postop atrial fib converted to sinus on amio. Has remained in sinus. DC pacing wires.  Volume excess: wt is 8 lbs over preop. Continue gentle diuresis.  Smoking and COPD: Continues of Maxipime for possible pneumonia. Will treat for 1 wk course. Continue IS, flutter valve. Repeat CXR this am. She had right pleural effusion on prior CXR and may need thoracentesis.  Continue mobilization.   Can transfer to 4E if BP stable off neo this morning.   LOS: 6 days    [name] 06/22/2022

## 2022-06-23 LAB — BASIC METABOLIC PANEL
Anion gap: 8 (ref 5–15)
BUN: 13 mg/dL (ref 8–23)
CO2: 28 mmol/L (ref 22–32)
Calcium: 8.4 mg/dL — ABNORMAL LOW (ref 8.9–10.3)
Chloride: 94 mmol/L — ABNORMAL LOW (ref 98–111)
Creatinine, Ser: 0.86 mg/dL (ref 0.44–1.00)
GFR, Estimated: >60 mL/min (ref >60)
Glucose, Bld: 109 mg/dL — ABNORMAL HIGH (ref 70–99)
Potassium: 4 mmol/L (ref 3.5–5.1)
Sodium: 130 mmol/L — ABNORMAL LOW (ref 135–145)

## 2022-06-23 MED FILL — Thrombin (Recombinant) For Soln 20000 Unit: CUTANEOUS | Qty: 1 | Status: AC

## 2022-06-23 NOTE — Progress Notes (Signed)
CARDIAC REHAB PHASE I   PRE:  Rate/Rhythm: 75 NSR  BP:  Sitting: 101/71      SaO2: 94 RA  MODE:  Ambulation: 470 ft   AD:  None  POST:  Rate/Rhythm: 102 ST  BP:  Sitting: 123/59      SaO2: 96 RA  Pt amb with contact guard assistance, pt denies CP and SOB during amb and was returned to room w/o complaint.  Pt unsteady w/o assistance when walking long distances, recommended LL exercises.  Pt received OHS book and education on restrictions, heart healthy diet, ex guidelines, Move in the Tube sheet, incentive spirometer use when d/c, smoking cessation, and CRPII. Pt denies questions and was encouraged to look in the book for additional information. Will refer to [facility].  Pt is not interested in smoking cessation.   Christen Bame  9:30 AM 06/23/2022    Service time is from 0850 to 0930.

## 2022-06-23 NOTE — Progress Notes (Addendum)
      [address]       [place] [address]             [phone]      7 Days Post-Op Procedure(s) (LRB): AORTIC VALVE REPLACEMENT (AVR) USING EDWARDS INSPIRIS SIZE 21MM (N/A) TRANSESOPHAGEAL ECHOCARDIOGRAM (N/A) Subjective: Transferred from ICU yesterday.  Feeling stronger overall, able to get out of bet unassisted. Wants to go home.  Currently on RA. BM yesterday.    Objective: Vital signs in last 24 hours: Temp:  [97.7 F (36.5 C)-99.4 F (37.4 C)] 97.7 F (36.5 C) (03/19 0409) Pulse Rate:  [71-93] 82 (03/19 0409) Cardiac Rhythm: Normal sinus rhythm (03/19 0010) Resp:  [13-24] 20 (03/19 0409) BP: (92-116)/(35-86) 112/86 (03/19 0409) SpO2:  [90 %-100 %] 93 % (03/19 0409) Weight:  [81.3 kg] 81.3 kg (03/19 0500)    Intake/Output from previous day: 03/18 0701 - 03/19 0700 In: 1056.6 [P.O.:840; I.V.:6.9; IV Piggyback:209.7] Out: 950 [Urine:950] Intake/Output this shift: No intake/output data recorded.  General appearance: alert, cooperative, no distress, and anxious Neurologic: intact Heart: RRR Lungs: coarse breath sounds, cough less productive. Abdomen: soft, NT Extremities: no peripheral edema Wound: the sternotomy incision is healing well.  Lab Results: Recent Labs    06/21/22 0342 06/22/22 0445  WBC 7.4 8.1  HGB 8.6* 8.8*  HCT 25.9* 26.3*  PLT 187 190   BMET:  Recent Labs    06/22/22 0445 06/23/22 0101  NA 128* 130*  K 4.7 4.0  CL 90* 94*  CO2 29 28  GLUCOSE 117* 109*  BUN 15 13  CREATININE 0.78 0.86  CALCIUM 8.3* 8.4*    PT/INR: No results for input(s): "LABPROT", "INR" in the last 72 hours. ABG    Component Value Date/Time   PHART 7.317 (L) 06/16/2022 1842   HCO3 24.6 06/16/2022 1842   TCO2 26 06/16/2022 1842   ACIDBASEDEF 2.0 06/16/2022 1842   O2SAT 92 06/16/2022 1842   CBG (last 3)  No results for input(s): "GLUCAP" in the last 72 hours.  Assessment/Plan: S/P Procedure(s) (LRB): AORTIC VALVE REPLACEMENT  (AVR) USING EDWARDS INSPIRIS SIZE 21MM (N/A) TRANSESOPHAGEAL ECHOCARDIOGRAM (N/A)  -POD7 AVR with a bioprosthesis for  severe AS and moderate AI. BP ~ 123456 systolic, on Midodrine 10mg  TID. Diuresing slowly as BP allows.  -Post-op atrial fibrillation- maintaining SR on oral amiodarone. K+ 4.0  -COPD, long-term smoker- on day 6 of 7 IV cefepime. Right pleural effusion improved on CXR yesterday.  Continue working on [employer].   -Hypothyroidism- on her usual dose of levothyroxine  -History of anxiety and depression- on Xanax, Klonopin, and Lyrica.   -Progressing well with mobility. Planning for eventual discharge to home later this week.    LOS: 7 days   Malon Kindle [id number] 06/23/2022   Chart reviewed, patient examined, agree with above. She looks good and maintaining sinus rhythm. She wants to go home. I told her she can go home tomorrow am. Pandora Leiter can pick her up at 9 am. She is refusing new IV and I think we can leave that out. DC telemetry since she is refusing that too.

## 2022-06-23 NOTE — Progress Notes (Signed)
Patient continues to refuse new PIV placement and is now refusing telemetry and mentioned leaving AMA. [place] PA notified and will come speak with patient.

## 2022-06-23 NOTE — Progress Notes (Signed)
Pt refused IV insertion at this time - charge Rn aware

## 2022-06-23 NOTE — Progress Notes (Signed)
Dr Cyndia Bent at bedside to speak with patient. Per MD, okay to leave IV out and leave patient off telemetry as she will likely be discharged tomorrow. Patient verbalizes understanding.

## 2022-06-23 NOTE — Progress Notes (Signed)
To room for IV Team  access consult. Explained to patient about how I place IV with an ultrasound. Told me it didn't matter because she didn't want another IV. Talked with patient about her need for one & that's how she receives her antibiotics. Patient wants her nurse to call the Dr. and get switched to everything by mouth. Refused IV at this time. RN made aware.

## 2022-06-23 NOTE — Care Management Important Message (Signed)
Important Message  Patient Details  Name: Carolyn [name] MRN: [id number] Date of Birth: 1950/08/06   Medicare Important Message Given:  Yes     Shelda Altes 06/23/2022, 8:36 AM

## 2022-06-24 ENCOUNTER — Other Ambulatory Visit: Payer: Self-pay | Admitting: Family Medicine

## 2022-06-24 MED ORDER — TRAMADOL HCL 50 MG PO TABS: 50.0000 mg | ORAL_TABLET | Freq: Four times a day (QID) | ORAL | 0 refills | Status: AC | PRN

## 2022-06-24 MED ORDER — FUROSEMIDE 40 MG PO TABS: 40.0000 mg | ORAL_TABLET | Freq: Every day | ORAL | 0 refills | Status: DC

## 2022-06-24 MED ORDER — MIDODRINE HCL 5 MG PO TABS: 5.0000 mg | ORAL_TABLET | Freq: Three times a day (TID) | ORAL | 1 refills | Status: DC

## 2022-06-24 MED ORDER — AMIODARONE HCL 200 MG PO TABS: 200.0000 mg | ORAL_TABLET | Freq: Two times a day (BID) | ORAL | 1 refills | Status: DC

## 2022-06-24 MED ORDER — POTASSIUM CHLORIDE CRYS ER 20 MEQ PO TBCR: 20.0000 meq | EXTENDED_RELEASE_TABLET | Freq: Every day | ORAL | 0 refills | Status: DC

## 2022-06-24 NOTE — Progress Notes (Addendum)
      [address]       [place] [address]             [phone]      8 Days Post-Op Procedure(s) (LRB): AORTIC VALVE REPLACEMENT (AVR) USING EDWARDS INSPIRIS SIZE 21MM (N/A) TRANSESOPHAGEAL ECHOCARDIOGRAM (N/A) Subjective:  Sitting up eating breakfast. Anxious to go home.  Says she feels good but pointed out more swelling in her ankles. O2 sat 96% on RA.   Objective: Vital signs in last 24 hours: Temp:  [97.4 F (36.3 C)-98.7 F (37.1 C)] 98.7 F (37.1 C) (03/20 0349) Pulse Rate:  [72-80] 78 (03/20 0349) Cardiac Rhythm: Normal sinus rhythm (03/19 0809) Resp:  [17-20] 20 (03/20 0349) BP: (101-132)/(60-71) 132/68 (03/20 0349) SpO2:  [92 %-96 %] 96 % (03/20 0349) Weight:  [81.8 kg] 81.8 kg (03/20 0400)    Intake/Output from previous day: 03/19 0701 - 03/20 0700 In: 240 [P.O.:240] Out: -  Intake/Output this shift: No intake/output data recorded.  General appearance: alert, cooperative, no distress, and anxious Neurologic: intact Heart: RRR Lungs: mild exp wheezes Abdomen: soft, NT Extremities: mild edema in feet and ankles, minimal pre-tibial edema Wound: the sternotomy incision is healing well.  Lab Results: Recent Labs    06/22/22 0445  WBC 8.1  HGB 8.8*  HCT 26.3*  PLT 190    BMET:  Recent Labs    06/22/22 0445 06/23/22 0101  NA 128* 130*  K 4.7 4.0  CL 90* 94*  CO2 29 28  GLUCOSE 117* 109*  BUN 15 13  CREATININE 0.78 0.86  CALCIUM 8.3* 8.4*     PT/INR: No results for input(s): "LABPROT", "INR" in the last 72 hours. ABG    Component Value Date/Time   PHART 7.317 (L) 06/16/2022 1842   HCO3 24.6 06/16/2022 1842   TCO2 26 06/16/2022 1842   ACIDBASEDEF 2.0 06/16/2022 1842   O2SAT 92 06/16/2022 1842   CBG (last 3)  No results for input(s): "GLUCAP" in the last 72 hours.  Assessment/Plan: S/P Procedure(s) (LRB): AORTIC VALVE REPLACEMENT (AVR) USING EDWARDS INSPIRIS SIZE 21MM (N/A) TRANSESOPHAGEAL ECHOCARDIOGRAM  (N/A)  -POD8 AVR with a bioprosthesis for  severe AS and moderate AI. BP improved to [id number] systolic, on Midodrine 10mg  TID--will decrease  to 5mg  TID at discharge. Still several pounds above pre-op wt.  Continue Lasix at home for 7 days.   -Post-op atrial fibrillation- Off monitor per patient's request. Regular rhythm by exam  -COPD, long-term smoker- O2 sats acceptable on RA.  Encouraged to continue working on pulm hygiene at home with IS and flutter valve.   -Hypothyroidism- on her usual dose of levothyroxine  -History of anxiety and depression- on Xanax, Klonopin, and Lyrica.   -Independent with mobility. Discharge to home today.   LOS: 8 days   Antony Odea, Hershal Coria [id number] 06/24/2022   Chart reviewed, patient examined, agree with above. She looks good, already dressed and ready to go home. She has some edema remaining in feet and will continue lasix at home for a week.

## 2022-06-24 NOTE — TOC Transition Note (Signed)
Transition of Care (TOC) - CM/SW Discharge Note [name] RN, BSN Transitions of Care Unit 4E- RN Case Manager See Treatment Team for direct phone #   Patient Details  Name: Carolyn [name] MRN: [id number] Date of Birth: 21-Sep-1950  Transition [facility] Care [facility]) CM/SW Contact:  [name], RN Phone Number: 06/24/2022, 11:55 AM   Clinical Narrative:    Pt stable for transition home today, Notified by [name] that they are following patient with MD office protocol referral prearranged for [facility] needs. CM has sent liaison msg that pt discharging home today for start of care needs.   No further TOC needs noted. Family to transport home   Final next level of care: [name] Barriers to Discharge: Barriers Resolved   Patient Goals and CMS Choice CMS Medicare.gov Compare Post Acute Care list provided to:: Patient Choice offered to / list presented to : Patient  Discharge Placement                 Home w/ [facility]        Discharge Plan and Services Additional resources added to the After Visit Summary for     Discharge Planning Services: CM Consult Post Acute Care Choice: Home Health          DME Arranged: N/A DME Agency: NA       HH Arranged: NA HH Agency: [place] Date Deputy: 06/24/22   Representative spoke with at [place]: [place] Determinants of Health ([place]) Interventions SDOH Screenings   Food Insecurity: No Food Insecurity (06/22/2022)  Housing: Low Risk  (06/22/2022)  Transportation Needs: No Transportation Needs (06/22/2022)  Utilities: Not At Risk (06/22/2022)  Depression (PHQ2-9): Low Risk  (05/18/2022)  Financial Resource Strain: Low Risk  (05/12/2018)  Physical Activity: Inactive (05/12/2018)  Social Connections: Somewhat Isolated (05/12/2018)  Stress: No Stress Concern Present (05/12/2018)  Tobacco Use: High Risk (06/18/2022)     Readmission Risk Interventions    06/24/2022   11:55 AM  Readmission  Risk Prevention Plan  Post Dischage Appt Complete  Medication Screening Complete  Transportation Screening Complete

## 2022-06-24 NOTE — Progress Notes (Signed)
Pt denies questions, looking forwaqrd to going home today.  Christen Bame 06/24/2022 8:56 AM

## 2022-06-25 ENCOUNTER — Telehealth: Payer: Self-pay

## 2022-06-25 ENCOUNTER — Other Ambulatory Visit: Payer: Self-pay | Admitting: Podiatry

## 2022-06-25 ENCOUNTER — Encounter: Payer: Self-pay | Admitting: *Deleted

## 2022-06-25 ENCOUNTER — Telehealth: Payer: Self-pay | Admitting: *Deleted

## 2022-06-25 LAB — TYPE AND SCREEN
ABO/RH(D): O POS
Antibody Screen: NEGATIVE
Unit division: 0
Unit division: 0

## 2022-06-25 LAB — BPAM RBC
Blood Product Expiration Date: 202404092359
Blood Product Expiration Date: 202404092359
ISSUE DATE / TIME: 202403111513
ISSUE DATE / TIME: 202403111513
Unit Type and Rh: 5100
Unit Type and Rh: 5100

## 2022-06-25 NOTE — Telephone Encounter (Signed)
She should be able to have this done at anytime.  Referral entered previously for home sleep study.

## 2022-06-25 NOTE — Telephone Encounter (Signed)
Pt states she returned home from open heart surgery yesterday. She would like to know how soon can she have her sleep study completed. Pt was told her O2 sats dropped into the 80's while at the hospital.   Forwarded to Dr. Zigmund Daniel.

## 2022-06-25 NOTE — Transitions of Care (Post Inpatient/ED Visit) (Signed)
06/25/2022  Name: Carolyn [name] MRN: [id number] DOB: 09-27-1950  Today's TOC FU Call Status: Today's TOC FU Call Status:: Successful TOC FU Call Competed TOC FU Call Complete Date: 06/25/22  Transition Care Management Follow-up Telephone Call Date of Discharge: 06/24/22 Discharge Facility: [facility]) Type of Discharge: Inpatient Admission Primary Inpatient Discharge Diagnosis:: severe AS- S/P surgical AV replacement How have you been since you were released from the hospital?: Better ("I am doing fine, not needing the pain medicine except at night; taking all the medicine ike they told me to.  I called Dr. Zigmund Daniel office today to see if he will go ahead and order a sleep study because my oxygen level dropped at the hospital") Any questions or concerns?: Yes Patient Questions/Concerns:: Would like to have sleep study ordered-- verified patient called PCP to request order earier today; facilitated scheduling of hospital follow up PCP office visit on 07/01/22 and encouraged patient to discuss with PCP during office visit if she has not heard back by then-- see additional interventions below Patient Questions/Concerns Addressed: Other: (as above/ see interventions)  Items Reviewed: Did you receive and understand the discharge instructions provided?: Yes (thoroughly reviewed with patient who verbalizes excellent understanding of same) Medications obtained and verified?: Yes (Medications Reviewed) (Full medication review completed; no concerns or discrepancies identified; confirmed patient obtained/ is taking all newly Rx'd medications as instructed; self-manages medications and denies questions/ concerns around medications today) Any new allergies since your discharge?: No Dietary orders reviewed?: Yes Type of Diet Ordered:: heart healthy/ low salt Do you have support at home?: Yes People in Home: alone Name of Support/Comfort Primary Source: lives alone; reports is independent in  self-care activities, local family assists as/ if needed/ indicated  Home Care and Equipment/Supplies: [place] Ordered?: Yes Name of Home Health Agency:: [phone]- reports ordered per standard protocol post- recent surgery by surgical provider-- confirmed patient has home health contact information as per AVS hospital discharge instructions Has Agency set up a time to come to your home?: No EMR reviewed for Home Health Orders: Orders present/patient has not received call (refer to CM for follow-up) Any new equipment or medical supplies ordered?: No  Functional Questionnaire: Do you need assistance with bathing/showering or dressing?: No Do you need assistance with meal preparation?: No Do you need assistance with eating?: No Do you have difficulty maintaining continence: No Do you need assistance with getting out of bed/getting out of a chair/moving?: No Do you have difficulty managing or taking your medications?: No  Follow up appointments reviewed: PCP Follow-up appointment confirmed?: Yes (care coordination outreach in real-time with scheduling care guide to successfully schedule hospital follow up PCP appointment 07/01/22) Date of PCP follow-up appointment?: 07/01/22 Follow-up Provider: PCP [facility] Follow-up appointment confirmed?: Yes Date of Specialist follow-up appointment?: 06/30/22 Follow-Up Specialty Provider:: 06/30/22-- staple removal/ incision check; 07/15/22- TCTS surgeon Dr. Cyndia Bent; 07/09/22- cardiology Do you need transportation to your follow-up appointment?: No Do you understand care options if your condition(s) worsen?: Yes-patient verbalized understanding  SDOH Interventions Today    Flowsheet Row Most Recent Value  SDOH Interventions   Food Insecurity Interventions Intervention Not Indicated  Transportation Interventions Intervention Not Indicated  [normally drives self,  family assisting with transportation post- recent surgery on  06/16/22]      TOC Interventions Today    Flowsheet Row Most Recent Value  TOC Interventions   TOC Interventions Discussed/Reviewed Post op wound/incision care, TOC Interventions Discussed, S/S of infection, Arranged PCP follow up  within 7 days/Care Guide scheduled  [provided my direct contact information should questions/ concerns/ needs arise post-TOC call, prior to RN CM telephone visit]      Interventions Today    Flowsheet Row Most Recent Value  Chronic Disease   Chronic disease during today's visit Other  [severe AS with surgical AV replacement on 06/16/22]  General Interventions   General Interventions Discussed/Reviewed General Interventions Discussed, Referral to Nurse, Communication with  Doctor Visits Discussed/Reviewed Doctor Visits Discussed, PCP, Specialist  PCP/Specialist Visits Compliance with follow-up visit  Communication with RN  Education Interventions   Education Provided Provided Education  Provided Verbal Education On Other, Medication  [rationale for daily weight monitoring at home aong with weight gain guidelines/ action plan for weight gain,  importance of taking diuretic as prescribed]  Nutrition Interventions   Nutrition Discussed/Reviewed Nutrition Discussed  Pharmacy Interventions   Pharmacy Dicussed/Reviewed Pharmacy Topics Discussed  [Full medication review with updating medication list in EHR per patient report]      Oneta Rack, RN, BSN, CCRN Alumnus RN CM Care Coordination/ Transition of [place] Management [phone]: direct office

## 2022-06-29 ENCOUNTER — Encounter: Payer: Self-pay | Admitting: *Deleted

## 2022-06-29 ENCOUNTER — Telehealth: Payer: Self-pay | Admitting: *Deleted

## 2022-06-29 NOTE — Patient Outreach (Signed)
  Care Coordination   TOC follow up telephone visit for post-TOC care coordination  Visit Note   06/29/2022 Name: Carolyn [name] MRN: [id number] DOB: 13-Sep-1950  Carolyn [name] is a 72 y.o. year old female who sees [name], DO for primary care. I spoke with  [name] by phone today; patient called me this morning, left voice message and requested call back; call was promptly returned with successful connection  What matters to the patients health and wellness today?  "My phone has been having trouble, and I am not sure I can take calls-- I don't know if the home health team has been trying to call me; please call me back when you have a chance"    Goals Addressed             This Visit's Progress    Care Coordination Activities: RN CM Follow up scheduled 07/17/22   On track    "I haven't heard from the home health people yet"  Care coordination activities as follows: -- contacted [place] at contact number listed on AVS:  spoke with Santiago Glad who advises referral was sent to [place], [place], [place] # [phone] -- contacted [facility] for [place], spoke with Maudie Mercury who confirms they have the referral and are just waiting on insurance authorization-- confirms agency has been contacted by [employer] provider and confirms that 3-way call was placed with patient and insurance provider "a few minutes ago" -- contacted patient in follow up: confirmed all of above; and provided direct number for [facility] to patient, who did not have the number after her recent interaction by phone with insurance company earlier this morning  Interventions Today    Flowsheet Row Most Recent Value  Chronic Disease   Chronic disease during today's visit Other  [recent surgery for AVR/ severe aortic stenosis]  General Interventions   General Interventions Discussed/Reviewed General Interventions Discussed, Communication with   [facility] agencies x 2]  Doctor Visits Discussed/Reviewed Specialist, Doctor Visits Discussed  Communication with --  [facility] Home Health agency x 2- [place] and [employer] branches]  Exercise Interventions   Exercise Discussed/Reviewed Physical Activity  Physical Activity Discussed/Reviewed Physical Activity Discussed  [Advised to not over-do activity until she has home health team initial home visit]  Education Interventions   Education Provided Provided Education  [role of home health and importance of engaging with home health team once authorization received from insurance agency,  provided number to [place]: [phone]  Provided Verbal Education On Other            SDOH assessments and interventions completed:  No Previously completed at time of TOC call last week   Care Coordination Interventions:  Yes, provided   Follow up plan: Follow up call scheduled for 07/17/22 with RN CM Care Coordinator    Encounter Outcome:  Pt. Visit Completed   Oneta Rack, RN, BSN, CCRN Alumnus RN CM Care Coordination/ Transition of [place] Management [phone]: direct office

## 2022-06-30 ENCOUNTER — Ambulatory Visit: Payer: Self-pay

## 2022-06-30 ENCOUNTER — Telehealth: Payer: Self-pay | Admitting: Family Medicine

## 2022-06-30 DIAGNOSIS — Z952 Presence of prosthetic heart valve: Secondary | ICD-10-CM

## 2022-06-30 DIAGNOSIS — Z4802 Encounter for removal of sutures: Secondary | ICD-10-CM

## 2022-06-30 NOTE — Progress Notes (Signed)
Patient arrived for nurse visit to remove sutures post- procedure AVR 3/12 with Dr. Cyndia Bent. 2 Sutures removed with no signs/ symptoms of infection noted.  Incisions well approximated. Patient tolerated procedure well.  Patient/ family instructed to keep the incision sites clean and dry.  Patient/ family acknowledged instructions given.

## 2022-06-30 NOTE — Telephone Encounter (Signed)
Pt cld from Rehab facility/just had Heart surgery --Patient wanted to know if referral for Orthotics still open(yes),she ask that we contact her Ins [employer] to see if Orthotics are a covered benefit--cld [id number] spk w/ Bryn Gulling confirmed (they pay 80% after OOP $3600 reached)--cpt L3030 or L3510 cov'd-- Pt responsible for 20% co-ins.Ref# Sandria Senter [id number] @ 11:32am

## 2022-07-01 ENCOUNTER — Encounter: Payer: Self-pay | Admitting: Family Medicine

## 2022-07-01 ENCOUNTER — Ambulatory Visit (INDEPENDENT_AMBULATORY_CARE_PROVIDER_SITE_OTHER): Payer: No Typology Code available for payment source | Admitting: Family Medicine

## 2022-07-01 ENCOUNTER — Telehealth: Payer: Self-pay | Admitting: Family Medicine

## 2022-07-01 VITALS — BP 114/72 | HR 86 | Ht 65.0 in | Wt 168.0 lb

## 2022-07-01 DIAGNOSIS — Z952 Presence of prosthetic heart valve: Secondary | ICD-10-CM

## 2022-07-01 DIAGNOSIS — Z720 Tobacco use: Secondary | ICD-10-CM

## 2022-07-01 DIAGNOSIS — G4734 Idiopathic sleep related nonobstructive alveolar hypoventilation: Secondary | ICD-10-CM | POA: Diagnosis not present

## 2022-07-01 DIAGNOSIS — I700 Atherosclerosis of aorta: Secondary | ICD-10-CM

## 2022-07-01 MED ORDER — AMBULATORY NON FORMULARY MEDICATION: 0 refills | Status: DC

## 2022-07-01 NOTE — Telephone Encounter (Signed)
Carolyn [name] with [employer] called re Patient's appointment today. Fax Home Health Order if needed to fax [phone]

## 2022-07-01 NOTE — Assessment & Plan Note (Signed)
Overall she is doing quite well.  Has been referred to cardiac rehab.  No anginal symptoms.  Remains on amiodarone due to experiencing atrial fibrillation postoperatively.  Home health orders were placed however additional orders are needed for PT OT to evaluate as well as home nursing.  Orders printed and faxed.

## 2022-07-01 NOTE — Telephone Encounter (Signed)
Orders printed to fax over.

## 2022-07-01 NOTE — Assessment & Plan Note (Signed)
Continue to counsel on smoking cessation.  She has no interest in quitting at this time.

## 2022-07-01 NOTE — Progress Notes (Signed)
Carolyn [name] - 72 y.o. female MRN :[id number]  Date of birth: 1950/04/25  Subjective Chief Complaint  Patient presents with   Hospitalization Follow-up    HPI Carolyn [name] is a 72 y.o. female here today for hospital follow up.  Seen in the ED on 06/01/2022 with chest discomfort and dizziness.  Repeat echocardiogram showed a functional bicuspid aortic valve with markedly thickened leaflets.  Cardiac catheterization showed patent coronary arteries with mild nonobstructive disease.  Evaluated for consideration of TAVR continue to have exertional fatigue and shortness of breath.  Underwent elective aortic valve replacement on 06/16/2022.  Initially transferred to the ICU in stable condition.  She was weaned from ventilator support but developed a productive cough and mild leukocytosis.  Started on empiric cefepime.  Aggressive pulmonary toilet provided.  Cough cleared and chest x-ray improved with treatment.  She did develop atrial fibrillation during her hospitalization which was treated with IV amiodarone with conversion back to sinus rhythm.  She was transition to oral amiodarone which was continued at discharge.  Referral made to cardiac rehab.  Smoking cessation counseling provided however she has no plans to discontinue smoking.  She reports she is feeling pretty well.  She denies chest pain, palpitations.  She does continue to have some fatigue and mild shortness of breath.  ROS:  A comprehensive ROS was completed and negative except as noted per HPI  Allergies  Allergen Reactions   Trazodone And Nefazodone Shortness Of Breath   Azithromycin Rash    Past Medical History:  Diagnosis Date   Anxiety    Arthritis    BCC (basal cell carcinoma) 08/05/2015   left neck   BCC (basal cell carcinoma) infilt 02/20/2016   right ala nasal   BCC (basal cell carcinoma) sup&nod 06/03/2016   left lower sternum   BCC (basal cell carcinoma) ulcerated 03/19/2016   mid chest between breasts    Cervical cancer (HCC)    Cervical cancer (HCC)    COPD (chronic obstructive pulmonary disease) ([place])    Depression    Heart murmur    Hyperlipidemia    Hypothyroidism    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   right shoulder blade   Neuromuscular disorder ([place])    Restless Leg Syndrome   Pneumonia 2019   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   left nare   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   left upper shin   SCC (squamous cell carcinoma) in situ 03/19/2016   right pinky   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   right thigh, left upper shin   SCC (squamous cell carcinoma) well diff 12/13/2008   left hand   SCC (squamous cell carcinoma) well diff x2 12/25/2015   left and right forearm   Thyroid disease     Past Surgical History:  Procedure Laterality Date   ANKLE SURGERY Right 2000   repaired tendons with pin placed   AORTIC VALVE REPLACEMENT N/A 06/16/2022   Procedure: AORTIC VALVE REPLACEMENT (AVR) USING EDWARDS INSPIRIS SIZE 21MM;  Surgeon: [name], MD;  Location: [facility];  Service: Open Heart Surgery;  Laterality: N/A;   CERVICAL CONIZATION W/BX     COLONOSCOPY  2014   EYE SURGERY Bilateral    Cataracts   MELANOMA EXCISION  08/2014   back   RIGHT HEART CATH AND CORONARY ANGIOGRAPHY N/A 06/02/2022   Procedure: RIGHT HEART  CATH AND CORONARY ANGIOGRAPHY;  Surgeon: Sherren Mocha, MD;  Location: [facility];  Service: Cardiovascular;  Laterality: N/A;   SHOULDER SURGERY Right    x2   SHOULDER SURGERY Left    TEE WITHOUT CARDIOVERSION N/A 06/16/2022   Procedure: TRANSESOPHAGEAL ECHOCARDIOGRAM;  Surgeon: [name], MD;  Location: [facility] OR;  Service: Open Heart Surgery;  Laterality: N/A;   THUMB ARTHROSCOPY Right    Removed bone in thumb    Social History   Socioeconomic History   Marital status: Divorced    Spouse name: Not on file   Number of  children: 2   Years of education: Not on file   Highest education level: GED or equivalent  Occupational History   Occupation: disabled  Tobacco Use   Smoking status: Every Day    Packs/day: .5    Types: Cigarettes    Start date: 04/06/1968    Passive exposure: Never   Smokeless tobacco: Never  Vaping Use   Vaping Use: Never used  Substance and Sexual Activity   Alcohol use: Never   Drug use: No   Sexual activity: Not on file  Other Topics Concern   Not on file  Social History Narrative   Lives alone   R handed   Caffeine: half a litter of soda in a day.    Social Determinants of Health   Financial Resource Strain: Low Risk  (06/30/2022)   Overall Financial Resource Strain (CARDIA)    Difficulty of Paying Living Expenses: Not very hard  Food Insecurity: No Food Insecurity (06/30/2022)   Hunger Vital Sign    Worried About Running Out of Food in the Last Year: Never true    Ran Out of Food in the Last Year: Never true  Transportation Needs: No Transportation Needs (06/30/2022)   PRAPARE - Hydrologist (Medical): No    Lack of Transportation (Non-Medical): No  Physical Activity: Inactive (05/12/2018)   Exercise Vital Sign    Days of Exercise per Week: 0 days    Minutes of Exercise per Session: 0 min  Stress: No Stress Concern Present (06/30/2022)   [place]    Feeling of Stress : Only a little  Social Connections: Moderately Integrated (06/30/2022)   Social Connection and Isolation Panel [NHANES]    Frequency of Communication with Friends and Family: More than three times a week    Frequency of Social Gatherings with Friends and Family: Once a week    Attends Religious Services: 1 to 4 times per year    Active Member of [employer] or Organizations: Yes    Attends Archivist Meetings: 1 to 4 times per year    Marital Status: Divorced    Family History  Problem Relation Age  of Onset   Heart disease Mother    Aneurysm Mother    Heart disease Father    Kidney failure Father    Cancer Sister    Cancer Brother    Cirrhosis Daughter    Cancer Sister        Bronchial    Health Maintenance  Topic Date Due   Pneumonia Vaccine 30+ Years old (14 of 3 - PPSV23 or PCV20) 07/16/2022 (Originally 01/21/2019)   COVID-19 Vaccine (6 - 2023-24 season) 01/01/2023 (Originally 12/05/2021)   Medicare Annual Wellness ([place])  01/01/2023 (Originally 05/09/2021)   Lung Cancer Screening  06/04/2023   MAMMOGRAM  01/23/2024   COLONOSCOPY (Pts  45-82yrs Insurance coverage will need to be confirmed)  09/10/2025   DTaP/Tdap/Td (3 - Td or Tdap) 01/04/2031   INFLUENZA VACCINE  Completed   DEXA SCAN  Completed   Hepatitis C Screening  Completed   Zoster Vaccines- Shingrix  Completed   HPV VACCINES  Aged Out     ----------------------------------------------------------------------------------------------------------------------------------------------------------------------------------------------------------------- Physical Exam BP 114/72 (BP Location: Left Arm, Patient Position: Sitting, Cuff Size: Large)   Pulse 86   Ht 5\' 5"  (1.651 m)   Wt 168 lb (76.2 kg)   SpO2 94%   BMI 27.96 kg/m   Physical Exam Constitutional:      Appearance: Normal appearance.  HENT:     Head: Normocephalic and atraumatic.  Eyes:     General: No scleral icterus. Cardiovascular:     Rate and Rhythm: Normal rate and regular rhythm.  Pulmonary:     Effort: Pulmonary effort is normal.     Breath sounds: Normal breath sounds.  Musculoskeletal:     Cervical back: Neck supple.  Neurological:     Mental Status: She is alert.  Psychiatric:        Mood and Affect: Mood normal.        Behavior: Behavior normal.      ------------------------------------------------------------------------------------------------------------------------------------------------------------------------------------------------------------------- Assessment and Plan  S/P aortic valve replacement Overall she is doing quite well.  Has been referred to cardiac rehab.  No anginal symptoms.  Remains on amiodarone due to experiencing atrial fibrillation postoperatively.  Home health orders were placed however additional orders are needed for PT OT to evaluate as well as home nursing.  Orders printed and faxed.  Tobacco abuse Continue to counsel on smoking cessation.  She has no interest in quitting at this time.  Nocturnal oxygen desaturation Has upcoming sleep study.   Meds ordered this encounter  Medications   AMBULATORY NON FORMULARY MEDICATION    Sig: Home health:  Physical therapy-Evaluate and treat, Occupational therapy-Evaluate and treat, Nursing-Teaching on heart healthy diet, medications,    Dispense:  1 Units    Refill:  0    No follow-ups on file.    This visit occurred during the SARS-CoV-2 public health emergency.  Safety protocols were in place, including screening questions prior to the visit, additional usage of staff PPE, and extensive cleaning of exam room while observing appropriate contact time as indicated for disinfecting solutions.

## 2022-07-01 NOTE — Assessment & Plan Note (Signed)
Has upcoming sleep study.

## 2022-07-01 NOTE — Telephone Encounter (Signed)
Left VM for patient to call back and let me know if she has heard from sleep study.

## 2022-07-02 ENCOUNTER — Ambulatory Visit: Payer: No Typology Code available for payment source | Admitting: Family Medicine

## 2022-07-02 DIAGNOSIS — Z0000 Encounter for general adult medical examination without abnormal findings: Secondary | ICD-10-CM | POA: Diagnosis not present

## 2022-07-02 DIAGNOSIS — R69 Illness, unspecified: Secondary | ICD-10-CM | POA: Diagnosis not present

## 2022-07-02 LAB — COMPLETE METABOLIC PANEL WITH GFR
AG Ratio: 1.3 (calc) (ref 1.0–2.5)
ALT: 14 U/L (ref 6–29)
AST: 21 U/L (ref 10–35)
Albumin: 4.1 g/dL (ref 3.6–5.1)
Alkaline phosphatase (APISO): 118 U/L (ref 37–153)
BUN: 25 mg/dL (ref 7–25)
CO2: 28 mmol/L (ref 20–32)
Calcium: 9.6 mg/dL (ref 8.6–10.4)
Chloride: 97 mmol/L — ABNORMAL LOW (ref 98–110)
Creat: 1 mg/dL (ref 0.50–1.05)
Globulin: 3.1 g/dL (calc) (ref 1.9–3.7)
Glucose, Bld: 120 mg/dL — ABNORMAL HIGH (ref 65–99)
Potassium: 4.7 mmol/L (ref 3.5–5.3)
Sodium: 137 mmol/L (ref 135–146)
Total Bilirubin: 0.4 mg/dL (ref 0.2–1.2)
Total Protein: 7.2 g/dL (ref 6.1–8.1)
eGFR: 61 mL/min/{1.73_m2} (ref >=60)

## 2022-07-02 LAB — CBC WITH DIFFERENTIAL/PLATELET
Absolute Monocytes: 626 cells/uL (ref 200–950)
Basophils Absolute: 111 cells/uL (ref 0–200)
Basophils Relative: 1.1 %
Eosinophils Absolute: 253 cells/uL (ref 15–500)
Eosinophils Relative: 2.5 %
HCT: 36.6 % (ref 35.0–45.0)
Hemoglobin: 11.9 g/dL (ref 11.7–15.5)
Lymphs Abs: 1495 cells/uL (ref 850–3900)
MCH: 29.4 pg (ref 27.0–33.0)
MCHC: 32.5 g/dL (ref 32.0–36.0)
MCV: 90.4 fL (ref 80.0–100.0)
MPV: 9.7 fL (ref 7.5–12.5)
Monocytes Relative: 6.2 %
Neutro Abs: 7615 cells/uL (ref 1500–7800)
Neutrophils Relative %: 75.4 %
Platelets: 403 10*3/uL — ABNORMAL HIGH (ref 140–400)
RBC: 4.05 10*6/uL (ref 3.80–5.10)
RDW: 13.4 % (ref 11.0–15.0)
Total Lymphocyte: 14.8 %
WBC: 10.1 10*3/uL (ref 3.8–10.8)

## 2022-07-02 NOTE — Progress Notes (Signed)
MEDICARE ANNUAL WELLNESS VISIT  07/02/2022  Telephone Visit Disclaimer This Medicare AWV was conducted by telephone due to national recommendations for restrictions regarding the COVID-19 Pandemic (e.g. social distancing).  I verified, using two identifiers, that I am speaking with Carolyn [name] or their authorized healthcare agent. I discussed the limitations, risks, security, and privacy concerns of performing an evaluation and management service by telephone and the potential availability of an in-person appointment in the future. The patient expressed understanding and agreed to proceed.  Location of Patient: Home Location of Provider (nurse):  Provider home  Subjective:    [name] is a 72 y.o. female patient of Carolyn Nutting, DO who had a Medicare Annual Wellness Visit today via telephone. Carolyn [name] is Retired and lives alone. she has 1 (living) children. she reports that she is socially active and does interact with friends/family regularly. she is minimally physically active and enjoys gardening.  Patient Care Team: Carolyn Nutting, DO as PCP - General (Family Medicine) Stanford Breed Denice Bors, MD as PCP - Cardiology (Cardiology) Pcp, No Netta Cedars, MD as Consulting Physician (Orthopedic Surgery) Mora Bellman, MD as Consulting Physician (Obstetrics and Gynecology)     07/02/2022    3:07 PM 06/16/2022    5:51 AM 06/12/2022    1:58 PM 06/01/2022    6:31 PM 06/01/2022    9:14 AM 11/24/2021    2:43 PM 04/09/2021   10:41 AM  Advanced Directives  Does Patient Have a Medical Advance Directive? Yes Yes Yes Yes Yes Yes Yes  Type of Advance Directive Living will [place];Living will [place];Living will [place];Living will  [place];Living will Living will;Healthcare Power of Attorney  Does patient want to make changes to medical advance directive? No - Patient declined No - Patient declined  No -  Patient declined   No - Patient declined  Copy of [place] in Chart? No - copy requested No - copy requested Yes - validated most recent copy scanned in chart (See row information) No - copy requested  No - copy requested No - copy requested    Hospital Utilization Over the Past 12 Months: # of hospitalizations or ER visits: 1 # of surgeries: 1  Review of Systems    Patient reports that her overall health is worse compared to last year.  History obtained from chart review and the patient  Patient Reported Readings (BP, Pulse, CBG, Weight, etc) none  Pain Assessment Pain : No/denies pain     Current Medications & Allergies (verified) Allergies as of 07/02/2022       Reactions   Trazodone And Nefazodone Shortness Of Breath   Azithromycin Rash        Medication List        Accurate as of July 02, 2022  3:23 PM. If you have any questions, ask your nurse or doctor.          albuterol 108 (90 Base) MCG/ACT inhaler Commonly known as: VENTOLIN HFA Inhale 2 puffs into the lungs every 6 (six) hours as needed for wheezing or shortness of breath.   AMBULATORY NON FORMULARY MEDICATION Home health:  Physical therapy-Evaluate and treat, Occupational therapy-Evaluate and treat, Nursing-Teaching on heart healthy diet, medications,   amiodarone 200 MG tablet Commonly known as: PACERONE Take 1 tablet (200 mg total) by mouth 2 (two) times daily.   ascorbic acid 500 MG tablet Commonly known as: VITAMIN C Take 500 mg by  mouth daily.   aspirin EC 81 MG tablet TAKE 1 TABLET BY MOUTH EVERY DAY   atorvastatin 10 MG tablet Commonly known as: LIPITOR TAKE 1 TABLET BY MOUTH EVERY DAY   baclofen 10 MG tablet Commonly known as: LIORESAL TAKE 1 TABLET BY MOUTH THREE TIMES A DAY What changed:  when to take this reasons to take this   Breo Ellipta 100-25 MCG/ACT Aepb Generic drug: fluticasone furoate-vilanterol TAKE 1 PUFF BY MOUTH EVERY DAY   CALCIUM 600 +  D PO Take 1 tablet by mouth 2 (two) times daily.   CENTRUM SILVER ADULT 50+ PO Take 1 tablet by mouth daily.   clonazePAM 0.5 MG tablet Commonly known as: KLONOPIN TAKE 1 TABLET BY MOUTH EVERYDAY AT BEDTIME   Dialyvite Vitamin D 5000 125 MCG (5000 UT) capsule Generic drug: Cholecalciferol Take 5,000 Units by mouth daily.   ferrous sulfate 325 (65 FE) MG EC tablet Take 1 tablet (325 mg total) by mouth daily with breakfast.   Fish Oil 1200 MG Caps Take 1,200 mg by mouth daily.   fluticasone 50 MCG/ACT nasal spray Commonly known as: FLONASE INSTILL 1 SPRAY INTO BOTH NOSTRILS DAILY What changed:  how much to take how to take this when to take this additional instructions   furosemide 40 MG tablet Commonly known as: LASIX Take 1 tablet (40 mg total) by mouth daily.   Garlic 123XX123 MG Caps Take 1,000 mg by mouth daily.   levothyroxine 100 MCG tablet Commonly known as: SYNTHROID TAKE 1 TABLET BY MOUTH DAILY BEFORE BREAKFAST.   magnesium hydroxide 400 MG/5ML suspension Commonly known as: MILK OF MAGNESIA Take 15 mLs by mouth daily as needed for mild constipation.   midodrine 5 MG tablet Commonly known as: PROAMATINE Take 1 tablet (5 mg total) by mouth 3 (three) times daily with meals. For 7 days then decrease the dose to 1 tablet (5mg ) twice daily   potassium chloride SA 20 MEQ tablet Commonly known as: KLOR-CON M Take 1 tablet (20 mEq total) by mouth daily for 7 days.   pregabalin 75 MG capsule Commonly known as: LYRICA Take 1 capsule (75 mg total) by mouth at bedtime as needed. What changed: when to take this   rOPINIRole 3 MG tablet Commonly known as: REQUIP TAKE 1 TABLET BY MOUTH AT BEDTIME.   zinc gluconate 50 MG tablet Take 50 mg by mouth daily.        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   right shoulder blade   Neuromuscular disorder ([place])    Restless Leg Syndrome   Pneumonia 2019   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   left nare   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   left upper shin   SCC (squamous  cell carcinoma) in situ 03/19/2016   right pinky   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   right thigh, left upper shin   SCC (squamous cell carcinoma) well diff 12/13/2008   left hand   SCC (squamous cell carcinoma) well diff x2 12/25/2015   left and right forearm   Thyroid disease    Past Surgical History:  Procedure Laterality Date   ANKLE SURGERY Right 2000   repaired tendons with pin placed   AORTIC VALVE REPLACEMENT N/A 06/16/2022   Procedure: AORTIC VALVE REPLACEMENT (AVR) USING EDWARDS INSPIRIS SIZE 21MM;  Surgeon: [name], MD;  Location: [facility];  Service: Open Heart Surgery;  Laterality: N/A;   CERVICAL CONIZATION W/BX     COLONOSCOPY  2014   EYE SURGERY Bilateral    Cataracts   MELANOMA EXCISION  08/2014   back   RIGHT HEART CATH AND CORONARY ANGIOGRAPHY N/A 06/02/2022   Procedure: RIGHT HEART CATH AND CORONARY ANGIOGRAPHY;  Surgeon: Sherren Mocha, MD;  Location: [facility];  Service: Cardiovascular;  Laterality: N/A;   SHOULDER SURGERY Right    x2   SHOULDER SURGERY Left    TEE WITHOUT CARDIOVERSION N/A 06/16/2022   Procedure: TRANSESOPHAGEAL ECHOCARDIOGRAM;  Surgeon: [name], MD;  Location: [facility];  Service: Open Heart Surgery;  Laterality: N/A;    THUMB ARTHROSCOPY Right    Removed bone in thumb   Family History  Problem Relation Age of Onset   Heart disease Mother    Aneurysm Mother    Heart disease Father    Kidney failure Father    Cancer Sister    COPD Sister    Cancer Brother    Cirrhosis Daughter    Cancer Sister        Bronchial   Social History   Socioeconomic History   Marital status: Divorced    Spouse name: Not on file   Number of children: 2   Years of education: GED   Highest education level: GED or equivalent  Occupational History   Occupation: disabled  Tobacco Use   Smoking status: Every Day    Packs/day: 0.50    Years: 54.00    Additional pack years: 0.00    Total pack years: 27.00    Types: Cigarettes    Start date: 04/06/1968    Passive exposure: Never   Smokeless tobacco: Never  Vaping Use   Vaping Use: Never used  Substance and Sexual Activity   Alcohol use: No   Drug use: No   Sexual activity: Not Currently    Partners: Male    Birth control/protection: None  Other Topics Concern   Not on file  Social History Narrative   Lives alone. She had two children, one is deceased. Her son lives in [place]. She has a sister who lives close by who helps her a lot. Her enjoys gardening.    Social Determinants of Health   Financial Resource Strain: Low Risk  (07/02/2022)   Overall Financial Resource Strain (CARDIA)    Difficulty of Paying Living Expenses: Not hard at all  Food Insecurity: No Food Insecurity (07/02/2022)   Hunger Vital Sign    Worried About Running Out of Food in the Last Year: Never true    Ran Out of Food in the Last Year: Never true  Transportation Needs: No Transportation Needs (07/02/2022)   PRAPARE - Hydrologist (Medical): No    Lack of Transportation (Non-Medical):  No  Physical Activity: Inactive (07/02/2022)   Exercise Vital Sign    Days of Exercise per Week: 3 days    Minutes of Exercise per Session: 0 min  Stress: No Stress  Concern Present (07/02/2022)   [place]    Feeling of Stress : Not at all  Social Connections: Moderately Integrated (07/02/2022)   Social Connection and Isolation Panel [NHANES]    Frequency of Communication with Friends and Family: More than three times a week    Frequency of Social Gatherings with Friends and Family: Once a week    Attends Religious Services: 1 to 4 times per year    Active Member of [employer] or Organizations: Yes    Attends Archivist Meetings: Never    Marital Status: Divorced    Activities of Daily Living    07/02/2022    6:11 AM 06/18/2022    3:00 PM  In your present state of health, do you have any difficulty performing the following activities:  Hearing? 0 0  Vision? 0 0  Difficulty concentrating or making decisions? 0 0  Walking or climbing stairs? 0 0  Dressing or bathing? 0 0  Doing errands, shopping? 0 0  Preparing Food and eating ? N   Using the Toilet? N   In the past six months, have you accidently leaked urine? Y   Do you have problems with loss of bowel control? N   Managing your Medications? N   Managing your Finances? N   Housekeeping or managing your Housekeeping? N     Patient Education/ Literacy How often do you need to have someone help you when you read instructions, pamphlets, or other written materials from your doctor or pharmacy?: 1 - Never What is the last grade level you completed in school?: GED  Exercise Current Exercise Habits: The patient does not participate in regular exercise at present, Exercise limited by: cardiac condition(s)  Diet Patient reports consuming 3 meals a day and 1-2 snack(s) a day Patient reports that her primary diet is: Regular Patient reports that she does have regular access to food.   Depression Screen    07/02/2022    3:08 PM 07/01/2022    4:09 PM 05/18/2022   11:15 AM 11/14/2021   10:35 AM 07/15/2021   10:43 AM 04/09/2021    10:43 AM 05/12/2018    1:04 PM  PHQ 2/9 Scores  PHQ - 2 Score 0 0 0 0 0 5 0  PHQ- 9 Score  2  2 2 12       Fall Risk    07/02/2022    3:08 PM 07/02/2022    6:11 AM 05/18/2022   11:15 AM 11/14/2021   10:35 AM 07/15/2021   10:43 AM  Fall Risk   Falls in the past year? 0 0 0 0 0  Number falls in past yr: 0 0 0 0 0  Injury with Fall? 0 0 0 0 0  Risk for fall due to : No Fall Risks  No Fall Risks No Fall Risks No Fall Risks  Follow up Falls evaluation completed  Falls evaluation completed Falls evaluation completed Falls evaluation completed     Objective:  Carolyn [name] seemed alert and oriented and she participated appropriately during our telephone visit.  Blood Pressure Weight BMI  BP Readings from Last 3 Encounters:  07/01/22 114/72  06/24/22 (!) 92/56  06/12/22 131/78   Wt Readings from Last 3 Encounters:  07/01/22 168 lb (76.2 kg)  06/24/22 180 lb 4.8 oz (81.8 kg)  06/12/22 168 lb 9.6 oz (76.5 kg)   BMI Readings from Last 1 Encounters:  07/01/22 27.96 kg/m    *Unable to obtain current vital signs, weight, and BMI due to telephone visit type  Hearing/Vision  Summerlynn did not seem to have difficulty with hearing/understanding during the telephone conversation Reports that she has had a formal eye exam by an eye care professional within the past year Reports that she has not had a formal hearing evaluation within the past year *Unable to fully assess hearing and vision during telephone visit type  Cognitive Function:    07/02/2022    3:14 PM  6CIT Screen  What Year? 0 points  What month? 0 points  What time? 0 points  Count back from 20 0 points  Months in reverse 0 points  Repeat phrase 2 points  Total Score 2 points   (Normal:0-7, Significant for Dysfunction: >8)  Normal Cognitive Function Screening: Yes   Immunization & Health Maintenance Record Immunization History  Administered Date(s) Administered   Fluad Quad(high Dose 65+) 12/05/2019, 01/03/2021    Influenza, Seasonal, Injecte, Preservative Fre 11/26/2015, 11/21/2016, 11/17/2017   Influenza-Unspecified 11/26/2015, 11/21/2016, 11/17/2017, 12/05/2021   PFIZER(Purple Top)SARS-COV-2 Vaccination 06/05/2019, 07/03/2019, 01/03/2020, 07/12/2020, 01/21/2021   PNEUMOCOCCAL CONJUGATE-20 12/24/2021   Pneumococcal Conjugate-13 01/20/2018   Pneumococcal Polysaccharide-23 05/07/2010   Pneumococcal-Unspecified 06/02/2015   Tdap 06/02/2015, 01/03/2021   Zoster Recombinat (Shingrix) 05/12/2018, 07/16/2018   Zoster, Live 11/26/2015    Health Maintenance  Topic Date Due   COVID-19 Vaccine (6 - 2023-24 season) 01/01/2023 (Originally 12/05/2021)   Lung Cancer Screening  06/04/2023   Medicare Annual Wellness (AWV)  07/02/2023   MAMMOGRAM  01/23/2024   COLONOSCOPY (Pts 45-61yrs Insurance coverage will need to be confirmed)  09/10/2025   DTaP/Tdap/Td (3 - Td or Tdap) 01/04/2031   Pneumonia Vaccine 63+ Years old  Completed   INFLUENZA VACCINE  Completed   DEXA SCAN  Completed   Hepatitis C Screening  Completed   Zoster Vaccines- Shingrix  Completed   HPV VACCINES  Aged Out       Assessment  This is a routine wellness examination for [employer].  Health Maintenance: Due or Overdue There are no preventive care reminders to display for this patient.  Sidnee Mapes does not need a referral for [employer] Assistance: Care Management:   no Social Work:    no Prescription Assistance:  no Nutrition/Diabetes Education:  no   Plan:  Personalized Goals  Goals Addressed               This Visit's Progress     Patient Stated (pt-stated)        Patient stated that she would like to be able to get her strength back up after heart surgery.       Personalized Health Maintenance & Screening Recommendations  There are no preventive care reminders to display for this patient.  Lung Cancer Screening Recommended: yes (Low Dose CT Chest recommended if Age 30-80 years, 30 pack-year currently  smoking OR have quit w/in past 15 years) Hepatitis C Screening recommended: no HIV Screening recommended: no  Advanced Directives: Written information was not prepared per patient's request.  Referrals & Orders No orders of the defined types were placed in this encounter.   Follow-up Plan Follow-up with Carolyn Nutting, DO as planned Medicare wellness visit in one year.  Patient will access AVS on my chart.  I have personally reviewed and noted the following in the patient's chart:   Medical and social history Use of alcohol, tobacco or illicit drugs  Current medications and supplements Functional ability and status Nutritional status Physical activity Advanced directives List of other physicians Hospitalizations, surgeries, and ER visits in previous 12 months Vitals Screenings to include cognitive, depression, and falls Referrals and appointments  In addition, I have reviewed and discussed with Carolyn [name] certain preventive protocols, quality metrics, and best practice recommendations. A written personalized care plan for preventive services as well as general preventive health recommendations is available and can be mailed to the patient at her request.      Tinnie Gens, RN BSN  07/02/2022

## 2022-07-02 NOTE — Patient Instructions (Signed)
[name] Summary and Written [name] of Care  Carolyn [name] ,  Thank you for allowing me to perform your Medicare Annual Wellness Visit and for your ongoing commitment to your health.   Health Maintenance & Immunization History Health Maintenance  Topic Date Due   COVID-19 Vaccine (6 - 2023-24 season) 01/01/2023 (Originally 12/05/2021)   Lung Cancer Screening  06/04/2023   Medicare Annual Wellness (AWV)  07/02/2023   MAMMOGRAM  01/23/2024   COLONOSCOPY (Pts 45-76yrs Insurance coverage will need to be confirmed)  09/10/2025   DTaP/Tdap/Td (3 - Td or Tdap) 01/04/2031   Pneumonia Vaccine 56+ Years old  Completed   INFLUENZA VACCINE  Completed   DEXA SCAN  Completed   Hepatitis C Screening  Completed   Zoster Vaccines- Shingrix  Completed   HPV VACCINES  Aged Out   Immunization History  Administered Date(s) Administered   Fluad Quad(high Dose 65+) 12/05/2019, 01/03/2021   Influenza, Seasonal, Injecte, Preservative Fre 11/26/2015, 11/21/2016, 11/17/2017   Influenza-Unspecified 11/26/2015, 11/21/2016, 11/17/2017, 12/05/2021   PFIZER(Purple Top)SARS-COV-2 Vaccination 06/05/2019, 07/03/2019, 01/03/2020, 07/12/2020, 01/21/2021   PNEUMOCOCCAL CONJUGATE-20 12/24/2021   Pneumococcal Conjugate-13 01/20/2018   Pneumococcal Polysaccharide-23 05/07/2010   Pneumococcal-Unspecified 06/02/2015   Tdap 06/02/2015, 01/03/2021   Zoster Recombinat (Shingrix) 05/12/2018, 07/16/2018   Zoster, Live 11/26/2015    These are the patient goals that we discussed:  Goals Addressed               This Visit's Progress     Patient Stated (pt-stated)        Patient stated that she would like to be able to get her strength back up after heart surgery.         This is a list of Health Maintenance Items that are overdue or due now: There are no preventive care reminders to display for this patient.   Orders/Referrals Placed Today: No orders of the defined  types were placed in this encounter.  (Contact our referral department at [phone] if you have not spoken with someone about your referral appointment within the next 5 days)    Follow-up [name] Follow-up with [name], DO as planned Medicare wellness visit in one year.  Patient will access AVS on my chart.     Health Maintenance, Female Adopting a healthy lifestyle and getting preventive care are important in promoting health and wellness. Ask your health care provider about: The right schedule for you to have regular tests and exams. Things you can do on your own to prevent diseases and keep yourself healthy. What should I know about diet, weight, and exercise? Eat a healthy diet  Eat a diet that includes plenty of vegetables, fruits, low-fat dairy products, and lean protein. Do not eat a lot of foods that are high in solid fats, added sugars, or sodium. Maintain a healthy weight Body mass index (BMI) is used to identify weight problems. It estimates body fat based on height and weight. Your health care provider can help determine your BMI and help you achieve or maintain a healthy weight. Get regular exercise Get regular exercise. This is one of the most important things you can do for your health. Most adults should: Exercise for at least 150 minutes each week. The exercise should increase your heart rate and make you sweat (moderate-intensity exercise). Do strengthening exercises at least twice a week. This is in addition to the moderate-intensity exercise. Spend less time sitting. Even light physical activity can be beneficial. Watch cholesterol  and blood lipids Have your blood tested for lipids and cholesterol at 70 years of age, then have this test every 5 years. Have your cholesterol levels checked more often if: Your lipid or cholesterol levels are high. You are older than 70 years of age. You are at high risk for heart disease. What should I know about cancer  screening? Depending on your health history and family history, you may need to have cancer screening at various ages. This may include screening for: Breast cancer. Cervical cancer. Colorectal cancer. Skin cancer. Lung cancer. What should I know about heart disease, diabetes, and high blood pressure? Blood pressure and heart disease High blood pressure causes heart disease and increases the risk of stroke. This is more likely to develop in people who have high blood pressure readings or are overweight. Have your blood pressure checked: Every 3-5 years if you are 68-49 years of age. Every year if you are 23 years old or older. Diabetes Have regular diabetes screenings. This checks your fasting blood sugar level. Have the screening done: Once every three years after age 16 if you are at a normal weight and have a low risk for diabetes. More often and at a younger age if you are overweight or have a high risk for diabetes. What should I know about preventing infection? Hepatitis B If you have a higher risk for hepatitis B, you should be screened for this virus. Talk with your health care provider to find out if you are at risk for hepatitis B infection. Hepatitis C Testing is recommended for: Everyone born from [birth year] through 1963. Anyone with known risk factors for hepatitis C. Sexually transmitted infections (STIs) Get screened for STIs, including gonorrhea and chlamydia, if: You are sexually active and are younger than 71 years of age. You are older than 70 years of age and your health care provider tells you that you are at risk for this type of infection. Your sexual activity has changed since you were last screened, and you are at increased risk for chlamydia or gonorrhea. Ask your health care provider if you are at risk. Ask your health care provider about whether you are at high risk for HIV. Your health care provider may recommend a prescription medicine to help prevent HIV  infection. If you choose to take medicine to prevent HIV, you should first get tested for HIV. You should then be tested every 3 months for as long as you are taking the medicine. Pregnancy If you are about to stop having your period (premenopausal) and you may become pregnant, seek counseling before you get pregnant. Take 400 to 800 micrograms (mcg) of folic acid every day if you become pregnant. Ask for birth control (contraception) if you want to prevent pregnancy. Osteoporosis and menopause Osteoporosis is a disease in which the bones lose minerals and strength with aging. This can result in bone fractures. If you are 38 years old or older, or if you are at risk for osteoporosis and fractures, ask your health care provider if you should: Be screened for bone loss. Take a calcium or vitamin D supplement to lower your risk of fractures. Be given hormone replacement therapy (HRT) to treat symptoms of menopause. Follow these instructions at home: Alcohol use Do not drink alcohol if: Your health care provider tells you not to drink. You are pregnant, may be pregnant, or are planning to become pregnant. If you drink alcohol: Limit how much you have to: 0-1 drink a day. Know  how much alcohol is in your drink. In the [place] one drink equals one 12 oz bottle of beer (355 mL), one 5 oz glass of wine (148 mL), or one 1 oz glass of hard liquor (44 mL). Lifestyle Do not use any products that contain nicotine or tobacco. These products include cigarettes, chewing tobacco, and vaping devices, such as e-cigarettes. If you need help quitting, ask your health care provider. Do not use street drugs. Do not share needles. Ask your health care provider for help if you need support or information about quitting drugs. General instructions Schedule regular health, dental, and eye exams. Stay current with your vaccines. Tell your health care provider if: You often feel depressed. You have ever been abused  or do not feel safe at home. Summary Adopting a healthy lifestyle and getting preventive care are important in promoting health and wellness. Follow your health care provider's instructions about healthy diet, exercising, and getting tested or screened for diseases. Follow your health care provider's instructions on monitoring your cholesterol and blood pressure. This information is not intended to replace advice given to you by your health care provider. Make sure you discuss any questions you have with your health care provider. Document Revised: 08/12/2020 Document Reviewed: 08/12/2020 Elsevier Patient Education  [place].

## 2022-07-07 ENCOUNTER — Telehealth: Payer: Self-pay | Admitting: Family Medicine

## 2022-07-07 NOTE — Progress Notes (Unsigned)
Cardiology Clinic Note   Date: 07/09/2022 ID: Carolyn [name], DOB [birth year], MRN [id number]  Primary Cardiologist:  Kirk Ruths, MD  Patient Profile    Carolyn [name] is a 72 y.o. female who presents to the clinic today for hospital follow-up s/p AVR.  Past medical history significant for: Nonobstructive CAD. R/LHC 06/02/2022: Mild nonobstructive plaque in the mid LAD otherwise normal coronary arteries. Coronary CTA 06/03/2022 (aortic stenosis evaluation): Calcium score 319. Severe aortic stenosis. Echo 06/02/2022: EF 65 to 70%.  Grade I DD.  Left atrial size upper limits of normal.  Trivial MR.  Bicuspid aortic valve.  Mild to moderate AI.  Moderate to severe AS, mean gradient 33.5 mmHg. R/LHC 06/02/2022: Normal right heart hemodynamics with preserved cardiac output. 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 COPD. Hypothyroidism. Abdominal bruit. AAA ultrasound 05/05/2022: No evidence of AAA.  Largest aortic measurement 2.1 cm.  Patent IVC. Tobacco abuse. Prediabetes.   History of Present Illness    Carolyn [name] was first evaluated by Dr. Stanford Breed on 04/20/2022 for aortic stenosis at the request of Dr. Zigmund Daniel.  Chest CT October 2019 showed aortic atherosclerosis without aneurysm formation.  She had recently been noted to have a murmur.  Echo September 2023 showed normal LV function, Grade I DD, moderate LAE, moderate to severe aortic stenosis with mean gradient 39 mmHg, mild AI, PFO noted.  At that time patient noted mild DOE without orthopnea, PND, pedal edema, exertional chest pain, or syncope.  Abdominal bruit was heard on exam and patient reported history of AAA repair.  No evidence of AAA per ultrasound.  Plan for patient to monitor symptoms and undergo repeat echo in 6 months.    Patient presented to the ED via EMS on 06/01/2022 with complaints of centralized chest pain that had been going on for weeks.  She reported weakness and near syncopal episode prior to transport to ER.  Labs  unremarkable.  Troponin negative x 2.  TSH elevated.  Chest x-ray showed bibasilar patchy opacities likely atelectasis.  Patient was admitted with plan to undergo [facility] (as outlined above).  Patient was discharged on 06/03/2022 to follow-up with Dr. Caffie Pinto to CTS for further AVR evaluation.  Patient underwent AVR 06/16/2022.  Patient developed postoperative A-fib that converted with IV amiodarone.  She was transitioned to oral amiodarone.  Today, patient is alone. She reports overall she has been doing well since she got out of the hospital. No chest pain, pressure, or tightness. Denies lower extremity edema or PND. No palpitations. Patient was aware when she went into afib and has not had symptoms since discharge from hospital.  She has been walking 2 times a day without difficulty until Monday (3 days ago). She ran errands to a few different stores with her sister then took her normal walk outside when she got home. She stated about halfway through her walk she began to feel short of breath. Since that day she has experienced shortness of breath with outside activities. She is able to perform all of her normal inside household chores and activities without shortness of breath. She states she suffers from seasonal allergies and feels these symptoms are closely related to the increased pollen that started over the weekend. Shortness of breath is somewhat worse than what she has experienced in the past.  She has a Breo inhaler that she feels does not help much.  She thinks she has an albuterol inhaler at home.  She states she did not do much yesterday because she felt a bit worn  out. She sleeps propped up for comfort not secondary to shortness of breath. She continues to smoke 1/4-1/2 pack of cigarettes per day. She is not interested in quitting. She is awaiting scheduling for a sleep study through her PCP.     ROS: All other systems reviewed and are otherwise negative except as noted in History of Present  Illness.  Studies Reviewed    ECG personally reviewed by me today: Sinus rhythm with first-degree AV block, 79 bpm.  No significant changes from 06/17/2022.  Risk Assessment/Calculations     CHA2DS2-VASc Score = 2   This indicates a 2.2% annual risk of stroke. The patient's score is based upon: CHF History: 0 HTN History: 0 Diabetes History: 0 Stroke History: 0 Vascular Disease History: 0 Age Score: 1 Gender Score: 1         Physical Exam    VS:  BP 134/74 (BP Location: Left Arm, Patient Position: Sitting, Cuff Size: Normal)   Pulse 79   Ht 5\' 5"  (1.651 m)   Wt 170 lb (77.1 kg)   BMI 28.29 kg/m  , BMI Body mass index is 28.29 kg/m.  GEN: Well nourished, well developed, in no acute distress. Neck: No JVD or carotid bruits. Cardiac:  RRR. No murmurs. No rubs or gallops.   Respiratory:  Respirations regular and unlabored.  Rhonchi bilateral upper lobes that clears with cough.  Diffuse wheezing throughout.  No rales or rhonchi. GI: Soft, nontender, nondistended. Extremities: Radials/DP/PT 2+ and equal bilaterally. No clubbing or cyanosis. No edema bilaterally. Skin: Warm and dry, no rash. Neuro: Strength intact.  Assessment & Plan   Severe aortic stenosis s/p AVR.  Patient was found to have aortic stenosis in September 2023.  She underwent AVR with Dr. Caffie Pinto on 06/16/2022. Patient has done well overall since discharge from hospital. She has been walking twice a day with good tolerance until three days ago when she experienced dyspnea while walking. She has been able to perform inside activities without shortness of breath. She denies lower extremity edema or PND.  She has not needed to take Lasix.  She reports yesterday she did not do much as secondary to feeling fatigued.  Discussed the amount of activity she is doing in 1 day.  She states that she is stubborn and does not want others to do things for her.  She admits she is likely overdoing it.  Suggested she scale back some  of her activity and slowly progress as tolerated.  Euvolemic on exam. (See #2). She is scheduled for post AVR echo on 07/28/2022. DOE.  Patient reports she was tolerating twice a day walks outside since discharge from hospital without shortness of breath.  3 days ago she ran errands to a few different stores with her sister then took her normal walk outside when she got home.  About halfway through her walk she began to feel short of breath and has experienced shortness of breath with outside activities since then.  She is able to perform all of her normal inside activities without shortness of breath.  She attributes this to allergies as she typically suffers from shortness of breath when the pollen is increased.  She admits this is slightly worse than previous.  Upon examination patient has rhonchi in upper lobes bilaterally that clears with cough.  She has diffuse wheezing throughout.  No rales or crackles.  I suspect her DOE is related to COPD.  I instructed patient to use her albuterol inhaler when she arrives  home.  If she continues to have wheezing and difficulty breathing instructed her to contact PCP. Postoperative A-fib.  Patient developed postoperative A-fib that converted with IV amiodarone.  She was transitioned to oral amiodarone.  Patient denies palpitations or racing heart.  She was aware of arrhythmia in the hospital.  She has not had similar symptoms since.  EKG shows sinus rhythm with first-degree AV block, 79 bpm.  Continue aspirin and amiodarone. Nonobstructive CAD.  LHC February 2024 showed mild nonobstructive plaque in mid LAD otherwise normal coronaries.  Patient denies chest pain, tightness, pressure.  She has been taking twice daily walks without any chest pain.  Continue aspirin. Tobacco abuse. Patient continues to smoke 1/4 to 1/2 pack of cigarettes per day.  She has no interest in quitting at this time.  Disposition: Slowly progress activity.  Use albuterol inhaler.  If continued  wheezing and shortness of breath contact PCP.  Repeat echo scheduled for 07/28/2022.  Return in 3 months or sooner as needed.        Signed, Justice [place]. Syrai Gladwin, DNP, NP-C

## 2022-07-07 NOTE — Telephone Encounter (Signed)
Received incoming voicemail from [facility] requesting clinical notes and copies of insurance cards to be faxed to [phone].Requested information has been faxed to [facility] per request.

## 2022-07-09 ENCOUNTER — Encounter: Payer: Self-pay | Admitting: Student

## 2022-07-09 ENCOUNTER — Ambulatory Visit: Payer: No Typology Code available for payment source | Attending: Student | Admitting: Student

## 2022-07-09 VITALS — BP 134/74 | HR 79 | Ht 65.0 in | Wt 170.0 lb

## 2022-07-09 DIAGNOSIS — I2510 Atherosclerotic heart disease of native coronary artery without angina pectoris: Secondary | ICD-10-CM | POA: Diagnosis not present

## 2022-07-09 DIAGNOSIS — I350 Nonrheumatic aortic (valve) stenosis: Secondary | ICD-10-CM

## 2022-07-09 DIAGNOSIS — Z952 Presence of prosthetic heart valve: Secondary | ICD-10-CM | POA: Diagnosis not present

## 2022-07-09 DIAGNOSIS — I480 Paroxysmal atrial fibrillation: Secondary | ICD-10-CM | POA: Diagnosis not present

## 2022-07-09 DIAGNOSIS — R0609 Other forms of dyspnea: Secondary | ICD-10-CM | POA: Diagnosis not present

## 2022-07-09 DIAGNOSIS — Z720 Tobacco use: Secondary | ICD-10-CM | POA: Diagnosis not present

## 2022-07-09 NOTE — Patient Instructions (Signed)
Medication Instructions:  Your physician recommends that you continue on your current medications as directed. Please refer to the Current Medication list given to you today.  *If you need a refill on your cardiac medications before your next appointment, please call your pharmacy*   Lab Work: NONE If you have labs (blood work) drawn today and your tests are completely normal, you will receive your results only by: [place] (if you have MyChart) OR A paper copy in the mail If you have any lab test that is abnormal or we need to change your treatment, we will call you to review the results.   Testing/Procedures: NONE   Follow-Up: At [facility], you and your health needs are our priority.  As part of our continuing mission to provide you with exceptional heart care, we have created designated Provider Care Teams.  These Care Teams include your primary Cardiologist (physician) and Advanced Practice Providers (APPs -  Physician Assistants and Nurse Practitioners) who all work together to provide you with the care you need, when you need it.  We recommend signing up for the patient portal called "MyChart".  Sign up information is provided on this After Visit Summary.  MyChart is used to connect with patients for Virtual Visits (Telemedicine).  Patients are able to view lab/test results, encounter notes, upcoming appointments, etc.  Non-urgent messages can be sent to your provider as well.   To learn more about what you can do with MyChart, go to [email or web address].    Your next appointment:   3 month(s)  Provider:   Kirk Ruths, MD

## 2022-07-10 ENCOUNTER — Ambulatory Visit: Payer: No Typology Code available for payment source | Admitting: Podiatry

## 2022-07-12 ENCOUNTER — Other Ambulatory Visit: Payer: Self-pay | Admitting: Physician Assistant

## 2022-07-13 ENCOUNTER — Telehealth (HOSPITAL_COMMUNITY): Payer: Self-pay

## 2022-07-13 NOTE — Telephone Encounter (Signed)
Per phase I cardiac rehab, fax referral to [facility]

## 2022-07-14 ENCOUNTER — Other Ambulatory Visit: Payer: Self-pay | Admitting: Surgery

## 2022-07-14 DIAGNOSIS — Z952 Presence of prosthetic heart valve: Secondary | ICD-10-CM

## 2022-07-15 ENCOUNTER — Ambulatory Visit (INDEPENDENT_AMBULATORY_CARE_PROVIDER_SITE_OTHER): Payer: Self-pay | Admitting: Surgery

## 2022-07-15 ENCOUNTER — Ambulatory Visit
Admission: RE | Admit: 2022-07-15 | Discharge: 2022-07-15 | Disposition: A | Discharge status: Home / Self Care | Payer: No Typology Code available for payment source | Source: Ambulatory Visit | Attending: Surgery | Admitting: Surgery

## 2022-07-15 ENCOUNTER — Encounter: Payer: Self-pay | Admitting: Surgery

## 2022-07-15 VITALS — BP 115/73 | HR 78 | Resp 18 | Ht 65.0 in | Wt 169.0 lb

## 2022-07-15 DIAGNOSIS — Z952 Presence of prosthetic heart valve: Secondary | ICD-10-CM

## 2022-07-15 DIAGNOSIS — J948 Other specified pleural conditions: Secondary | ICD-10-CM | POA: Diagnosis not present

## 2022-07-15 DIAGNOSIS — J449 Chronic obstructive pulmonary disease, unspecified: Secondary | ICD-10-CM | POA: Diagnosis not present

## 2022-07-15 NOTE — Progress Notes (Signed)
HPI: Patient returns for routine postoperative follow-up having undergone aortic valve replacement with a 21 mm INSPIRIS valve pericardial on 06/16/2022. The patient's early postoperative recovery while in the hospital was notable for postoperative atrial fibrillation converted with amiodarone.  She also had postoperative vasoplegia requiring use of midodrine to wean Neo-Synephrine.  She was sent home on midodrine 5 mg 3 times daily. Since hospital discharge the patient reports that she has been feeling well.  She is walking daily without chest pain.  She has some shortness of breath with outside activities that she feels may be due to pollen.  She continues to smoke about half pack of cigarettes per day.  She has had no peripheral edema.  She has not noticed any tachycardia or irregular heart rhythm.   Current Outpatient Medications  Medication Sig Dispense Refill   albuterol (VENTOLIN HFA) 108 (90 Base) MCG/ACT inhaler Inhale 2 puffs into the lungs every 6 (six) hours as needed for wheezing or shortness of breath.     AMBULATORY NON FORMULARY MEDICATION Home health:  Physical therapy-Evaluate and treat, Occupational therapy-Evaluate and treat, Nursing-Teaching on heart healthy diet, medications, 1 Units 0   amiodarone (PACERONE) 200 MG tablet Take 1 tablet (200 mg total) by mouth 2 (two) times daily. 60 tablet 1   ascorbic acid (VITAMIN C) 500 MG tablet Take 500 mg by mouth daily.     aspirin 81 MG EC tablet TAKE 1 TABLET BY MOUTH EVERY DAY (Patient taking differently: Take 81 mg by mouth daily.) 90 tablet 3   atorvastatin (LIPITOR) 10 MG tablet TAKE 1 TABLET BY MOUTH EVERY DAY 90 tablet 3   baclofen (LIORESAL) 10 MG tablet TAKE 1 TABLET BY MOUTH THREE TIMES A DAY (Patient taking differently: Take 10 mg by mouth 3 (three) times daily as needed for muscle spasms.) 270 tablet 1   BREO ELLIPTA 100-25 MCG/ACT AEPB TAKE 1 PUFF BY MOUTH EVERY DAY 60 each 11   Calcium Carb-Cholecalciferol (CALCIUM  600 + D PO) Take 1 tablet by mouth 2 (two) times daily.     Cholecalciferol (DIALYVITE VITAMIN D 5000) 125 MCG (5000 UT) capsule Take 5,000 Units by mouth daily.     clonazePAM (KLONOPIN) 0.5 MG tablet TAKE 1 TABLET BY MOUTH EVERYDAY AT BEDTIME 30 tablet 0   fluticasone (FLONASE) 50 MCG/ACT nasal spray INSTILL 1 SPRAY INTO BOTH NOSTRILS DAILY (Patient taking differently: Place 1 spray into both nostrils daily.) 48 mL 1   Garlic 1000 MG CAPS Take 1,000 mg by mouth daily.     levothyroxine (SYNTHROID) 100 MCG tablet TAKE 1 TABLET BY MOUTH DAILY BEFORE BREAKFAST. 90 tablet 3   magnesium hydroxide (MILK OF MAGNESIA) 400 MG/5ML suspension Take 15 mLs by mouth daily as needed for mild constipation.     midodrine (PROAMATINE) 5 MG tablet Take 1 tablet (5 mg total) by mouth 3 (three) times daily with meals. For 7 days then decrease the dose to 1 tablet (5mg ) twice daily 60 tablet 1   Multiple Vitamins-Minerals (CENTRUM SILVER ADULT 50+ PO) Take 1 tablet by mouth daily.     Omega-3 Fatty Acids (FISH OIL) 1200 MG CAPS Take 1,200 mg by mouth daily.     pregabalin (LYRICA) 75 MG capsule Take 1 capsule (75 mg total) by mouth at bedtime as needed. (Patient taking differently: Take 75 mg by mouth at bedtime.) 30 capsule 5   rOPINIRole (REQUIP) 3 MG tablet TAKE 1 TABLET BY MOUTH AT BEDTIME. 90 tablet 0  zinc gluconate 50 MG tablet Take 50 mg by mouth daily.     No current facility-administered medications for this visit.    Physical Exam: BP 115/73 (BP Location: Left Arm, Patient Position: Sitting)   Pulse 78   Resp 18   Ht 5\' 5"  (1.651 m)   Wt 169 lb (76.7 kg)   SpO2 96% Comment: RA  BMI 28.12 kg/m  She looks well. Cardiac exam shows a regular rate and rhythm with normal heart sounds.  There is a 1/6 systolic flow murmur along the right sternal border.  There is no diastolic murmur. Lungs are clear. The chest incision is healing well and the sternum is stable. There is no peripheral  edema.  Diagnostic Tests:  Narrative & Impression  CLINICAL DATA:  Post aortic valve replacement.  History of COPD.   EXAM: CHEST - 2 VIEW   COMPARISON:  06/22/2022, 06/01/2022 and CT 06/03/2022   FINDINGS: Sternotomy wires unchanged. Prosthetic aortic valve unchanged. Lungs are adequately inflated without pleural effusion. Linear scarring over the left midlung. Focal pleural calcification over the anterior left mid to upper lung. No acute airspace process. Minimal flattening of the hemidiaphragms. Cardiomediastinal silhouette and remainder of the exam is unchanged.   IMPRESSION: 1. No acute findings. 2. Linear scarring as well as focal pleural calcification over the left thorax.     Electronically Signed   By: Elberta Fortis M.D.   On: 07/15/2022 10:43      Impression:  Overall she is doing well 1 month following her surgery.  I encouraged her to continue increasing her activity level.  I advised her against smoking.  I told her she could return to driving a car at this time but should refrain from lifting anything heavier than 10 pounds for 3 months postoperatively.  Plan:  She has a postoperative echo scheduled on 07/28/2022 and will continue to follow-up with cardiology after that.  I told her that she can DC the midodrine at this time.  I also asked her to decrease the amiodarone to 200 mg daily until her prescription is complete and then she can stop it.  She will return to see me if she has any problems with her incision.   Alleen Borne, MD [facility] [phone]

## 2022-07-16 ENCOUNTER — Other Ambulatory Visit: Payer: Self-pay | Admitting: Physician Assistant

## 2022-07-17 ENCOUNTER — Ambulatory Visit: Payer: Self-pay

## 2022-07-17 NOTE — Patient Outreach (Signed)
  Care Coordination   Initial Visit Note   07/17/2022 Name: Catalina Strahm MRN: [id number] DOB: [birth year]  Aiysha Freiwald is a 72 y.o. year old female who sees Everrett Coombe, DO for primary care. I spoke with  Baldemar Friday by phone today.  What matters to the patients health and wellness today?  Ms. Daniele reports she is doing well. She has been to all her follow up appointments: PCP hospital f/u; AWV; Cardiology. Had follow up with Cardiothoracic surgeon on 07/15/22.  Active smoker- Ms. Suhre states she has cut back on smoking, but does not plan to quite smoking. She denies any concerns or complaints at this time.  Goals Addressed             This Visit's Progress    Care Coordination: Continue to improve post hospitalization       Interventions Today    Flowsheet Row Most Recent Value  Chronic Disease   Chronic disease during today's visit Other  [recent surgery for AVR/ severe aortic stenosis]  General Interventions   General Interventions Discussed/Reviewed General Interventions Discussed, Doctor Visits  Doctor Visits Discussed/Reviewed Doctor Visits Discussed, Annual Wellness Visits, PCP, Doctor Visits Reviewed  PCP/Specialist Visits Compliance with follow-up visit  Exercise Interventions   Exercise Discussed/Reviewed Exercise Discussed  [reviewed activity instructions per Dr. Laneta Simmers, thoracic surgeon with patient. per patient, home health completed an evaluation and patient is not home bound therefore not eligible for home health services-pt denies any in home care needs]  Education Interventions   Education Provided Provided Education  Provided Verbal Education On Other, Exercise, When to see the doctor  Pharmacy Interventions   Pharmacy Dicussed/Reviewed Pharmacy Topics Discussed, Medication Adherence, Medications and their functions, Affording Medications    SDOH assessments and interventions completed:  Yes confirmed no SHOH needs.  SDOH Interventions  Today    Flowsheet Row Most Recent Value  SDOH Interventions   Housing Interventions Intervention Not Indicated     Care Coordination Interventions:  Yes, provided   Follow up plan: Follow up call scheduled for 07/31/22    Encounter Outcome:  Pt. Visit Completed   Kathyrn Sheriff, RN, MSN, BSN, CCM [facility] Care Coordinator [phone]

## 2022-07-17 NOTE — Patient Instructions (Addendum)
Visit Information  Thank you for taking time to visit with me today. Please don't hesitate to contact me if I can be of assistance to you.   Following are the goals we discussed today:   Goals Addressed             This Visit's Progress    Care Coordination: Continue to improve post hospitalization       Interventions Today    Flowsheet Row Most Recent Value  Chronic Disease   Chronic disease during today's visit Other  [post surgery for AVR/ severe aortic stenosis]  General Interventions   General Interventions Discussed/Reviewed General Interventions Discussed, Doctor Visits  Doctor Visits Discussed/Reviewed Doctor Visits Discussed, Annual Wellness Visits, PCP, Doctor Visits Reviewed  PCP/Specialist Visits Compliance with follow-up visit  Exercise Interventions   Exercise Discussed/Reviewed Exercise Discussed  [reviewed activity instructions per Dr. Laneta Simmers, thoracic surgeon with patient. per patient, home health completed an evaluation and patient is not home bound therefore not eligible for home health services-pt denies any in home care needs]  Education Interventions   Education Provided Provided Education  Provided Verbal Education On Other, Exercise, When to see the doctor  Pharmacy Interventions   Pharmacy Dicussed/Reviewed Pharmacy Topics Discussed, Medication Adherence, Medications and their functions, Affording Medications            Our next appointment is by telephone on 07/31/22 at 10:30 am  Please call the care guide team at [phone] if you need to cancel or reschedule your appointment.   If you are experiencing a Mental Health or Behavioral Health Crisis or need someone to talk to, please call the Suicide and Crisis Lifeline: 20  Kathyrn Sheriff, RN, MSN, BSN, CCM [facility] Care Coordinator [phone]

## 2022-07-20 ENCOUNTER — Encounter: Payer: Self-pay | Admitting: *Deleted

## 2022-07-25 ENCOUNTER — Other Ambulatory Visit: Payer: Self-pay | Admitting: Family Medicine

## 2022-07-25 DIAGNOSIS — J309 Allergic rhinitis, unspecified: Secondary | ICD-10-CM

## 2022-07-25 DIAGNOSIS — G2581 Restless legs syndrome: Secondary | ICD-10-CM

## 2022-07-25 DIAGNOSIS — Z87410 Personal history of cervical dysplasia: Secondary | ICD-10-CM

## 2022-07-25 DIAGNOSIS — M5412 Radiculopathy, cervical region: Secondary | ICD-10-CM

## 2022-07-27 DIAGNOSIS — L720 Epidermal cyst: Secondary | ICD-10-CM | POA: Diagnosis not present

## 2022-07-27 DIAGNOSIS — L821 Other seborrheic keratosis: Secondary | ICD-10-CM | POA: Diagnosis not present

## 2022-07-27 DIAGNOSIS — D225 Melanocytic nevi of trunk: Secondary | ICD-10-CM | POA: Diagnosis not present

## 2022-07-27 DIAGNOSIS — L814 Other melanin hyperpigmentation: Secondary | ICD-10-CM | POA: Diagnosis not present

## 2022-07-28 ENCOUNTER — Ambulatory Visit (HOSPITAL_BASED_OUTPATIENT_CLINIC_OR_DEPARTMENT_OTHER)
Admission: RE | Admit: 2022-07-28 | Discharge: 2022-07-28 | Disposition: A | Discharge status: Home / Self Care | Payer: No Typology Code available for payment source | Source: Ambulatory Visit | Attending: Cardiology | Admitting: Cardiology

## 2022-07-28 ENCOUNTER — Ambulatory Visit
Admission: EM | Admit: 2022-07-28 | Discharge: 2022-07-28 | Disposition: A | Discharge status: Home / Self Care | Payer: No Typology Code available for payment source | Attending: Family Medicine | Admitting: Family Medicine

## 2022-07-28 ENCOUNTER — Encounter: Payer: Self-pay | Admitting: Emergency Medicine

## 2022-07-28 ENCOUNTER — Ambulatory Visit (INDEPENDENT_AMBULATORY_CARE_PROVIDER_SITE_OTHER): Payer: No Typology Code available for payment source

## 2022-07-28 ENCOUNTER — Ambulatory Visit (HOSPITAL_BASED_OUTPATIENT_CLINIC_OR_DEPARTMENT_OTHER): Payer: No Typology Code available for payment source | Attending: Family Medicine | Admitting: Internal Medicine

## 2022-07-28 DIAGNOSIS — J449 Chronic obstructive pulmonary disease, unspecified: Secondary | ICD-10-CM | POA: Insufficient documentation

## 2022-07-28 DIAGNOSIS — J189 Pneumonia, unspecified organism: Secondary | ICD-10-CM | POA: Insufficient documentation

## 2022-07-28 DIAGNOSIS — E785 Hyperlipidemia, unspecified: Secondary | ICD-10-CM | POA: Insufficient documentation

## 2022-07-28 DIAGNOSIS — Z952 Presence of prosthetic heart valve: Secondary | ICD-10-CM | POA: Insufficient documentation

## 2022-07-28 DIAGNOSIS — I350 Nonrheumatic aortic (valve) stenosis: Secondary | ICD-10-CM | POA: Insufficient documentation

## 2022-07-28 DIAGNOSIS — R059 Cough, unspecified: Secondary | ICD-10-CM | POA: Diagnosis not present

## 2022-07-28 DIAGNOSIS — R0602 Shortness of breath: Secondary | ICD-10-CM | POA: Diagnosis not present

## 2022-07-28 DIAGNOSIS — I700 Atherosclerosis of aorta: Secondary | ICD-10-CM | POA: Diagnosis not present

## 2022-07-28 DIAGNOSIS — F17200 Nicotine dependence, unspecified, uncomplicated: Secondary | ICD-10-CM | POA: Insufficient documentation

## 2022-07-28 DIAGNOSIS — J9811 Atelectasis: Secondary | ICD-10-CM | POA: Diagnosis not present

## 2022-07-28 DIAGNOSIS — G4734 Idiopathic sleep related nonobstructive alveolar hypoventilation: Secondary | ICD-10-CM

## 2022-07-28 DIAGNOSIS — J90 Pleural effusion, not elsewhere classified: Secondary | ICD-10-CM | POA: Diagnosis not present

## 2022-07-28 LAB — ECHOCARDIOGRAM COMPLETE
AR max vel: 1.9 cm2
AV Area VTI: 2.16 cm2
AV Area mean vel: 2.17 cm2
AV Mean grad: 12 mmHg
AV Peak grad: 21.3 mmHg
Ao pk vel: 2.31 m/s
Area-P 1/2: 3.34 cm2
S' Lateral: 2.2 cm

## 2022-07-28 MED ORDER — PREDNISONE 20 MG PO TABS: 40.0000 mg | ORAL_TABLET | Freq: Every day | ORAL | 0 refills | Status: DC

## 2022-07-28 MED ORDER — LEVOFLOXACIN 500 MG PO TABS: 500.0000 mg | ORAL_TABLET | Freq: Every day | ORAL | 0 refills | Status: DC

## 2022-07-28 NOTE — ED Provider Notes (Signed)
[facility]    CSN: [id number] Arrival date & time: 07/28/22  1052      History   Chief Complaint Chief Complaint  Patient presents with   Shortness of Breath    HPI Carolyn [name] is a 72 y.o. female.   HPI  "Carolyn [name]" is a patient known to me from prior visits.  She has COPD.  Has frequent respiratory infections.  Also has known heart disease.  She had a valve replacement performed approximately a month ago.  She states she is here for shortness of breath, chills, concern for pneumonia.  Does have a going on for 3 days.  She had an echocardiogram performed this morning.  Unknown results.  Is not having any chest pain, no pedal edema.  She states she is so short of breath she has not been able to use her Breo inhaler  Past Medical History:  Diagnosis Date   Allergy 2011   Anxiety    Arthritis    BCC (basal cell carcinoma) 08/05/2015   left neck   BCC (basal cell carcinoma) infilt 02/20/2016   right ala nasal   BCC (basal cell carcinoma) sup&nod 06/03/2016   left lower sternum   BCC (basal cell carcinoma) ulcerated 03/19/2016   mid chest between breasts   Cervical cancer    Cervical cancer    COPD (chronic obstructive pulmonary disease)    Depression    Heart murmur    Hyperlipidemia    Hypothyroidism    Melanoma in situ 07/05/2014   right shoulder blade   Neuromuscular disorder    Restless Leg Syndrome   Pneumonia 2019   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   left nare   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   left upper shin   SCC (squamous cell carcinoma) in situ 03/19/2016   right pinky   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   right thigh, left upper shin   SCC (squamous cell carcinoma) well diff 12/13/2008   left hand   SCC (squamous cell carcinoma) well diff x2 12/25/2015   left and right forearm   Thyroid disease     Patient  Active Problem List   Diagnosis Date Noted   S/P AVR 07/01/2022   S/P aortic valve replacement 06/16/2022   Severe aortic stenosis 06/01/2022   Unstable angina 06/01/2022   Plantar fasciitis, right 05/21/2022   Nocturnal oxygen desaturation 05/18/2022   Cervical radiculitis 11/16/2021   Hearing loss due to cerumen impaction, right 06/23/2021   Insomnia 04/08/2021   Tobacco abuse 02/10/2017   Peripheral neuropathic pain 08/04/2016   Pulmonary nodule 07/15/2016   Constipation 04/13/2016   Aortic atherosclerosis 01/13/2016   COPD (chronic obstructive pulmonary disease) 01/13/2016   Depression 07/11/2015   GAD (generalized anxiety disorder) 07/11/2015   Allergic rhinitis 07/11/2015   Hypothyroidism 07/11/2015   Restless leg syndrome 07/11/2015   Atypical glandular cells of undetermined significance (AGUS) on cervical Pap smear 11/06/2013   Postmenopausal state 10/09/2013   History of cervical dysplasia 10/09/2013    Past Surgical History:  Procedure Laterality Date   ANKLE SURGERY Right 2000   repaired tendons with pin placed   AORTIC VALVE REPLACEMENT N/A 06/16/2022   Procedure: AORTIC VALVE REPLACEMENT (AVR) USING EDWARDS INSPIRIS SIZE ;  Surgeon: Alleen Borne, MD;  Location: [facility] OR;  Service: Open Heart Surgery;  Laterality: N/A;  CERVICAL CONIZATION W/BX     COLONOSCOPY  2014   EYE SURGERY Bilateral    Cataracts   MELANOMA EXCISION  08/2014   back   RIGHT HEART CATH AND CORONARY ANGIOGRAPHY N/A 06/02/2022   Procedure: RIGHT HEART CATH AND CORONARY ANGIOGRAPHY;  Surgeon: Tonny Bollman, MD;  Location[facility] CV LAB;  Service: Cardiovascular;  Laterality: N/A;   SHOULDER SURGERY Right    x2   SHOULDER SURGERY Left    TEE WITHOUT CARDIOVERSION N/A 06/16/2022   Procedure: TRANSESOPHAGEAL ECHOCARDIOGRAM;  Surgeon: Alleen Borne, MD;  Location: [facility] OR;  Service: Open Heart Surgery;  Laterality: N/A;   THUMB ARTHROSCOPY Right    Removed bone in thumb    OB  History     Gravida  3   Para  2   Term  2   Preterm  0   AB  1   Living  2      SAB  1   IAB      Ectopic      Multiple      Live Births  2            Home Medications    Prior to Admission medications   Medication Sig Start Date End Date Taking? Authorizing Provider  levofloxacin (LEVAQUIN) 500 MG tablet Take 1 tablet (500 mg total) by mouth daily. 07/28/22  Yes Eustace Moore, MD  predniSONE (DELTASONE) 20 MG tablet Take 2 tablets (40 mg total) by mouth daily with breakfast. 07/28/22  Yes Eustace Moore, MD  albuterol (VENTOLIN HFA) 108 (90 Base) MCG/ACT inhaler Inhale 2 puffs into the lungs every 6 (six) hours as needed for wheezing or shortness of breath.    [provider]  AMBULATORY NON FORMULARY MEDICATION Home health:  Physical therapy-Evaluate and treat, Occupational therapy-Evaluate and treat, Nursing-Teaching on heart healthy diet, medications, 07/01/22   Everrett Coombe, DO  amiodarone (PACERONE) 200 MG tablet Take 1 tablet (200 mg total) by mouth 2 (two) times daily. 06/24/22   Leary Roca, PA-C  ascorbic acid (VITAMIN C) 500 MG tablet Take 500 mg by mouth daily.    [provider]  aspirin 81 MG EC tablet TAKE 1 TABLET BY MOUTH EVERY DAY Patient taking differently: Take 81 mg by mouth daily. 11/11/18   Remus Loffler, PA-C  atorvastatin (LIPITOR) 10 MG tablet TAKE 1 TABLET BY MOUTH EVERY DAY 02/24/22   Everrett Coombe, DO  baclofen (LIORESAL) 10 MG tablet Take 1 tablet (10 mg total) by mouth 3 (three) times daily as needed for muscle spasms. 07/27/22   Everrett Coombe, DO  BREO ELLIPTA 100-25 MCG/ACT AEPB TAKE 1 PUFF BY MOUTH EVERY DAY 02/24/22   Everrett Coombe, DO  Calcium Carb-Cholecalciferol (CALCIUM 600 + D PO) Take 1 tablet by mouth 2 (two) times daily.    [provider]  Cholecalciferol (DIALYVITE VITAMIN D 5000) 125 MCG (5000 UT) capsule Take 5,000 Units by mouth daily.    [provider]  clonazePAM  (KLONOPIN) 0.5 MG tablet TAKE 1 TABLET BY MOUTH EVERYDAY AT BEDTIME 07/27/22   Everrett Coombe, DO  fluticasone [facility]) 50 MCG/ACT nasal spray SPRAY 1 SPRAY INTO EACH NOSTRIL DAILY 07/27/22   Everrett Coombe, DO  Garlic 1000 MG CAPS Take 1,000 mg by mouth daily.    [provider]  levothyroxine (SYNTHROID) 100 MCG tablet TAKE 1 TABLET BY MOUTH DAILY BEFORE BREAKFAST. 10/27/21   Everrett Coombe, DO  magnesium hydroxide (MILK OF MAGNESIA)  400 MG/5ML suspension Take 15 mLs by mouth daily as needed for mild constipation.    [provider]  Multiple Vitamins-Minerals (CENTRUM SILVER ADULT 50+ PO) Take 1 tablet by mouth daily.    [provider]  Omega-3 Fatty Acids (FISH OIL) 1200 MG CAPS Take 1,200 mg by mouth daily.    [provider]  pregabalin (LYRICA) 75 MG capsule Take 1 capsule (75 mg total) by mouth at bedtime as needed. Patient taking differently: Take 75 mg by mouth at bedtime. 03/25/22   Ocie Doyne, MD  rOPINIRole (REQUIP) 3 MG tablet TAKE 1 TABLET BY MOUTH EVERYDAY AT BEDTIME 07/27/22   Everrett Coombe, DO  zinc gluconate 50 MG tablet Take 50 mg by mouth daily.    [provider]    Family History Family History  Problem Relation Age of Onset   Heart disease Mother    Aneurysm Mother    Heart disease Father    Kidney failure Father    Cancer Sister    COPD Sister    Cancer Brother    Cirrhosis Daughter    Cancer Sister        Bronchial    Social History Social History   Tobacco Use   Smoking status: Every Day    Packs/day: 0.50    Years: 54.00    Additional pack years: 0.00    Total pack years: 27.00    Types: Cigarettes    Start date: 04/06/1968    Passive exposure: Never   Smokeless tobacco: Never  Vaping Use   Vaping Use: Never used  Substance Use Topics   Alcohol use: No   Drug use: No     Allergies   Trazodone and nefazodone and Azithromycin   Review of Systems Review of Systems See HPI  Physical  Exam Triage Vital Signs ED Triage Vitals  Enc Vitals Group     BP 07/28/22 1117 121/78     Pulse Rate 07/28/22 1117 90     Resp 07/28/22 1117 (!) 24     Temp 07/28/22 1117 98.3 F (36.8 C)     Temp Source 07/28/22 1117 Oral     SpO2 07/28/22 1117 94 %     Weight 07/28/22 1120 168 lb (76.2 kg)     Height 07/28/22 1120  (1.651 m)     Head Circumference --      Peak Flow --      Pain Score 07/28/22 1119 3     Pain Loc --      Pain Edu? --      Excl. in GC? --    No data found.  Updated Vital Signs BP 121/78 (BP Location: Left Arm)   Pulse 90   Temp 98.3 F (36.8 C) (Oral)   Resp (!) 24   Ht  (1.651 m)   Wt 76.2 kg   SpO2 94%   BMI 27.96 kg/m      Physical Exam Constitutional:      General: She is not in acute distress.    Appearance: She is well-developed. She is ill-appearing.  HENT:     Head: Normocephalic and atraumatic.  Eyes:     Conjunctiva/sclera: Conjunctivae normal.     Pupils: Pupils are equal, round, and reactive to light.  Cardiovascular:     Rate and Rhythm: Normal rate.  Pulmonary:     Effort: Pulmonary effort is normal. No respiratory distress.     Breath sounds: Examination of the right-lower  field reveals rales. Examination of the left-lower field reveals rales. Rales present. No wheezing or rhonchi.  Chest:     Chest wall: No tenderness.  Abdominal:     General: There is no distension.     Palpations: Abdomen is soft.  Musculoskeletal:        General: Normal range of motion.     Cervical back: Normal range of motion.  Skin:    General: Skin is warm and dry.  Neurological:     Mental Status: She is alert.  Psychiatric:        Mood and Affect: Mood normal.      UC Treatments / Results  Labs (all labs ordered are listed, but only abnormal results are displayed) Labs Reviewed - No data to display  EKG   Radiology DG Chest 2 View  Result Date: 07/28/2022 CLINICAL DATA:  Cough, shortness of breath. EXAM: CHEST - 2 VIEW  COMPARISON:  July 15, 2022. FINDINGS: Stable cardiomediastinal silhouette. Status post aortic valve repair. Stable left lingular scarring or atelectasis is noted. Increased left basilar subsegmental atelectasis is noted with minimal left pleural effusion. Right lung is unremarkable. Mild dextroscoliosis is noted. IMPRESSION: Stable left lingular scarring or atelectasis is noted. Increased left basilar subsegmental atelectasis is noted with minimal left pleural effusion. Electronically Signed   By: Lupita Raider M.D.   On: 07/28/2022 11:53    Procedures Procedures (including critical care time)  Medications Ordered in UC Medications - No data to display  Initial Impression / Assessment and Plan / UC Course  I have reviewed the triage vital signs and the nursing notes.  Pertinent labs & imaging results that were available during my care of the patient were reviewed by me and considered in my medical decision making (see chart for details).    Clinically, I feel the patient has pneumonia and I am going to treat her accordingly Final Clinical Impressions(s) / UC Diagnoses   Final diagnoses:  Community acquired pneumonia of left lower lobe of lung     Discharge Instructions      Make sure that you are drinking lots of liquids Take Mucinex 2 times a day to help loosen the phlegm Take Levaquin once a day for a week.  This is a strong antibiotic. Take prednisone once a day for 5 days.  This should help with your breathing Continue your usual inhalers Call and make a follow-up appointment with Dr. Ashley Royalty for next week     ED Prescriptions     Medication Sig Dispense Auth. Provider   levofloxacin (LEVAQUIN) 500 MG tablet Take 1 tablet (500 mg total) by mouth daily. 7 tablet Eustace Moore, MD   predniSONE (DELTASONE) 20 MG tablet Take 2 tablets (40 mg total) by mouth daily with breakfast. 10 tablet Eustace Moore, MD      PDMP not reviewed this encounter.   Eustace Moore, MD 07/28/22 [phone]

## 2022-07-28 NOTE — ED Triage Notes (Signed)
Short of breath w chills since Saturday  Pt denies temp but has been taking tylenol BID  Pt was supposed to have sleep study - canceled due to being SOB  Pt is concerned about fluid on her lungs or pneumonia Echo done this am

## 2022-07-28 NOTE — Discharge Instructions (Signed)
Make sure that you are drinking lots of liquids Take Mucinex 2 times a day to help loosen the phlegm Take Levaquin once a day for a week.  This is a strong antibiotic. Take prednisone once a day for 5 days.  This should help with your breathing Continue your usual inhalers Call and make a follow-up appointment with Dr. Ashley Royalty for next week

## 2022-07-31 ENCOUNTER — Ambulatory Visit: Payer: Self-pay

## 2022-07-31 NOTE — Patient Outreach (Signed)
  Care Coordination   Follow Up Visit Note   07/31/2022 Name: Carolyn [name] MRN: [id number] DOB: 01-24-1951  Carolyn [name] is a 72 y.o. year old female who sees Carolyn Coombe, DO for primary care. I spoke with  Carolyn [name] by phone today.  What matters to the patients health and wellness today?  Patient presented to ED on 07/28/22 for SOB-being treated for PNA. She states she has antibiotics and prednisone prescribed and feels a little better. She reports she is using a nebulizer machine that belonged to her mother and albuterol neb medication she has from her sister. She states it is hard to take in breath to get inhaler medication in. She reports she was out doing yard work last week and was exposed to pollen and did not wear a mask. Carolyn [name] reports she is followed closely by her providers and will follow up with provider as needed.   Goals Addressed             This Visit's Progress    COMPLETED: Care Coordination: Continue to improve post hospitalization       Interventions Today    Flowsheet Row Most Recent Value  Chronic Disease   Chronic disease during today's visit Chronic Obstructive Pulmonary Disease (COPD)  General Interventions   General Interventions Discussed/Reviewed General Interventions Reviewed, Communication with  Carolyn [name] encouraged patient to contact RNCM if care coordination needs in the future.]  Doctor Visits Discussed/Reviewed Doctor Visits Discussed  PCP/Specialist Visits Compliance with follow-up visit  Communication with PCP/Specialists  [facility] send message to PCP to update on patient condition nebulizer and inhaler/neb medication]  Exercise Interventions   Exercise Discussed/Reviewed --  [encouraged to remain as active as tolerated]  Education Interventions   Education Provided Provided Education  [advised patient to take medications with her to upcoming appointment, advised patient to discuss health concerns with provider either on MyChart or  at next appointment. dis]  Provided Verbal Education On When to see the doctor  [encouraged to attend provider visit as scheduled, advised patient to contact provider or go to urgent care or ED if condition worsens]  Pharmacy Interventions   Pharmacy Dicussed/Reviewed Pharmacy Topics Reviewed            SDOH assessments and interventions completed:  Yes  Care Coordination Interventions:  Yes, provided   Follow up plan: No further intervention required.   Encounter Outcome:  Pt. Visit Completed   Carolyn Sheriff, RN, MSN, BSN, CCM [facility] Care Coordinator [phone]

## 2022-07-31 NOTE — Patient Instructions (Signed)
Visit Information  Thank you for taking time to visit with me today. Please don't hesitate to contact me if I can be of assistance to you.   Following are the goals we discussed today:  Take Medications as prescribed Continue to attend provider visits as scheduled and as needed Discuss health questions with provider at next visit Contact Provider or go to Urgent Care/ED if condition worsens Contact RNCM if care coordination needs in the future.  If you are experiencing a Mental Health or Behavioral Health Crisis or need someone to talk to, please call the Suicide and Crisis Lifeline: 88  Kathyrn Sheriff, RN, MSN, BSN, CCM [facility] Care Coordinator [phone]

## 2022-08-03 ENCOUNTER — Telehealth: Payer: Self-pay | Admitting: Family Medicine

## 2022-08-03 DIAGNOSIS — G4734 Idiopathic sleep related nonobstructive alveolar hypoventilation: Secondary | ICD-10-CM

## 2022-08-03 NOTE — Telephone Encounter (Signed)
Pt called requesting a referral for in clinic study for sleep disorder. Previous test was not completed because Pt had pneumonia.

## 2022-08-06 ENCOUNTER — Ambulatory Visit: Payer: No Typology Code available for payment source | Admitting: Family Medicine

## 2022-08-06 NOTE — Telephone Encounter (Signed)
Can you place an order for PSG or Split night sleep study? Please advise.

## 2022-08-07 NOTE — Telephone Encounter (Signed)
Orders signed.

## 2022-08-11 NOTE — Telephone Encounter (Signed)
Orders for PSG faxed to [facility] at [phone]. Per terry, scheduling team will contact patients insurance for PA and will reach out to patient to schedule sleep study.

## 2022-08-11 NOTE — Telephone Encounter (Signed)
Patient contacted and updated regarding sleep study orders. Patient aware order have been written and Sleep Lab Scheduling Coordinators are working on PA prior to contacting her to schedule sleep lab appointment.

## 2022-08-29 ENCOUNTER — Other Ambulatory Visit: Payer: Self-pay | Admitting: Family Medicine

## 2022-08-29 DIAGNOSIS — M5412 Radiculopathy, cervical region: Secondary | ICD-10-CM

## 2022-08-29 DIAGNOSIS — Z87410 Personal history of cervical dysplasia: Secondary | ICD-10-CM

## 2022-09-07 ENCOUNTER — Other Ambulatory Visit: Payer: Self-pay | Admitting: Family Medicine

## 2022-09-07 ENCOUNTER — Encounter: Payer: Self-pay | Admitting: Family Medicine

## 2022-09-07 ENCOUNTER — Ambulatory Visit (INDEPENDENT_AMBULATORY_CARE_PROVIDER_SITE_OTHER): Payer: No Typology Code available for payment source | Admitting: Family Medicine

## 2022-09-07 ENCOUNTER — Ambulatory Visit (INDEPENDENT_AMBULATORY_CARE_PROVIDER_SITE_OTHER): Payer: No Typology Code available for payment source

## 2022-09-07 VITALS — BP 126/78 | HR 75 | Ht 65.0 in | Wt 171.0 lb

## 2022-09-07 DIAGNOSIS — J9811 Atelectasis: Secondary | ICD-10-CM | POA: Diagnosis not present

## 2022-09-07 DIAGNOSIS — Z1231 Encounter for screening mammogram for malignant neoplasm of breast: Secondary | ICD-10-CM

## 2022-09-07 DIAGNOSIS — R059 Cough, unspecified: Secondary | ICD-10-CM | POA: Diagnosis not present

## 2022-09-07 DIAGNOSIS — J441 Chronic obstructive pulmonary disease with (acute) exacerbation: Secondary | ICD-10-CM | POA: Diagnosis not present

## 2022-09-07 DIAGNOSIS — R051 Acute cough: Secondary | ICD-10-CM

## 2022-09-07 DIAGNOSIS — R062 Wheezing: Secondary | ICD-10-CM | POA: Diagnosis not present

## 2022-09-07 MED ORDER — ALBUTEROL SULFATE HFA 108 (90 BASE) MCG/ACT IN AERS: 2.0000 | INHALATION_SPRAY | Freq: Four times a day (QID) | RESPIRATORY_TRACT | 0 refills | Status: DC | PRN

## 2022-09-07 MED ORDER — DOXYCYCLINE HYCLATE 100 MG PO TABS: 100.0000 mg | ORAL_TABLET | Freq: Two times a day (BID) | ORAL | 0 refills | Status: DC

## 2022-09-07 MED ORDER — PREDNISONE 50 MG PO TABS: ORAL_TABLET | ORAL | 0 refills | Status: DC

## 2022-09-07 NOTE — Progress Notes (Signed)
Carolyn [name] - 72 y.o. female MRN [id number]  Date of birth: 21-Jun-1950  Subjective Chief Complaint  Patient presents with   Wheezing    HPI Carolyn [name] is a 72 y.o. female here today with complaint of chest and sinus congestion, dyspnea, wheezing.  Started about 2 weeks ago with worsening over the past few days.  She denies fever, chills, nausea or vomiting, headache or body aches.  History of COPD and continues to smoke.   ROS:  A comprehensive ROS was completed and negative except as noted per HPI  Allergies  Allergen Reactions   Trazodone And Nefazodone Shortness Of Breath   Azithromycin Rash    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   right shoulder blade   Neuromuscular disorder (HCC)    Restless Leg Syndrome   Pneumonia 2019   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   left nare   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   left upper shin   SCC (squamous cell carcinoma) in situ 03/19/2016   right pinky   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   right thigh, left upper shin   SCC (squamous cell carcinoma) well diff 12/13/2008   left hand   SCC (squamous cell carcinoma) well diff x2 12/25/2015   left and right forearm   Thyroid disease     Past Surgical History:  Procedure Laterality Date   ANKLE SURGERY Right 2000   repaired tendons with pin  placed   AORTIC VALVE REPLACEMENT N/A 06/16/2022   Procedure: AORTIC VALVE REPLACEMENT (AVR) USING EDWARDS INSPIRIS SIZE ;  Surgeon: Alleen Borne, MD;  Location: [facility] OR;  Service: Open Heart Surgery;  Laterality: N/A;   CERVICAL CONIZATION W/BX     COLONOSCOPY  2014   EYE SURGERY Bilateral    Cataracts   MELANOMA EXCISION  08/2014   back   RIGHT HEART CATH AND CORONARY ANGIOGRAPHY N/A 06/02/2022   Procedure: RIGHT HEART CATH AND CORONARY ANGIOGRAPHY;  Surgeon: Tonny Bollman, MD;  Location: [facility] INVASIVE CV LAB;  Service: Cardiovascular;  Laterality: N/A;   SHOULDER SURGERY Right    x2   SHOULDER SURGERY Left    TEE WITHOUT CARDIOVERSION N/A 06/16/2022   Procedure: TRANSESOPHAGEAL ECHOCARDIOGRAM;  Surgeon: Alleen Borne, MD;  Location: [facility] OR;  Service: Open Heart Surgery;  Laterality: N/A;   THUMB ARTHROSCOPY Right    Removed bone in thumb    Social History   Socioeconomic History   Marital status: Divorced    Spouse name: Not on file   Number of children: 2   Years of education: GED   Highest education level: GED or equivalent  Occupational History   Occupation: disabled  Tobacco Use   Smoking status: Every Day  Packs/day: 0.50    Years: 54.00    Additional pack years: 0.00    Total pack years: 27.00    Types: Cigarettes    Start date: 04/06/1968    Passive exposure: Never   Smokeless tobacco: Never  Vaping Use   Vaping Use: Never used  Substance and Sexual Activity   Alcohol use: No   Drug use: No   Sexual activity: Not Currently    Partners: Male    Birth control/protection: None  Other Topics Concern   Not on file  Social History Narrative   Lives alone. She had two children, one is deceased. Her son lives in [place]. She has a sister who lives close by who helps her a lot. Her enjoys gardening.    Social Determinants of Health   Financial Resource Strain: Low Risk  (07/02/2022)   Overall Financial Resource Strain (CARDIA)    Difficulty of Paying  Living Expenses: Not hard at all  Food Insecurity: No Food Insecurity (07/02/2022)   Hunger Vital Sign    Worried About Running Out of Food in the Last Year: Never true    Ran Out of Food in the Last Year: Never true  Transportation Needs: No Transportation Needs (07/02/2022)   PRAPARE - Administrator, Civil Service (Medical): No    Lack of Transportation (Non-Medical): No  Physical Activity: Inactive (07/02/2022)   Exercise Vital Sign    Days of Exercise per Week: 3 days    Minutes of Exercise per Session: 0 min  Stress: No Stress Concern Present (07/02/2022)   [employer] of Occupational Health - Occupational Stress Questionnaire    Feeling of Stress : Not at all  Social Connections: Moderately Integrated (07/02/2022)   Social Connection and Isolation Panel [NHANES]    Frequency of Communication with Friends and Family: More than three times a week    Frequency of Social Gatherings with Friends and Family: Once a week    Attends Religious Services: 1 to 4 times per year    Active Member of [employer] or Organizations: Yes    Attends Banker Meetings: Never    Marital Status: Divorced    Family History  Problem Relation Age of Onset   Heart disease Mother    Aneurysm Mother    Heart disease Father    Kidney failure Father    Cancer Sister    COPD Sister    Cancer Brother    Cirrhosis Daughter    Cancer Sister        Bronchial    Health Maintenance  Topic Date Due   COVID-19 Vaccine (6 - 2023-24 season) 01/01/2023 (Originally 12/05/2021)   INFLUENZA VACCINE  11/05/2022   Lung Cancer Screening  06/04/2023   Medicare Annual Wellness (AWV)  07/02/2023   MAMMOGRAM  01/23/2024   Colonoscopy  11/15/2028   DTaP/Tdap/Td (3 - Td or Tdap) 01/04/2031   Pneumonia Vaccine 67+ Years old  Completed   DEXA SCAN  Completed   Hepatitis C Screening  Completed   Zoster Vaccines- Shingrix  Completed   HPV VACCINES  Aged Out      ----------------------------------------------------------------------------------------------------------------------------------------------------------------------------------------------------------------- Physical Exam BP 126/78 (BP Location: Left Arm, Patient Position: Sitting, Cuff Size: Large)   Pulse 75   Ht 5\' 5"  (1.651 m)   Wt 171 lb (77.6 kg)   SpO2 96%   BMI 28.46 kg/m   Physical Exam Constitutional:      Appearance: Normal appearance.  HENT:  Head: Normocephalic and atraumatic.  Eyes:     General: No scleral icterus. Cardiovascular:     Rate and Rhythm: Normal rate and regular rhythm.  Pulmonary:     Effort: Pulmonary effort is normal.     Breath sounds: Wheezing (bilateral.) present.  Musculoskeletal:     Cervical back: Neck supple.  Neurological:     Mental Status: She is alert.  Psychiatric:        Mood and Affect: Mood normal.        Behavior: Behavior normal.     ------------------------------------------------------------------------------------------------------------------------------------------------------------------------------------------------------------------- Assessment and Plan  COPD exacerbation (HCC) Starting doxycycline with burst of prednisone.  Albuterol renewed.  CXR ordered.    Meds ordered this encounter  Medications   predniSONE (DELTASONE) 50 MG tablet    Sig: Take 50mg  daily x5 days.    Dispense:  5 tablet    Refill:  0   doxycycline (VIBRA-TABS) 100 MG tablet    Sig: Take 1 tablet (100 mg total) by mouth 2 (two) times daily.    Dispense:  20 tablet    Refill:  0   albuterol (VENTOLIN HFA) 108 (90 Base) MCG/ACT inhaler    Sig: Inhale 2 puffs into the lungs every 6 (six) hours as needed for wheezing or shortness of breath.    Dispense:  8 g    Refill:  0    No follow-ups on file.    This visit occurred during the SARS-CoV-2 public health emergency.  Safety protocols were in place, including screening  questions prior to the visit, additional usage of staff PPE, and extensive cleaning of exam room while observing appropriate contact time as indicated for disinfecting solutions.

## 2022-09-07 NOTE — Assessment & Plan Note (Signed)
Starting doxycycline with burst of prednisone.  Albuterol renewed.  CXR ordered.

## 2022-09-07 NOTE — Addendum Note (Signed)
Addended by: Mammie Lorenzo on: 09/07/2022 02:55 PM   Modules accepted: Orders

## 2022-09-29 ENCOUNTER — Other Ambulatory Visit: Payer: Self-pay | Admitting: Family Medicine

## 2022-09-29 ENCOUNTER — Other Ambulatory Visit: Payer: Self-pay | Admitting: Psychiatry

## 2022-09-29 DIAGNOSIS — M5412 Radiculopathy, cervical region: Secondary | ICD-10-CM

## 2022-09-29 DIAGNOSIS — Z87410 Personal history of cervical dysplasia: Secondary | ICD-10-CM

## 2022-09-29 DIAGNOSIS — E039 Hypothyroidism, unspecified: Secondary | ICD-10-CM

## 2022-09-29 DIAGNOSIS — G2581 Restless legs syndrome: Secondary | ICD-10-CM

## 2022-09-30 NOTE — Telephone Encounter (Signed)
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 Delena Bali patient   Last seen on 03/25/22 per note "Continue Lyrica 75 mg QHS, " Follow up scheduled on 10/20/22 my chart visit Last filled on 08/29/22 #30 tablets (30 day supply) Rx pending to be signed

## 2022-10-06 NOTE — Progress Notes (Signed)
HPI: Follow-up aortic stenosis/aortic valve replacement. Chest CT October 2019 showed aortic atherosclerosis without aneurysm formation. Echocardiogram September 2023 showed normal LV function, grade 1 diastolic dysfunction, moderate left atrial enlargement, moderate to severe aortic stenosis with mean gradient 39 mmHg, peak velocity 4.09 m/s, dimensionless index 0.37, aortic valve area 1.17 cm), mild aortic insufficiency, PFO noted. Abdominal ultrasound 1/24 showed no aneurysm. 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  March 2024 patient underwent aortic valve replacement with an Edwards Inspiris 21 mm bovine pericardial tissue valve.  Postoperative course complicated by atrial fibrillation treated with amiodarone.  Follow-up echocardiogram April 2024 showed normal LV function, mild left ventricular hypertrophy, grade 1 diastolic dysfunction, mild biatrial enlargement, status post aortic valve replacement with mean gradient 12 mmHg and no aortic insufficiency.  Since last seen she denies dyspnea, chest pain, palpitations or syncope.  Current Outpatient Medications  Medication Sig Dispense Refill   albuterol (VENTOLIN HFA) 108 (90 Base) MCG/ACT inhaler TAKE 2 PUFFS BY MOUTH EVERY 6 HOURS AS NEEDED FOR WHEEZE OR SHORTNESS OF BREATH 8.5 each 2   AMBULATORY NON FORMULARY MEDICATION Home health:  Physical therapy-Evaluate and treat, Occupational therapy-Evaluate and treat, Nursing-Teaching on heart healthy diet, medications, 1 Units 0   ascorbic acid (VITAMIN C) 500 MG tablet Take 500 mg by mouth daily.     aspirin 81 MG EC tablet TAKE 1 TABLET BY MOUTH EVERY DAY (Patient taking differently: Take 81 mg by mouth daily.) 90 tablet 3   atorvastatin (LIPITOR) 10 MG tablet TAKE 1 TABLET BY MOUTH EVERY DAY 90 tablet 3    baclofen (LIORESAL) 10 MG tablet TAKE 1 TABLET BY MOUTH THREE TIMES A DAY AS NEEDED FOR MUSCLE SPASMS 270 tablet 1   BREO ELLIPTA 100-25 MCG/ACT AEPB TAKE 1 PUFF BY MOUTH EVERY DAY 60 each 11   Calcium Carb-Cholecalciferol (CALCIUM 600 + D PO) Take 1 tablet by mouth 2 (two) times daily.     Cholecalciferol (DIALYVITE VITAMIN D 5000) 125 MCG (5000 UT) capsule Take 5,000 Units by mouth daily.     clonazePAM (KLONOPIN) 0.5 MG tablet TAKE 1 TABLET BY MOUTH EVERYDAY AT BEDTIME 30 tablet 0   doxycycline (VIBRAMYCIN) 100 MG capsule Take 1 capsule (100 mg total) by mouth 2 (two) times daily for 10 days. 20 capsule 0   fluticasone (FLONASE) 50 MCG/ACT nasal spray SPRAY 1 SPRAY INTO EACH NOSTRIL DAILY 48 mL 1   Garlic 1000 MG CAPS Take 1,000 mg by mouth daily.     levofloxacin (LEVAQUIN) 500 MG tablet Take 1 tablet (500 mg total) by mouth daily. 7 tablet 0   levothyroxine (SYNTHROID) 100 MCG tablet TAKE 1 TABLET BY MOUTH EVERY DAY BEFORE BREAKFAST 90 tablet 1   magnesium hydroxide (MILK OF MAGNESIA) 400 MG/5ML suspension Take 15 mLs by mouth daily as needed for mild constipation.     Multiple Vitamins-Minerals (CENTRUM SILVER ADULT 50+ PO) Take 1 tablet by mouth daily.     Omega-3 Fatty Acids (FISH OIL) 1200 MG CAPS Take 1,200 mg by mouth daily.     predniSONE (STERAPRED UNI-PAK 21 TAB) 10 MG (21) TBPK tablet Take by mouth daily. Take 6 tabs by mouth daily  for 2 days, then 5 tabs for 2 days, then 4 tabs for 2 days, then 3 tabs for 2 days, 2 tabs for 2  days, then 1 tab by mouth daily for 2 days 42 tablet 0   pregabalin (LYRICA) 75 MG capsule Take 1 capsule (75 mg total) by mouth at bedtime. 30 capsule 5   rOPINIRole (REQUIP) 3 MG tablet TAKE 1 TABLET BY MOUTH EVERYDAY AT BEDTIME 90 tablet 1   zinc gluconate 50 MG tablet Take 50 mg by mouth daily.     amiodarone (PACERONE) 200 MG tablet Take 1 tablet (200 mg total) by mouth 2 (two) times daily. (Patient not taking: Reported on 10/14/2022) 60 tablet 1    benzonatate (TESSALON) 200 MG capsule Take 1 capsule (200 mg total) by mouth 3 (three) times daily as needed for up to 7 days. (Patient not taking: Reported on 10/14/2022) 40 capsule 0   No current facility-administered medications for this visit.     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   right shoulder blade   Neuromuscular disorder (HCC)    Restless Leg Syndrome   Pneumonia 2019   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   left nare   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   left upper shin   SCC (squamous cell carcinoma) in situ 03/19/2016   right pinky   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   right thigh, left upper shin   SCC (squamous cell carcinoma) well diff 12/13/2008   left hand   SCC (squamous cell carcinoma) well diff x2 12/25/2015   left and right forearm   Thyroid disease     Past Surgical History:  Procedure Laterality Date   ANKLE SURGERY Right 2000   repaired tendons with pin placed   AORTIC VALVE REPLACEMENT N/A 06/16/2022   Procedure: AORTIC VALVE REPLACEMENT (AVR) USING EDWARDS INSPIRIS SIZE ;  Surgeon: Alleen Borne, MD;  Location: [facility] OR;  Service: Open Heart Surgery;  Laterality: N/A;   CERVICAL CONIZATION W/BX     COLONOSCOPY  2014   EYE SURGERY Bilateral    Cataracts   MELANOMA EXCISION  08/2014   back   RIGHT HEART CATH AND  CORONARY ANGIOGRAPHY N/A 06/02/2022   Procedure: RIGHT HEART CATH AND CORONARY ANGIOGRAPHY;  Surgeon: Tonny Bollman, MD;  [facility] INVASIVE CV LAB;  Service: Cardiovascular;  Laterality: N/A;   SHOULDER SURGERY Right    x2   SHOULDER SURGERY Left    TEE WITHOUT CARDIOVERSION N/A 06/16/2022   Procedure: TRANSESOPHAGEAL ECHOCARDIOGRAM;  Surgeon: Alleen Borne, MD;  Location: [facility] OR;  Service: Open Heart Surgery;  Laterality: N/A;   THUMB ARTHROSCOPY Right    Removed bone in thumb    Social History   Socioeconomic History   Marital status: Divorced    Spouse name: Not on file   Number of children: 2   Years of education:  GED   Highest education level: GED or equivalent  Occupational History   Occupation: disabled  Tobacco Use   Smoking status: Every Day    Packs/day: 0.50    Years: 54.00    Additional pack years: 0.00    Total pack years: 27.00    Types: Cigarettes    Start date: 04/06/1968    Passive exposure: Never   Smokeless tobacco: Never  Vaping Use   Vaping Use: Never used  Substance and Sexual Activity   Alcohol use: No   Drug use: No   Sexual activity: Not Currently    Partners: Male    Birth control/protection: None  Other Topics Concern   Not on file  Social History Narrative   Lives alone. She had two children, one is deceased. Her son lives in [place]. She has a sister who lives close by who helps her a lot. Her enjoys gardening.    Social Determinants of Health   Financial Resource Strain: Low Risk  (07/02/2022)   Overall Financial Resource Strain (CARDIA)    Difficulty of Paying Living Expenses: Not hard at all  Food Insecurity: No Food Insecurity (07/02/2022)   Hunger Vital Sign    Worried About Running Out of Food in the Last Year: Never true    Ran Out of Food in the Last Year: Never true  Transportation Needs: No Transportation Needs (07/02/2022)   PRAPARE - Administrator, Civil Service (Medical): No    Lack of Transportation  (Non-Medical): No  Physical Activity: Inactive (07/02/2022)   Exercise Vital Sign    Days of Exercise per Week: 3 days    Minutes of Exercise per Session: 0 min  Stress: No Stress Concern Present (07/02/2022)   [employer] of Occupational Health - Occupational Stress Questionnaire    Feeling of Stress : Not at all  Social Connections: Moderately Integrated (07/02/2022)   Social Connection and Isolation Panel [NHANES]    Frequency of Communication with Friends and Family: More than three times a week    Frequency of Social Gatherings with Friends and Family: Once a week    Attends Religious Services: 1 to 4 times per year    Active Member of [employer] or Organizations: Yes    Attends Banker Meetings: Never    Marital Status: Divorced  Catering manager Violence: Not At Risk (06/22/2022)   Humiliation, Afraid, Rape, and Kick questionnaire    Fear of Current or Ex-Partner: No    Emotionally Abused: No    Physically Abused: No    Sexually Abused: No    Family History  Problem Relation Age of Onset   Heart disease Mother    Aneurysm Mother    Heart disease Father    Kidney failure Father    Cancer Sister    COPD Sister    Cancer Brother    Cirrhosis Daughter    Cancer Sister        Bronchial    ROS: no fevers or chills, productive cough, hemoptysis, dysphasia, odynophagia, melena, hematochezia, dysuria, hematuria, rash, seizure activity, orthopnea, PND, pedal edema, claudication. Remaining systems are negative.  Physical Exam: Well-developed well-nourished in no acute distress.  Skin diffuse ecchymosis noted HEENT is normal.  Neck is supple.  Chest is clear to auscultation with normal expansion.  Cardiovascular exam is regular rate and rhythm.  2/6 systolic murmur left sternal border radiating to the carotids.  No diastolic murmur noted. Abdominal exam nontender or distended. No masses palpated.  Extremities show no edema. neuro grossly intact A/P  1 status  post aortic valve replacement-follow-up echocardiogram showed normally functioning valve.  Continue SBE prophylaxis.  2 abnormal CTA-question density in the pancreas on previous CTA.  Will arrange follow-up MRI with contrast to better assess.  3 small pulmonary nodules-plan noncontrast chest CT February 2025.  4 hyperlipidemia-continue statin.  5 tobacco abuse-patient counseled on discontinuing.  6 nonobstructive coronary disease-continue statin.  Continue aspirin.  7 postoperative atrial fibrillation-discontinue amiodarone.  Patient remains in sinus rhythm.  Olga Millers, MD

## 2022-10-08 ENCOUNTER — Ambulatory Visit
Admission: EM | Admit: 2022-10-08 | Discharge: 2022-10-08 | Disposition: A | Discharge status: Home / Self Care | Payer: No Typology Code available for payment source | Attending: Family Medicine | Admitting: Family Medicine

## 2022-10-08 ENCOUNTER — Ambulatory Visit: Payer: No Typology Code available for payment source

## 2022-10-08 ENCOUNTER — Encounter: Payer: Self-pay | Admitting: Emergency Medicine

## 2022-10-08 DIAGNOSIS — R059 Cough, unspecified: Secondary | ICD-10-CM | POA: Diagnosis not present

## 2022-10-08 DIAGNOSIS — R062 Wheezing: Secondary | ICD-10-CM | POA: Diagnosis not present

## 2022-10-08 DIAGNOSIS — R918 Other nonspecific abnormal finding of lung field: Secondary | ICD-10-CM | POA: Diagnosis not present

## 2022-10-08 DIAGNOSIS — R0602 Shortness of breath: Secondary | ICD-10-CM

## 2022-10-08 DIAGNOSIS — J441 Chronic obstructive pulmonary disease with (acute) exacerbation: Secondary | ICD-10-CM

## 2022-10-08 MED ORDER — BENZONATATE 200 MG PO CAPS: 200.0000 mg | ORAL_CAPSULE | Freq: Three times a day (TID) | ORAL | 0 refills | Status: AC | PRN

## 2022-10-08 MED ORDER — PREDNISONE 10 MG (21) PO TBPK: ORAL_TABLET | Freq: Every day | ORAL | 0 refills | Status: DC

## 2022-10-08 MED ORDER — DOXYCYCLINE HYCLATE 100 MG PO CAPS: 100.0000 mg | ORAL_CAPSULE | Freq: Two times a day (BID) | ORAL | 0 refills | Status: AC

## 2022-10-08 MED ORDER — METHYLPREDNISOLONE SODIUM SUCC 125 MG IJ SOLR
125.0000 mg | Freq: Once | INTRAMUSCULAR | Status: AC
  Administered 2022-10-08: 125 mg via INTRAMUSCULAR

## 2022-10-08 MED ORDER — IPRATROPIUM-ALBUTEROL 0.5-2.5 (3) MG/3ML IN SOLN
3.0000 mL | Freq: Once | RESPIRATORY_TRACT | Status: AC
  Administered 2022-10-08: 3 mL via RESPIRATORY_TRACT

## 2022-10-08 NOTE — ED Provider Notes (Signed)
[facility]    CSN: [id number] Arrival date & time: 10/08/22  1809      History   Chief Complaint Chief Complaint  Patient presents with   Shortness of Breath    HPI Carolyn [name] is a 72 y.o. [name].   HPI Carolyn 72 year old [name] presents with shortness of breath wheezing and coughing for 4 days.  Patient reports has been using albuterol and Breo inhaler at home. PMH significant for BCC, cervical cancer, and COPD. 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   right shoulder blade   Neuromuscular disorder (HCC)    Restless Leg Syndrome   Pneumonia 2019   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   left nare   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   left upper shin   SCC (squamous cell carcinoma) in situ 03/19/2016   right pinky   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   right thigh, left upper shin   SCC (squamous cell carcinoma) well diff 12/13/2008   left hand   SCC (squamous cell carcinoma) well diff x2 12/25/2015   left and right forearm   Thyroid disease     Patient Active Problem List   Diagnosis Date Noted   S/P AVR 07/01/2022   S/P aortic valve replacement 06/16/2022   Severe aortic stenosis 06/01/2022   Unstable angina (HCC) 06/01/2022   Plantar fasciitis, right 05/21/2022   Nocturnal oxygen desaturation  05/18/2022   Cervical radiculitis 11/16/2021   Hearing loss due to cerumen impaction, right 06/23/2021   Insomnia 04/08/2021   Tobacco abuse 02/10/2017   Peripheral neuropathic pain 08/04/2016   Pulmonary nodule 07/15/2016   Constipation 04/13/2016   Aortic atherosclerosis (HCC) 01/13/2016   COPD exacerbation (HCC) 01/13/2016   Depression 07/11/2015   GAD (generalized anxiety disorder) 07/11/2015   Allergic rhinitis 07/11/2015   Hypothyroidism 07/11/2015   Restless leg syndrome 07/11/2015   Atypical glandular cells of undetermined significance (AGUS) on cervical Pap smear 11/06/2013   Postmenopausal state 10/09/2013   History of cervical dysplasia 10/09/2013    Past Surgical History:  Procedure Laterality Date   ANKLE SURGERY Right 2000   repaired tendons with pin placed   AORTIC VALVE REPLACEMENT N/A 06/16/2022   Procedure: AORTIC VALVE REPLACEMENT (AVR) USING EDWARDS INSPIRIS SIZE ;  Surgeon: Alleen Borne, MD;  Location: [facility] OR;  Service: Open Heart Surgery;  Laterality: N/A;   CERVICAL CONIZATION W/BX     COLONOSCOPY  2014   EYE SURGERY Bilateral    Cataracts   MELANOMA EXCISION  08/2014   back   RIGHT HEART CATH AND CORONARY ANGIOGRAPHY N/A 06/02/2022   Procedure: RIGHT HEART CATH AND CORONARY ANGIOGRAPHY;  Surgeon: Tonny Bollman, MD;  Location: [facility] INVASIVE  CV LAB;  Service: Cardiovascular;  Laterality: N/A;   SHOULDER SURGERY Right    x2   SHOULDER SURGERY Left    TEE WITHOUT CARDIOVERSION N/A 06/16/2022   Procedure: TRANSESOPHAGEAL ECHOCARDIOGRAM;  Surgeon: Alleen Borne, MD;  Location: [facility] OR;  Service: Open Heart Surgery;  Laterality: N/A;   THUMB ARTHROSCOPY Right    Removed bone in thumb    OB History     Gravida  3   Para  2   Term  2   Preterm  0   AB  1   Living  2      SAB  1   IAB      Ectopic      Multiple      Live Births  2            Home Medications    Prior to Admission medications   Medication Sig Start Date  End Date Taking? Authorizing Provider  albuterol (VENTOLIN HFA) 108 (90 Base) MCG/ACT inhaler TAKE 2 PUFFS BY MOUTH EVERY 6 HOURS AS NEEDED FOR WHEEZE OR SHORTNESS OF BREATH 09/29/22  Yes Everrett Coombe, DO  AMBULATORY NON FORMULARY MEDICATION Home health:  Physical therapy-Evaluate and treat, Occupational therapy-Evaluate and treat, Nursing-Teaching on heart healthy diet, medications, 07/01/22  Yes Everrett Coombe, DO  amiodarone (PACERONE) 200 MG tablet Take 1 tablet (200 mg total) by mouth 2 (two) times daily. 06/24/22  Yes Roddenberry, Cecille Amsterdam, PA-C  ascorbic acid (VITAMIN C) 500 MG tablet Take 500 mg by mouth daily.   Yes [provider]  aspirin 81 MG EC tablet TAKE 1 TABLET BY MOUTH EVERY DAY Patient taking differently: Take 81 mg by mouth daily. 11/11/18  Yes Remus Loffler, PA-C  atorvastatin (LIPITOR) 10 MG tablet TAKE 1 TABLET BY MOUTH EVERY DAY 02/24/22  Yes Everrett Coombe, DO  baclofen (LIORESAL) 10 MG tablet TAKE 1 TABLET BY MOUTH THREE TIMES A DAY AS NEEDED FOR MUSCLE SPASMS 09/30/22  Yes Everrett Coombe, DO  benzonatate (TESSALON) 200 MG capsule Take 1 capsule (200 mg total) by mouth 3 (three) times daily as needed for up to 7 days. 10/08/22 10/15/22 Yes Trevor Iha, FNP  BREO ELLIPTA 100-25 MCG/ACT AEPB TAKE 1 PUFF BY MOUTH EVERY DAY 02/24/22  Yes Everrett Coombe, DO  Calcium Carb-Cholecalciferol (CALCIUM 600 + D PO) Take 1 tablet by mouth 2 (two) times daily.   Yes [provider]  Cholecalciferol (DIALYVITE VITAMIN D 5000) 125 MCG (5000 UT) capsule Take 5,000 Units by mouth daily.   Yes [provider]  clonazePAM (KLONOPIN) 0.5 MG tablet TAKE 1 TABLET BY MOUTH EVERYDAY AT BEDTIME 09/30/22  Yes Wachs, Erika S, DO  doxycycline (VIBRAMYCIN) 100 MG capsule Take 1 capsule (100 mg total) by mouth 2 (two) times daily for 10 days. 10/08/22 10/18/22 Yes Trevor Iha, FNP  fluticasone (FLONASE) 50 MCG/ACT nasal spray SPRAY 1 SPRAY INTO EACH NOSTRIL DAILY 07/27/22  Yes  Everrett Coombe, DO  Garlic 1000 MG CAPS Take 1,000 mg by mouth daily.   Yes [provider]  levofloxacin (LEVAQUIN) 500 MG tablet Take 1 tablet (500 mg total) by mouth daily. 07/28/22  Yes Eustace Moore, MD  levothyroxine (SYNTHROID) 100 MCG tablet TAKE 1 TABLET BY MOUTH EVERY DAY BEFORE BREAKFAST 09/30/22  Yes Everrett Coombe, DO  magnesium hydroxide (MILK OF MAGNESIA) 400 MG/5ML suspension Take 15 mLs by mouth daily as needed for mild constipation.   Yes [provider]  Multiple Vitamins-Minerals (CENTRUM SILVER  ADULT 50+ PO) Take 1 tablet by mouth daily.   Yes [provider]  Omega-3 Fatty Acids (FISH OIL) 1200 MG CAPS Take 1,200 mg by mouth daily.   Yes [provider]  predniSONE (STERAPRED UNI-PAK 21 TAB) 10 MG (21) TBPK tablet Take by mouth daily. Take 6 tabs by mouth daily  for 2 days, then 5 tabs for 2 days, then 4 tabs for 2 days, then 3 tabs for 2 days, 2 tabs for 2 days, then 1 tab by mouth daily for 2 days 10/08/22  Yes Trevor Iha, FNP  pregabalin (LYRICA) 75 MG capsule Take 1 capsule (75 mg total) by mouth at bedtime. 09/30/22  Yes Sater, Pearletha Furl, MD  rOPINIRole (REQUIP) 3 MG tablet TAKE 1 TABLET BY MOUTH EVERYDAY AT BEDTIME 09/30/22  Yes Everrett Coombe, DO  zinc gluconate 50 MG tablet Take 50 mg by mouth daily.   Yes [provider]    Family History Family History  Problem Relation Age of Onset   Heart disease Mother    Aneurysm Mother    Heart disease Father    Kidney failure Father    Cancer Sister    COPD Sister    Cancer Brother    Cirrhosis Daughter    Cancer Sister        Bronchial    Social History Social History   Tobacco Use   Smoking status: Every Day    Packs/day: 0.50    Years: 54.00    Additional pack years: 0.00    Total pack years: 27.00    Types: Cigarettes    Start date: 04/06/1968    Passive exposure: Never   Smokeless tobacco: Never  Vaping Use   Vaping Use: Never used  Substance Use Topics    Alcohol use: No   Drug use: No     Allergies   Trazodone and nefazodone and Azithromycin   Review of Systems Review of Systems  Respiratory:  Positive for shortness of breath.      Physical Exam Triage Vital Signs ED Triage Vitals  Enc Vitals Group     BP      Pulse      Resp      Temp      Temp src      SpO2      Weight      Height      Head Circumference      Peak Flow      Pain Score      Pain Loc      Pain Edu?      Excl. in GC?    No data found.  Updated Vital Signs BP 114/73 (BP Location: Right Arm)   Pulse 98   Temp 98.5 F (36.9 C) (Oral)   Resp (!) 22   SpO2 (S) 90% Comment: on return to room from xray  Physical Exam Vitals and nursing note reviewed.  Constitutional:      Appearance: Normal appearance. She is well-developed. She is obese. She is ill-appearing.  HENT:     Head: Normocephalic and atraumatic.     Right Ear: Tympanic membrane, ear canal and external ear normal.     Left Ear: Tympanic membrane, ear canal and external ear normal.     Mouth/Throat:     Mouth: Mucous membranes are moist.     Pharynx: Oropharynx is clear.  Eyes:     Extraocular Movements: Extraocular movements intact.     Conjunctiva/sclera: Conjunctivae  normal.     Pupils: Pupils are equal, round, and reactive to light.  Cardiovascular:     Rate and Rhythm: Normal rate and regular rhythm.     Pulses: Normal pulses.     Heart sounds: Normal heart sounds.  Pulmonary:     Effort: Pulmonary effort is normal. No respiratory distress.     Breath sounds: Examination of the right-lower field reveals decreased breath sounds. Examination of the left-lower field reveals decreased breath sounds. Decreased breath sounds, wheezing and rhonchi present.     Comments: Diffuse scattered rhonchi with inspiratory expiratory wheezes noted throughout, diminished breath sounds over bases; infrequent nonproductive to productive cough on exam Musculoskeletal:        General: Normal  range of motion.     Cervical back: Normal range of motion and neck supple.  Skin:    General: Skin is warm and dry.  Neurological:     General: No focal deficit present.     Mental Status: She is alert and oriented to person, place, and time.  Psychiatric:        Mood and Affect: Mood normal.        Behavior: Behavior normal.      UC Treatments / Results  Labs (all labs ordered are listed, but only abnormal results are displayed) Labs Reviewed - No data to display  EKG   Radiology DG Chest 2 View  Result Date: 10/08/2022 CLINICAL DATA:  Shortness of breath and wheezing for 4 days EXAM: CHEST - 2 VIEW COMPARISON:  Chest x-ray September 07, 2022 FINDINGS: The cardiomediastinal silhouette is unchanged in contour status post median sternotomy and aortic valve replacement. Chronic left midlung bandlike opacity, likely scarring. New subcentimeter cystic lucencies in the right lower lobe. Diffuse interstitial prominence, consistent with history of COPD. No pleural effusion or pneumothorax. The visualized upper abdomen is unremarkable. No acute osseous abnormality. IMPRESSION: New subcentimeter cystic lucencies in the right lower lobe, of uncertain significance and possibly related to underlying COPD. However, recommend further assessment with CT chest without contrast. Electronically Signed   By: Jacob Moores M.D.   On: 10/08/2022 19:02    Procedures Procedures (including critical care time)  Medications Ordered in UC Medications  ipratropium-albuterol (DUONEB) 0.5-2.5 (3) MG/3ML nebulizer solution 3 mL (3 mLs Nebulization Given 10/08/22 1831)  methylPREDNISolone sodium succinate (SOLU-MEDROL) 125 mg/2 mL injection 125 mg (125 mg Intramuscular Given 10/08/22 1856)    Initial Impression / Assessment and Plan / UC Course  I have reviewed the triage vital signs and the nursing notes.  Pertinent labs & imaging results that were available during my care of the patient were reviewed by me and  considered in my medical decision making (see chart for details).    MDM: 1.  Shortness of breath DuoNeb 0.5-2.53 mg / 3 mL nebulized solution given once and clinic, IM Solu-Medrol 125 mg given once in clinic SpO2 dropped to 87% advised patient that I would call EMS now to transfer her to nearest emergency department for shortness of breath patient has refused to go to ED either by personal vehicle or ambulance and will sign AMA statement now. Prior to discharge patient SpO2 increased to 95% prior to walking out the door-patient reports symptoms much improved; 2.  COPD exacerbation-Rx'd Sterapred Unipak (tapering patient from 60 mg to 10 mg over 10 days).  Cough, unspecified type-Rx'd Doxycycline 100 mg capsule twice daily x 10 days. Patient advised of chest x-ray results with hardcopy provided advised patient to  follow-up with PCP with CT of the chest as advised by radiology.  Advised patient to take medications as directed with food to completion.  Advised patient to take prednisone with first dose of doxycycline for the next 10 days.  Advised patient may take Tessalon Perles daily or as needed for cough.  Encouraged patient to increase daily water intake to 64 ounces per day while taking these medications.  Advised patient if her shortness of breath or symptoms worsen and/or unresolved please go to nearest ED for further evaluation.    Final Clinical Impressions(s) / UC Diagnoses   Final diagnoses:  COPD exacerbation (HCC)  SOB (shortness of breath)  Cough, unspecified type     Discharge Instructions      Patient advised of chest x-ray results with hardcopy provided advised patient to follow-up with PCP with CT of the chest as advised by radiology.  Advised patient to take medications as directed with food to completion.  Advised patient to take prednisone with first dose of doxycycline for the next 10 days.  Advised patient may take Tessalon Perles daily or as needed for cough.  Encouraged  patient to increase daily water intake to 64 ounces per day while taking these medications.  Advised patient if her shortness of breath or symptoms worsen and/or unresolved please go to nearest ED for further evaluation.     ED Prescriptions     Medication Sig Dispense Auth. Provider   doxycycline (VIBRAMYCIN) 100 MG capsule Take 1 capsule (100 mg total) by mouth 2 (two) times daily for 10 days. 20 capsule Trevor Iha, FNP   predniSONE (STERAPRED UNI-PAK 21 TAB) 10 MG (21) TBPK tablet Take by mouth daily. Take 6 tabs by mouth daily  for 2 days, then 5 tabs for 2 days, then 4 tabs for 2 days, then 3 tabs for 2 days, 2 tabs for 2 days, then 1 tab by mouth daily for 2 days 42 tablet Trevor Iha, FNP   benzonatate (TESSALON) 200 MG capsule Take 1 capsule (200 mg total) by mouth 3 (three) times daily as needed for up to 7 days. 40 capsule Trevor Iha, FNP      PDMP not reviewed this encounter.   Trevor Iha, FNP 10/08/22 1924

## 2022-10-08 NOTE — ED Triage Notes (Signed)
Patient c/o SOB, wheezing, coughing x 4 days.  Patient does a history of COPD.  Patient has used her Albuterol Inhaler and Breo Inhaler.  Cough is worse at night.

## 2022-10-08 NOTE — Discharge Instructions (Addendum)
Patient advised of chest x-ray results with hardcopy provided advised patient to follow-up with PCP with CT of the chest as advised by radiology.  Advised patient to take medications as directed with food to completion.  Advised patient to take prednisone with first dose of doxycycline for the next 10 days.  Advised patient may take Tessalon Perles daily or as needed for cough.  Encouraged patient to increase daily water intake to 64 ounces per day while taking these medications.  Advised patient if her shortness of breath or symptoms worsen and/or unresolved please go to nearest ED for further evaluation.

## 2022-10-14 ENCOUNTER — Ambulatory Visit (INDEPENDENT_AMBULATORY_CARE_PROVIDER_SITE_OTHER): Payer: No Typology Code available for payment source | Admitting: Cardiology

## 2022-10-14 ENCOUNTER — Encounter: Payer: Self-pay | Admitting: Cardiology

## 2022-10-14 VITALS — BP 140/78 | HR 85 | Ht 65.0 in | Wt 172.2 lb

## 2022-10-14 DIAGNOSIS — Z720 Tobacco use: Secondary | ICD-10-CM | POA: Diagnosis not present

## 2022-10-14 DIAGNOSIS — Z952 Presence of prosthetic heart valve: Secondary | ICD-10-CM | POA: Diagnosis not present

## 2022-10-14 DIAGNOSIS — I2510 Atherosclerotic heart disease of native coronary artery without angina pectoris: Secondary | ICD-10-CM

## 2022-10-14 DIAGNOSIS — K8689 Other specified diseases of pancreas: Secondary | ICD-10-CM

## 2022-10-14 DIAGNOSIS — E7800 Pure hypercholesterolemia, unspecified: Secondary | ICD-10-CM | POA: Diagnosis not present

## 2022-10-14 NOTE — Patient Instructions (Signed)
   Testing/Procedures:  MRI OF THE ABDOMEN W/WO CONTRAST AT THE [facility]   Follow-Up: At [facility], you and your health needs are our priority.  As part of our continuing mission to provide you with exceptional heart care, we have created designated Provider Care Teams.  These Care Teams include your primary Cardiologist (physician) and Advanced Practice Providers (APPs -  Physician Assistants and Nurse Practitioners) who all work together to provide you with the care you need, when you need it.  We recommend signing up for the patient portal called "MyChart".  Sign up information is provided on this After Visit Summary.  MyChart is used to connect with patients for Virtual Visits (Telemedicine).  Patients are able to view lab/test results, encounter notes, upcoming appointments, etc.  Non-urgent messages can be sent to your provider as well.   To learn more about what you can do with MyChart, go to [email or web address].    Your next appointment:   6 month(s)  Provider:   Olga Millers, MD

## 2022-10-19 ENCOUNTER — Other Ambulatory Visit (HOSPITAL_COMMUNITY): Payer: No Typology Code available for payment source

## 2022-10-20 ENCOUNTER — Telehealth: Payer: No Typology Code available for payment source | Admitting: Adult Health

## 2022-10-20 DIAGNOSIS — G2581 Restless legs syndrome: Secondary | ICD-10-CM

## 2022-10-20 NOTE — Progress Notes (Signed)
PATIENT: Carolyn [name] DOB: 1950-10-24  REASON FOR VISIT: follow up HISTORY FROM: patient  Virtual Visit via Video Note  I connected with Baldemar Friday on 10/20/22 at  1:45 PM EDT by a video enabled telemedicine application located remotely at [facility] and verified that I am speaking with the correct person using two identifiers who was located at their own home.   I discussed the limitations of evaluation and management by telemedicine and the availability of in person appointments. The patient expressed understanding and agreed to proceed.   PATIENT: Carolyn [name] DOB: 04-Dec-1950  REASON FOR VISIT: follow up HISTORY FROM: patient  HISTORY OF PRESENT ILLNESS: Today 10/20/22:  Carolyn [name] is a 72 y.o. female with a history of RLS. Returns today for follow-up.  She was started on Lyrica 75 mg at bedtime.  She reports that the Lyrica in combination with Requip works well for her restless legs.  She states occasionally she may have symptoms earlier in the afternoons she states that she typically will take an extra Requip.  Otherwise she feels that her symptoms are well-managed.  She has an appointment next month with her primary care provider.   HISTORY Brief HPI: 72 year old female with a history of hypothyroidism, HLD, COPD who follows in clinic for RLS.   At her last visit she was started on Lyrica 75 QHS and continued on ropinirole. Iron levels were low and she was started on iron supplementation.   Interval History: Her RLS has significantly improved since her last visit. She has some residual intermittent mild tingling, but this is not particularly bothersome to her. She is tolerating Lyrica well without issues, and has been able to wean Requip down to just 3 mg at bedtime. She took iron supplements for ~3 months, and has not been on these since August.   Previous therapies: Klonopin 0.5 mg QHS Requip 1.5/1.5/3 mg Baclofen 10 mg  TID Gabapentin 300 mg TID - urinary incontinence at night  REVIEW OF SYSTEMS: Out of a complete 14 system review of symptoms, the patient complains only of the following symptoms, and all other reviewed systems are negative.  ALLERGIES: Allergies  Allergen Reactions   Trazodone And Nefazodone Shortness Of Breath   Azithromycin Rash    HOME MEDICATIONS: Outpatient Medications Prior to Visit  Medication Sig Dispense Refill   albuterol (VENTOLIN HFA) 108 (90 Base) MCG/ACT inhaler TAKE 2 PUFFS BY MOUTH EVERY 6 HOURS AS NEEDED FOR WHEEZE OR SHORTNESS OF BREATH 8.5 each 2   AMBULATORY NON FORMULARY MEDICATION Home health:  Physical therapy-Evaluate and treat, Occupational therapy-Evaluate and treat, Nursing-Teaching on heart healthy diet, medications, 1 Units 0   ascorbic acid (VITAMIN C) 500 MG tablet Take 500 mg by mouth daily.     aspirin 81 MG EC tablet TAKE 1 TABLET BY MOUTH EVERY DAY (Patient taking differently: Take 81 mg by mouth daily.) 90 tablet 3   atorvastatin (LIPITOR) 10 MG tablet TAKE 1 TABLET BY MOUTH EVERY DAY 90 tablet 3   baclofen (LIORESAL) 10 MG tablet TAKE 1 TABLET BY MOUTH THREE TIMES A DAY AS NEEDED FOR MUSCLE SPASMS 270 tablet 1   BREO ELLIPTA 100-25 MCG/ACT AEPB TAKE 1 PUFF BY MOUTH EVERY DAY 60 each 11   Calcium Carb-Cholecalciferol (CALCIUM 600 + D PO) Take 1 tablet by mouth 2 (two) times daily.     Cholecalciferol (DIALYVITE VITAMIN D 5000) 125 MCG (5000 UT) capsule Take 5,000 Units by mouth daily.  clonazePAM (KLONOPIN) 0.5 MG tablet TAKE 1 TABLET BY MOUTH EVERYDAY AT BEDTIME 30 tablet 0   fluticasone (FLONASE) 50 MCG/ACT nasal spray SPRAY 1 SPRAY INTO EACH NOSTRIL DAILY 48 mL 1   Garlic 1000 MG CAPS Take 1,000 mg by mouth daily.     levofloxacin (LEVAQUIN) 500 MG tablet Take 1 tablet (500 mg total) by mouth daily. 7 tablet 0   levothyroxine (SYNTHROID) 100 MCG tablet TAKE 1 TABLET BY MOUTH EVERY DAY BEFORE BREAKFAST 90 tablet 1   magnesium hydroxide (MILK OF  MAGNESIA) 400 MG/5ML suspension Take 15 mLs by mouth daily as needed for mild constipation.     Multiple Vitamins-Minerals (CENTRUM SILVER ADULT 50+ PO) Take 1 tablet by mouth daily.     Omega-3 Fatty Acids (FISH OIL) 1200 MG CAPS Take 1,200 mg by mouth daily.     predniSONE (STERAPRED UNI-PAK 21 TAB) 10 MG (21) TBPK tablet Take by mouth daily. Take 6 tabs by mouth daily  for 2 days, then 5 tabs for 2 days, then 4 tabs for 2 days, then 3 tabs for 2 days, 2 tabs for 2 days, then 1 tab by mouth daily for 2 days 42 tablet 0   pregabalin (LYRICA) 75 MG capsule Take 1 capsule (75 mg total) by mouth at bedtime. 30 capsule 5   rOPINIRole (REQUIP) 3 MG tablet TAKE 1 TABLET BY MOUTH EVERYDAY AT BEDTIME 90 tablet 1   zinc gluconate 50 MG tablet Take 50 mg by mouth daily.     No facility-administered medications prior to visit.    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   right shoulder blade   Neuromuscular disorder (HCC)    Restless Leg Syndrome   Pneumonia 2019   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   left nare   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   left upper shin   SCC (squamous cell carcinoma) in situ 03/19/2016   right pinky   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   right thigh, left upper shin   SCC (squamous cell carcinoma) well diff 12/13/2008    left hand   SCC (squamous cell carcinoma) well diff x2 12/25/2015   left and right forearm   Thyroid disease     PAST SURGICAL HISTORY: Past Surgical History:  Procedure Laterality Date   ANKLE SURGERY Right 2000   repaired tendons with pin placed   AORTIC VALVE REPLACEMENT N/A 06/16/2022   Procedure: AORTIC VALVE REPLACEMENT (AVR) USING EDWARDS INSPIRIS SIZE ;  Surgeon: Alleen Borne, MD;  Location: [facility] OR;  Service: Open Heart Surgery;  Laterality: N/A;   CERVICAL CONIZATION W/BX     COLONOSCOPY  2014   EYE SURGERY Bilateral    Cataracts  MELANOMA EXCISION  08/2014   back   RIGHT HEART CATH AND CORONARY ANGIOGRAPHY N/A 06/02/2022   Procedure: RIGHT HEART CATH AND CORONARY ANGIOGRAPHY;  Surgeon: Tonny Bollman, MD;  Location: [facility] INVASIVE CV LAB;  Service: Cardiovascular;  Laterality: N/A;   SHOULDER SURGERY Right    x2   SHOULDER SURGERY Left    TEE WITHOUT CARDIOVERSION N/A 06/16/2022   Procedure: TRANSESOPHAGEAL ECHOCARDIOGRAM;  Surgeon: Alleen Borne, MD;  Location: [facility] OR;  Service: Open Heart Surgery;  Laterality: N/A;   THUMB ARTHROSCOPY Right    Removed bone in thumb    FAMILY HISTORY: Family History  Problem Relation Age of Onset   Heart disease Mother    Aneurysm Mother    Heart disease Father    Kidney failure Father    Cancer Sister    COPD Sister    Cancer Brother    Cirrhosis Daughter    Cancer Sister        Bronchial    SOCIAL HISTORY: Social History   Socioeconomic History   Marital status: Divorced    Spouse name: Not on file   Number of children: 2   Years of education: GED   Highest education level: GED or equivalent  Occupational History   Occupation: disabled  Tobacco Use   Smoking status: Every Day    Current packs/day: 0.50    Average packs/day: 0.5 packs/day for 54.5 years (27.3 ttl pk-yrs)    Types: Cigarettes    Start date: 04/06/1968    Passive exposure: Never   Smokeless tobacco: Never  Vaping Use   Vaping status:  Never Used  Substance and Sexual Activity   Alcohol use: No   Drug use: No   Sexual activity: Not Currently    Partners: Male    Birth control/protection: None  Other Topics Concern   Not on file  Social History Narrative   Lives alone. She had two children, one is deceased. Her son lives in [place]. She has a sister who lives close by who helps her a lot. Her enjoys gardening.    Social Determinants of Health   Financial Resource Strain: Low Risk  (07/02/2022)   Overall Financial Resource Strain (CARDIA)    Difficulty of Paying Living Expenses: Not hard at all  Food Insecurity: No Food Insecurity (07/02/2022)   Hunger Vital Sign    Worried About Running Out of Food in the Last Year: Never true    Ran Out of Food in the Last Year: Never true  Transportation Needs: No Transportation Needs (07/02/2022)   PRAPARE - Administrator, Civil Service (Medical): No    Lack of Transportation (Non-Medical): No  Physical Activity: Inactive (07/02/2022)   Exercise Vital Sign    Days of Exercise per Week: 3 days    Minutes of Exercise per Session: 0 min  Stress: No Stress Concern Present (07/02/2022)   [employer] of Occupational Health - Occupational Stress Questionnaire    Feeling of Stress : Not at all  Social Connections: Moderately Integrated (07/02/2022)   Social Connection and Isolation Panel [NHANES]    Frequency of Communication with Friends and Family: More than three times a week    Frequency of Social Gatherings with Friends and Family: Once a week    Attends Religious Services: 1 to 4 times per year    Active Member of [employer] or Organizations: Yes    Attends Banker Meetings: Never    Marital Status: Divorced  Intimate  Partner Violence: Not At Risk (06/22/2022)   Humiliation, Afraid, Rape, and Kick questionnaire    Fear of Current or Ex-Partner: No    Emotionally Abused: No    Physically Abused: No    Sexually Abused: No      PHYSICAL  EXAM Generalized: Well developed, in no acute distress   Neurological examination  Mentation: Alert oriented to time, place, history taking. Follows all commands speech and language fluent Cranial nerve II-XII: Facial symmetry noted DIAGNOSTIC DATA (LABS, IMAGING, TESTING) - I reviewed patient records, labs, notes, testing and imaging myself where available.  Lab Results  Component Value Date   WBC 10.1 07/01/2022   HGB 11.9 07/01/2022   HCT 36.6 07/01/2022   MCV 90.4 07/01/2022   PLT 403 (H) 07/01/2022      Component Value Date/Time   NA 137 07/01/2022 1630   NA 131 (L) 10/14/2018 1114   K 4.7 07/01/2022 1630   CL 97 (L) 07/01/2022 1630   CO2 28 07/01/2022 1630   GLUCOSE 120 (H) 07/01/2022 1630   BUN 25 07/01/2022 1630   BUN 11 10/14/2018 1114   CREATININE 1.00 07/01/2022 1630   CALCIUM 9.6 07/01/2022 1630   PROT 7.2 07/01/2022 1630   PROT 6.8 10/14/2018 1114   ALBUMIN 3.5 06/12/2022 1430   ALBUMIN 4.6 10/14/2018 1114   AST 21 07/01/2022 1630   ALT 14 07/01/2022 1630   ALKPHOS 67 06/12/2022 1430   BILITOT 0.4 07/01/2022 1630   BILITOT 0.5 10/14/2018 1114   GFRNONAA >60 06/23/2022 0101   GFRAA 88 10/14/2018 1114   Lab Results  Component Value Date   CHOL 135 06/02/2022   HDL 73 06/02/2022   LDLCALC 49 06/02/2022   TRIG 67 06/02/2022   CHOLHDL 1.8 06/02/2022   Lab Results  Component Value Date   HGBA1C 5.8 (H) 06/12/2022   No results found for: "VITAMINB12" Lab Results  Component Value Date   TSH 7.239 (H) 06/02/2022      ASSESSMENT AND PLAN 72 y.o. 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 here with:  RLS  -Continue Lyrica 75 mg at bedtime -Currently on Requip 3 mg at bedtime prescribed by her PCP.  I did caution the patient about taking an extra tablet of Requip.  Advised that she could break the tablet in half if she needs to take the medication sooner. -Advised that if her symptoms worsen or she develops new symptoms she should let us know -She can follow-up with our office as needed providing that her PCP is amenable to refilling Lyrica for her.  If not she will follow-up with our office yearly      Butch Penny, MSN, NP-C 10/20/2022, 1:56 PM [facility] [address] [place], [place] [address] [phone]

## 2022-10-26 ENCOUNTER — Ambulatory Visit: Payer: No Typology Code available for payment source

## 2022-10-27 ENCOUNTER — Ambulatory Visit (HOSPITAL_BASED_OUTPATIENT_CLINIC_OR_DEPARTMENT_OTHER): Payer: No Typology Code available for payment source | Attending: Family Medicine | Admitting: Internal Medicine

## 2022-10-27 DIAGNOSIS — G4734 Idiopathic sleep related nonobstructive alveolar hypoventilation: Secondary | ICD-10-CM | POA: Insufficient documentation

## 2022-10-27 DIAGNOSIS — R0902 Hypoxemia: Secondary | ICD-10-CM | POA: Insufficient documentation

## 2022-10-27 DIAGNOSIS — R0683 Snoring: Secondary | ICD-10-CM | POA: Insufficient documentation

## 2022-10-28 ENCOUNTER — Telehealth: Payer: Self-pay | Admitting: *Deleted

## 2022-10-28 NOTE — Telephone Encounter (Signed)
Left message for pt to call, they were not able to start an IV in the [facility] imaging department for her MRI. Need to schedule MRI at the hospital so IV team can help.

## 2022-10-28 NOTE — Telephone Encounter (Signed)
Patient is returning call. Requesting call back 

## 2022-10-31 DIAGNOSIS — G4734 Idiopathic sleep related nonobstructive alveolar hypoventilation: Secondary | ICD-10-CM

## 2022-10-31 NOTE — Procedures (Signed)
   Patient Name: Carolyn [name], Carolyn [name] Date: 10/27/2022 Gender: Female D.O.B: 10-03-1950 Age (years): 55 Referring Provider: Everrett Coombe MD Height (inches): 65 Interpreting Physician: Jetty Duhamel MD, ABSM Weight (lbs): 170 RPSGT: Ulyess Mort BMI: 28 MRN: [id number] Neck Size: 14.00  CLINICAL INFORMATION Sleep Study Type: NPSG Indication for sleep study: COPD, Fatigue Epworth Sleepiness Score: 0  SLEEP STUDY TECHNIQUE As per the AASM Manual for the Scoring of Sleep and Associated Events v2.3 (April 2016) with a hypopnea requiring 4% desaturations.  The channels recorded and monitored were frontal, central and occipital EEG, electrooculogram (EOG), submentalis EMG (chin), nasal and oral airflow, thoracic and abdominal wall motion, anterior tibialis EMG, snore microphone, electrocardiogram, and pulse oximetry.  MEDICATIONS Medications self-administered by patient taken the night of the study : CLONAZEPAM, lyrica, REQUIP  SLEEP ARCHITECTURE The study was initiated at 9:45:58 PM and ended at 4:55:02 AM.  Sleep onset time was 4.7 minutes and the sleep efficiency was 92.3%. The total sleep time was 396 minutes.  Stage REM latency was 141.0 minutes.  The patient spent 10.4% of the night in stage N1 sleep, 73.1% in stage N2 sleep, 0.0% in stage N3 and 16.5% in REM.  Alpha intrusion was absent.  Supine sleep was 56.06%.  RESPIRATORY PARAMETERS The overall apnea/hypopnea index (AHI) was 0.8 per hour. There were 1 total apneas, including 1 obstructive, 0 central and 0 mixed apneas. There were 4 hypopneas and 8 RERAs.  The AHI during Stage REM sleep was 1.8 per hour.  AHI while supine was 1.1 per hour.  The mean oxygen saturation was 91.0%. The minimum SpO2 during sleep was 84.0%.  moderate snoring was noted during this study.  CARDIAC DATA The 2 lead EKG demonstrated sinus rhythm. The mean heart rate was 72.9 beats per minute. Other EKG findings include: PVCs.  LEG  MOVEMENT DATA The total PLMS were 0 with a resulting PLMS index of 0.0. Associated arousal with leg movement index was 0.6 .  IMPRESSIONS - No significant obstructive sleep apnea occurred during this study (AHI = 0.8/h). - Oxygen desaturation was noted during this study (Min O2 = 84.0%). Supplemental O2 1L was added per protocol at 11:53 PM for sustained O2 saturation 88% and below. Subsequent O2 saturation about 90-92%. - The patient snored with moderate snoring volume. - EKG findings include PVCs. - Clinically significant periodic limb movements did not occur during sleep. No significant associated arousals.  DIAGNOSIS - Nocturnal Hypoxemia (G47.36) - Primary Snoring  RECOMMENDATIONS - Evaluate for nocturnal hypoxemia. - Sleep hygiene should be reviewed to assess factors that may improve sleep quality. - Weight management and regular exercise should be initiated or continued if appropriate.  [Electronically signed] 10/31/2022 11:57 AM  Jetty Duhamel MD, ABSM Diplomate, American Board of Sleep Medicine NPI: [id number]                          Jetty Duhamel Diplomate, American Board of Sleep Medicine  ELECTRONICALLY SIGNED ON:  10/31/2022, 11:53 AM [facility] PH: [phone]   FX: [phone] ACCREDITED BY THE AMERICAN ACADEMY OF SLEEP MEDICINE

## 2022-11-02 ENCOUNTER — Other Ambulatory Visit: Payer: Self-pay | Admitting: Family Medicine

## 2022-11-02 DIAGNOSIS — M5412 Radiculopathy, cervical region: Secondary | ICD-10-CM

## 2022-11-02 DIAGNOSIS — Z87410 Personal history of cervical dysplasia: Secondary | ICD-10-CM

## 2022-11-03 NOTE — Telephone Encounter (Signed)
Left message for pt to call.

## 2022-11-04 ENCOUNTER — Encounter: Payer: Self-pay | Admitting: *Deleted

## 2022-11-04 NOTE — Telephone Encounter (Signed)
Pt returning call

## 2022-11-04 NOTE — Telephone Encounter (Signed)
Pt is returning a call to D. Betsey Holiday, Charity fundraiser.

## 2022-11-04 NOTE — Telephone Encounter (Signed)
Spoke with pt, aware she will need to have MRI at [facility] for IV team to help. She is scheduled for 11/11/22 @ 3 pm. She will need to arrive at 2 pm with NPO 4 hours prior to the scan. Instructions and directions sent to patient via my chart.

## 2022-11-11 ENCOUNTER — Ambulatory Visit (HOSPITAL_COMMUNITY)
Admission: RE | Admit: 2022-11-11 | Discharge: 2022-11-11 | Disposition: A | Discharge status: Home / Self Care | Payer: No Typology Code available for payment source | Source: Ambulatory Visit | Attending: Cardiology | Admitting: Cardiology

## 2022-11-11 DIAGNOSIS — I700 Atherosclerosis of aorta: Secondary | ICD-10-CM | POA: Diagnosis not present

## 2022-11-11 DIAGNOSIS — K8689 Other specified diseases of pancreas: Secondary | ICD-10-CM | POA: Diagnosis not present

## 2022-11-11 DIAGNOSIS — H43393 Other vitreous opacities, bilateral: Secondary | ICD-10-CM | POA: Diagnosis not present

## 2022-11-11 DIAGNOSIS — K838 Other specified diseases of biliary tract: Secondary | ICD-10-CM | POA: Diagnosis not present

## 2022-11-11 DIAGNOSIS — H26492 Other secondary cataract, left eye: Secondary | ICD-10-CM | POA: Diagnosis not present

## 2022-11-11 DIAGNOSIS — H524 Presbyopia: Secondary | ICD-10-CM | POA: Diagnosis not present

## 2022-11-11 DIAGNOSIS — H52223 Regular astigmatism, bilateral: Secondary | ICD-10-CM | POA: Diagnosis not present

## 2022-11-11 DIAGNOSIS — Z961 Presence of intraocular lens: Secondary | ICD-10-CM | POA: Diagnosis not present

## 2022-11-11 MED ORDER — GADOBUTROL 1 MMOL/ML IV SOLN
8.0000 mL | Freq: Once | INTRAVENOUS | Status: AC | PRN
  Administered 2022-11-11: 8 mL via INTRAVENOUS

## 2022-11-16 ENCOUNTER — Encounter: Payer: Self-pay | Admitting: Family Medicine

## 2022-11-16 ENCOUNTER — Ambulatory Visit (INDEPENDENT_AMBULATORY_CARE_PROVIDER_SITE_OTHER): Payer: No Typology Code available for payment source | Admitting: Family Medicine

## 2022-11-16 ENCOUNTER — Telehealth: Payer: Self-pay | Admitting: *Deleted

## 2022-11-16 VITALS — BP 138/75 | HR 70 | Ht 65.0 in | Wt 171.0 lb

## 2022-11-16 DIAGNOSIS — G4734 Idiopathic sleep related nonobstructive alveolar hypoventilation: Secondary | ICD-10-CM

## 2022-11-16 DIAGNOSIS — J439 Emphysema, unspecified: Secondary | ICD-10-CM | POA: Diagnosis not present

## 2022-11-16 DIAGNOSIS — K8689 Other specified diseases of pancreas: Secondary | ICD-10-CM

## 2022-11-16 MED ORDER — ALBUTEROL SULFATE HFA 108 (90 BASE) MCG/ACT IN AERS: 2.0000 | INHALATION_SPRAY | RESPIRATORY_TRACT | 2 refills | Status: DC | PRN

## 2022-11-16 NOTE — Telephone Encounter (Signed)
-----   Message from Olga Millers sent at 11/15/2022  3:00 PM EDT ----- Please arrange consultation with gastroenterology for abnormal MRI Olga Millers

## 2022-11-16 NOTE — Telephone Encounter (Signed)
pt aware of results  Referral placed 

## 2022-11-16 NOTE — Progress Notes (Signed)
Carolyn [name] - 72 y.o. female MRN [id number]  Date of birth: Mar 25, 1951  Subjective Chief Complaint  Patient presents with   Results    HPI Carolyn [name] is a 72 year old female here today for follow-up.  She had a sleep study done recently which showed nocturnal desaturations to a nadir of 84%.  Supplemental oxygen was added per protocol for oxygen saturation of 88% and below.  After 1 L/min of oxygen was applied her O2 saturations rose to 90 to 92%.  Did not have any significant obstructive sleep apnea on her study.  She does continue to smoke.  She is not interested in quitting at this time.  She did have COPD exacerbation about a month ago symptoms have improved at this time.  She remains on Breo daily with albuterol as needed.  ROS:  A comprehensive ROS was completed and negative except as noted per HPI  Allergies  Allergen Reactions   Trazodone And Nefazodone Shortness Of Breath   Azithromycin Rash    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   right shoulder blade   Neuromuscular disorder (HCC)    Restless Leg Syndrome   Pneumonia 2019   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   left nare   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   left upper shin   SCC (squamous cell carcinoma) in situ 03/19/2016   right pinky   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   right  thigh, left upper shin   SCC (squamous cell carcinoma) well diff 12/13/2008   left hand   SCC (squamous cell carcinoma) well diff x2 12/25/2015   left and right forearm   Thyroid disease     Past Surgical History:  Procedure Laterality Date   ANKLE SURGERY Right 2000   repaired tendons with pin placed   AORTIC VALVE REPLACEMENT N/A 06/16/2022   Procedure: AORTIC VALVE REPLACEMENT (AVR) USING EDWARDS INSPIRIS SIZE ;  Surgeon: Alleen Borne, MD;  Location: [facility] OR;  Service: Open Heart Surgery;  Laterality: N/A;   CERVICAL CONIZATION W/BX     COLONOSCOPY  2014   EYE SURGERY Bilateral    Cataracts   MELANOMA EXCISION  08/2014   back   RIGHT HEART CATH AND CORONARY ANGIOGRAPHY N/A 06/02/2022   Procedure: RIGHT HEART CATH AND CORONARY ANGIOGRAPHY;  Surgeon: Tonny Bollman, MD;  Location: [facility] INVASIVE CV LAB;  Service: Cardiovascular;  Laterality: N/A;   SHOULDER SURGERY Right    x2   SHOULDER SURGERY Left    TEE WITHOUT CARDIOVERSION N/A 06/16/2022   Procedure: TRANSESOPHAGEAL ECHOCARDIOGRAM;  Surgeon: Alleen Borne, MD;  Location[facility];  Service: Open Heart Surgery;  Laterality: N/A;   THUMB ARTHROSCOPY Right    Removed bone in thumb  Social History   Socioeconomic History   Marital status: Divorced    Spouse name: Not on file   Number of children: 2   Years of education: GED   Highest education level: GED or equivalent  Occupational History   Occupation: disabled  Tobacco Use   Smoking status: Every Day    Current packs/day: 0.50    Average packs/day: 0.5 packs/day for 54.6 years (27.3 ttl pk-yrs)    Types: Cigarettes    Start date: 04/06/1968    Passive exposure: Never   Smokeless tobacco: Never  Vaping Use   Vaping status: Never Used  Substance and Sexual Activity   Alcohol use: No   Drug use: No   Sexual activity: Not Currently    Partners: Male    Birth control/protection: None  Other Topics Concern   Not on file  Social History Narrative   Lives  alone. She had two children, one is deceased. Her son lives in [place]. She has a sister who lives close by who helps her a lot. Her enjoys gardening.    Social Determinants of Health   Financial Resource Strain: Low Risk  (07/02/2022)   Overall Financial Resource Strain (CARDIA)    Difficulty of Paying Living Expenses: Not hard at all  Food Insecurity: No Food Insecurity (07/02/2022)   Hunger Vital Sign    Worried About Running Out of Food in the Last Year: Never true    Ran Out of Food in the Last Year: Never true  Transportation Needs: No Transportation Needs (07/02/2022)   PRAPARE - Administrator, Civil Service (Medical): No    Lack of Transportation (Non-Medical): No  Physical Activity: Inactive (07/02/2022)   Exercise Vital Sign    Days of Exercise per Week: 3 days    Minutes of Exercise per Session: 0 min  Stress: No Stress Concern Present (07/02/2022)   [employer] of Occupational Health - Occupational Stress Questionnaire    Feeling of Stress : Not at all  Social Connections: Moderately Integrated (07/02/2022)   Social Connection and Isolation Panel [NHANES]    Frequency of Communication with Friends and Family: More than three times a week    Frequency of Social Gatherings with Friends and Family: Once a week    Attends Religious Services: 1 to 4 times per year    Active Member of [employer] or Organizations: Yes    Attends Banker Meetings: Never    Marital Status: Divorced    Family History  Problem Relation Age of Onset   Heart disease Mother    Aneurysm Mother    Heart disease Father    Kidney failure Father    Cancer Sister    COPD Sister    Cancer Brother    Cirrhosis Daughter    Cancer Sister        Bronchial    Health Maintenance  Topic Date Due   INFLUENZA VACCINE  11/05/2022   COVID-19 Vaccine (6 - 2023-24 season) 01/01/2023 (Originally 12/05/2021)   Lung Cancer Screening  06/04/2023   Medicare Annual Wellness (AWV)   07/02/2023   MAMMOGRAM  01/23/2024   Colonoscopy  09/10/2025   DTaP/Tdap/Td (3 - Td or Tdap) 01/04/2031   Pneumonia Vaccine 26+ Years old  Completed   DEXA SCAN  Completed   Hepatitis C Screening  Completed   Zoster Vaccines- Shingrix  Completed   HPV VACCINES  Aged Out     ----------------------------------------------------------------------------------------------------------------------------------------------------------------------------------------------------------------- Physical Exam BP 138/75 (BP Location: Left  Arm, Patient Position: Sitting, Cuff Size: Large)   Pulse 70   Ht 5\' 5"  (1.651 m)   Wt 171 lb (77.6 kg)   SpO2 99%   BMI 28.46 kg/m   Physical Exam Constitutional:      Appearance: Normal appearance.  HENT:     Head: Normocephalic and atraumatic.  Eyes:     General: No scleral icterus. Cardiovascular:     Rate and Rhythm: Normal rate and regular rhythm.  Musculoskeletal:     Cervical back: Neck supple.  Neurological:     Mental Status: She is alert.  Psychiatric:        Mood and Affect: Mood normal.        Behavior: Behavior normal.     ------------------------------------------------------------------------------------------------------------------------------------------------------------------------------------------------------------------- Assessment and Plan  COPD (chronic obstructive pulmonary disease) (HCC) She will continue Breo at current strength.  Albuterol as needed.  Encourage smoking cessation.  Nocturnal oxygen desaturation Noted to have nocturnal hypoxia on recent sleep study.  Sending prescription for home oxygen use at night at 1 to 2 L/min.   Meds ordered this encounter  Medications   albuterol (VENTOLIN HFA) 108 (90 Base) MCG/ACT inhaler    Sig: Inhale 2 puffs into the lungs every 4 (four) hours as needed for wheezing or shortness of breath.    Dispense:  8.5 each    Refill:  2    No follow-ups on file.    This  visit occurred during the SARS-CoV-2 public health emergency.  Safety protocols were in place, including screening questions prior to the visit, additional usage of staff PPE, and extensive cleaning of exam room while observing appropriate contact time as indicated for disinfecting solutions.

## 2022-11-17 ENCOUNTER — Ambulatory Visit: Payer: No Typology Code available for payment source | Admitting: Family Medicine

## 2022-11-19 ENCOUNTER — Other Ambulatory Visit: Payer: Self-pay | Admitting: Family Medicine

## 2022-11-19 ENCOUNTER — Telehealth: Payer: Self-pay

## 2022-11-19 MED ORDER — AMBULATORY NON FORMULARY MEDICATION: 0 refills | Status: DC

## 2022-11-19 NOTE — Telephone Encounter (Signed)
Faxed O2 Rx to [facility] with Insurance, Demo sheet, and sleep study interpretation.

## 2022-11-21 DIAGNOSIS — G4734 Idiopathic sleep related nonobstructive alveolar hypoventilation: Secondary | ICD-10-CM | POA: Insufficient documentation

## 2022-11-21 DIAGNOSIS — J449 Chronic obstructive pulmonary disease, unspecified: Secondary | ICD-10-CM | POA: Insufficient documentation

## 2022-11-21 NOTE — Assessment & Plan Note (Signed)
Noted to have nocturnal hypoxia on recent sleep study.  Sending prescription for home oxygen use at night at 1 to 2 L/min.

## 2022-11-21 NOTE — Assessment & Plan Note (Signed)
She will continue Breo at current strength.  Albuterol as needed.  Encourage smoking cessation.

## 2022-11-23 ENCOUNTER — Telehealth: Payer: Self-pay | Admitting: Family Medicine

## 2022-11-23 ENCOUNTER — Telehealth: Payer: Self-pay | Admitting: Gastroenterology

## 2022-11-23 NOTE — Telephone Encounter (Signed)
Good morning Dr. Tomasa Rand  The following patient is being referred to [place] for Dilated pancreatic duct. She had a colonoscopy with [facility] and wishes to start care with [place] because it is her doctor's recommendation. Records are available on Epic. Please review and advise of scheduling.

## 2022-11-23 NOTE — Telephone Encounter (Signed)
[facility] called in on patient's behalf requesting O2 rx be sent:  [facility] Fax: [phone]

## 2022-11-24 ENCOUNTER — Other Ambulatory Visit: Payer: Self-pay

## 2022-11-24 MED ORDER — AMBULATORY NON FORMULARY MEDICATION: 0 refills | Status: AC

## 2022-11-25 ENCOUNTER — Other Ambulatory Visit: Payer: Self-pay | Admitting: Family Medicine

## 2022-11-25 MED ORDER — AMBULATORY NON FORMULARY MEDICATION: 0 refills | Status: AC

## 2022-11-25 NOTE — Telephone Encounter (Signed)
Faxed signed order to [facility].

## 2022-11-30 ENCOUNTER — Telehealth: Payer: Self-pay | Admitting: Family Medicine

## 2022-11-30 NOTE — Telephone Encounter (Signed)
Patient called in regards how many liters she needs to be on please advise please forward to [facility]

## 2022-11-30 NOTE — Telephone Encounter (Signed)
I called patient and she states we need to call [facility].   [facility] Phone - [phone]   [facility] states they have contracted [employer] to supply the oxygen concentrator.   [employer] phone - [phone]  Fax [phone]

## 2022-12-02 ENCOUNTER — Telehealth: Payer: Self-pay | Admitting: *Deleted

## 2022-12-02 NOTE — Telephone Encounter (Signed)
The patient has been notified of the result and verbalized understanding.  All questions (if any) were answered. Patient states she had referral to a GI. She states the Gi office was suppose to contact her back because  she had seen a Gi in [place]  In 2017. RN informed patient will contact office to see where the process of the referral . 024 5:13 PM

## 2022-12-02 NOTE — Telephone Encounter (Signed)
RN reviewed patient's chart  Patient received a MyChart message on 11/23/22 Good morning [name],   Our office received a referral from Dr. Olga Millers, M.D. to schedule an appointment. If you will give our office a call at your convenience to discuss scheduling at [phone] option 1   Thank you, [facility]    RN contact patient gave her message and phone number to call an set up appointment. She verbalized understanding.

## 2022-12-02 NOTE — Telephone Encounter (Signed)
-----   Message from Olga Millers sent at 11/15/2022  3:00 PM EDT ----- Please arrange consultation with gastroenterology for abnormal MRI Olga Millers

## 2022-12-03 ENCOUNTER — Telehealth: Payer: Self-pay | Admitting: Family Medicine

## 2022-12-03 ENCOUNTER — Encounter: Payer: Self-pay | Admitting: Gastroenterology

## 2022-12-03 NOTE — Telephone Encounter (Signed)
Misty Stanley with [employer] called.   Needs face to face notes for Order received for Nocturnal oxygen. Telephone contact: [phone] Fax Number: [phone]

## 2022-12-09 DIAGNOSIS — N182 Chronic kidney disease, stage 2 (mild): Secondary | ICD-10-CM | POA: Diagnosis not present

## 2022-12-09 DIAGNOSIS — E663 Overweight: Secondary | ICD-10-CM | POA: Diagnosis not present

## 2022-12-09 DIAGNOSIS — Z008 Encounter for other general examination: Secondary | ICD-10-CM | POA: Diagnosis not present

## 2022-12-09 DIAGNOSIS — R7303 Prediabetes: Secondary | ICD-10-CM | POA: Diagnosis not present

## 2022-12-09 DIAGNOSIS — Z6828 Body mass index (BMI) 28.0-28.9, adult: Secondary | ICD-10-CM | POA: Diagnosis not present

## 2022-12-22 ENCOUNTER — Other Ambulatory Visit: Payer: Self-pay | Admitting: Family Medicine

## 2023-01-27 ENCOUNTER — Ambulatory Visit: Payer: No Typology Code available for payment source

## 2023-01-27 DIAGNOSIS — Z1231 Encounter for screening mammogram for malignant neoplasm of breast: Secondary | ICD-10-CM | POA: Diagnosis not present

## 2023-02-06 ENCOUNTER — Other Ambulatory Visit: Payer: Self-pay | Admitting: Family Medicine

## 2023-02-06 DIAGNOSIS — J309 Allergic rhinitis, unspecified: Secondary | ICD-10-CM

## 2023-02-24 ENCOUNTER — Encounter: Payer: Self-pay | Admitting: Gastroenterology

## 2023-02-24 ENCOUNTER — Ambulatory Visit: Payer: No Typology Code available for payment source | Admitting: Gastroenterology

## 2023-02-24 ENCOUNTER — Other Ambulatory Visit (INDEPENDENT_AMBULATORY_CARE_PROVIDER_SITE_OTHER): Payer: No Typology Code available for payment source

## 2023-02-24 VITALS — BP 124/80 | HR 64 | Wt 171.0 lb

## 2023-02-24 DIAGNOSIS — J439 Emphysema, unspecified: Secondary | ICD-10-CM | POA: Diagnosis not present

## 2023-02-24 DIAGNOSIS — K8689 Other specified diseases of pancreas: Secondary | ICD-10-CM | POA: Diagnosis not present

## 2023-02-24 DIAGNOSIS — K5900 Constipation, unspecified: Secondary | ICD-10-CM | POA: Diagnosis not present

## 2023-02-24 DIAGNOSIS — Z952 Presence of prosthetic heart valve: Secondary | ICD-10-CM | POA: Diagnosis not present

## 2023-02-24 LAB — COMPREHENSIVE METABOLIC PANEL WITH GFR
ALT: 20 U/L (ref 0–35)
AST: 26 U/L (ref 0–37)
Albumin: 4.3 g/dL (ref 3.5–5.2)
Alkaline Phosphatase: 82 U/L (ref 39–117)
BUN: 15 mg/dL (ref 6–23)
CO2: 30 meq/L (ref 19–32)
Calcium: 9.7 mg/dL (ref 8.4–10.5)
Chloride: 100 meq/L (ref 96–112)
Creatinine, Ser: 0.85 mg/dL (ref 0.40–1.20)
GFR: 69.57 mL/min
Glucose, Bld: 99 mg/dL (ref 70–99)
Potassium: 4.2 meq/L (ref 3.5–5.1)
Sodium: 135 meq/L (ref 135–145)
Total Bilirubin: 0.5 mg/dL (ref 0.2–1.2)
Total Protein: 7 g/dL (ref 6.0–8.3)

## 2023-02-24 LAB — LIPASE: Lipase: 17 U/L (ref 11.0–59.0)

## 2023-02-24 NOTE — Progress Notes (Signed)
Discussed the use of AI scribe software for clinical note transcription with the patient, who gave verbal consent to proceed.  HPI : Carolyn [name] is a 71 y.o. female with a history of aortic stenosis status post AVR, chronic active tobacco use, COPD with nocturnal oxygen who is referred to [place] by Everrett Coombe, DO for further evaluation of incidentally noted dilated pancreatic duct.  She was initially found to have an abnormal appearing pancreas and pancreatic duct on a CT scan in February of this year.  Follow-up MRI was recommended which was done in August of this year.  The MRCP showed a diffusely dilated main pancreatic duct of 6 mm and cystic changes of the pancreas.  No solid lesions or mass lesions are seen.  The patient denies any history of pancreatitis or family history of pancreatic cancer. She reports no concerning abdominal symptoms such as pain, nausea, vomiting, decreased appetite, weight loss, or diarrhea. However, she does experience chronic constipation, requiring the use of Milk of Magnesia twice weekly for bowel regulation.  She has tried taking numerous prescription and other over-the-counter laxatives, but has found that this regimen works well for her and she prefers to just continue to take this for constipation.  In addition to the pancreatic duct dilation, the patient has a history of severe aortic stenosis, for which she underwent aortic valve replacement in March of this year.  Post-surgery, she reports feeling significantly better, although she still experiences fatigue. She also has a diagnosis of COPD and uses supplemental oxygen at night due to significant oxygen desaturation during sleep.  The patient underwent a colonoscopy five years ago, which was unremarkable, and she is not due for another until 2027. She also reports issues with urinary incontinence, but this was not further explored during the consultation.   MRCP Nov 13, 2022 IMPRESSION: 1. Diffusely  dilated pancreatic duct, measuring up to 0.6 cm in caliber, with diffuse cystic transformation of the pancreas, particularly of the body and tail. No solid component or suspicious contrast enhancement. Findings are most consistent with sequelae of prior pancreatitis and/or main duct IPMN. No acute inflammatory findings. Consider EUS/FNA for tissue diagnosis. 2. Layering sludge in the gallbladder. No discrete gallstones, gallbladder wall thickening, or biliary dilatation.   Aortic Atherosclerosis (ICD10-I70.0).   CT Angio Abdomen/Pelvis Jun 03, 2022 IMPRESSION: 1. Vascular findings and measurements pertinent to potential TAVR procedure, as detailed above. 2. Severe thickening and calcification of the aortic valve, compatible with reported clinical history of severe aortic stenosis. 3. Diffuse dilatation of the main pancreatic duct with hypovascular areas throughout the body and tail of the pancreas. This is poorly evaluated on today's arterial phase examination, but favored to represent diffuse side branch ectasia along with multiple cystic lesions throughout the body and tail of the pancreas, however underlying hypovascular mass or masses in these regions is not excluded. Follow-up nonemergent outpatient abdominal MRI with and without IV gadolinium with MRCP is recommended in the near future to better evaluate these findings and exclude underlying neoplasm. 4. Small pulmonary nodules measuring 4 mm or less in size, nonspecific, but statistically likely benign. No follow-up needed if patient is low-risk (and has no known or suspected primary neoplasm). Non-contrast chest CT can be considered in 12 months if patient is high-risk. This recommendation follows the consensus statement: Guidelines for Management of Incidental Pulmonary Nodules Detected on CT Images: From the Fleischner Society 2017; Radiology 2017; 284:228-243. 5. Multiple calcified left-sided pleural plaques, presumably  sequela of prior left  pleural infection or hemorrhage. 6. Additional incidental findings, as above.   Colonoscopy 2017 (Dr. Lucretia Roers, [facility]) Hypertrophied anal papilla, otherwise normal colonoscopy Recommended repeat 10 years  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   right shoulder blade   Neuromuscular disorder (HCC)    Restless Leg Syndrome   Pneumonia 2019   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   left nare   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   left upper shin   SCC (squamous cell carcinoma) in situ 03/19/2016   right pinky   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   right thigh, left upper shin   SCC (squamous cell carcinoma) well diff 12/13/2008   left hand   SCC (squamous cell carcinoma) well diff x2 12/25/2015   left and right forearm   Thyroid disease      Past Surgical History:  Procedure Laterality Date   ANKLE SURGERY Right 2000   repaired tendons with pin placed   AORTIC VALVE REPLACEMENT N/A 06/16/2022   Procedure: AORTIC VALVE REPLACEMENT (AVR) USING EDWARDS INSPIRIS SIZE ;  Surgeon: Alleen Borne, MD;  Location: [facility] OR;  Service: Open Heart Surgery;  Laterality: N/A;   CERVICAL CONIZATION W/BX     COLONOSCOPY  2014   EYE SURGERY Bilateral    Cataracts   MELANOMA EXCISION  08/2014   back   RIGHT HEART CATH AND CORONARY  ANGIOGRAPHY N/A 06/02/2022   Procedure: RIGHT HEART CATH AND CORONARY ANGIOGRAPHY;  Surgeon: Tonny Bollman, MD;  Location: [facility] INVASIVE CV LAB;  Service: Cardiovascular;  Laterality: N/A;   SHOULDER SURGERY Right    x2   SHOULDER SURGERY Left    TEE WITHOUT CARDIOVERSION N/A 06/16/2022   Procedure: TRANSESOPHAGEAL ECHOCARDIOGRAM;  Surgeon: Alleen Borne, MD;  Location: [facility] OR;  Service: Open Heart Surgery;  Laterality: N/A;   THUMB ARTHROSCOPY Right    Removed bone in thumb   Family History  Problem Relation Age of Onset   Heart disease Mother    Aneurysm Mother    Heart disease Father    Kidney failure Father    Cancer Sister    COPD Sister    Cancer Brother    Cirrhosis Daughter    Cancer Sister        Bronchial   Social History   Tobacco Use   Smoking status: Every Day    Current packs/day: 0.50    Average packs/day: 0.5 packs/day for 54.9 years (27.4 ttl pk-yrs)    Types: Cigarettes    Start date: 04/06/1968    Passive exposure: Never   Smokeless tobacco: Never  Vaping Use   Vaping status: Never Used  Substance Use Topics   Alcohol use: No   Drug use: No   Current  Outpatient Medications  Medication Sig Dispense Refill   albuterol (VENTOLIN HFA) 108 (90 Base) MCG/ACT inhaler TAKE 2 PUFFS BY MOUTH EVERY 6 HOURS AS NEEDED FOR WHEEZE OR SHORTNESS OF BREATH 8.5 each 2   AMBULATORY NON FORMULARY MEDICATION Home health:  Physical therapy-Evaluate and treat, Occupational therapy-Evaluate and treat, Nursing-Teaching on heart healthy diet, medications, 1 Units 0   AMBULATORY NON FORMULARY MEDICATION Please provide home O2 to be use at bedtime.  Flow 2L/Min. 1 Units 0   ascorbic acid (VITAMIN C) 500 MG tablet Take 500 mg by mouth daily.     aspirin 81 MG EC tablet TAKE 1 TABLET BY MOUTH EVERY DAY (Patient taking differently: Take 81 mg by mouth daily.) 90 tablet 3   atorvastatin (LIPITOR) 10 MG tablet TAKE 1 TABLET BY MOUTH EVERY DAY 90 tablet 3   baclofen (LIORESAL) 10 MG  tablet TAKE 1 TABLET BY MOUTH THREE TIMES A DAY AS NEEDED FOR MUSCLE SPASMS 270 tablet 1   BREO ELLIPTA 100-25 MCG/ACT AEPB INHALE 1 PUFF BY MOUTH EVERY DAY 180 each 3   Calcium Carb-Cholecalciferol (CALCIUM 600 + D PO) Take 1 tablet by mouth 2 (two) times daily.     Cholecalciferol (DIALYVITE VITAMIN D 5000) 125 MCG (5000 UT) capsule Take 5,000 Units by mouth daily.     clonazePAM (KLONOPIN) 0.5 MG tablet TAKE 1 TABLET BY MOUTH EVERYDAY AT BEDTIME 30 tablet 3   fluticasone (FLONASE) 50 MCG/ACT nasal spray SPRAY 1 SPRAY INTO EACH NOSTRIL EVERY DAY 48 mL 1   Garlic 1000 MG CAPS Take 1,000 mg by mouth daily.     levofloxacin (LEVAQUIN) 500 MG tablet Take 1 tablet (500 mg total) by mouth daily. 7 tablet 0   levothyroxine (SYNTHROID) 100 MCG tablet TAKE 1 TABLET BY MOUTH EVERY DAY BEFORE BREAKFAST (Patient not taking: Reported on 02/24/2023) 90 tablet 1   magnesium hydroxide (MILK OF MAGNESIA) 400 MG/5ML suspension Take 15 mLs by mouth daily as needed for mild constipation.     Multiple Vitamins-Minerals (CENTRUM SILVER ADULT 50+ PO) Take 1 tablet by mouth daily.     Omega-3 Fatty Acids (FISH OIL) 1200 MG CAPS Take 1,200 mg by mouth daily.     pregabalin (LYRICA) 75 MG capsule Take 1 capsule (75 mg total) by mouth at bedtime. 30 capsule 5   rOPINIRole (REQUIP) 3 MG tablet TAKE 1 TABLET BY MOUTH EVERYDAY AT BEDTIME 90 tablet 1   zinc gluconate 50 MG tablet Take 50 mg by mouth daily.     No current facility-administered medications for this visit.   Allergies  Allergen Reactions   Trazodone And Nefazodone Shortness Of Breath   Azithromycin Rash     Review of Systems: All systems reviewed and negative except where noted in HPI.    MM 3D SCREENING MAMMOGRAM BILATERAL BREAST  Result Date: 01/29/2023 CLINICAL DATA:  Screening. EXAM: DIGITAL SCREENING BILATERAL MAMMOGRAM WITH TOMOSYNTHESIS AND CAD TECHNIQUE: Bilateral screening digital craniocaudal and mediolateral oblique mammograms were  obtained. Bilateral screening digital breast tomosynthesis was performed. The images were evaluated with computer-aided detection. COMPARISON:  Previous exam(s). ACR Breast Density Category b: There are scattered areas of fibroglandular density. FINDINGS: There are no findings suspicious for malignancy. IMPRESSION: No mammographic evidence of malignancy. A result letter of this screening mammogram will be mailed directly to the patient. RECOMMENDATION: Screening mammogram in one year. (Code:[id number]-B-01Y) BI-RADS CATEGORY  1: Negative. Electronically Signed   By: Frederico Hamman M.D.   On: 01/29/2023 09:15  Physical Exam: BP 124/80   Pulse 64   Wt 171 lb (77.6 kg)   BMI 28.46 kg/m  Constitutional: Pleasant,well-developed, Caucasian female in no acute distress. HEENT: Normocephalic and atraumatic. Conjunctivae are normal. No scleral icterus. Neck supple.  Cardiovascular: Normal rate, regular rhythm.  Soft systolic murmur Pulmonary/chest: Effort normal with coarse breath sounds, no wheezing.  No rales. Abdominal: Soft, nondistended, nontender. Bowel sounds active throughout. There are no masses palpable. No hepatomegaly. Extremities: no edema Neurological: Alert and oriented to person place and time. Skin: Skin is warm and dry. No rashes noted.  Superficial bruising on upper extremities Psychiatric: Normal mood and affect. Behavior is normal.  CBC    Component Value Date/Time   WBC 10.1 07/01/2022 1630   RBC 4.05 07/01/2022 1630   HGB 11.9 07/01/2022 1630   HGB 14.7 10/14/2018 1114   HCT 36.6 07/01/2022 1630   HCT 43.0 10/14/2018 1114   PLT 403 (H) 07/01/2022 1630   PLT 212 10/14/2018 1114   MCV 90.4 07/01/2022 1630   MCV 89 10/14/2018 1114   MCH 29.4 07/01/2022 1630   MCHC 32.5 07/01/2022 1630   RDW 13.4 07/01/2022 1630   RDW 12.8 10/14/2018 1114   LYMPHSABS 1,495 07/01/2022 1630   LYMPHSABS 1.4 10/14/2018 1114   MONOABS 0.6 10/28/2006 1354   EOSABS 253 07/01/2022 1630   EOSABS  0.0 10/14/2018 1114   BASOSABS 111 07/01/2022 1630   BASOSABS 0.1 10/14/2018 1114    CMP     Component Value Date/Time   NA 137 07/01/2022 1630   NA 131 (L) 10/14/2018 1114   K 4.7 07/01/2022 1630   CL 97 (L) 07/01/2022 1630   CO2 28 07/01/2022 1630   GLUCOSE 120 (H) 07/01/2022 1630   BUN 25 07/01/2022 1630   BUN 11 10/14/2018 1114   CREATININE 1.00 07/01/2022 1630   CALCIUM 9.6 07/01/2022 1630   PROT 7.2 07/01/2022 1630   PROT 6.8 10/14/2018 1114   ALBUMIN 3.5 06/12/2022 1430   ALBUMIN 4.6 10/14/2018 1114   AST 21 07/01/2022 1630   ALT 14 07/01/2022 1630   ALKPHOS 67 06/12/2022 1430   BILITOT 0.4 07/01/2022 1630   BILITOT 0.5 10/14/2018 1114   GFRNONAA >60 06/23/2022 0101   GFRAA 88 10/14/2018 1114       Latest Ref Rng & Units 07/01/2022    4:30 PM 06/22/2022    4:45 AM 06/21/2022    3:42 AM  CBC EXTENDED  WBC 3.8 - 10.8 Thousand/uL 10.1  8.1  7.4   RBC 3.80 - 5.10 Million/uL 4.05  2.95  2.93   Hemoglobin 11.7 - 15.5 g/dL 16.1  8.8  8.6   HCT 09.6 - 45.0 % 36.6  26.3  25.9   Platelets 140 - 400 Thousand/uL 403  190  187   NEUT# 1,500 - 7,800 cells/uL 7,615     Lymph# 850 - 3,900 cells/uL 1,495         ASSESSMENT AND PLAN:  72 year old female with longstanding tobacco use, COPD and aortic valve stenosis status post AVR, with incidentally noted diffuse pancreatic duct dilation without solid mass lesion, 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  No family history of pancreatic cancer, no personal history of pancreatitis.  Dilated Pancreatic Duct Dilated pancreatic duct identified on CT and confirmed by MRI, measuring approximately 6 mm. Differential diagnosis includes obstruction due to a mass or a main duct IPMN.  An EUS would be reasonable to further exclude mass lesion and confirm suspicion of main duct  IPMN.   Discussed EUS procedure details, including sedation, scope insertion, and potential biopsy.  Discussed how the findings may warrant further surveillance or possible  recommendations for surgery. - Endoscopic ultrasound (EUS) with Dr. Meridee Score, procedure will be scheduled pending his review and agreement to proceed. - Order lipase, CA19, and CMP    Constipation Chronic constipation managed with milk of magnesia twice a week. Previous medications were unsuccessful. No current symptoms of abdominal pain, nausea, vomiting, or diarrhea. Discussed potential benefits of gentler daily medications like MiraLAX or Metamucil. - Continue current regimen of milk of magnesia as needed  COPD Chronic obstructive pulmonary disease managed with nighttime oxygen therapy due to nocturnal desaturation. Reports fatigue but no acute exacerbations. - Continue nighttime oxygen therapy  Severe Aortic Stenosis (post valve replacement) Underwent aortic valve replacement in March 2024 for severe aortic stenosis. Reports significant improvement in symptoms but still experiences some fatigue. No current concerns from the cardiologist. - Continue follow-up with cardiologist as needed  Colon cancer screening Colonoscopy 2017 normal  - Schedule next colonoscopy in 2027  Carolyn [name] E. Tomasa Rand, MD [facility]  I spent a total of 40 minutes reviewing the patient's medical record, interviewing and examining the patient, discussing her diagnosis and management of her condition going forward, and documenting in the medical record     Everrett Coombe, DO

## 2023-02-24 NOTE — Patient Instructions (Signed)
Your provider has requested that you go to the basement level for lab work before leaving today. Press "B" on the elevator. The lab is located at the first door on the left as you exit the elevator.  _______________________________________________________  If your blood pressure at your visit was 140/90 or greater, please contact your primary care physician to follow up on this.  _______________________________________________________  If you are age 70 or older, your body mass index should be between 23-30. Your Body mass index is 28.46 kg/m. If this is out of the aforementioned range listed, please consider follow up with your Primary Care Provider.  If you are age 53 or younger, your body mass index should be between 19-25. Your Body mass index is 28.46 kg/m. If this is out of the aformentioned range listed, please consider follow up with your Primary Care Provider.   ________________________________________________________  The [facility] providers would like to encourage you to use [facility] to communicate with providers for non-urgent requests or questions.  Due to long hold times on the telephone, sending your provider a message by [facility] may be a faster and more efficient way to get a response.  Please allow 48 business hours for a response.  Please remember that this is for non-urgent requests.    It was a pleasure to see you today!  Thank you for trusting me with your gastrointestinal care!    Jenel Lucks, MD

## 2023-02-25 LAB — CA 19-9 (SERIAL): CA 19-9: 22 U/mL (ref 0–35)

## 2023-03-01 ENCOUNTER — Other Ambulatory Visit: Payer: Self-pay | Admitting: Family Medicine

## 2023-03-01 ENCOUNTER — Telehealth: Payer: Self-pay

## 2023-03-01 ENCOUNTER — Other Ambulatory Visit: Payer: Self-pay | Admitting: Podiatry

## 2023-03-01 DIAGNOSIS — I700 Atherosclerosis of aorta: Secondary | ICD-10-CM

## 2023-03-01 DIAGNOSIS — M5412 Radiculopathy, cervical region: Secondary | ICD-10-CM

## 2023-03-01 DIAGNOSIS — Z87410 Personal history of cervical dysplasia: Secondary | ICD-10-CM

## 2023-03-01 NOTE — Telephone Encounter (Signed)
-----   Message from [facility] sent at 02/24/2023  5:20 PM EST ----- Regarding: RE: nonurgent EUS SEC, We can certainly perform an endoscopic ultrasound. At her age with the significant cystic change that is apparent, I think the reason for [place] to perform an EUS is that if we found that she had progressive dysplasia would she be a candidate for potential total pancreatectomy. It would be worth considering a CA 19-9 be drawn. If elevated this increases concern overall. Would talk with patient about whether a referral to a hepatobiliary surgeon ([place]) would be okay with her so that they have the opportunity to talk with her about what long-term management may be for her. I am scheduling February procedures at this point so if the CA 19-9 is normal I think that that is reasonable but if the CA 19-9 is elevated, she may need to be referred elsewhere unless I have a cancellation sooner. Once we know what the patient is willing to do, reply back to [place] and then we can work on scheduling. Thanks. GM ----- Message ----- From: Jenel Lucks, MD Sent: 02/24/2023   2:15 PM EST To: Loretha Stapler, RN; Lemar Lofty., MD Subject: nonurgent EUS                                  GM,  Saw this patient in clinic today.  Think she probably needs an EUS.  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  Found on CT in Feb 2024, confirmed with MRCP in Aug 2024.  No symptoms.  No hx of pancreatitis.  Long standing smoker, still smoking.  Has COPD/nocturnal oxygen.  Had aortic valve replaced in March, doing well.  Told her she would hear from [place] about getting EUS scheduled, and that it will probably be a few months.  Let me know what you think  Thanks SEC

## 2023-03-01 NOTE — Telephone Encounter (Signed)
Mansouraty, Netty Starring., MD  Jenel Lucks, MD; Loretha Stapler, RN SEC, I think a nonurgent referral to surgery makes sense, if she wants to wait that is fine but for someone who may have a main duct IPMN it makes sense for them to have an understanding of what surgery could involve because that is what we are really trying to find out when we do our EUS We can see where things stand at the time of the EUS as well, so we will leave that up to you and the patient. When Cythina Mickelsen returns she will be working on trying to get her set up for the EUS.  Ronan Dion, Please offer this patient next available EUS slot. Thanks. GM

## 2023-03-01 NOTE — Telephone Encounter (Signed)
Inbound call from patient stating she spoke with her insurance and stated it is okay to set up an appointment. Patient is requesting a call back to discuss further. Please advise, thank you.

## 2023-03-01 NOTE — Telephone Encounter (Signed)
See alternate note dated 11/25

## 2023-03-01 NOTE — Progress Notes (Signed)
Carolyn [name], All your labs look great.  The tumor marker for pancreas cancer was not elevated.  Your liver enzymes and pancreatic enzyme levels were normal.  As we discussed on the phone, it is recommended that you meet with a surgeon to discuss what a possible pancreatic surgery might entail, should you need it and to discuss whether you would be a possible candidate.  We will place a referral to [facility] so that you can meet them and have that discussion.  You should be hearing back from our office about getting an appointment for an endoscopic ultrasound, although it may be 2 to 3 months.  [name], Can you please place a referral to [facility] surgery (Dr. Donell Beers or other pancreatic surgeon)?  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

## 2023-03-09 ENCOUNTER — Other Ambulatory Visit: Payer: Self-pay

## 2023-03-09 NOTE — Telephone Encounter (Signed)
Copied from CRM [id number]. Topic: Clinical - Medication Refill >> Mar 08, 2023  8:12 AM Amy B wrote: Most Recent Primary Care Visit:  Provider: Everrett Coombe  Department: [facility]  Visit Type: OFFICE VISIT  Date: 11/16/2022  Medication: pregabalin (LYRICA) 75 MG capsule; clonazePAM (KLONOPIN) 0.5 MG tablet  Has the patient contacted their pharmacy? No (Agent: If no, request that the patient contact the pharmacy for the refill. If patient does not wish to contact the pharmacy document the reason why and proceed with request.) (Agent: If yes, when and what did the pharmacy advise?)  Is this the correct pharmacy for this prescription? Yes If no, delete pharmacy and type the correct one.  This is the patient's preferred pharmacy:  [id number] [address], [place] - [address] [place] [address] Phone: [phone] Fax: [phone]   Has the prescription been filled recently? No  Is the patient out of the medication? Yes  Has the patient been seen for an appointment in the last year OR does the patient have an upcoming appointment? Yes  Can we respond through MyChart? Yes  Agent: Please be advised that Rx refills may take up to 3 business days. We ask that you follow-up with your pharmacy.

## 2023-03-09 NOTE — Telephone Encounter (Signed)
Patient is requesting a refill for clonazepam and pregabalin.   [facility], refill for clon[facility]epam is available for pick up. Patient is aware.   Rx for pregabalin pended for refill.

## 2023-03-10 MED ORDER — PREGABALIN 75 MG PO CAPS: 75.0000 mg | ORAL_CAPSULE | Freq: Every day | ORAL | 5 refills | Status: DC

## 2023-03-11 ENCOUNTER — Ambulatory Visit
Admission: EM | Admit: 2023-03-11 | Discharge: 2023-03-11 | Disposition: A | Discharge status: Home / Self Care | Payer: No Typology Code available for payment source | Attending: Family Medicine | Admitting: Family Medicine

## 2023-03-11 DIAGNOSIS — J440 Chronic obstructive pulmonary disease with acute lower respiratory infection: Secondary | ICD-10-CM

## 2023-03-11 DIAGNOSIS — J209 Acute bronchitis, unspecified: Secondary | ICD-10-CM | POA: Diagnosis not present

## 2023-03-11 MED ORDER — DOXYCYCLINE HYCLATE 100 MG PO CAPS: ORAL_CAPSULE | ORAL | 0 refills | Status: DC

## 2023-03-11 MED ORDER — PREDNISONE 20 MG PO TABS: ORAL_TABLET | ORAL | 0 refills | Status: DC

## 2023-03-11 MED ORDER — METHYLPREDNISOLONE SODIUM SUCC 125 MG IJ SOLR
125.0000 mg | Freq: Once | INTRAMUSCULAR | Status: AC
  Administered 2023-03-11: 125 mg via INTRAMUSCULAR

## 2023-03-11 NOTE — ED Triage Notes (Addendum)
Pt c/o nasal/chest congestion as well as cough x 1 week. Hx of bronchitis and pnuemonia. Taking benedryl and tylenol prn.

## 2023-03-11 NOTE — ED Provider Notes (Signed)
[facility]    CSN: [id number] Arrival date & time: 03/11/23  0801      History   Chief Complaint Chief Complaint  Patient presents with   Nasal Congestion    HPI Carolyn [name] is a 72 y.o. female.   One week ago patient suddenly developed nasal drainage and post-nasal drainage that has persisted.  Concurrently she developed a mild sore throat and a partly productive cough but does not believe that she has had fever. She uses O2 at night and reports shortness of breath with activity during the daytime. Her albuterol inhaler has been helpful during the past two days.  She denies pleuritic pain.  She has COPD with history of bronchitis and pneumonia.  The history is provided by the patient.    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   right shoulder blade   Neuromuscular disorder (HCC)    Restless Leg Syndrome   Pneumonia 2019   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   left nare   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   left upper shin   SCC (squamous cell carcinoma) in situ 03/19/2016   right pinky   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   right thigh, left upper shin   SCC (squamous cell carcinoma) well diff 12/13/2008   left hand   SCC (squamous cell carcinoma) well diff x2 12/25/2015   left and right forearm   Thyroid disease      Patient Active Problem List   Diagnosis Date Noted   COPD (chronic obstructive pulmonary disease) (HCC) 11/21/2022   Nocturnal hypoxia 11/21/2022   S/P AVR 07/01/2022   S/P aortic valve replacement 06/16/2022   Severe aortic stenosis 06/01/2022   Unstable angina (HCC) 06/01/2022   Plantar fasciitis, right 05/21/2022   Nocturnal oxygen desaturation 05/18/2022   Cervical radiculitis 11/16/2021   Hearing loss due to cerumen impaction, right 06/23/2021   Insomnia 04/08/2021   Tobacco abuse 02/10/2017   Peripheral neuropathic pain 08/04/2016   Pulmonary nodule 07/15/2016   Constipation 04/13/2016   Aortic atherosclerosis (HCC) 01/13/2016   COPD exacerbation (HCC) 01/13/2016   Depression 07/11/2015   GAD (generalized anxiety disorder) 07/11/2015   Allergic rhinitis 07/11/2015   Hypothyroidism 07/11/2015   Restless leg syndrome 07/11/2015   Atypical glandular cells of undetermined significance (AGUS) on cervical Pap smear 11/06/2013   Postmenopausal state 10/09/2013   History of cervical dysplasia 10/09/2013    Past Surgical History:  Procedure Laterality Date   ANKLE SURGERY Right 2000   repaired tendons with pin placed   AORTIC VALVE REPLACEMENT N/A 06/16/2022   Procedure: AORTIC VALVE REPLACEMENT (AVR) USING EDWARDS INSPIRIS SIZE ;  Surgeon: Alleen Borne, MD;  Location: [facility] OR;  Service: Open Heart Surgery;  Laterality: N/A;   CERVICAL CONIZATION W/BX     COLONOSCOPY  2014   EYE SURGERY Bilateral    Cataracts   MELANOMA EXCISION  08/2014   back   RIGHT HEART CATH AND CORONARY ANGIOGRAPHY N/A 06/02/2022   Procedure: RIGHT HEART CATH AND CORONARY ANGIOGRAPHY;  Surgeon: Tonny Bollman, MD;  Location: [facility] INVASIVE CV LAB;  Service: Cardiovascular;  Laterality: N/A;   SHOULDER SURGERY Right    x2   SHOULDER SURGERY Left    TEE WITHOUT CARDIOVERSION N/A 06/16/2022   Procedure: TRANSESOPHAGEAL ECHOCARDIOGRAM;  Surgeon: Alleen Borne, MD;  Location: [facility] OR;  Service:  Open Heart Surgery;  Laterality: N/A;   THUMB ARTHROSCOPY Right    Removed bone in thumb    OB History     Gravida  3   Para  2   Term  2   Preterm  0   AB  1   Living  2      SAB  1   IAB      Ectopic      Multiple      Live Births  2            Home Medications    Prior to Admission medications   Medication Sig Start Date End Date Taking? Authorizing Provider  doxycycline (VIBRAMYCIN) 100 MG capsule Take one cap PO Q12hr with food. 03/11/23  Yes Lattie Haw, MD  predniSONE (DELTASONE) 20 MG tablet Take one tab by mouth twice daily for 4 days, then one daily for 3 days. Take with food. 03/11/23  Yes Lattie Haw, MD  albuterol (VENTOLIN HFA) 108 (90 Base) MCG/ACT inhaler TAKE 2 PUFFS BY MOUTH EVERY 6 HOURS AS NEEDED FOR WHEEZE OR SHORTNESS OF BREATH 12/23/22   Everrett Coombe, DO  AMBULATORY NON FORMULARY MEDICATION Home health:  Physical therapy-Evaluate and treat, Occupational therapy-Evaluate and treat, Nursing-Teaching on heart healthy diet, medications, 11/24/22   Everrett Coombe, DO  AMBULATORY NON FORMULARY MEDICATION Please provide home O2 to be use at bedtime.  Flow 2L/Min. 11/25/22   Everrett Coombe, DO  ascorbic acid (VITAMIN C) 500 MG tablet Take 500 mg by mouth daily.    [provider]  aspirin 81 MG EC tablet TAKE 1 TABLET BY MOUTH EVERY DAY Patient taking differently: Take 81 mg by mouth daily. 11/11/18   Remus Loffler, PA-C  atorvastatin (LIPITOR) 10 MG tablet TAKE 1 TABLET BY MOUTH EVERY DAY 03/01/23   Everrett Coombe, DO  baclofen (LIORESAL) 10 MG tablet TAKE 1 TABLET BY MOUTH THREE TIMES A DAY AS NEEDED FOR MUSCLE SPASMS 09/30/22   Everrett Coombe, DO  BREO ELLIPTA 100-25 MCG/ACT AEPB INHALE 1 PUFF BY MOUTH EVERY DAY 02/08/23   Everrett Coombe, DO  Calcium Carb-Cholecalciferol (CALCIUM 600 + D PO) Take 1 tablet by mouth 2 (two) times daily.    [provider]  Cholecalciferol (DIALYVITE VITAMIN D 5000) 125 MCG (5000 UT) capsule  Take 5,000 Units by mouth daily.    [provider]  clonazePAM (KLONOPIN) 0.5 MG tablet TAKE 1 TABLET BY MOUTH EVERYDAY AT BEDTIME 03/01/23   Everrett Coombe, DO  fluticasone [facility]) 50 MCG/ACT nasal spray SPRAY 1 SPRAY INTO EACH NOSTRIL EVERY DAY 02/08/23   Everrett Coombe, DO  Garlic 1000 MG CAPS Take 1,000 mg by mouth daily.    [provider]  levofloxacin (LEVAQUIN) 500 MG tablet Take 1 tablet (500 mg total) by mouth daily. 07/28/22   Eustace Moore, MD  levothyroxine (SYNTHROID) 100 MCG tablet TAKE 1 TABLET BY MOUTH EVERY DAY BEFORE BREAKFAST Patient not taking: Reported on 02/24/2023 09/30/22   Everrett Coombe, DO  magnesium hydroxide (MILK OF MAGNESIA) 400 MG/5ML suspension Take 15 mLs by mouth daily as needed for mild constipation.    [provider]  meloxicam (MOBIC) 15 MG tablet TAKE 1 TABLET (15 MG TOTAL) BY MOUTH DAILY. 03/01/23   Louann Sjogren, DPM  Multiple Vitamins-Minerals (CENTRUM SILVER ADULT 50+ PO) Take 1 tablet by mouth daily.    [provider]  Omega-3 Fatty Acids (FISH OIL) 1200 MG CAPS Take 1,200 mg by mouth daily.    [provider]  pregabalin (LYRICA) 75 MG capsule Take 1 capsule (75 mg total) by mouth at bedtime. 03/10/23   Everrett Coombe, DO  rOPINIRole (REQUIP) 3 MG tablet TAKE 1 TABLET BY MOUTH EVERYDAY AT BEDTIME 09/30/22   Everrett Coombe, DO  zinc gluconate 50 MG tablet Take 50 mg by mouth daily.    [provider]    Family History Family History  Problem Relation Age of Onset   Heart disease Mother    Aneurysm Mother    Heart disease Father    Kidney failure Father    Cancer Sister    COPD Sister    Cancer Brother    Cirrhosis Daughter    Cancer Sister        Bronchial    Social History Social History   Tobacco Use   Smoking status: Every Day    Current packs/day: 0.50    Average packs/day: 0.5 packs/day for 54.9 years (27.5 ttl pk-yrs)    Types: Cigarettes    Start date: 04/06/1968     Passive exposure: Never   Smokeless tobacco: Never  Vaping Use   Vaping status: Never Used  Substance Use Topics   Alcohol use: No   Drug use: No     Allergies   Trazodone and nefazodone and Azithromycin   Review of Systems Review of Systems + sore throat + cough No pleuritic pain + wheezing + nasal congestion + post-nasal drainage ? sinus pain/pressure No itchy/red eyes No earache No hemoptysis + SOB with activity No fever/chills No nausea No vomiting No abdominal pain No diarrhea No urinary symptoms No skin rash + fatigue No myalgias No headache Used OTC meds (Benadryl) without relief   Physical Exam Triage Vital Signs ED Triage Vitals  Encounter Vitals Group     BP 03/11/23 0806 (!) 145/84     Systolic BP Percentile --      Diastolic BP Percentile --      Pulse Rate 03/11/23 0806 89     Resp 03/11/23 0806 18     Temp 03/11/23 0806 97.9 F (36.6 C)     Temp Source 03/11/23 0806 Oral     SpO2 03/11/23 0806 92 %     Weight --      Height --      Head Circumference --      Peak Flow --      Pain Score 03/11/23 0810 0     Pain Loc --      Pain Education --      Exclude from Growth Chart --    No data found.  Updated Vital Signs BP (!) 145/84 (BP Location: Right Arm)   Pulse 89   Temp 97.9 F (36.6 C) (Oral)   Resp 18   SpO2 92%   Visual Acuity Right Eye Distance:  Left Eye Distance:   Bilateral Distance:    Right Eye Near:   Left Eye Near:    Bilateral Near:     Physical Exam Nursing notes and Vital Signs reviewed. Appearance:  Patient appears stated age, and in no acute distress Eyes:  Pupils are equal, round, and reactive to light and accomodation.  Extraocular movement is intact.  Conjunctivae are not inflamed  Ears:  Right canal partly occluded with cerumen. Tympanic membranes normal.  Nose:  Congested turbinates.  No sinus tenderness.   Pharynx:  Normal Neck:  Supple.  No adenopathy. Lungs:  With forced exhalation, diffuse  rhonchi and wheezes all fields.  Breath sounds are equal.   Heart:  Regular rate and rhythm without murmurs, rubs, or gallops.  Abdomen:  Nontender without masses or hepatosplenomegaly.  Bowel sounds are present.  No CVA or flank tenderness.  Extremities:  No edema.  Skin:  No rash present.   UC Treatments / Results  Labs (all labs ordered are listed, but only abnormal results are displayed) Labs Reviewed - No data to display  EKG   Radiology No results found.  Procedures Procedures (including critical care time)  Medications Ordered in UC Medications  methylPREDNISolone sodium succinate (SOLU-MEDROL) 125 mg/2 mL injection 125 mg (125 mg Intramuscular Given 03/11/23 0839)    Initial Impression / Assessment and Plan / UC Course  I have reviewed the triage vital signs and the nursing notes.  Pertinent labs & imaging results that were available during my care of the patient were reviewed by me and considered in my medical decision making (see chart for details).    Administered Solumedrol 125mg  IM, then begin prednisone burst/taper. Begin doxycycline 100mg  Q12hr for one week. Followup with Family Doctor if not improved in one week.    Final Clinical Impressions(s) / UC Diagnoses   Final diagnoses:  COPD with acute bronchitis [facility])     Discharge Instructions      Begin prednisone Friday 03/12/23. Take plain guaifenesin (1200mg  extended release tabs such as Mucinex) twice daily, with plenty of water, for cough and congestion.   Get adequate rest.   Use albuterol inhaler as prescribed. May take Delsym Cough Suppressant ("12 Hour Cough Relief") at bedtime for nighttime cough.  For nasal congestion may use Afrin nasal spray (or generic oxymetazoline) each morning for about 5 days and then discontinue.  Also recommend using saline nasal spray several times daily and saline nasal irrigation (AYR is a common brand).  Use Flonase nasal spray each morning after using Afrin nasal  spray and saline nasal irrigation.  Stop all antihistamines (Benedryl, etc) for now, and other non-prescription cough/cold preparations.   If symptoms become significantly worse during the night or over the weekend, proceed to the local emergency room.     ED Prescriptions     Medication Sig Dispense Auth. Provider   predniSONE (DELTASONE) 20 MG tablet Take one tab by mouth twice daily for 4 days, then one daily for 3 days. Take with food. 11 tablet Lattie Haw, MD   doxycycline (VIBRAMYCIN) 100 MG capsule Take one cap PO Q12hr with food. 14 capsule Lattie Haw, MD         Lattie Haw, MD 03/11/23 [phone]

## 2023-03-11 NOTE — Discharge Instructions (Addendum)
Begin prednisone Friday 03/12/23. Take plain guaifenesin (1200mg  extended release tabs such as Mucinex) twice daily, with plenty of water, for cough and congestion.   Get adequate rest.   Use albuterol inhaler as prescribed. May take Delsym Cough Suppressant ("12 Hour Cough Relief") at bedtime for nighttime cough.  For nasal congestion may use Afrin nasal spray (or generic oxymetazoline) each morning for about 5 days and then discontinue.  Also recommend using saline nasal spray several times daily and saline nasal irrigation (AYR is a common brand).  Use Flonase nasal spray each morning after using Afrin nasal spray and saline nasal irrigation.  Stop all antihistamines (Benedryl, etc) for now, and other non-prescription cough/cold preparations.   If symptoms become significantly worse during the night or over the weekend, proceed to the local emergency room.

## 2023-03-24 DIAGNOSIS — L814 Other melanin hyperpigmentation: Secondary | ICD-10-CM | POA: Diagnosis not present

## 2023-03-24 DIAGNOSIS — Z85820 Personal history of malignant melanoma of skin: Secondary | ICD-10-CM | POA: Diagnosis not present

## 2023-03-24 DIAGNOSIS — C44729 Squamous cell carcinoma of skin of left lower limb, including hip: Secondary | ICD-10-CM | POA: Diagnosis not present

## 2023-03-24 DIAGNOSIS — R233 Spontaneous ecchymoses: Secondary | ICD-10-CM | POA: Diagnosis not present

## 2023-03-24 DIAGNOSIS — C44612 Basal cell carcinoma of skin of right upper limb, including shoulder: Secondary | ICD-10-CM | POA: Diagnosis not present

## 2023-03-24 DIAGNOSIS — L820 Inflamed seborrheic keratosis: Secondary | ICD-10-CM | POA: Diagnosis not present

## 2023-03-24 DIAGNOSIS — L720 Epidermal cyst: Secondary | ICD-10-CM | POA: Diagnosis not present

## 2023-03-24 DIAGNOSIS — L570 Actinic keratosis: Secondary | ICD-10-CM | POA: Diagnosis not present

## 2023-03-26 DIAGNOSIS — D490 Neoplasm of unspecified behavior of digestive system: Secondary | ICD-10-CM | POA: Diagnosis not present

## 2023-03-26 DIAGNOSIS — J438 Other emphysema: Secondary | ICD-10-CM | POA: Diagnosis not present

## 2023-04-01 ENCOUNTER — Other Ambulatory Visit: Payer: Self-pay | Admitting: Family Medicine

## 2023-04-01 DIAGNOSIS — Z87410 Personal history of cervical dysplasia: Secondary | ICD-10-CM

## 2023-04-01 DIAGNOSIS — M5412 Radiculopathy, cervical region: Secondary | ICD-10-CM

## 2023-04-01 DIAGNOSIS — E039 Hypothyroidism, unspecified: Secondary | ICD-10-CM

## 2023-04-06 DIAGNOSIS — C44612 Basal cell carcinoma of skin of right upper limb, including shoulder: Secondary | ICD-10-CM | POA: Diagnosis not present

## 2023-04-06 DIAGNOSIS — C44719 Basal cell carcinoma of skin of left lower limb, including hip: Secondary | ICD-10-CM | POA: Diagnosis not present

## 2023-04-06 HISTORY — PX: MALIGNANT SKIN LESION EXCISION: SHX159

## 2023-04-08 ENCOUNTER — Telehealth: Payer: Self-pay

## 2023-04-08 NOTE — Telephone Encounter (Signed)
-----   Message from [facility] sent at 03/27/2023  9:08 AM EST ----- Regarding: RE: IPMN lady. FB, Thanks for seeing patient this is helpful to know.  Imaad Reuss, Please work on reaching patient and offering her EUS in the next few months. Thanks. GM ----- Message ----- From: Aron Shoulders, MD Sent: 03/26/2023   9:53 AM EST To: Larraine LITTIE Ellen; Odetta LITTIE Curly, RN; # Subject: IPMN lady.                                     This lady is nice, but unusual. Not sure if she would need total panc or just distal panc.    She said, I'm never having that kind of surgery.  I did talk her into EUS to have a good baseline to evaluate for small masses especially since the MRI had so much motion artifact.  She is thinking about not doing anything after that including not getting surveillance imaging unless she is having issues.  So let me know if I need to see her back.    FB

## 2023-04-12 ENCOUNTER — Other Ambulatory Visit: Payer: Self-pay

## 2023-04-12 DIAGNOSIS — K8689 Other specified diseases of pancreas: Secondary | ICD-10-CM

## 2023-04-12 NOTE — Telephone Encounter (Signed)
 EUS has been set up for 06/28/23 at 1115 am at [facility] with GM   Left message on machine to call back

## 2023-04-13 NOTE — Telephone Encounter (Signed)
 EUS scheduled, pt instructed and medications reviewed.  Patient instructions mailed to home.  Patient to call with any questions or concerns.

## 2023-04-13 NOTE — Telephone Encounter (Signed)
 PT would like to have instructions mailed to her. Please advise.

## 2023-04-20 ENCOUNTER — Telehealth: Payer: Self-pay | Admitting: Cardiology

## 2023-04-20 MED ORDER — AMLODIPINE BESYLATE 5 MG PO TABS: 5.0000 mg | ORAL_TABLET | Freq: Every day | ORAL | 3 refills | Status: DC

## 2023-04-20 NOTE — Telephone Encounter (Signed)
 Pt c/o BP issue: STAT if pt c/o blurred vision, one-sided weakness or slurred speech  1. What are your last 5 BP readings?  1/14: 186/88  2. Are you having any other symptoms (ex. Dizziness, headache, blurred vision, passed out)?  No   3. What is your BP issue?   Patient states BP has been elevated. She mentions that she has been taking sinus medication and she assumes it may be contributing to the high BP.

## 2023-04-20 NOTE — Telephone Encounter (Signed)
Spoke with pt, Aware of dr crenshaw's recommendations. New script sent to the pharmacy  

## 2023-04-20 NOTE — Telephone Encounter (Signed)
 Spoke with pt, she does not know how long it has been elevated because she really has not been taking it. Yesterday it was 186/88 and 158/84 while on the phone with me. She is getting anxious because the bottom number keeps elevating. She took 3 aspirin  this morning because she was scared. She is watching her sodium and reports taking mucinex  severe sinus cold medication. She is having some bad pain from an incision on her leg that is red and draining, she has an appointment with that doctor this afternoon. Aware the pain would elevate the blood pressure some. She has a follow up appointment 05/12/23 but wants dr pietro tro know what her blood pressure is doing. Aware he is not in the office but will forward for his review.

## 2023-04-21 DIAGNOSIS — Z6828 Body mass index (BMI) 28.0-28.9, adult: Secondary | ICD-10-CM | POA: Diagnosis not present

## 2023-04-21 DIAGNOSIS — E039 Hypothyroidism, unspecified: Secondary | ICD-10-CM | POA: Diagnosis not present

## 2023-04-21 DIAGNOSIS — K8689 Other specified diseases of pancreas: Secondary | ICD-10-CM | POA: Diagnosis not present

## 2023-04-21 DIAGNOSIS — J449 Chronic obstructive pulmonary disease, unspecified: Secondary | ICD-10-CM | POA: Diagnosis not present

## 2023-04-21 DIAGNOSIS — I509 Heart failure, unspecified: Secondary | ICD-10-CM | POA: Diagnosis not present

## 2023-04-21 DIAGNOSIS — F3342 Major depressive disorder, recurrent, in full remission: Secondary | ICD-10-CM | POA: Diagnosis not present

## 2023-04-21 DIAGNOSIS — E785 Hyperlipidemia, unspecified: Secondary | ICD-10-CM | POA: Diagnosis not present

## 2023-04-21 DIAGNOSIS — E663 Overweight: Secondary | ICD-10-CM | POA: Diagnosis not present

## 2023-04-21 DIAGNOSIS — Z008 Encounter for other general examination: Secondary | ICD-10-CM | POA: Diagnosis not present

## 2023-04-21 DIAGNOSIS — I110 Hypertensive heart disease with heart failure: Secondary | ICD-10-CM | POA: Diagnosis not present

## 2023-04-21 DIAGNOSIS — I2510 Atherosclerotic heart disease of native coronary artery without angina pectoris: Secondary | ICD-10-CM | POA: Diagnosis not present

## 2023-04-30 NOTE — Progress Notes (Deleted)
 HPI: Follow-up aortic stenosis/aortic valve replacement. Chest CT October 2019 showed aortic atherosclerosis without aneurysm formation. Echocardiogram September 2023 showed normal LV function, grade 1 diastolic dysfunction, moderate left atrial enlargement, moderate to severe aortic stenosis with mean gradient 39 mmHg, peak velocity 4.09 m/s, dimensionless index 0.37, aortic valve area 1.17 cm), mild aortic insufficiency, PFO noted. Abdominal ultrasound 1/24 showed no aneurysm. 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  March 2024 patient underwent aortic valve replacement with an Edwards Inspiris 21 mm bovine pericardial tissue valve.  Postoperative course complicated by atrial fibrillation treated with amiodarone .  Follow-up echocardiogram April 2024 showed normal LV function, mild left ventricular hypertrophy, grade 1 diastolic dysfunction, mild biatrial enlargement, status post aortic valve replacement with mean gradient 12 mmHg and no aortic insufficiency.  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  Patient was subsequently referred to gastroenterology.  Since last seen   Current Outpatient Medications  Medication Sig Dispense Refill   albuterol  (VENTOLIN  HFA) 108 (90 Base) MCG/ACT inhaler TAKE 2 PUFFS BY MOUTH EVERY 6 HOURS AS NEEDED FOR WHEEZE OR SHORTNESS OF BREATH 8.5 each 2   AMBULATORY NON FORMULARY MEDICATION Home health:  Physical therapy-Evaluate and treat, Occupational therapy-Evaluate and treat, Nursing-Teaching on heart healthy diet, medications, 1 Units 0   AMBULATORY NON FORMULARY MEDICATION Please provide home O2 to be use at bedtime.  Flow 2L/Min. 1 Units 0   amLODipine  (NORVASC ) 5 MG tablet Take 1  tablet (5 mg total) by mouth daily. 90 tablet 3   ascorbic acid (VITAMIN C) 500 MG tablet Take 500 mg by mouth daily.     aspirin  81 MG EC tablet TAKE 1 TABLET BY MOUTH EVERY DAY (Patient taking differently: Take 81 mg by mouth daily.) 90 tablet 3   atorvastatin  (LIPITOR) 10 MG tablet TAKE 1 TABLET BY MOUTH EVERY DAY 90 tablet 3   baclofen  (LIORESAL ) 10 MG tablet TAKE 1 TABLET BY MOUTH THREE TIMES A DAY AS NEEDED FOR MUSCLE SPASM 270 tablet 0   BREO ELLIPTA  100-25 MCG/ACT AEPB INHALE 1 PUFF BY MOUTH EVERY DAY 180 each 3   Calcium  Carb-Cholecalciferol (CALCIUM  600 + D PO) Take 1 tablet by mouth 2 (two) times daily.     Cholecalciferol (DIALYVITE VITAMIN D 5000) 125 MCG (5000 UT) capsule Take 5,000 Units by mouth daily.     clonazePAM  (KLONOPIN ) 0.5 MG tablet TAKE 1 TABLET BY MOUTH EVERYDAY AT BEDTIME 30 tablet 3   doxycycline  (VIBRAMYCIN ) 100 MG capsule Take one cap PO Q12hr with food. 14 capsule 0   fluticasone  (FLONASE ) 50 MCG/ACT nasal spray SPRAY 1 SPRAY INTO EACH NOSTRIL EVERY DAY 48 mL 1   Garlic 1000 MG CAPS Take 1,000 mg by mouth daily.     levofloxacin  (LEVAQUIN ) 500 MG tablet Take 1 tablet (500 mg total) by mouth daily. 7 tablet 0   levothyroxine  (SYNTHROID ) 100 MCG tablet TAKE 1 TABLET BY MOUTH EVERY DAY BEFORE BREAKFAST 90 tablet 0   magnesium  hydroxide (MILK OF MAGNESIA) 400 MG/5ML suspension Take 15 mLs by mouth daily as needed for mild constipation.     meloxicam  (MOBIC ) 15 MG tablet TAKE 1 TABLET (15 MG TOTAL) BY MOUTH DAILY. 30 tablet 0   Multiple Vitamins-Minerals (CENTRUM SILVER ADULT 50+ PO) Take 1  tablet by mouth daily.     Omega-3 Fatty Acids (FISH OIL) 1200 MG CAPS Take 1,200 mg by mouth daily.     predniSONE  (DELTASONE ) 20 MG tablet Take one tab by mouth twice daily for 4 days, then one daily for 3 days. Take with food. 11 tablet 0   pregabalin  (LYRICA ) 75 MG capsule Take 1 capsule (75 mg total) by mouth at bedtime. 30 capsule 5   rOPINIRole  (REQUIP ) 3 MG tablet TAKE 1  TABLET BY MOUTH EVERYDAY AT BEDTIME 90 tablet 1   zinc gluconate 50 MG tablet Take 50 mg by mouth daily.     No current facility-administered medications for this visit.     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   right shoulder blade   Neuromuscular disorder (HCC)    Restless Leg Syndrome   Pneumonia 2019   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   left nare   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   left upper shin   SCC (squamous cell carcinoma) in situ 03/19/2016   right pinky   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   right thigh, left upper shin   SCC (squamous cell carcinoma) well diff 12/13/2008   left hand   SCC (squamous cell carcinoma) well diff x2 12/25/2015   left and right forearm   Thyroid  disease     Past Surgical History:  Procedure Laterality Date   ANKLE SURGERY Right 2000   repaired tendons with pin placed   AORTIC VALVE REPLACEMENT N/A 06/16/2022   Procedure: AORTIC VALVE REPLACEMENT (AVR) USING EDWARDS INSPIRIS SIZE ;  Surgeon: Lucas Dorise POUR, MD;  Location: [facility] OR;  Service: Open Heart Surgery;  Laterality: N/A;   CERVICAL CONIZATION W/BX     COLONOSCOPY  2014   EYE SURGERY Bilateral    Cataracts   MELANOMA EXCISION  08/2014   back   RIGHT HEART CATH AND CORONARY ANGIOGRAPHY N/A 06/02/2022   Procedure: RIGHT HEART CATH AND  CORONARY ANGIOGRAPHY;  Surgeon: Wonda Sharper, MD;  Location: [facility] INVASIVE CV LAB;  Service: Cardiovascular;  Laterality: N/A;   SHOULDER SURGERY Right    x2   SHOULDER SURGERY Left    TEE WITHOUT CARDIOVERSION N/A 06/16/2022   Procedure: TRANSESOPHAGEAL ECHOCARDIOGRAM;  Surgeon: Lucas Dorise POUR, MD;  Location: [facility] OR;  Service: Open Heart Surgery;  Laterality: N/A;   THUMB ARTHROSCOPY Right    Removed bone in thumb    Social History   Socioeconomic History   Marital status: Divorced    Spouse name: Not on file   Number of children: 2   Years of education: GED   Highest education level: GED or equivalent  Occupational History   Occupation: disabled   Occupation: retired  Tobacco Use  Smoking status: Every Day    Current packs/day: 0.50    Average packs/day: 0.5 packs/day for 55.1 years (27.5 ttl pk-yrs)    Types: Cigarettes    Start date: 04/06/1968    Passive exposure: Never   Smokeless tobacco: Never  Vaping Use   Vaping status: Never Used  Substance and Sexual Activity   Alcohol use: No   Drug use: No   Sexual activity: Not Currently    Partners: Male    Birth control/protection: None  Other Topics Concern   Not on file  Social History Narrative   Lives alone. She had two children, one is deceased. Her son lives in [place]. She has a sister who lives close by who helps her a lot. Her enjoys gardening.    Social Drivers of Corporate Investment Banker Strain: Low Risk  (07/02/2022)   Overall Financial Resource Strain (CARDIA)    Difficulty of Paying Living Expenses: Not hard at all  Food Insecurity: No Food Insecurity (07/02/2022)   Hunger Vital Sign    Worried About Running Out of Food in the Last Year: Never true    Ran Out of Food in the Last Year: Never true  Transportation Needs: No Transportation Needs (07/02/2022)   PRAPARE - Administrator, Civil Service (Medical): No    Lack of Transportation (Non-Medical): No  Physical Activity: Inactive  (07/02/2022)   Exercise Vital Sign    Days of Exercise per Week: 3 days    Minutes of Exercise per Session: 0 min  Stress: No Stress Concern Present (07/02/2022)   [employer] of Occupational Health - Occupational Stress Questionnaire    Feeling of Stress : Not at all  Social Connections: Moderately Integrated (07/02/2022)   Social Connection and Isolation Panel [NHANES]    Frequency of Communication with Friends and Family: More than three times a week    Frequency of Social Gatherings with Friends and Family: Once a week    Attends Religious Services: 1 to 4 times per year    Active Member of [employer] or Organizations: Yes    Attends Banker Meetings: Never    Marital Status: Divorced  Catering Manager Violence: Not At Risk (06/22/2022)   Humiliation, Afraid, Rape, and Kick questionnaire    Fear of Current or Ex-Partner: No    Emotionally Abused: No    Physically Abused: No    Sexually Abused: No    Family History  Problem Relation Age of Onset   Heart disease Mother    Aneurysm Mother    Heart disease Father    Kidney failure Father    Cancer Sister    COPD Sister    Cancer Brother    Cirrhosis Daughter    Cancer Sister        Bronchial    ROS: no fevers or chills, productive cough, hemoptysis, dysphasia, odynophagia, melena, hematochezia, dysuria, hematuria, rash, seizure activity, orthopnea, PND, pedal edema, claudication. Remaining systems are negative.  Physical Exam: Well-developed well-nourished in no acute distress.  Skin is warm and dry.  HEENT is normal.  Neck is supple.  Chest is clear to auscultation with normal expansion.  Cardiovascular exam is regular rate and rhythm.  Abdominal exam nontender or distended. No masses palpated. Extremities show no edema. neuro grossly intact  ECG- personally reviewed  A/P  1 status post aortic valve replacement-continue SBE prophylaxis.  Most recent echocardiogram showed normally functioning  valve.  2 dilated pancreatic duct-follow-up surgery and gastroenterology.  3 history of pulmonary nodules-plan follow-up noncontrast chest CT.  4 hyperlipidemia-continue statin.  5 tobacco abuse-patient counseled on discontinuing.  6 nonobstructive coronary disease-continue statin.  Redell Shallow, MD

## 2023-05-11 ENCOUNTER — Other Ambulatory Visit: Payer: Self-pay | Admitting: Family Medicine

## 2023-05-11 DIAGNOSIS — G2581 Restless legs syndrome: Secondary | ICD-10-CM

## 2023-05-12 ENCOUNTER — Ambulatory Visit: Payer: No Typology Code available for payment source | Admitting: Cardiology

## 2023-05-19 ENCOUNTER — Encounter: Payer: Self-pay | Admitting: Family Medicine

## 2023-05-19 ENCOUNTER — Ambulatory Visit (INDEPENDENT_AMBULATORY_CARE_PROVIDER_SITE_OTHER): Payer: No Typology Code available for payment source | Admitting: Family Medicine

## 2023-05-19 VITALS — BP 118/58 | HR 77 | Ht 65.0 in | Wt 179.0 lb

## 2023-05-19 DIAGNOSIS — F411 Generalized anxiety disorder: Secondary | ICD-10-CM | POA: Diagnosis not present

## 2023-05-19 DIAGNOSIS — J439 Emphysema, unspecified: Secondary | ICD-10-CM | POA: Diagnosis not present

## 2023-05-19 DIAGNOSIS — K862 Cyst of pancreas: Secondary | ICD-10-CM

## 2023-05-19 DIAGNOSIS — R911 Solitary pulmonary nodule: Secondary | ICD-10-CM

## 2023-05-19 DIAGNOSIS — D509 Iron deficiency anemia, unspecified: Secondary | ICD-10-CM

## 2023-05-19 DIAGNOSIS — E039 Hypothyroidism, unspecified: Secondary | ICD-10-CM | POA: Diagnosis not present

## 2023-05-19 MED ORDER — FLUTICASONE FUROATE-VILANTEROL 100-25 MCG/ACT IN AEPB
1.0000 | INHALATION_SPRAY | Freq: Every day | RESPIRATORY_TRACT | 11 refills | Status: DC
Start: 2023-05-19 — End: 2023-06-25

## 2023-05-19 NOTE — Assessment & Plan Note (Signed)
Updated CT scan of the chest ordered.

## 2023-05-19 NOTE — Assessment & Plan Note (Signed)
She will continue Breo at current strength, generic ordered..  Albuterol as needed.  Encourage smoking cessation.

## 2023-05-19 NOTE — Assessment & Plan Note (Signed)
This remains well controlled with current dose of citalopram.  Continue current strength.

## 2023-05-19 NOTE — Assessment & Plan Note (Signed)
Feels good with current levothyroxine strength.  Update TSH.

## 2023-05-19 NOTE — Progress Notes (Signed)
Carolyn [name] - 73 y.o. female MRN [id number]  Date of birth: 1950/06/27  Subjective Chief Complaint  Patient presents with   Medical Management of Chronic Issues    HPI Carolyn [name] is a 74 y.o. female here today for follow-up visit.  She reports he is doing okay.  Continues on amlodipine for management of hypertension.  Blood pressure is well-controlled at this time.  She denies any side effects at this time.  She has not had chest pain, increased dyspnea, headaches or vision change.  RLS symptoms remain well-controlled with Requip at current strength as well as clonazepam as needed.  Reports that brand-name Virgel Bouquet is too expensive.  She is requesting a change to generic.  This does work well for her with albuterol as needed.  She does continue to smoke with no interest in quitting.  She does have history of pulmonary nodule and is due for updated CT scan of the chest.    She has upcoming endoscopic ultrasound pancreatic cyst.  ROS:  A comprehensive ROS was completed and negative except as noted per HPI   Allergies  Allergen Reactions   Trazodone And Nefazodone Shortness Of Breath   Azithromycin Rash    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   right shoulder blade   Neuromuscular disorder (HCC)    Restless Leg Syndrome   Pneumonia 2019   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    left nare   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   left upper shin   SCC (squamous cell carcinoma) in situ 03/19/2016   right pinky   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   right thigh, left upper shin   SCC (squamous cell carcinoma) well diff 12/13/2008   left hand   SCC (squamous cell carcinoma) well diff x2 12/25/2015   left and right forearm   Thyroid disease     Past Surgical History:  Procedure Laterality Date   ANKLE SURGERY Right 2000   repaired tendons with pin placed   AORTIC VALVE REPLACEMENT N/A 06/16/2022   Procedure: AORTIC VALVE REPLACEMENT (AVR) USING EDWARDS INSPIRIS SIZE ;  Surgeon: Alleen Borne, MD;  Location: [facility] OR;  Service: Open Heart Surgery;  Laterality: N/A;   CERVICAL CONIZATION W/BX     COLONOSCOPY  2014   EYE SURGERY Bilateral    Cataracts   MELANOMA EXCISION  08/2014   back   RIGHT HEART CATH AND CORONARY ANGIOGRAPHY N/A 06/02/2022   Procedure: RIGHT HEART CATH AND CORONARY ANGIOGRAPHY;  Surgeon: Tonny Bollman, MD;  Location[facility] INVASIVE CV LAB;  Service: Cardiovascular;  Laterality: N/A;   SHOULDER SURGERY Right    x2   SHOULDER SURGERY Left    TEE WITHOUT CARDIOVERSION N/A 06/16/2022   Procedure: TRANSESOPHAGEAL  ECHOCARDIOGRAM;  Surgeon: Alleen Borne, MD;  Location: [facility] OR;  Service: Open Heart Surgery;  Laterality: N/A;   THUMB ARTHROSCOPY Right    Removed bone in thumb    Social History   Socioeconomic History   Marital status: Divorced    Spouse name: Not on file   Number of children: 2   Years of education: GED   Highest education level: GED or equivalent  Occupational History   Occupation: disabled   Occupation: retired  Tobacco Use   Smoking status: Every Day    Current packs/day: 0.50    Average packs/day: 0.5 packs/day for 55.1 years (27.6 ttl pk-yrs)    Types: Cigarettes    Start date: 04/06/1968    Passive exposure: Never   Smokeless tobacco: Never  Vaping Use   Vaping status: Never  Used  Substance and Sexual Activity   Alcohol use: No   Drug use: No   Sexual activity: Not Currently    Partners: Male    Birth control/protection: None  Other Topics Concern   Not on file  Social History Narrative   Lives alone. She had two children, one is deceased. Her son lives in [place]. She has a sister who lives close by who helps her a lot. Her enjoys gardening.    Social Drivers of Health   Financial Resource Strain: Medium Risk (05/14/2023)   Overall Financial Resource Strain (CARDIA)    Difficulty of Paying Living Expenses: Somewhat hard  Food Insecurity: No Food Insecurity (05/14/2023)   Hunger Vital Sign    Worried About Running Out of Food in the Last Year: Never true    Ran Out of Food in the Last Year: Never true  Transportation Needs: No Transportation Needs (05/14/2023)   PRAPARE - Administrator, Civil Service (Medical): No    Lack of Transportation (Non-Medical): No  Physical Activity: Inactive (05/14/2023)   Exercise Vital Sign    Days of Exercise per Week: 0 days    Minutes of Exercise per Session: 0 min  Stress: No Stress Concern Present (05/14/2023)   [employer] of Occupational Health - Occupational Stress Questionnaire    Feeling of Stress : Not at all  Social Connections: Moderately Integrated (05/14/2023)   Social Connection and Isolation Panel [NHANES]    Frequency of Communication with Friends and Family: More than three times a week    Frequency of Social Gatherings with Friends and Family: Three times a week    Attends Religious Services: More than 4 times per year    Active Member of Clubs or Organizations: Yes    Attends Banker Meetings: Never    Marital Status: Divorced    Family History  Problem Relation Age of Onset   Heart disease Mother    Aneurysm Mother    Heart disease Father    Kidney failure Father    Cancer Sister    COPD Sister    Cancer Brother    Cirrhosis Daughter    Cancer Sister         Bronchial    Health Maintenance  Topic Date Due   Lung Cancer Screening  06/04/2023   Medicare Annual Wellness (AWV)  07/02/2023   COVID-19 Vaccine (6 - 2024-25 season) 06/04/2023 (Originally 12/06/2022)   MAMMOGRAM  01/26/2025   Colonoscopy  09/10/2025   DTaP/Tdap/Td (3 - Td or Tdap) 01/04/2031   Pneumonia Vaccine 75+ Years old  Completed   INFLUENZA VACCINE  Completed   DEXA  SCAN  Completed   Hepatitis C Screening  Completed   Zoster Vaccines- Shingrix  Completed   HPV VACCINES  Aged Out     ----------------------------------------------------------------------------------------------------------------------------------------------------------------------------------------------------------------- Physical Exam BP (!) 118/58 (BP Location: Left Arm, Patient Position: Sitting, Cuff Size: Normal)   Pulse 77   Ht 5\' 5"  (1.651 m)   Wt 179 lb (81.2 kg)   SpO2 97%   BMI 29.79 kg/m   Physical Exam Constitutional:      Appearance: Normal appearance.  HENT:     Head: Normocephalic and atraumatic.  Eyes:     General: No scleral icterus. Cardiovascular:     Rate and Rhythm: Normal rate and regular rhythm.  Pulmonary:     Effort: Pulmonary effort is normal.     Breath sounds: Normal breath sounds.  Musculoskeletal:     Cervical back: Neck supple.  Neurological:     Mental Status: She is alert.  Psychiatric:        Mood and Affect: Mood normal.        Behavior: Behavior normal.     ------------------------------------------------------------------------------------------------------------------------------------------------------------------------------------------------------------------- Assessment and Plan  Pulmonary nodule Updated CT scan of the chest ordered.  Hypothyroidism Feels good with current levothyroxine strength.  Update TSH.  GAD (generalized anxiety disorder) This remains well controlled with current dose of citalopram.  Continue current  strength.  COPD (chronic obstructive pulmonary disease) (HCC) She will continue Breo at current strength, generic ordered..  Albuterol as needed.  Encourage smoking cessation.   Meds ordered this encounter  Medications   fluticasone furoate-vilanterol (BREO ELLIPTA) 100-25 MCG/ACT AEPB    Sig: Inhale 1 puff into the lungs daily.    Dispense:  1 each    Refill:  11    No follow-ups on file.    This visit occurred during the SARS-CoV-2 public health emergency.  Safety protocols were in place, including screening questions prior to the visit, additional usage of staff PPE, and extensive cleaning of exam room while observing appropriate contact time as indicated for disinfecting solutions.

## 2023-05-20 ENCOUNTER — Ambulatory Visit: Payer: No Typology Code available for payment source

## 2023-05-20 DIAGNOSIS — R911 Solitary pulmonary nodule: Secondary | ICD-10-CM | POA: Diagnosis not present

## 2023-05-20 DIAGNOSIS — J929 Pleural plaque without asbestos: Secondary | ICD-10-CM | POA: Diagnosis not present

## 2023-05-20 DIAGNOSIS — J439 Emphysema, unspecified: Secondary | ICD-10-CM

## 2023-05-20 DIAGNOSIS — I700 Atherosclerosis of aorta: Secondary | ICD-10-CM

## 2023-05-20 LAB — CBC WITH DIFFERENTIAL/PLATELET
Basophils Absolute: 0.1 10*3/uL (ref 0.0–0.2)
Basos: 1 %
EOS (ABSOLUTE): 0.1 10*3/uL (ref 0.0–0.4)
Eos: 1 %
Hematocrit: 44.6 % (ref 34.0–46.6)
Hemoglobin: 14.9 g/dL (ref 11.1–15.9)
Immature Grans (Abs): 0 10*3/uL (ref 0.0–0.1)
Immature Granulocytes: 0 %
Lymphocytes Absolute: 1.2 10*3/uL (ref 0.7–3.1)
Lymphs: 14 %
MCH: 29.9 pg (ref 26.6–33.0)
MCHC: 33.4 g/dL (ref 31.5–35.7)
MCV: 89 fL (ref 79–97)
Monocytes Absolute: 0.5 10*3/uL (ref 0.1–0.9)
Monocytes: 6 %
Neutrophils Absolute: 6.7 10*3/uL (ref 1.4–7.0)
Neutrophils: 78 %
Platelets: 232 10*3/uL (ref 150–450)
RBC: 4.99 x10E6/uL (ref 3.77–5.28)
RDW: 12.8 % (ref 11.7–15.4)
WBC: 8.6 10*3/uL (ref 3.4–10.8)

## 2023-05-20 LAB — IRON,TIBC AND FERRITIN PANEL
Ferritin: 60 ng/mL (ref 15–150)
Iron Saturation: 18 % (ref 15–55)
Iron: 66 ug/dL (ref 27–139)
Total Iron Binding Capacity: 372 ug/dL (ref 250–450)
UIBC: 306 ug/dL (ref 118–369)

## 2023-05-20 LAB — CMP14+EGFR
ALT: 20 [IU]/L (ref 0–32)
AST: 28 [IU]/L (ref 0–40)
Albumin: 4.2 g/dL (ref 3.9–4.9)
Alkaline Phosphatase: 89 [IU]/L (ref 44–121)
BUN/Creatinine Ratio: 17 (ref 12–28)
BUN: 12 mg/dL (ref 8–27)
Bilirubin Total: 0.5 mg/dL (ref 0.0–1.2)
CO2: 20 mmol/L (ref 20–29)
Calcium: 9.8 mg/dL (ref 8.7–10.3)
Chloride: 100 mmol/L (ref 96–106)
Creatinine, Ser: 0.72 mg/dL (ref 0.57–1.00)
Globulin, Total: 2.2 g/dL (ref 1.5–4.5)
Glucose: 104 mg/dL — ABNORMAL HIGH (ref 70–99)
Potassium: 4.5 mmol/L (ref 3.5–5.2)
Sodium: 138 mmol/L (ref 134–144)
Total Protein: 6.4 g/dL (ref 6.0–8.5)
eGFR: 90 mL/min/{1.73_m2} (ref >59)

## 2023-05-20 LAB — TSH+FREE T4
Free T4: 1.45 ng/dL (ref 0.82–1.77)
TSH: 4.6 u[IU]/mL — ABNORMAL HIGH (ref 0.450–4.500)

## 2023-05-24 ENCOUNTER — Encounter: Payer: Self-pay | Admitting: Family Medicine

## 2023-05-24 ENCOUNTER — Other Ambulatory Visit: Payer: Self-pay | Admitting: Family Medicine

## 2023-05-24 ENCOUNTER — Telehealth: Payer: Self-pay

## 2023-05-24 DIAGNOSIS — E039 Hypothyroidism, unspecified: Secondary | ICD-10-CM

## 2023-05-24 MED ORDER — LEVOTHYROXINE SODIUM 112 MCG PO TABS: 112.0000 ug | ORAL_TABLET | Freq: Every day | ORAL | 0 refills | Status: DC

## 2023-05-24 NOTE — Telephone Encounter (Signed)
 Call transferred from [facility] agent. Patient is upset because she has not gotten a call regarding her latest lab results and is worried about the results. The recent results have not been reviewed yet by the provider.

## 2023-05-26 ENCOUNTER — Ambulatory Visit: Payer: No Typology Code available for payment source | Admitting: Obstetrics and Gynecology

## 2023-05-27 ENCOUNTER — Telehealth: Payer: Self-pay

## 2023-05-27 NOTE — Telephone Encounter (Signed)
 Copied from CRM [id number]. Topic: Clinical - Lab/Test Results >> May 24, 2023  9:44 AM Geroge Baseman wrote: Reason for CRM: Waiting on a call about lab results from dr Ashley Royalty. [phone] - has an appointment at 11AM. Please call after then if called today

## 2023-05-27 NOTE — Telephone Encounter (Signed)
 Attempted call to patient. Left a voice mail message requesting a return call to discuss lab results as requested.

## 2023-05-28 NOTE — Telephone Encounter (Signed)
 Spoke with patient regarding lab results.

## 2023-05-31 ENCOUNTER — Ambulatory Visit: Payer: No Typology Code available for payment source | Admitting: Obstetrics & Gynecology

## 2023-05-31 ENCOUNTER — Encounter: Payer: Self-pay | Admitting: Obstetrics & Gynecology

## 2023-05-31 VITALS — BP 131/81 | HR 74 | Wt 179.0 lb

## 2023-05-31 DIAGNOSIS — Z01419 Encounter for gynecological examination (general) (routine) without abnormal findings: Secondary | ICD-10-CM | POA: Diagnosis not present

## 2023-05-31 DIAGNOSIS — Z87410 Personal history of cervical dysplasia: Secondary | ICD-10-CM | POA: Diagnosis not present

## 2023-05-31 NOTE — Progress Notes (Signed)
 Patient presents for Annual.  LMP: Postmenopausal  Last pap: 04/29/22 Contraception: Post-menopausal Mammogram: Due, last mammogram: 01/27/23 STD Screening: not indicated Flu Vaccine :  11/24/22  CC:  Wants to know if she can do hormone replacement  .      Subjective:     Carolyn [name] is a 73 y.o. female here for a routine exam.  Current complaints: Having hair growth on her face & curious if HRT would help.     Gynecologic History No LMP recorded. Patient is postmenopausal. LMP: Postmenopausal  Last pap: 04/29/22 Contraception: Post-menopausal Mammogram: Due, last mammogram: 01/27/23 STD Screening: not indicated Flu Vaccine : 11/24/22 Colonoscopy: in 5 years   Obstetric History OB History  Gravida Para Term Preterm AB Living  3 2 2  0 1 2  SAB IAB Ectopic Multiple Live Births  1    2    # Outcome Date GA Lbr Len/2nd Weight Sex Type Anes PTL Lv  3 Term 11/16/83 [redacted]w[redacted]d         2 Term 01/25/76 [redacted]w[redacted]d         1 SAB              The following portions of the patient's history were reviewed and updated as appropriate: allergies, current medications, past family history, past medical history, past social history, past surgical history, and problem list.  Review of Systems Pertinent items noted in HPI and remainder of comprehensive ROS otherwise negative.    Objective:       Vitals:   05/31/23 1113  BP: 131/81  Pulse: 74  Weight: 179 lb (81.2 kg)   Vitals:  WNL General appearance: alert, cooperative and no distress  HEENT: Normocephalic, without obvious abnormality, atraumatic Eyes: negative Throat: lips, mucosa, and tongue normal; teeth and gums normal  Respiratory: Clear to auscultation bilaterally  CV: Regular rate and rhythm  Breasts:  Normal appearance, no masses or tenderness, no nipple retraction or dimpling     GU: External Genitalia:  Tanner V, no lesion Urethra:  No prolapse   Vagina: Pink, normal rugae, no blood or discharge  Cervix: No CMT, no  lesion, cervix VERY small and almost flush with top of vagina  Uterus:  Normal size and contour, non tender, limited by habitus  Adnexa: Normal, no masses, non tender, limited by habitus  Skin: Sun damage, well-healed scar from a basal removal, venous stasis,      Lymphatic: Axillary adenopathy: none     Psychiatric: Normal mood and behavior     Assessment:    Healthy female exam.    Plan:    1. Well woman exam (Primary)  -yearly mammograms -colonoscopy as per GI -discussed HRT will not help hair growth. 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 - MARGINS NOT INVOLVED. - NO ENDOCERVICAL GLANDULAR ATYPIA OR DYSPLASIA.  Will discuss future pap smears with patient at her next visit.    Elsie Lincoln, MD

## 2023-05-31 NOTE — Progress Notes (Signed)
 Patient presents for Annual.  LMP: Postmenopausal  Last pap: 04/29/22 Contraception: Post-menopausal Mammogram: Due, last mammogram: 01/27/23 STD Screening: not indicated Flu Vaccine :  11/24/22  CC:  Wants to know if she can do hormone replacement  .      Subjective:     Carolyn [name] is a 73 y.o. female here for a routine exam.  Current complaints: Having hair growth on her face & curious if HRT would help.     Gynecologic History No LMP recorded. Patient is postmenopausal. LMP: Postmenopausal  Last pap: 04/29/22 Contraception: Post-menopausal Mammogram: Due, last mammogram: 01/27/23 STD Screening: not indicated Flu Vaccine : 11/24/22 Colonoscopy: in 5 years   Obstetric History OB History  Gravida Para Term Preterm AB Living  3 2 2  0 1 2  SAB IAB Ectopic Multiple Live Births  1    2    # Outcome Date GA Lbr Len/2nd Weight Sex Type Anes PTL Lv  3 Term 11/16/83 [redacted]w[redacted]d         2 Term 01/25/76 [redacted]w[redacted]d         1 SAB              The following portions of the patient's history were reviewed and updated as appropriate: allergies, current medications, past family history, past medical history, past social history, past surgical history, and problem list.  Review of Systems Pertinent items noted in HPI and remainder of comprehensive ROS otherwise negative.    Objective:       Vitals:   05/31/23 1113  BP: 131/81  Pulse: 74  Weight: 81.2 kg   Vitals:  WNL General appearance: alert, cooperative and no distress  HEENT: Normocephalic, without obvious abnormality, atraumatic Eyes: negative Throat: lips, mucosa, and tongue normal; teeth and gums normal  Respiratory: Clear to auscultation bilaterally  CV: Regular rate and rhythm  Breasts:  Normal appearance, no masses or tenderness, no nipple retraction or dimpling     GU: External Genitalia:  Tanner V, no lesion Urethra:  No prolapse   Vagina: Pink, normal rugae, no blood or discharge  Cervix: No CMT, no lesion,  cervix VERY small and almost flush with top of vagina  Uterus:  Normal size and contour, non tender, limited by habitus  Adnexa: Normal, no masses, non tender, limited by habitus  Skin: Sun damage, well-healed scar from a basal removal, venous stasis,      Lymphatic: Axillary adenopathy: none     Psychiatric: Normal mood and behavior     Assessment:    Healthy female exam.    Plan:    1. Well woman exam (Primary)  -yearly mammograms -colonoscopy as per GI -discussed HRT will not help hair growth. 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 - MARGINS NOT INVOLVED. - NO ENDOCERVICAL GLANDULAR ATYPIA OR DYSPLASIA.  Will discuss future pap smears with patient at her next visit.

## 2023-06-06 ENCOUNTER — Other Ambulatory Visit: Payer: Self-pay | Admitting: Family Medicine

## 2023-06-06 DIAGNOSIS — E039 Hypothyroidism, unspecified: Secondary | ICD-10-CM

## 2023-06-08 ENCOUNTER — Ambulatory Visit: Payer: Self-pay | Admitting: Family Medicine

## 2023-06-08 NOTE — Telephone Encounter (Signed)
 1st attempt to contact patient-voicemail left for patient to callback the office. Will place in callback folder  Copied from CRM [id number]. Topic: Clinical - Medication Question >> Jun 08, 2023 12:41 PM [name] wrote: Reason for CRM: Patient is calling in requesting some Prednisone be sent in for her to help open her airways.

## 2023-06-08 NOTE — Telephone Encounter (Signed)
  2nd attempt, left voicemail with callback number for patient to return call for nurse triage   Copied from CRM [id number]. Topic: Clinical - Medication Question >> Jun 08, 2023 12:41 PM [name] wrote: Reason for CRM: Patient is calling in requesting some Prednisone be sent in for her to help open her airways.

## 2023-06-08 NOTE — Telephone Encounter (Signed)
 3rd attempt, left voicemail with callback number for patient to return call for nurse triage     Copied from CRM [id number]. Topic: Clinical - Medication Question >> Jun 08, 2023 12:41 PM [name] wrote: Reason for CRM: Patient is calling in requesting some Prednisone be sent in for her to help open her airways.   Reason for Disposition . Third attempt to contact caller AND no contact made. Phone number verified.  Protocols used: No Contact or Duplicate Contact Call-A-AH

## 2023-06-08 NOTE — Telephone Encounter (Signed)
 Copied from CRM [id number]. Topic: Clinical - Medication Question >> Jun 08, 2023 12:41 PM Carolyn [name] wrote: Reason for CRM: Patient is calling in requesting some Prednisone be sent in for her to help open her airways.   Chief Complaint: Shortness of breath  Symptoms: Shortness of breath, cough Frequency: Intermittent  Pertinent Negatives: Patient denies any current shortness of breath  Disposition: [] ED /[] Urgent Care (no appt availability in office) / [x] Appointment(In office/virtual)/ []  [facility]/ [] Home Care/ [x] Refused Recommended Disposition /[] [facility]/ []  Follow-up with PCP Additional Notes: Patient reports she was working in the yard this weekend and began to experience some shortness of breath. She states her symptoms have been intermittent and are not currently present. She states she has similar symptoms when the season changes due to allergies. Patient offered an appointment but she declined, stating she just wanted to see if she could get some Prednisone for her symptoms.  I advised that I would send the request for Prednisone to the office but that I couldn't guarantee it would get filled without an appointment. Patient verbalized understanding.      Reason for Disposition  [1] MILD longstanding difficulty breathing AND [2]  SAME as normal  Answer Assessment - Initial Assessment Questions 1. RESPIRATORY STATUS: "Describe your breathing?" (e.g., wheezing, shortness of breath, unable to speak, severe coughing)      Short of breath  2. ONSET: "When did this breathing problem begin?"      2-3 days ago  3. PATTERN "Does the difficult breathing come and go, or has it been constant since it started?"      Intermittent  4. SEVERITY: "How bad is your breathing?" (e.g., mild, moderate, severe)    - MILD: No SOB at rest, mild SOB with walking, speaks normally in sentences, can lie down, no retractions, pulse < 100.    - MODERATE: SOB at rest, SOB with minimal  exertion and prefers to sit, cannot lie down flat, speaks in phrases, mild retractions, audible wheezing, pulse 100-120.    - SEVERE: Very SOB at rest, speaks in single words, struggling to breathe, sitting hunched forward, retractions, pulse > 120      No shortness of breath now  5. RECURRENT SYMPTOM: "Have you had difficulty breathing before?" If Yes, ask: "When was the last time?" and "What happened that time?"      Yes, seasonal allergies, history of bronchitis  6. CARDIAC HISTORY: "Do you have any history of heart disease?" (e.g., heart attack, angina, bypass surgery, angioplasty)      No 7. LUNG HISTORY: "Do you have any history of lung disease?"  (e.g., pulmonary embolus, asthma, emphysema)     COPD 8. CAUSE: "What do you think is causing the breathing problem?"      Allergies  9. OTHER SYMPTOMS: "Do you have any other symptoms? (e.g., dizziness, runny nose, cough, chest pain, fever)     No 10. O2 SATURATION MONITOR:  "Do you use an oxygen saturation monitor (pulse oximeter) at home?" If Yes, ask: "What is your reading (oxygen level) today?" "What is your usual oxygen saturation reading?" (e.g., 95%)       Unsure 11. PREGNANCY: "Is there any chance you are pregnant?" "When was your last menstrual period?"       No 12. TRAVEL: "Have you traveled out of the country in the last month?" (e.g., travel history, exposures)       No  Protocols used: Breathing Difficulty-A-AH

## 2023-06-09 ENCOUNTER — Encounter: Payer: Self-pay | Admitting: Cardiology

## 2023-06-09 ENCOUNTER — Ambulatory Visit (INDEPENDENT_AMBULATORY_CARE_PROVIDER_SITE_OTHER): Admitting: Cardiology

## 2023-06-09 VITALS — BP 130/82 | HR 88 | Ht 65.0 in | Wt 179.0 lb

## 2023-06-09 DIAGNOSIS — E7800 Pure hypercholesterolemia, unspecified: Secondary | ICD-10-CM

## 2023-06-09 DIAGNOSIS — Z720 Tobacco use: Secondary | ICD-10-CM | POA: Diagnosis not present

## 2023-06-09 DIAGNOSIS — Z952 Presence of prosthetic heart valve: Secondary | ICD-10-CM | POA: Diagnosis not present

## 2023-06-09 DIAGNOSIS — I2510 Atherosclerotic heart disease of native coronary artery without angina pectoris: Secondary | ICD-10-CM

## 2023-06-09 NOTE — Progress Notes (Signed)
 HPI: Follow-up aortic stenosis/aortic valve replacement. Chest CT October 2019 showed aortic atherosclerosis without aneurysm formation. Echocardiogram September 2023 showed normal LV function, grade 1 diastolic dysfunction, moderate left atrial enlargement, moderate to severe aortic stenosis with mean gradient 39 mmHg, peak velocity 4.09 m/s, dimensionless index 0.37, aortic valve area 1.17 cm), mild aortic insufficiency, PFO noted. Abdominal ultrasound 1/24 showed no aneurysm. 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  March 2024 patient underwent aortic valve replacement with an Edwards Inspiris 21 mm bovine pericardial tissue valve.  Postoperative course complicated by atrial fibrillation treated with amiodarone.  Follow-up echocardiogram April 2024 showed normal LV function, mild left ventricular hypertrophy, grade 1 diastolic dysfunction, mild biatrial enlargement, status post aortic valve replacement with mean gradient 12 mmHg and no aortic insufficiency.  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  Patient was subsequently referred to gastroenterology.  Follow-up chest CT February 2025 showed no focal abnormality to correspond with lesion seen on prior chest x-ray and stable 3 mm right upper lobe nodule and no recommended follow-up.  Since last seen she occasionally has dyspnea related to emphysema.  She denies chest pain or syncope.  Current Outpatient Medications  Medication Sig Dispense Refill   albuterol (VENTOLIN HFA) 108 (90 Base) MCG/ACT inhaler TAKE 2 PUFFS BY MOUTH EVERY 6 HOURS AS NEEDED FOR WHEEZE OR SHORTNESS OF BREATH 8.5 each 2   AMBULATORY NON FORMULARY MEDICATION Home health:  Physical therapy-Evaluate  and treat, Occupational therapy-Evaluate and treat, Nursing-Teaching on heart healthy diet, medications, 1 Units 0   AMBULATORY NON FORMULARY MEDICATION Please provide home O2 to be use at bedtime.  Flow 2L/Min. 1 Units 0   amLODipine (NORVASC) 5 MG tablet Take 1 tablet (5 mg total) by mouth daily. 90 tablet 3   ascorbic acid (VITAMIN C) 500 MG tablet Take 500 mg by mouth daily.     aspirin 81 MG EC tablet TAKE 1 TABLET BY MOUTH EVERY DAY (Patient taking differently: Take 81 mg by mouth daily.) 90 tablet 3   atorvastatin (LIPITOR) 10 MG tablet TAKE 1 TABLET BY MOUTH EVERY DAY 90 tablet 3   baclofen (LIORESAL) 10 MG tablet TAKE 1 TABLET BY MOUTH THREE TIMES A DAY AS NEEDED FOR MUSCLE SPASM 270 tablet 0   Calcium Carb-Cholecalciferol (CALCIUM 600 + D PO) Take 1 tablet by mouth 2 (two) times daily.     Cholecalciferol (DIALYVITE VITAMIN D 5000) 125 MCG (5000 UT) capsule Take 5,000 Units by mouth daily.     clonazePAM (KLONOPIN) 0.5 MG tablet TAKE 1 TABLET BY MOUTH EVERYDAY AT BEDTIME 30 tablet 3   fluticasone (FLONASE) 50 MCG/ACT nasal spray SPRAY 1 SPRAY INTO EACH NOSTRIL EVERY DAY 48 mL 1   fluticasone furoate-vilanterol (BREO ELLIPTA) 100-25 MCG/ACT AEPB Inhale 1 puff into the lungs daily. 1 each 11   Garlic 1000 MG CAPS Take 1,000 mg by mouth daily.     levofloxacin (LEVAQUIN) 500 MG tablet Take 1 tablet (500 mg total) by mouth daily. 7 tablet 0   levothyroxine (SYNTHROID) 112 MCG tablet TAKE 1 TABLET BY MOUTH DAILY BEFORE BREAKFAST. 90 tablet 0   magnesium hydroxide (MILK OF MAGNESIA) 400 MG/5ML suspension Take 15 mLs by mouth daily as needed for mild constipation.  meloxicam (MOBIC) 15 MG tablet TAKE 1 TABLET (15 MG TOTAL) BY MOUTH DAILY. 30 tablet 0   Multiple Vitamins-Minerals (CENTRUM SILVER ADULT 50+ PO) Take 1 tablet by mouth daily.     Omega-3 Fatty Acids (FISH OIL) 1200 MG CAPS Take 1,200 mg by mouth daily.     pregabalin (LYRICA) 75 MG capsule Take 1 capsule (75 mg total) by mouth at  bedtime. 30 capsule 5   rOPINIRole (REQUIP) 3 MG tablet TAKE 1 TABLET BY MOUTH EVERYDAY AT BEDTIME 90 tablet 1   zinc gluconate 50 MG tablet Take 50 mg by mouth daily.     No current facility-administered medications for this visit.     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   right shoulder blade   Neuromuscular disorder (HCC)    Restless Leg Syndrome   Pneumonia 2019   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   left nare   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   left upper shin   SCC (squamous cell carcinoma) in situ 03/19/2016   right pinky   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   right thigh, left upper shin   SCC (squamous cell carcinoma) well diff 12/13/2008   left hand   SCC (squamous cell carcinoma) well diff x2 12/25/2015   left and right forearm   Thyroid disease     Past Surgical History:  Procedure Laterality Date   ANKLE SURGERY Right 2000   repaired tendons with pin placed   AORTIC VALVE REPLACEMENT N/A 06/16/2022   Procedure: AORTIC VALVE REPLACEMENT (AVR) USING EDWARDS INSPIRIS SIZE ;  Surgeon: Alleen Borne, MD;  Location: [facility] OR;  Service: Open Heart Surgery;  Laterality: N/A;   CERVICAL CONIZATION W/BX     COLONOSCOPY  2014   EYE SURGERY Bilateral    Cataracts   MELANOMA EXCISION  08/2014   back   RIGHT HEART CATH AND CORONARY  ANGIOGRAPHY N/A 06/02/2022   Procedure: RIGHT HEART CATH AND CORONARY ANGIOGRAPHY;  Surgeon: Tonny Bollman, MD;  Location: [facility] INVASIVE CV LAB;  Service: Cardiovascular;  Laterality: N/A;   SHOULDER SURGERY Right    x2   SHOULDER SURGERY Left    TEE WITHOUT CARDIOVERSION N/A 06/16/2022   Procedure: TRANSESOPHAGEAL ECHOCARDIOGRAM;  Surgeon: Alleen Borne, MD;  Location: [facility] OR;  Service: Open Heart Surgery;  Laterality: N/A;   THUMB ARTHROSCOPY Right    Removed bone in thumb    Social History   Socioeconomic History   Marital status: Divorced    Spouse name: Not on file   Number of children: 2   Years of education: GED   Highest education level: GED or equivalent  Occupational History   Occupation: disabled   Occupation: retired  Tobacco Use  Smoking status: Every Day    Current packs/day: 0.50    Average packs/day: 0.5 packs/day for 55.2 years (27.6 ttl pk-yrs)    Types: Cigarettes    Start date: 04/06/1968    Passive exposure: Never   Smokeless tobacco: Never  Vaping Use   Vaping status: Never Used  Substance and Sexual Activity   Alcohol use: No   Drug use: No   Sexual activity: Not Currently    Partners: Male    Birth control/protection: None  Other Topics Concern   Not on file  Social History Narrative   Lives alone. She had two children, one is deceased. Her son lives in [place]. She has a sister who lives close by who helps her a lot. Her enjoys gardening.    Social Drivers of Health   Financial Resource Strain: Medium Risk (05/14/2023)   Overall Financial Resource Strain (CARDIA)    Difficulty of Paying Living Expenses: Somewhat hard  Food Insecurity: No Food Insecurity (05/14/2023)   Hunger Vital Sign    Worried About Running Out of Food in the Last Year: Never true    Ran Out of Food in the Last Year: Never true  Transportation Needs: No Transportation Needs (05/14/2023)   PRAPARE - Administrator, Civil Service (Medical): No    Lack of  Transportation (Non-Medical): No  Physical Activity: Inactive (05/14/2023)   Exercise Vital Sign    Days of Exercise per Week: 0 days    Minutes of Exercise per Session: 0 min  Stress: No Stress Concern Present (05/14/2023)   [employer] of Occupational Health - Occupational Stress Questionnaire    Feeling of Stress : Not at all  Social Connections: Moderately Integrated (05/14/2023)   Social Connection and Isolation Panel [NHANES]    Frequency of Communication with Friends and Family: More than three times a week    Frequency of Social Gatherings with Friends and Family: Three times a week    Attends Religious Services: More than 4 times per year    Active Member of Clubs or Organizations: Yes    Attends Banker Meetings: Never    Marital Status: Divorced  Catering manager Violence: Not At Risk (06/22/2022)   Humiliation, Afraid, Rape, and Kick questionnaire    Fear of Current or Ex-Partner: No    Emotionally Abused: No    Physically Abused: No    Sexually Abused: No    Family History  Problem Relation Age of Onset   Heart disease Mother    Aneurysm Mother    Heart disease Father    Kidney failure Father    Cancer Sister    COPD Sister    Cancer Brother    Cirrhosis Daughter    Cancer Sister        Bronchial    ROS: no fevers or chills, productive cough, hemoptysis, dysphasia, odynophagia, melena, hematochezia, dysuria, hematuria, rash, seizure activity, orthopnea, PND, pedal edema, claudication. Remaining systems are negative.  Physical Exam: Well-developed well-nourished in no acute distress.  Skin is warm and dry.  HEENT is normal.  Neck is supple.  Chest is clear to auscultation with normal expansion.  Cardiovascular exam is regular rate and rhythm.  2/6 systolic murmur left sternal border.  No diastolic murmur noted. Abdominal exam nontender or distended. No masses palpated. Extremities show no edema. neuro grossly intact  EKG  Interpretation Date/Time:  Wednesday June 09 2023 14:26:34 EST Ventricular Rate:  88 PR Interval:  186 QRS Duration:  82 QT Interval:  376 QTC Calculation: 454 R Axis:   80  Text Interpretation: Sinus rhythm with frequent Premature ventricular complexes Right atrial enlargement Confirmed by Olga Millers ([id number]) on 06/09/2023 2:58:00 PM    A/P  1 status post aortic valve replacement-continue SBE prophylaxis.  Most recent echocardiogram showed normally functioning valve.  2 dilated pancreatic duct-follow-up surgery and gastroenterology.  3 history of pulmonary nodules-follow-up chest CT showed no new lesions and no follow-up recommended.  4 hyperlipidemia-continue statin.  5 tobacco abuse-patient counseled on discontinuing.  6 nonobstructive coronary disease-continue statin.  Olga Millers, MD

## 2023-06-09 NOTE — Patient Instructions (Signed)

## 2023-06-10 MED ORDER — PREDNISONE 20 MG PO TABS: 20.0000 mg | ORAL_TABLET | Freq: Two times a day (BID) | ORAL | 0 refills | Status: DC

## 2023-06-10 NOTE — Addendum Note (Signed)
 Addended by: Mammie Lorenzo on: 06/10/2023 04:53 PM   Modules accepted: Orders

## 2023-06-11 ENCOUNTER — Encounter: Payer: Self-pay | Admitting: Family Medicine

## 2023-06-11 NOTE — Telephone Encounter (Signed)
 Patient informed.

## 2023-06-21 ENCOUNTER — Telehealth: Payer: Self-pay

## 2023-06-21 ENCOUNTER — Encounter (HOSPITAL_COMMUNITY): Payer: Self-pay | Admitting: Gastroenterology

## 2023-06-21 ENCOUNTER — Telehealth: Payer: Self-pay | Admitting: Gastroenterology

## 2023-06-21 NOTE — Telephone Encounter (Signed)
 Copied from CRM [id number]. Topic: Clinical - Prescription Issue >> Jun 21, 2023  4:18 PM Nila Nephew wrote: Reason for CRM: Patient calling in to state that fluticasone furoate-vilanterol (BREO ELLIPTA) 100-25 MCG/ACT AEPB is $600 for her. She states that the generic is $400. Patient would like an alternative prescribed. Patient states [facility] will be faxing a request for prescription of an alternative.

## 2023-06-21 NOTE — Telephone Encounter (Signed)
 Procedure:EUS Procedure date: 06/28/23 Procedure location: [facility] Arrival Time: 10:15 am Spoke with the patient Y/N: Yes Any prep concerns? No  Has the patient obtained the prep from the pharmacy ? No prep needed Do you have a care partner and transportation: Yes Any additional concerns? No

## 2023-06-22 ENCOUNTER — Other Ambulatory Visit: Payer: Self-pay

## 2023-06-22 DIAGNOSIS — I700 Atherosclerosis of aorta: Secondary | ICD-10-CM

## 2023-06-22 MED ORDER — ATORVASTATIN CALCIUM 10 MG PO TABS: 10.0000 mg | ORAL_TABLET | Freq: Every day | ORAL | 1 refills | Status: DC

## 2023-06-23 ENCOUNTER — Telehealth: Payer: Self-pay

## 2023-06-23 NOTE — Telephone Encounter (Signed)
 Copied from CRM [id number]. Topic: Clinical - Prescription Issue >> Jun 23, 2023  2:32 PM Nila Nephew wrote: Reason for CRM: Patient is calling to state that pharmacy states she can be prescribed Wixela (?) as a no-cost alternative to the [facility], as the BREO is entirely too expensive and out of patient's means.

## 2023-06-25 MED ORDER — FLUTICASONE-SALMETEROL 250-50 MCG/ACT IN AEPB: 1.0000 | INHALATION_SPRAY | Freq: Two times a day (BID) | RESPIRATORY_TRACT | 6 refills | Status: DC

## 2023-06-28 ENCOUNTER — Ambulatory Visit (HOSPITAL_BASED_OUTPATIENT_CLINIC_OR_DEPARTMENT_OTHER): Admitting: Anesthesiology

## 2023-06-28 ENCOUNTER — Encounter (HOSPITAL_COMMUNITY)
Admission: RE | Disposition: A | Discharge status: Home / Self Care | Payer: Self-pay | Source: Home / Self Care | Attending: Gastroenterology

## 2023-06-28 ENCOUNTER — Ambulatory Visit (HOSPITAL_COMMUNITY): Admitting: Anesthesiology

## 2023-06-28 ENCOUNTER — Ambulatory Visit (HOSPITAL_COMMUNITY)
Admission: RE | Admit: 2023-06-28 | Discharge: 2023-06-28 | Disposition: A | Discharge status: Home / Self Care | Payer: No Typology Code available for payment source | Attending: Gastroenterology | Admitting: Gastroenterology

## 2023-06-28 ENCOUNTER — Encounter (HOSPITAL_COMMUNITY): Payer: Self-pay | Admitting: Gastroenterology

## 2023-06-28 ENCOUNTER — Other Ambulatory Visit: Payer: Self-pay

## 2023-06-28 DIAGNOSIS — K3189 Other diseases of stomach and duodenum: Secondary | ICD-10-CM | POA: Insufficient documentation

## 2023-06-28 DIAGNOSIS — K449 Diaphragmatic hernia without obstruction or gangrene: Secondary | ICD-10-CM | POA: Insufficient documentation

## 2023-06-28 DIAGNOSIS — I119 Hypertensive heart disease without heart failure: Secondary | ICD-10-CM | POA: Diagnosis not present

## 2023-06-28 DIAGNOSIS — K229 Disease of esophagus, unspecified: Secondary | ICD-10-CM | POA: Diagnosis not present

## 2023-06-28 DIAGNOSIS — F17210 Nicotine dependence, cigarettes, uncomplicated: Secondary | ICD-10-CM

## 2023-06-28 DIAGNOSIS — K2289 Other specified disease of esophagus: Secondary | ICD-10-CM | POA: Diagnosis not present

## 2023-06-28 DIAGNOSIS — J449 Chronic obstructive pulmonary disease, unspecified: Secondary | ICD-10-CM | POA: Insufficient documentation

## 2023-06-28 DIAGNOSIS — Z952 Presence of prosthetic heart valve: Secondary | ICD-10-CM | POA: Insufficient documentation

## 2023-06-28 DIAGNOSIS — K87 Disorders of gallbladder, biliary tract and pancreas in diseases classified elsewhere: Secondary | ICD-10-CM | POA: Diagnosis not present

## 2023-06-28 DIAGNOSIS — B3781 Candidal esophagitis: Secondary | ICD-10-CM | POA: Diagnosis not present

## 2023-06-28 DIAGNOSIS — Z79899 Other long term (current) drug therapy: Secondary | ICD-10-CM | POA: Diagnosis not present

## 2023-06-28 DIAGNOSIS — K838 Other specified diseases of biliary tract: Secondary | ICD-10-CM | POA: Diagnosis not present

## 2023-06-28 DIAGNOSIS — I2510 Atherosclerotic heart disease of native coronary artery without angina pectoris: Secondary | ICD-10-CM

## 2023-06-28 DIAGNOSIS — K860 Alcohol-induced chronic pancreatitis: Secondary | ICD-10-CM | POA: Diagnosis not present

## 2023-06-28 DIAGNOSIS — K862 Cyst of pancreas: Secondary | ICD-10-CM

## 2023-06-28 DIAGNOSIS — K209 Esophagitis, unspecified without bleeding: Secondary | ICD-10-CM

## 2023-06-28 DIAGNOSIS — I899 Noninfective disorder of lymphatic vessels and lymph nodes, unspecified: Secondary | ICD-10-CM

## 2023-06-28 DIAGNOSIS — K8689 Other specified diseases of pancreas: Secondary | ICD-10-CM | POA: Diagnosis not present

## 2023-06-28 DIAGNOSIS — M1990 Unspecified osteoarthritis, unspecified site: Secondary | ICD-10-CM | POA: Diagnosis not present

## 2023-06-28 DIAGNOSIS — K861 Other chronic pancreatitis: Secondary | ICD-10-CM | POA: Diagnosis not present

## 2023-06-28 DIAGNOSIS — Q439 Congenital malformation of intestine, unspecified: Secondary | ICD-10-CM | POA: Diagnosis not present

## 2023-06-28 DIAGNOSIS — K2970 Gastritis, unspecified, without bleeding: Secondary | ICD-10-CM

## 2023-06-28 DIAGNOSIS — I10 Essential (primary) hypertension: Secondary | ICD-10-CM | POA: Diagnosis not present

## 2023-06-28 HISTORY — PX: ESOPHAGOGASTRODUODENOSCOPY: SHX5428

## 2023-06-28 HISTORY — PX: EUS: SHX5427

## 2023-06-28 HISTORY — PX: FINE NEEDLE ASPIRATION: SHX6590

## 2023-06-28 SURGERY — UPPER ENDOSCOPIC ULTRASOUND (EUS) RADIAL
Anesthesia: Monitor Anesthesia Care

## 2023-06-28 MED ORDER — CIPROFLOXACIN IN D5W 400 MG/200ML IV SOLN
INTRAVENOUS | Status: AC
  Filled 2023-06-28: qty 200

## 2023-06-28 MED ORDER — PROPOFOL 500 MG/50ML IV EMUL
INTRAVENOUS | Status: DC | PRN
  Administered 2023-06-28: 30 mg via INTRAVENOUS
  Administered 2023-06-28: 125 ug/kg/min via INTRAVENOUS

## 2023-06-28 MED ORDER — CIPROFLOXACIN HCL 500 MG PO TABS
500.0000 mg | ORAL_TABLET | Freq: Two times a day (BID) | ORAL | 0 refills | Status: AC
Start: 2023-06-28 — End: 2023-07-01

## 2023-06-28 MED ORDER — LIDOCAINE 2% (20 MG/ML) 5 ML SYRINGE
INTRAMUSCULAR | Status: DC | PRN
  Administered 2023-06-28: 100 mg via INTRAVENOUS

## 2023-06-28 MED ORDER — SODIUM CHLORIDE 0.9 % IV SOLN
INTRAVENOUS | Status: DC
Start: 2023-06-28 — End: 2023-06-28

## 2023-06-28 MED ORDER — CIPROFLOXACIN IN D5W 400 MG/200ML IV SOLN
INTRAVENOUS | Status: DC | PRN
  Administered 2023-06-28: 400 mg via INTRAVENOUS

## 2023-06-28 MED ORDER — DICLOFENAC SUPPOSITORY 100 MG
RECTAL | Status: AC
  Filled 2023-06-28: qty 1

## 2023-06-28 MED ORDER — ONDANSETRON HCL 4 MG/2ML IJ SOLN
INTRAMUSCULAR | Status: DC | PRN
  Administered 2023-06-28: 4 mg via INTRAVENOUS

## 2023-06-28 MED ORDER — PROPOFOL 1000 MG/100ML IV EMUL
INTRAVENOUS | Status: AC
  Filled 2023-06-28: qty 100

## 2023-06-28 MED ORDER — PANTOPRAZOLE SODIUM 40 MG PO TBEC: 40.0000 mg | DELAYED_RELEASE_TABLET | Freq: Two times a day (BID) | ORAL | 6 refills | Status: DC

## 2023-06-28 MED ORDER — DICLOFENAC SUPPOSITORY 100 MG
RECTAL | Status: DC | PRN
  Administered 2023-06-28: 100 mg via RECTAL

## 2023-06-28 NOTE — H&P (Signed)
 GASTROENTEROLOGY PROCEDURE H&P NOTE   Primary Care Physician: Everrett Coombe, DO  HPI: Carolyn [name] is a 73 y.o. 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   right shoulder blade   Neuromuscular disorder (HCC)    Restless Leg Syndrome   Pneumonia 2019   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   left nare   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   left upper shin   SCC (squamous cell carcinoma) in situ 03/19/2016   right pinky   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   right thigh, left upper shin   SCC (squamous cell carcinoma) well diff 12/13/2008   left hand   SCC (squamous cell carcinoma) well diff x2 12/25/2015   left and right forearm   Thyroid disease    Past Surgical History:  Procedure Laterality Date   ANKLE SURGERY Right 2000   repaired tendons with pin placed   AORTIC VALVE REPLACEMENT N/A 06/16/2022   Procedure: AORTIC VALVE REPLACEMENT (AVR) USING EDWARDS INSPIRIS SIZE ;  Surgeon: Alleen Borne, MD;  Location: [facility] OR;  Service: Open Heart Surgery;  Laterality: N/A;   CERVICAL CONIZATION W/BX     COLONOSCOPY  2014   EYE SURGERY Bilateral    Cataracts   MELANOMA EXCISION  08/2014   back   RIGHT HEART CATH AND CORONARY ANGIOGRAPHY N/A 06/02/2022    Procedure: RIGHT HEART CATH AND CORONARY ANGIOGRAPHY;  Surgeon: Tonny Bollman, MD;  Location: [facility] INVASIVE CV LAB;  Service: Cardiovascular;  Laterality: N/A;   SHOULDER SURGERY Right    x2   SHOULDER SURGERY Left    TEE WITHOUT CARDIOVERSION N/A 06/16/2022   Procedure: TRANSESOPHAGEAL ECHOCARDIOGRAM;  Surgeon: Alleen Borne, MD;  Location: [facility] OR;  Service: Open Heart Surgery;  Laterality: N/A;   THUMB ARTHROSCOPY Right    Removed bone in thumb   No current facility-administered medications for this encounter.   No current facility-administered medications for this encounter. Allergies  Allergen Reactions   Trazodone And Nefazodone Shortness Of Breath   Azithromycin Rash   Family History  Problem Relation Age of Onset   Heart disease Mother    Aneurysm Mother    Heart disease Father    Kidney failure Father    Cancer Sister    COPD Sister    Cancer Brother    Cirrhosis Daughter    Cancer Sister        Bronchial   Social History   Socioeconomic History   Marital status: Divorced    Spouse name: Not on file   Number of children: 2   Years of education: GED   Highest education level: GED or  equivalent  Occupational History   Occupation: disabled   Occupation: retired  Tobacco Use   Smoking status: Every Day    Current packs/day: 0.50    Average packs/day: 0.5 packs/day for 55.2 years (27.6 ttl pk-yrs)    Types: Cigarettes    Start date: 04/06/1968    Passive exposure: Never   Smokeless tobacco: Never  Vaping Use   Vaping status: Never Used  Substance and Sexual Activity   Alcohol use: No   Drug use: No   Sexual activity: Not Currently    Partners: Male    Birth control/protection: None  Other Topics Concern   Not on file  Social History Narrative   Lives alone. She had two children, one is deceased. Her son lives in [place]. She has a sister who lives close by who helps her a lot. Her enjoys gardening.    Social Drivers of Health   Financial  Resource Strain: Medium Risk (05/14/2023)   Overall Financial Resource Strain (CARDIA)    Difficulty of Paying Living Expenses: Somewhat hard  Food Insecurity: No Food Insecurity (05/14/2023)   Hunger Vital Sign    Worried About Running Out of Food in the Last Year: Never true    Ran Out of Food in the Last Year: Never true  Transportation Needs: No Transportation Needs (05/14/2023)   PRAPARE - Administrator, Civil Service (Medical): No    Lack of Transportation (Non-Medical): No  Physical Activity: Inactive (05/14/2023)   Exercise Vital Sign    Days of Exercise per Week: 0 days    Minutes of Exercise per Session: 0 min  Stress: No Stress Concern Present (05/14/2023)   [employer] of Occupational Health - Occupational Stress Questionnaire    Feeling of Stress : Not at all  Social Connections: Moderately Integrated (05/14/2023)   Social Connection and Isolation Panel [NHANES]    Frequency of Communication with Friends and Family: More than three times a week    Frequency of Social Gatherings with Friends and Family: Three times a week    Attends Religious Services: More than 4 times per year    Active Member of Clubs or Organizations: Yes    Attends Banker Meetings: Never    Marital Status: Divorced  Catering manager Violence: Not At Risk (06/22/2022)   Humiliation, Afraid, Rape, and Kick questionnaire    Fear of Current or Ex-Partner: No    Emotionally Abused: No    Physically Abused: No    Sexually Abused: No    Physical Exam: There were no vitals filed for this visit. There is no height or weight on file to calculate BMI. GEN: NAD EYE: Sclerae anicteric ENT: MMM CV: Non-tachycardic GI: Soft, NT/ND NEURO:  Alert & Oriented x 3  Lab Results: No results for input(s): "WBC", "HGB", "HCT", "PLT" in the last 72 hours. BMET No results for input(s): "NA", "K", "CL", "CO2", "GLUCOSE", "BUN", "CREATININE", "CALCIUM" in the last 72 hours. LFT No results for  input(s): "PROT", "ALBUMIN", "AST", "ALT", "ALKPHOS", "BILITOT", "BILIDIR", "IBILI" in the last 72 hours. PT/INR No results for input(s): "LABPROT", "INR" in the last 72 hours.   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  The risks of an EUS including intestinal perforation, bleeding, infection, aspiration, and medication effects were discussed as was the possibility it may not give a definitive diagnosis if a biopsy is performed.  When  a biopsy of the pancreas is done as part of the EUS, there is an additional risk of pancreatitis at the rate of about 1-2%.  It was explained that procedure related pancreatitis is typically mild, although it can be severe and even life threatening, which is why we do not perform random pancreatic biopsies and only biopsy a lesion/area we feel is concerning enough to warrant the risk.  The risks and benefits of endoscopic evaluation/treatment were discussed with the patient and/or family; these include but are not limited to the risk of perforation, infection, bleeding, missed lesions, lack of diagnosis, severe illness requiring hospitalization, as well as anesthesia and sedation related illnesses.  The patient's history has been reviewed, patient examined, no change in status, and deemed stable for procedure.  The patient and/or family is agreeable to proceed.    Corliss Parish, MD [facility] Office # [phone]

## 2023-06-28 NOTE — Op Note (Signed)
 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 Tomasa Rand, MD Medicines:                Monitored Anesthesia Care Complications:            No immediate complications. Estimated Blood Loss:     Estimated blood loss was minimal. Procedure:                Pre-Anesthesia Assessment:                           - Prior to the procedure, a History and Physical                            was performed, and patient medications and                            allergies were reviewed. The patient's tolerance of                            previous anesthesia was also reviewed. The risks                            and benefits of the procedure and the sedation                            options and risks were discussed with the patient.                            All questions were answered, and informed consent                            was obtained. Prior Anticoagulants: The patient has                            taken no anticoagulant or antiplatelet agents                            except for aspirin. ASA Grade Assessment: III - A  patient with severe systemic disease. After                            reviewing the risks and benefits, the patient was                            deemed in satisfactory condition to undergo the                            procedure.                           After obtaining informed consent,  the endoscope was                            passed under direct vision. Throughout the                            procedure, the patient's blood pressure, pulse, and                            oxygen saturations were monitored continuously. The                            [id number]-H190 ([id number]) Olympus endoscope was introduced                            through the mouth, and advanced to the second part                            of duodenum. The [id number]-[id number] ([id number]) Olympus                            duodenoscope was introduced through the mouth, and                            advanced to the area of papilla. The upper EUS was                            accomplished without difficulty. The patient                            tolerated the procedure. The [id number] ([id number])                            Olympus linear ultrasound scope was introduced                            through the mouth, and advanced to the duodenum for                            ultrasound examination from the stomach and  duodenum. Scope In: Scope Out: Findings:      ENDOSCOPIC FINDING: :      No gross lesions were noted in the proximal esophagus.      Patchy, yellow plaques were found in the mid esophagus and in the distal       esophagus. Biopsies were taken with a cold forceps for histology.      LA Grade A (one or more mucosal breaks less than 5 mm, not extending       between tops of 2 mucosal folds) esophagitis with no bleeding was found       in the distal esophagus.      The Z-line was irregular and was found 40 cm from the incisors.      A 3 cm hiatal hernia was present.      Patchy mildly erythematous mucosa without bleeding was found in the       entire examined stomach. Biopsies were taken with a cold forceps for       histology and Helicobacter pylori testing.      No gross lesions were noted in the duodenal bulb, in the first portion       of the duodenum and in the second  portion of the duodenum.      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      ENDOSONOGRAPHIC FINDING: :      Endosonographic imaging of the pancreas showed sonographic changes       indicative of moderate chronic pancreatitis in the entire pancreas. The       parenchyma had lobularity with honeycombing, cysts and hyperechoic       strands. The pancreatic duct had irregularity of contour, dilated       side-branches, duct dilation and a hyperechoic duct margin.      An anechoic and septated lesion suggestive of a cyst was identified in       the immediate transition of the genu into the pancreatic body. Another       cyst was found in the tail of the pancreas. There appeared to be       communication with both of the cysts with the pancreatic duct. The       largest cystic component was noted within the tail of the pancreas       lesion measured 20 mm by 14 mm in maximal cross-sectional diameter.       There were a few compartments thinly septated. The outer wall of the       lesion was thin. There was no associated mass. There was no internal       debris within the fluid-filled cavity. The PD upstream of this cystic       formation was 8.1 mm in size preaspiration. Diagnostic needle aspiration       for fluid was performed. Color Doppler imaging was utilized prior to       needle puncture to confirm a lack of significant vascular structures       within the needle path. One pass was made with the 22 gauge needle using       a transgastric approach. A stylet was used. The amount of fluid       collected was 2 mL. The fluid was clear, white and viscous. Sample(s)       were sent for chemistry, amylase concentration, cytology and CEA.  Interestingly, after the cyst was aspirated, the PD only measured 3.4 mm.      Endosonographic imaging in the entire pancreas showed no solid masses       even in the body of pancreas to tail of pancreas transition  where the PD       went from 4.4 mm to 3.0 mm eventually to 8.1 mm.      There was prominence in the common bile duct (3.0 -> 7.0 mm) and in the       common hepatic duct (7.7 mm).      Endosonographic imaging of the ampulla showed no intramural       (subepithelial) lesion.      Endosonographic imaging in the visualized portion of the liver showed no       mass.      No malignant-appearing lymph nodes were visualized in the celiac region       (level 20), peripancreatic region and porta hepatis region.      The celiac region was visualized. There appears to be potential atheroma       at the celiac takeoff. Impression:               EGD impression:                           - No gross lesions in the proximal esophagus.                           - Esophageal plaques were found, suspicious for                            candidiasis found in the middle and distal                            esophagus. Biopsied.                           - LA Grade A esophagitis with no bleeding found                            distally.                           - Z-line irregular, 40 cm from the incisors.                           - 3 cm hiatal hernia.                           - Erythematous mucosa in the stomach. Biopsied.                           - No gross lesions in the duodenal bulb, in the                            first portion of the duodenum and in the second  portion of the duodenum.                           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                           EUS impression:                           - Endosonographic imaging of the pancreas showed                            sonographic changes consistent with moderate                            chronic pancreatitis.                           - 2 cystic lesions were seen in the immediate                            transition of the genu into the  pancreatic body,                            and in the tail of the pancreas. Cytology results                            are pending. 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                            Fine needle aspiration for fluid performed of the                            tail of pancreas cyst (this led to decompression of                            the pancreas duct).                           - No solid mass noted within the pancreas today.                           - There was dilation in  the common bile duct and in                            the common hepatic duct.                           - No malignant-appearing lymph nodes were                            visualized in the celiac region (level 20),                            peripancreatic region and porta hepatis region.                           - Potential atheroma noted at the celiac takeoff. Moderate Sedation:      Not Applicable - Patient had care per Anesthesia. Recommendation:           - The patient will be observed post-procedure,                            until all discharge criteria are met.                           - Discharge patient to home.                           - Patient has a contact number available for                            emergencies. The signs and symptoms of potential                            delayed complications were discussed with the                            patient. Return to normal activities tomorrow.                            Written discharge instructions were provided to the                            patient.                           - Low fat diet for 1 week.                           - Observe patient's clinical course.                           -  Ciprofloxacin 500 mg twice daily x 3 days to                            decrease risk of post interventional infection.                           -  Protonix 40 mg twice daily x 2 months then may                            decrease to once daily. Patient may follow-up with                            primary gastroenterologist in case additional                            follow-up of esophagitis is required.                           - Continue present medications otherwise.                           - Patient is high risk for pancreatitis post                            sampling, reasoning for diclofenac administration                            via suppository to try to decrease her risk.                           - Await cytology results and await path results.                           - Surveillance versus follow-up with surgery                            pancreaticobiliary to be discussed based on results                            fluid analysis.                           - Follow-up with primary care in regards to any                            additional workup that could require evaluation of                            the celiac artery takeoff. She does not have                            symptomatology of mesenteric ischemia.                           - The findings and recommendations were discussed                            with the patient.                           - The findings and recommendations were  discussed                            with the patient's family. Procedure Code(s):        --- Professional ---                           [phone], Esophagogastroduodenoscopy, flexible,                            transoral; with transendoscopic ultrasound-guided                            intramural or transmural fine needle                            aspiration/biopsy(s), (includes endoscopic                            ultrasound examination limited to the esophagus,                             stomach or duodenum, and adjacent structures)                           [id number], 59, Esophagogastroduodenoscopy, flexible,                            transoral; with biopsy, single or multiple Diagnosis Code(s):        --- Professional ---                           K22.9, Disease of esophagus, unspecified                           K20.90, Esophagitis, unspecified without bleeding                           K22.89, Other specified disease of esophagus                           K44.9, Diaphragmatic hernia without obstruction or                            gangrene                           K31.89, Other diseases of stomach and duodenum                           K86.2, Cyst of pancreas                           I89.9, Noninfective disorder of lymphatic vessels                            and lymph nodes, unspecified  K86.89, Other specified diseases of pancreas                           R93.3, Abnormal findings on diagnostic imaging of                            other parts of digestive tract                           K83.8, Other specified diseases of biliary tract CPT copyright 2022 American Medical Association. All rights reserved. The codes documented in this report are preliminary and upon coder review may  be revised to meet current compliance requirements. Corliss Parish, MD 06/28/2023 1:15:52 PM Number of Addenda: 0

## 2023-06-28 NOTE — Anesthesia Postprocedure Evaluation (Signed)
 Anesthesia Post Note  Patient: Carolyn [name]  Procedure(s) Performed: UPPER ENDOSCOPIC ULTRASOUND (EUS) RADIAL EGD (ESOPHAGOGASTRODUODENOSCOPY) FINE NEEDLE ASPIRATION     Patient location during evaluation: PACU Anesthesia Type: MAC Level of consciousness: awake Pain management: pain level controlled Vital Signs Assessment: post-procedure vital signs reviewed and stable Respiratory status: spontaneous breathing, nonlabored ventilation and respiratory function stable Cardiovascular status: stable and blood pressure returned to baseline Postop Assessment: no apparent nausea or vomiting Anesthetic complications: no   No notable events documented.  Last Vitals:  Vitals:   06/28/23 1310 06/28/23 1320  BP:  124/80  Pulse: 84 77  Resp: 18 (!) 25  Temp:    SpO2: 98% 93%    Last Pain:  Vitals:   06/28/23 1320  TempSrc:   PainSc: 0-No pain                 Linton Rump

## 2023-06-28 NOTE — Anesthesia Preprocedure Evaluation (Addendum)
 Anesthesia Evaluation  Patient identified by MRN, date of birth, ID band Patient awake    Reviewed: Allergy & Precautions, NPO status , Patient's Chart, lab work & pertinent test results  History of Anesthesia Complications Negative for: history of anesthetic complications  Airway Mallampati: III  TM Distance: >3 FB Neck ROM: Full   Comment: Previous grade I view with MAC 3, easy mask with OPA Dental  (+) Upper Dentures, Lower Dentures, Dental Advisory Given   Pulmonary neg shortness of breath, neg sleep apnea, COPD (used inhaler this morning),  COPD inhaler, neg recent URI, Current Smoker and Patient abstained from smoking. Nodule    Pulmonary exam normal breath sounds clear to auscultation       Cardiovascular hypertension (amlodipine), Pt. on medications (-) angina + CAD (mild, non-obstructive)  (-) Past MI, (-) Cardiac Stents and (-) CABG (-) dysrhythmias + Valvular Problems/Murmurs (severe AS s/p AVR 06/16/2022)  Rhythm:Regular Rate:Normal  HLD  TTE 07/28/2022: IMPRESSIONS    1. Left ventricular ejection fraction, by estimation, is 60 to 65%. The  left ventricle has normal function. The left ventricle has no regional  wall motion abnormalities. There is mild concentric left ventricular  hypertrophy. Left ventricular diastolic  parameters are consistent with Grade I diastolic dysfunction (impaired  relaxation).   2. Right ventricular systolic function is normal. The right ventricular  size is normal. There is normal pulmonary artery systolic pressure. The  estimated right ventricular systolic pressure is 28.8 mmHg.   3. Left atrial size was mildly dilated.   4. Right atrial size was mildly dilated.   5. The mitral valve is grossly normal. No evidence of mitral valve  regurgitation. No evidence of mitral stenosis.   6. The aortic valve has been repaired/replaced. Aortic valve  regurgitation is not visualized. No aortic  stenosis is present. There is a  21 mm Edwards Inspiris Resilia valve present in the aortic position. Echo  findings are consistent with normal  structure and function of the aortic valve prosthesis. Aortic valve area,  by VTI measures 2.16 cm. Aortic valve mean gradient measures 12.0 mmHg.  Aortic valve Vmax measures 2.31 m/s.   7. The inferior vena cava is normal in size with greater than 50%  respiratory variability, suggesting right atrial pressure of 3 mmHg.     Neuro/Psych neg Seizures PSYCHIATRIC DISORDERS Anxiety Depression       GI/Hepatic Neg liver ROS,neg GERD  ,,Dilated pancreatic duct   Endo/Other  neg diabetesHypothyroidism    Renal/GU negative Renal ROS     Musculoskeletal  (+) Arthritis ,    Abdominal   Peds  Hematology negative hematology ROS (+) Lab Results      Component                Value               Date                      WBC                      8.6                 05/19/2023                HGB                      14.9  05/19/2023                HCT                      44.6                05/19/2023                MCV                      89                  05/19/2023                PLT                      232                 05/19/2023              Anesthesia Other Findings   Reproductive/Obstetrics Cervical cancer                             Anesthesia Physical Anesthesia Plan  ASA: 3  Anesthesia Plan: MAC   Post-op Pain Management: Minimal or no pain anticipated   Induction: Intravenous  PONV Risk Score and Plan: 1 and Propofol infusion, TIVA and Treatment may vary due to age or medical condition  Airway Management Planned: Natural Airway and Simple Face Mask  Additional Equipment:   Intra-op Plan:   Post-operative Plan:   Informed Consent: I have reviewed the patients History and Physical, chart, labs and discussed the procedure including the risks, benefits and alternatives for  the proposed anesthesia with the patient or authorized representative who has indicated his/her understanding and acceptance.     Dental advisory given  Plan Discussed with: CRNA and Anesthesiologist  Anesthesia Plan Comments: (Discussed with patient risks of MAC including, but not limited to, minor pain or discomfort, hearing people in the room, and possible need for backup general anesthesia. Risks for general anesthesia also discussed including, but not limited to, sore throat, hoarse voice, chipped/damaged teeth, injury to vocal cords, nausea and vomiting, allergic reactions, lung infection, heart attack, stroke, and death. All questions answered. )        Anesthesia Quick Evaluation

## 2023-06-28 NOTE — Discharge Instructions (Signed)

## 2023-06-28 NOTE — Anesthesia Procedure Notes (Signed)
 Procedure Name: MAC Date/Time: 06/28/2023 12:01 PM  Performed by: Orest Dikes, CRNAPre-anesthesia Checklist: Patient identified, Emergency Drugs available, Suction available and Patient being monitored Oxygen Delivery Method: Simple face mask

## 2023-06-28 NOTE — Transfer of Care (Signed)
 Immediate Anesthesia Transfer of Care Note  Patient: Carolyn [name]  Procedure(s) Performed: UPPER ENDOSCOPIC ULTRASOUND (EUS) RADIAL EGD (ESOPHAGOGASTRODUODENOSCOPY) FINE NEEDLE ASPIRATION  Patient Location: PACU and Endoscopy Unit  Anesthesia Type:MAC  Level of Consciousness: awake, alert , and oriented  Airway & Oxygen Therapy: Patient Spontanous Breathing and Patient connected to face mask oxygen  Post-op Assessment: Report given to RN and Post -op Vital signs reviewed and stable  Post vital signs: Reviewed and stable  Last Vitals:  Vitals Value Taken Time  BP    Temp    Pulse    Resp    SpO2      Last Pain:  Vitals:   06/28/23 0923  TempSrc: Temporal  PainSc: 0-No pain         Complications: No notable events documented.

## 2023-06-28 NOTE — Telephone Encounter (Signed)
 Attempted call to patient. Left a detailed voice mail message on patient home # ( allowed on DPR )

## 2023-06-29 ENCOUNTER — Encounter: Payer: Self-pay | Admitting: Gastroenterology

## 2023-06-29 ENCOUNTER — Encounter (HOSPITAL_COMMUNITY): Payer: Self-pay | Admitting: Gastroenterology

## 2023-06-29 LAB — SURGICAL PATHOLOGY

## 2023-06-30 ENCOUNTER — Other Ambulatory Visit: Payer: Self-pay | Admitting: *Deleted

## 2023-06-30 LAB — CYTOLOGY - NON PAP

## 2023-06-30 MED ORDER — FLUCONAZOLE 100 MG PO TABS: ORAL_TABLET | ORAL | 0 refills | Status: DC

## 2023-06-30 NOTE — Progress Notes (Signed)
flucon

## 2023-07-07 ENCOUNTER — Ambulatory Visit: Payer: No Typology Code available for payment source | Admitting: Cardiology

## 2023-07-10 ENCOUNTER — Other Ambulatory Visit: Payer: Self-pay | Admitting: Family Medicine

## 2023-07-10 DIAGNOSIS — Z87410 Personal history of cervical dysplasia: Secondary | ICD-10-CM

## 2023-07-10 DIAGNOSIS — M5412 Radiculopathy, cervical region: Secondary | ICD-10-CM

## 2023-07-11 ENCOUNTER — Encounter: Payer: Self-pay | Admitting: Gastroenterology

## 2023-07-11 NOTE — Progress Notes (Signed)
 [facility] pancreatic fluid analysis  CEA 209 ng/mL Amylase 55,138 units/L Glucose less than 4.3 mg/dL  Cytology Nondiagnostic Epithelial cell atypia = acellular Cellularity = acellular Cell comparison = acellular Mucin = scant Proteinaceous debris = scant Inflammation = absent  Additional PancraGEN molecular testing is pending at this time and the patient will be updated when this returns.  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  Will update the patient's surgeon as well.  For now no changes in plan of action, until additional laboratories are back.   Carolyn Parish, MD [facility] Office # [phone]

## 2023-07-12 ENCOUNTER — Other Ambulatory Visit: Payer: Self-pay | Admitting: Family Medicine

## 2023-07-12 DIAGNOSIS — M5412 Radiculopathy, cervical region: Secondary | ICD-10-CM

## 2023-07-12 DIAGNOSIS — Z87410 Personal history of cervical dysplasia: Secondary | ICD-10-CM

## 2023-07-12 NOTE — Progress Notes (Signed)
 The patient has been notified of this information and all questions answered. She will await further results

## 2023-07-12 NOTE — Telephone Encounter (Unsigned)
 Copied from CRM [id number]. Topic: Clinical - Medication Refill >> Jul 12, 2023 11:01 AM Philippa Chester F wrote: Most Recent Primary Care Visit:  Provider: Everrett Coombe  Department: [facility]  Visit Type: OFFICE VISIT  Date: 05/19/2023  Medication: clonazePAM (KLONOPIN) 0.5 MG tablet  Has the patient contacted their pharmacy? Yes   Is this the correct pharmacy for this prescription? Yes  This is the patient's preferred pharmacy:  [id number] Lorenza Evangelist, [place] - [address] [place] [address] Phone: [phone] Fax: [phone]   Has the prescription been filled recently? No  Is the patient out of the medication? Yes  Has the patient been seen for an appointment in the last year OR does the patient have an upcoming appointment? Yes  Can we respond through MyChart? Yes  Agent: Please be advised that Rx refills may take up to 3 business days. We ask that you follow-up with your pharmacy.

## 2023-07-13 DIAGNOSIS — K860 Alcohol-induced chronic pancreatitis: Secondary | ICD-10-CM | POA: Diagnosis not present

## 2023-07-13 DIAGNOSIS — K87 Disorders of gallbladder, biliary tract and pancreas in diseases classified elsewhere: Secondary | ICD-10-CM | POA: Diagnosis not present

## 2023-07-13 DIAGNOSIS — K862 Cyst of pancreas: Secondary | ICD-10-CM | POA: Diagnosis not present

## 2023-07-13 NOTE — Telephone Encounter (Signed)
 Requesting rx rf of clonazepam 0.5mg  Last written 03/01/2023  Last OV 05/19/2023 Upcoming appt =07/15/2023 [facility] 11/16/2023 Dr. Ashley Royalty

## 2023-07-14 ENCOUNTER — Telehealth: Payer: Self-pay | Admitting: Gastroenterology

## 2023-07-14 NOTE — Telephone Encounter (Signed)
 Pt states [facility] is not in network and she cannot see them. She states she can go to [facility] or [facility] per her insurance. Where would you like to refer pt?

## 2023-07-14 NOTE — Telephone Encounter (Signed)
 Inbound call from patient stating she was referred with be seen with [facility] by Dr. Tomasa Rand. Stated provider at [facility] stated she would like to see the patient regarding concerns of recent endoscopy. Patient stated she is unable to schedule an appointment due to [facility] not being in network with her insurance. Requesting a call back to discuss further. Please advise, thank you.

## 2023-07-15 ENCOUNTER — Other Ambulatory Visit: Payer: Self-pay

## 2023-07-15 ENCOUNTER — Ambulatory Visit (INDEPENDENT_AMBULATORY_CARE_PROVIDER_SITE_OTHER)

## 2023-07-15 VITALS — Ht 65.0 in | Wt 180.0 lb

## 2023-07-15 DIAGNOSIS — Z0000 Encounter for general adult medical examination without abnormal findings: Secondary | ICD-10-CM | POA: Diagnosis not present

## 2023-07-15 NOTE — Telephone Encounter (Signed)
 Referral faxed to [facility] surgery to [phone]. Pt aware of referral.

## 2023-07-15 NOTE — Telephone Encounter (Signed)
 Patient is calling for update on medication refill request. Has been 3 business days and she said the pharmacy still doesn't have medication.

## 2023-07-15 NOTE — Progress Notes (Signed)
 Subjective:   Carolyn [name] is a 73 y.o. female who presents for Medicare Annual (Subsequent) preventive examination.  Visit Complete: Virtual I connected with  Baldemar Friday on 07/15/23 by a audio enabled telemedicine application and verified that I am speaking with the correct person using two identifiers.  Patient Location: Home  Provider Location: Office/Clinic  I discussed the limitations of evaluation and management by telemedicine. The patient expressed understanding and agreed to proceed.  Vital Signs: Because this visit was a virtual/telehealth visit, some criteria may be missing or patient reported. Any vitals not documented were not able to be obtained and vitals that have been documented are patient reported.  Patient Medicare AWV questionnaire was completed by the patient on 07/11/2023; I have confirmed that all information answered by patient is correct and no changes since this date.  Cardiac Risk Factors include: advanced age (>79men, >47 women);obesity (BMI >30kg/m2);smoking/ tobacco exposure;dyslipidemia;family history of premature cardiovascular disease     Objective:    Today's Vitals   07/15/23 1420  Weight: 180 lb (81.6 kg)  Height: 5\' 5"  (1.651 m)   Body mass index is 29.95 kg/m.     07/15/2023    2:36 PM 06/28/2023    9:19 AM 10/27/2022    9:04 PM 07/02/2022    3:07 PM 06/16/2022    5:51 AM 06/12/2022    1:58 PM 06/01/2022    6:31 PM  Advanced Directives  Does Patient Have a Medical Advance Directive? Yes Yes Yes Yes Yes Yes Yes  Type of Estate agent of [place];Living will Healthcare Power of [place];Living will Healthcare Power of [place];Living will Living will Healthcare Power of [place];Living will Healthcare Power of [place];Living will Healthcare Power of [place];Living will  Does patient want to make changes to medical advance directive?   No - Patient declined No - Patient declined No - Patient declined  No - Patient  declined  Copy of Healthcare Power of Attorney in Chart? Yes - validated most recent copy scanned in chart (See row information) Yes - validated most recent copy scanned in chart (See row information) No - copy requested No - copy requested No - copy requested Yes - validated most recent copy scanned in chart (See row information) No - copy requested    Current Medications (verified) Outpatient Encounter Medications as of 07/15/2023  Medication Sig   albuterol (VENTOLIN HFA) 108 (90 Base) MCG/ACT inhaler TAKE 2 PUFFS BY MOUTH EVERY 6 HOURS AS NEEDED FOR WHEEZE OR SHORTNESS OF BREATH   ascorbic acid (VITAMIN C) 500 MG tablet Take 500 mg by mouth daily.   aspirin 81 MG EC tablet TAKE 1 TABLET BY MOUTH EVERY DAY (Patient taking differently: Take 81 mg by mouth daily.)   atorvastatin (LIPITOR) 10 MG tablet Take 1 tablet (10 mg total) by mouth daily.   baclofen (LIORESAL) 10 MG tablet TAKE 1 TABLET BY MOUTH THREE TIMES A DAY AS NEEDED FOR MUSCLE SPASM   Calcium Carb-Cholecalciferol (CALCIUM 600 + D PO) Take 1 tablet by mouth 2 (two) times daily.   Cholecalciferol (DIALYVITE VITAMIN D 5000) 125 MCG (5000 UT) capsule Take 5,000 Units by mouth daily.   clonazePAM (KLONOPIN) 0.5 MG tablet TAKE 1 TABLET BY MOUTH EVERYDAY AT BEDTIME   fluticasone (FLONASE) 50 MCG/ACT nasal spray SPRAY 1 SPRAY INTO EACH NOSTRIL EVERY DAY   fluticasone-salmeterol (WIXELA INHUB) 250-50 MCG/ACT AEPB Inhale 1 puff into the lungs in the morning and at bedtime.   Garlic 1000 MG CAPS Take 1,000  mg by mouth daily.   levothyroxine (SYNTHROID) 112 MCG tablet TAKE 1 TABLET BY MOUTH DAILY BEFORE BREAKFAST.   magnesium hydroxide (MILK OF MAGNESIA) 400 MG/5ML suspension Take 15 mLs by mouth daily as needed for mild constipation.   Multiple Vitamins-Minerals (CENTRUM SILVER ADULT 50+ PO) Take 1 tablet by mouth daily.   Omega-3 Fatty Acids (FISH OIL) 1200 MG CAPS Take 1,200 mg by mouth daily.   pantoprazole (PROTONIX) 40 MG tablet Take 1  tablet (40 mg total) by mouth 2 (two) times daily before a meal.   pregabalin (LYRICA) 75 MG capsule Take 1 capsule (75 mg total) by mouth at bedtime.   rOPINIRole (REQUIP) 3 MG tablet TAKE 1 TABLET BY MOUTH EVERYDAY AT BEDTIME   zinc gluconate 50 MG tablet Take 50 mg by mouth daily.   AMBULATORY NON FORMULARY MEDICATION Home health:  Physical therapy-Evaluate and treat, Occupational therapy-Evaluate and treat, Nursing-Teaching on heart healthy diet, medications,   AMBULATORY NON FORMULARY MEDICATION Please provide home O2 to be use at bedtime.  Flow 2L/Min.   amLODipine (NORVASC) 5 MG tablet Take 1 tablet (5 mg total) by mouth daily. (Patient not taking: Reported on 07/15/2023)   meloxicam (MOBIC) 15 MG tablet TAKE 1 TABLET (15 MG TOTAL) BY MOUTH DAILY. (Patient not taking: Reported on 07/15/2023)   [DISCONTINUED] fluconazole (DIFLUCAN) 100 MG tablet Take 2 tablet (200 mg) by mouth once on day 1. Then, Take 1 tablet (100 mg) by mouth once daily days 2-14.   [DISCONTINUED] levofloxacin (LEVAQUIN) 500 MG tablet Take 1 tablet (500 mg total) by mouth daily.   [DISCONTINUED] predniSONE (DELTASONE) 20 MG tablet Take 1 tablet (20 mg total) by mouth 2 (two) times daily with a meal.   No facility-administered encounter medications on file as of 07/15/2023.    Allergies (verified) Trazodone and nefazodone and Azithromycin   History: 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   right shoulder  blade   Neuromuscular disorder (HCC)    Restless Leg Syndrome   Pneumonia 2019   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   left nare   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   left upper shin   SCC (squamous cell carcinoma) in situ 03/19/2016   right pinky   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   right thigh, left upper shin   SCC (squamous cell carcinoma) well diff 12/13/2008   left hand   SCC (squamous cell carcinoma) well diff x2 12/25/2015   left and right forearm   Thyroid disease    Past Surgical History:  Procedure Laterality Date   ANKLE SURGERY Right 2000   repaired tendons with pin placed   AORTIC VALVE REPLACEMENT N/A 06/16/2022   Procedure: AORTIC VALVE REPLACEMENT (AVR) USING EDWARDS INSPIRIS SIZE  ;  Surgeon: Alleen Borne, MD;  Location: [facility] OR;  Service: Open Heart Surgery;  Laterality: N/A;   CERVICAL CONIZATION W/BX     COLONOSCOPY  2014   ESOPHAGOGASTRODUODENOSCOPY N/A 06/28/2023   Procedure: EGD (ESOPHAGOGASTRODUODENOSCOPY);  Surgeon: Lemar Lofty., MD;  Location: [facility];  Service: Gastroenterology;  Laterality: N/A;   EUS N/A 06/28/2023   Procedure: UPPER ENDOSCOPIC ULTRASOUND (EUS) RADIAL;  Surgeon: Lemar Lofty., MD;  Location: [facility];  Service: Gastroenterology;  Laterality: N/A;   EYE SURGERY Bilateral    Cataracts   FINE NEEDLE ASPIRATION  06/28/2023   Procedure: FINE NEEDLE ASPIRATION;  Surgeon: Lemar Lofty., MD;  Location: [facility];  Service: Gastroenterology;;   MALIGNANT SKIN LESION EXCISION Left 04/06/2023   MELANOMA EXCISION  08/2014   back   RIGHT HEART CATH AND CORONARY ANGIOGRAPHY N/A 06/02/2022   Procedure: RIGHT HEART CATH AND CORONARY ANGIOGRAPHY;  Surgeon: Tonny Bollman, MD;  Location: [facility] INVASIVE CV LAB;  Service: Cardiovascular;  Laterality: N/A;   SHOULDER SURGERY Right    x2    SHOULDER SURGERY Left    TEE WITHOUT CARDIOVERSION N/A 06/16/2022   Procedure: TRANSESOPHAGEAL ECHOCARDIOGRAM;  Surgeon: Alleen Borne, MD;  Location: [facility] OR;  Service: Open Heart Surgery;  Laterality: N/A;   THUMB ARTHROSCOPY Right    Removed bone in thumb   Family History  Problem Relation Age of Onset   Heart disease Mother    Aneurysm Mother    Heart disease Father    Kidney failure Father    Cancer Sister    COPD Sister    Cancer Brother    Cirrhosis Daughter    Cancer Sister        Bronchial   Social History   Socioeconomic History   Marital status: Divorced    Spouse name: Not on file   Number of children: 2   Years of education: GED   Highest education level: GED or equivalent  Occupational History   Occupation: disabled   Occupation: retired  Tobacco Use   Smoking status: Every Day    Current packs/day: 0.50    Average packs/day: 0.5 packs/day for 55.3 years (27.6 ttl pk-yrs)    Types: Cigarettes    Start date: 04/06/1968    Passive exposure: Never   Smokeless tobacco: Never  Vaping Use   Vaping status: Never Used  Substance and Sexual Activity   Alcohol use: No   Drug use: No   Sexual activity: Not Currently    Partners: Male    Birth control/protection: None  Other Topics Concern   Not on file  Social History Narrative   Lives alone. She had two children, one is deceased. Her son lives in [place]. She has a sister who lives close by who helps her a lot. Her enjoys gardening.    Social Drivers of Corporate investment banker Strain: Low Risk  (07/15/2023)   Overall Financial Resource Strain (CARDIA)    Difficulty of Paying Living Expenses: Not hard at all  Recent Concern: Financial Resource Strain - Medium Risk (05/14/2023)   Overall Financial Resource Strain (CARDIA)    Difficulty of Paying Living Expenses: Somewhat hard  Food Insecurity: No Food Insecurity (07/15/2023)   Hunger Vital Sign    Worried About Running Out of Food in the Last Year:  Never true    Ran Out of Food in the Last Year: Never true  Transportation Needs: No Transportation Needs (07/15/2023)   PRAPARE - Transportation  Lack of Transportation (Medical): No    Lack of Transportation (Non-Medical): No  Physical Activity: Inactive (07/15/2023)   Exercise Vital Sign    Days of Exercise per Week: 0 days    Minutes of Exercise per Session: 0 min  Stress: No Stress Concern Present (07/15/2023)   [employer] of Occupational Health - Occupational Stress Questionnaire    Feeling of Stress : Not at all  Social Connections: Moderately Integrated (07/15/2023)   Social Connection and Isolation Panel [NHANES]    Frequency of Communication with Friends and Family: More than three times a week    Frequency of Social Gatherings with Friends and Family: Three times a week    Attends Religious Services: More than 4 times per year    Active Member of Clubs or Organizations: Yes    Attends Banker Meetings: Never    Marital Status: Divorced    Tobacco Counseling Ready to quit: Not Answered Counseling given: Not Answered   Clinical Intake:  Pre-visit preparation completed: Yes  Pain : No/denies pain     BMI - recorded: 29.95 Nutritional Status: BMI 25 -29 Overweight Nutritional Risks: None Diabetes: No  How often do you need to have someone help you when you read instructions, pamphlets, or other written materials from your doctor or pharmacy?: 1 - Never What is the last grade level you completed in school?: 12  Interpreter Needed?: No      Activities of Daily Living    07/15/2023    2:22 PM 07/11/2023    8:52 AM  In your present state of health, do you have any difficulty performing the following activities:  Hearing? 0 0  Vision? 1 1  Difficulty concentrating or making decisions? 0 0  Walking or climbing stairs? 0 0  Dressing or bathing? 0 0  Doing errands, shopping? 0 0  Preparing Food and eating ? N N  Using the Toilet? N N  In  the past six months, have you accidently leaked urine? Y Y  Do you have problems with loss of bowel control? N N  Managing your Medications? N N  Managing your Finances? N N  Housekeeping or managing your Housekeeping? N N    Patient Care Team: Everrett Coombe, DO as PCP - General (Family Medicine) Jens Som Madolyn Frieze, MD as PCP - Cardiology (Cardiology) Pcp, No Beverely Low, MD as Consulting Physician (Orthopedic Surgery) Catalina Antigua, MD as Consulting Physician (Obstetrics and Gynecology)  Indicate any recent Medical Services you may have received from other than [facility] providers in the past year (date may be approximate).     Assessment:   This is a routine wellness examination for Carolyn [name].  Hearing/Vision screen No results found.   Goals Addressed             This Visit's Progress    Weight (lb) < 180 lb (81.6 kg)   180 lb (81.6 kg)    She would like to lose weight.       Depression Screen    07/15/2023    2:35 PM 07/02/2022    3:08 PM 07/01/2022    4:09 PM 05/18/2022   11:15 AM 11/14/2021   10:35 AM 07/15/2021   10:43 AM 04/09/2021   10:43 AM  PHQ 2/9 Scores  PHQ - 2 Score 0 0 0 0 0 0 5  PHQ- 9 Score   2  2 2 12     Fall Risk    07/15/2023    2:37 PM 07/11/2023  8:52 AM 11/16/2022   11:23 AM 07/02/2022    3:08 PM 07/02/2022    6:11 AM  Fall Risk   Falls in the past year? 0 0 0 0 0  Number falls in past yr: 0 0 0 0 0  Injury with Fall? 0 0 0 0 0  Risk for fall due to : No Fall Risks  No Fall Risks No Fall Risks   Follow up Falls evaluation completed  Falls evaluation completed Falls evaluation completed     MEDICARE RISK AT HOME: Medicare Risk at Home Any stairs in or around the home?: Yes If so, are there any without handrails?: Yes Home free of loose throw rugs in walkways, pet beds, electrical cords, etc?: Yes Adequate lighting in your home to reduce risk of falls?: Yes Life alert?: No Use of a cane, walker or w/c?: No Grab bars in the bathroom?:  Yes Shower chair or bench in shower?: Yes Elevated toilet seat or a handicapped toilet?: Yes  TIMED UP AND GO:  Was the test performed?  No    Cognitive Function:    05/12/2018    1:03 PM 05/11/2017    8:30 AM  MMSE - Mini Mental State Exam  Orientation to time 5 5  Orientation to Place 4 5  Registration 3 3  Attention/ Calculation 5 3  Recall 3 3  Language- name 2 objects 2 2  Language- repeat 1 1  Language- follow 3 step command 3 3  Language- read & follow direction 1 1  Write a sentence 1 1  Copy design 1 1  Total score 29 28        07/15/2023    2:38 PM 07/02/2022    3:14 PM  6CIT Screen  What Year? 0 points 0 points  What month? 0 points 0 points  What time? 0 points 0 points  Count back from 20 0 points 0 points  Months in reverse 0 points 0 points  Repeat phrase 2 points 2 points  Total Score 2 points 2 points    Immunizations Immunization History  Administered Date(s) Administered   Fluad Quad(high Dose 65+) 12/05/2019, 01/03/2021   Influenza, Seasonal, Injecte, Preservative Fre 11/26/2015, 11/21/2016, 11/17/2017   Influenza,inj,Quad PF,6+ Mos 11/24/2022   Influenza-Unspecified 11/26/2015, 11/21/2016, 11/17/2017, 12/05/2021   PFIZER(Purple Top)SARS-COV-2 Vaccination 06/05/2019, 07/03/2019, 01/03/2020, 07/12/2020, 01/21/2021   PNEUMOCOCCAL CONJUGATE-20 12/24/2021   Pneumococcal Conjugate-13 01/20/2018   Pneumococcal Polysaccharide-23 05/07/2010   Pneumococcal-Unspecified 06/02/2015   Tdap 06/02/2015, 01/03/2021   Zoster Recombinant(Shingrix) 05/12/2018, 07/16/2018   Zoster, Live 11/26/2015    TDAP status: Up to date  Flu Vaccine status: Up to date  Pneumococcal vaccine status: Up to date  Covid-19 vaccine status: Declined, Education has been provided regarding the importance of this vaccine but patient still declined. Advised may receive this vaccine at local pharmacy or Health Dept.or vaccine clinic. Aware to provide a copy of the vaccination record  if obtained from local pharmacy or Health Dept. Verbalized acceptance and understanding.  Qualifies for Shingles Vaccine? Yes   Zostavax completed Yes   Shingrix Completed?: Yes  Screening Tests Health Maintenance  Topic Date Due   COVID-19 Vaccine (6 - 2024-25 season) 12/06/2022   INFLUENZA VACCINE  11/05/2023   Lung Cancer Screening  05/19/2024   Medicare Annual Wellness (AWV)  07/14/2024   MAMMOGRAM  01/26/2025   Colonoscopy  09/10/2025   DTaP/Tdap/Td (3 - Td or Tdap) 01/04/2031   Pneumonia Vaccine 41+ Years old  Completed  DEXA SCAN  Completed   Hepatitis C Screening  Completed   Zoster Vaccines- Shingrix  Completed   HPV VACCINES  Aged Out   Meningococcal B Vaccine  Aged Out    Health Maintenance  Health Maintenance Due  Topic Date Due   COVID-19 Vaccine (6 - 2024-25 season) 12/06/2022    Colorectal cancer screening: Type of screening: Colonoscopy. Completed 09/11/2015. Repeat every 10 years  Mammogram status: Completed 01/27/2023. Repeat every year  Bone Density status: Completed 07/23/2021. Results reflect: Bone density results: OSTEOPENIA. Repeat every 2 years.  Lung Cancer Screening: (Low Dose CT Chest recommended if Age 96-80 years, 20 pack-year currently smoking OR have quit w/in 15years.) does qualify.   Lung Cancer Screening Referral: Already in program.   Additional Screening:  Hepatitis C Screening: does qualify; Completed 07/11/2015  Vision Screening: Recommended annual ophthalmology exams for early detection of glaucoma and other disorders of the eye. Is the patient up to date with their annual eye exam?  Yes  Who is the provider or what is the name of the office in which the patient attends annual eye exams? [email or web address] If pt is not established with a provider, would they like to be referred to a provider to establish care?  N/a .   Dental Screening: Recommended annual dental exams for proper oral hygiene    Community Resource Referral / Chronic  Care Management: CRR required this visit?  No   CCM required this visit?  No     Plan:     I have personally reviewed and noted the following in the patient's chart:   Medical and social history Use of alcohol, tobacco or illicit drugs  Current medications and supplements including opioid prescriptions. Patient is not currently taking opioid prescriptions. Functional ability and status Nutritional status Physical activity Advanced directives List of other physicians Hospitalizations none, surgeries x 1, and ER visits in previous 12 months none Vitals Screenings to include cognitive, depression, and falls Referrals and appointments  In addition, I have reviewed and discussed with patient certain preventive protocols, quality metrics, and best practice recommendations. A written personalized care plan for preventive services as well as general preventive health recommendations were provided to patient.     Esmond Harps, CMA   07/15/2023   After Visit Summary: (MyChart) Due to this being a telephonic visit, the after visit summary with patients personalized plan was offered to patient via MyChart   Nurse Notes:   Carolyn [name] is a 74 y.o. female patient of Everrett Coombe, DO who had a Medicare Annual Wellness Visit today via telephone. Carolyn [name] is Retired and lives alone. She has 1 (living) children. She reports that she is socially active and does interact with friends/family regularly. She is minimally physically active and enjoys gardening.

## 2023-07-15 NOTE — Telephone Encounter (Signed)
 Dr. Ashley Royalty, please advise on this for pt.

## 2023-07-15 NOTE — Patient Instructions (Signed)
  Ms. Smart , Thank you for taking time to come for your Medicare Wellness Visit. I appreciate your ongoing commitment to your health goals. Please review the following plan we discussed and let me know if I can assist you in the future.   These are the goals we discussed:  Goals       Exercise 3x per week (30 min per time)      Increase walking to 3 times per week for 30 minutes      Patient Stated (pt-stated)      Patient stated that she would like to be able to get her strength back up after heart surgery.      Weight (lb) < 180 lb (81.6 kg)      She would like to lose weight.         This is a list of the screening recommended for you and due dates:  Health Maintenance  Topic Date Due   COVID-19 Vaccine (6 - 2024-25 season) 12/06/2022   Flu Shot  11/05/2023   Screening for Lung Cancer  05/19/2024   Medicare Annual Wellness Visit  07/14/2024   Mammogram  01/26/2025   Colon Cancer Screening  09/10/2025   DTaP/Tdap/Td vaccine (3 - Td or Tdap) 01/04/2031   Pneumonia Vaccine  Completed   DEXA scan (bone density measurement)  Completed   Hepatitis C Screening  Completed   Zoster (Shingles) Vaccine  Completed   HPV Vaccine  Aged Out   Meningitis B Vaccine  Aged Out

## 2023-07-16 ENCOUNTER — Other Ambulatory Visit: Payer: Self-pay | Admitting: Family Medicine

## 2023-07-16 DIAGNOSIS — Z87410 Personal history of cervical dysplasia: Secondary | ICD-10-CM

## 2023-07-16 DIAGNOSIS — M5412 Radiculopathy, cervical region: Secondary | ICD-10-CM

## 2023-07-20 NOTE — Telephone Encounter (Signed)
 Patient inuring  why she is be referred to [facility]. Please advise.   Thank you

## 2023-07-20 NOTE — Telephone Encounter (Signed)
 Dr Brice Campi are you aware of this clinical study??

## 2023-07-20 NOTE — Telephone Encounter (Signed)
 Spoke with pt and let her know she is being referred to [facility] because [facility] is not covered by her insurance.  Pt states she got a letter from her insurance where something was denied due to coding for a study ?clinical trial that Dr Brice Campi did. Let her know this note would be sent to Dr Mansouraty's nurse to see if he knows what this is regarding. She is aware.

## 2023-07-20 NOTE — Telephone Encounter (Signed)
 I spoke with the pt and advised that she can attach the letter to My Chart or have the insurance send a copy of the letter/ clinical study info so that we can review.  The pt has been advised of the information and verbalized understanding.

## 2023-07-20 NOTE — Telephone Encounter (Signed)
 I did not perform a clinical study or have her in a clinical study. I do not know what was missing or unsupported persea in what is being stated. We need to have a copy of the letter to see what is being stated as an issue. 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 GM

## 2023-07-27 DIAGNOSIS — L570 Actinic keratosis: Secondary | ICD-10-CM | POA: Diagnosis not present

## 2023-07-27 DIAGNOSIS — D225 Melanocytic nevi of trunk: Secondary | ICD-10-CM | POA: Diagnosis not present

## 2023-07-27 DIAGNOSIS — Z85820 Personal history of malignant melanoma of skin: Secondary | ICD-10-CM | POA: Diagnosis not present

## 2023-07-27 DIAGNOSIS — C44619 Basal cell carcinoma of skin of left upper limb, including shoulder: Secondary | ICD-10-CM | POA: Diagnosis not present

## 2023-07-27 DIAGNOSIS — L821 Other seborrheic keratosis: Secondary | ICD-10-CM | POA: Diagnosis not present

## 2023-07-29 NOTE — Progress Notes (Signed)
 Final PancraGEN testing has returned  CEA 209 ng/mL Amylase 55,138 units/L Glucose less than 4.3 mg/dL Cytology = acellular DNA quantity = low quantity Quality = degraded GNAS = high clonality R201H KRAS = high clonality G 12D Tumor suppressor gene LOH = no LOH detected  Based on the fluid analysis, the testing suggest that this is statistically an indolent lesion. Based on this particular testing it is felt that there is a 97% probability of benign disease over the next 3 years.  This is as a result of patient having significant molecular alteration but lack of concerning clinical features.  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  She has had a referral placed to [facility] hepatobiliary surgery as result of insurance issues. We will get these labs scanned into the chart and mailed to her for her records evaluation at [facility].  If patient ends up not having surgery, which is an option in regards to active surveillance, repeat MRI/MRCP should be considered in 6 months by primary gastroenterologist.  Yong Henle, MD [facility] Advanced Endoscopy Office # [phone]

## 2023-07-29 NOTE — Progress Notes (Signed)
 Reminder to call pt in 6 months if surgery not completed.

## 2023-08-03 DIAGNOSIS — C44619 Basal cell carcinoma of skin of left upper limb, including shoulder: Secondary | ICD-10-CM | POA: Diagnosis not present

## 2023-08-05 ENCOUNTER — Encounter: Payer: Self-pay | Admitting: Gastroenterology

## 2023-08-09 ENCOUNTER — Ambulatory Visit: Payer: No Typology Code available for payment source | Admitting: Cardiology

## 2023-08-10 DIAGNOSIS — K3189 Other diseases of stomach and duodenum: Secondary | ICD-10-CM | POA: Diagnosis not present

## 2023-08-10 DIAGNOSIS — B3781 Candidal esophagitis: Secondary | ICD-10-CM | POA: Diagnosis not present

## 2023-08-17 DIAGNOSIS — K862 Cyst of pancreas: Secondary | ICD-10-CM | POA: Diagnosis not present

## 2023-08-17 DIAGNOSIS — K8689 Other specified diseases of pancreas: Secondary | ICD-10-CM | POA: Diagnosis not present

## 2023-08-25 ENCOUNTER — Ambulatory Visit: Payer: Self-pay

## 2023-08-25 NOTE — Telephone Encounter (Signed)
 Chief Complaint: Index finger, left hand Symptoms: swelling, pain, redness, warmth Frequency: x 2 weeks Pertinent Negatives: Patient denies fever, N/V, rash Disposition: [] ED /[] Urgent Care (no appt availability in office) / [x] Appointment(In office/virtual)/ []  [facility]/ [] Home Care/ [] Refused Recommended Disposition /[]  Mobile Bus/ []  Follow-up with PCP Additional Notes: Pt reports she has been experiencing redness and swelling to her left index finger x 2 weeks. She notes it feels tight and intermittently painful at 6/10. Denies fever but notes warmth/redness at site. OV scheduled This RN educated pt on home care, new-worsening symptoms, when to call back/seek emergent care. Pt verbalized understanding and agrees to plan.    Copied from CRM [id number]. Topic: Clinical - Red Word Triage >> Aug 25, 2023  9:02 AM Tisa Forester wrote: Red Word that prompted transfer to Nurse Triage:  left hand index finger and knuckle is swollen and red been going for 2 week Reason for Disposition  Looks like a boil, infected sore, deep ulcer or other infected rash (spreading redness, pus)  Answer Assessment - Initial Assessment Questions 1. ONSET: "When did the pain start?"     X 2 weeks 2. LOCATION: "Where is the pain located?"     Left index finger/knuckle swelling 3. PAIN: "How bad is the pain?" (Scale 1-10; or mild, moderate, severe)   - MILD (1-3): doesn't interfere with normal activities   - MODERATE (4-7): interferes with normal activities (e.g., work or school) or awakens from sleep   - SEVERE (8-10): excruciating pain, unable to use hand at all     Tightness now 6/10 at worst 7. OTHER SYMPTOMS: "Do you have any other symptoms?" (e.g., neck pain, swelling, rash, numbness, fever)     Swelling, knuckle red, warmth  Protocols used: Hand and Wrist Pain-A-AH

## 2023-08-26 ENCOUNTER — Other Ambulatory Visit: Payer: Self-pay | Admitting: Family Medicine

## 2023-08-26 ENCOUNTER — Encounter: Payer: Self-pay | Admitting: Family Medicine

## 2023-08-26 ENCOUNTER — Ambulatory Visit (INDEPENDENT_AMBULATORY_CARE_PROVIDER_SITE_OTHER)

## 2023-08-26 ENCOUNTER — Ambulatory Visit (INDEPENDENT_AMBULATORY_CARE_PROVIDER_SITE_OTHER): Admitting: Family Medicine

## 2023-08-26 VITALS — BP 119/63 | HR 83 | Temp 97.7°F | Ht 65.0 in | Wt 174.1 lb

## 2023-08-26 DIAGNOSIS — M7989 Other specified soft tissue disorders: Secondary | ICD-10-CM | POA: Diagnosis not present

## 2023-08-26 DIAGNOSIS — G2581 Restless legs syndrome: Secondary | ICD-10-CM

## 2023-08-26 DIAGNOSIS — Z1231 Encounter for screening mammogram for malignant neoplasm of breast: Secondary | ICD-10-CM

## 2023-08-26 DIAGNOSIS — M79642 Pain in left hand: Secondary | ICD-10-CM | POA: Diagnosis not present

## 2023-08-26 DIAGNOSIS — M778 Other enthesopathies, not elsewhere classified: Secondary | ICD-10-CM | POA: Diagnosis not present

## 2023-08-26 DIAGNOSIS — E039 Hypothyroidism, unspecified: Secondary | ICD-10-CM

## 2023-08-26 DIAGNOSIS — M25442 Effusion, left hand: Secondary | ICD-10-CM | POA: Diagnosis not present

## 2023-08-26 DIAGNOSIS — M19042 Primary osteoarthritis, left hand: Secondary | ICD-10-CM | POA: Diagnosis not present

## 2023-08-26 DIAGNOSIS — M1812 Unilateral primary osteoarthritis of first carpometacarpal joint, left hand: Secondary | ICD-10-CM | POA: Diagnosis not present

## 2023-08-26 MED ORDER — PREDNISONE 20 MG PO TABS: 40.0000 mg | ORAL_TABLET | Freq: Every day | ORAL | 0 refills | Status: DC

## 2023-08-26 NOTE — Progress Notes (Signed)
   Acute Office Visit  Subjective:     Patient ID: Carolyn [name], female    DOB: 1950/07/04, 73 y.o.   MRN: [id number]  Chief Complaint  Patient presents with   swollen finger    HPI Patient is in today for  Pt reports that her L index finger started swelling and hurting 2 weeks ago.  He says initially it was painful at the PIP joint and then at the DIP joint and then its almost like it seemed to move down into the MCP.  She has tried Tylenol , Icy hot, and ice packs to help with the pain and swelling and nothing has helped.   She denies any injury/trauma/bug bites/stings.   Pt stated that she cannot take any NSAID's due to her heart condition.     Says her Pat GM had RA.   ROS      Objective:    BP 119/63   Pulse 83   Temp 97.7 F (36.5 C) (Oral)   Ht 5\' 5"  (1.651 m)   Wt 174 lb 1.9 oz (79 kg)   SpO2 100%   BMI 28.98 kg/m    Physical Exam Skin:    Comments: She has swelling and bogginess of the MCP at the index and middle finger.  She is also tender over the joint itself.  Nontender of the DIPs and PIPs     No results found for any visits on 08/26/23.      Assessment & Plan:   Problem List Items Addressed This Visit   None Visit Diagnoses       Swollen finger    -  Primary   Relevant Orders   DG Hand Complete Left   Sedimentation rate   C-reactive protein   CYCLIC CITRUL PEPTIDE ANTIBODY, IGG/IGA   ANA   Uric acid   CBC with Differential/Platelet      It really looks swollen tender and boggy.  She does have what looks like most like a little scab or scratch on her finger but she says that is not new that it has been there.  I do not see any significant erythema to indicate infection at least at this point but will get some labs as well as get a plain film on going to go ahead and put her on prednisone  today.  And then once I get those results back if we need to change course we can no fevers or chills.  Meds ordered this encounter  Medications    predniSONE  (DELTASONE ) 20 MG tablet    Sig: Take 2 tablets (40 mg total) by mouth daily with breakfast.    Dispense:  10 tablet    Refill:  0    No follow-ups on file.  Duaine German, MD

## 2023-08-26 NOTE — Progress Notes (Signed)
 Pt reports that her L index finger started swelling and hurting 2 weeks ago. She has tried Tylenol , Icy hot, and ice packs to help with the pain and swelling and nothing has helped.  She denies any injury/trauma/bug bites/stings.  Pt stated that she cannot take any NSAID's due to her heart condition.

## 2023-08-27 ENCOUNTER — Ambulatory Visit: Payer: Self-pay | Admitting: Family Medicine

## 2023-08-27 NOTE — Progress Notes (Signed)
 Hi Carolyn [name], your blood count is normal. No sign of infection.  No anemia. NO sign of gout.  Inflammatory markers ok so far. Other tests are still pending.

## 2023-08-28 LAB — CBC WITH DIFFERENTIAL/PLATELET
Basophils Absolute: 0.1 10*3/uL (ref 0.0–0.2)
Basos: 1 %
EOS (ABSOLUTE): 0.1 10*3/uL (ref 0.0–0.4)
Eos: 1 %
Hematocrit: 46.6 % (ref 34.0–46.6)
Hemoglobin: 14.9 g/dL (ref 11.1–15.9)
Immature Grans (Abs): 0 10*3/uL (ref 0.0–0.1)
Immature Granulocytes: 0 %
Lymphocytes Absolute: 1.3 10*3/uL (ref 0.7–3.1)
Lymphs: 14 %
MCH: 28.8 pg (ref 26.6–33.0)
MCHC: 32 g/dL (ref 31.5–35.7)
MCV: 90 fL (ref 79–97)
Monocytes Absolute: 0.6 10*3/uL (ref 0.1–0.9)
Monocytes: 7 %
Neutrophils Absolute: 7.1 10*3/uL — ABNORMAL HIGH (ref 1.4–7.0)
Neutrophils: 77 %
Platelets: 206 10*3/uL (ref 150–450)
RBC: 5.17 x10E6/uL (ref 3.77–5.28)
RDW: 13 % (ref 11.7–15.4)
WBC: 9.1 10*3/uL (ref 3.4–10.8)

## 2023-08-28 LAB — SEDIMENTATION RATE: Sed Rate: 18 mm/h (ref 0–40)

## 2023-08-28 LAB — C-REACTIVE PROTEIN: CRP: 12 mg/L — ABNORMAL HIGH (ref 0–10)

## 2023-08-28 LAB — ANA: Anti Nuclear Antibody (ANA): POSITIVE — AB

## 2023-08-28 LAB — CYCLIC CITRUL PEPTIDE ANTIBODY, IGG/IGA: Cyclic Citrullin Peptide Ab: 10 U (ref 0–19)

## 2023-08-28 LAB — URIC ACID: Uric Acid: 5 mg/dL (ref 3.0–7.2)

## 2023-08-29 NOTE — Progress Notes (Signed)
 HI Carolyn [name], you have a lot of arthritis at your wrist near your thumb and at the boase of your thumb. You have some mild arthritis at the last joint on your 2nd finger as well. Hopefully your swelling is better, if not please let us  know.

## 2023-09-02 NOTE — Progress Notes (Signed)
 Hi Carolyn [name], your ANA did come back positive so they will send us  some additional breakdown information but it may take another day or 2 for that to come back.

## 2023-09-08 ENCOUNTER — Other Ambulatory Visit: Payer: Self-pay | Admitting: Family Medicine

## 2023-09-08 DIAGNOSIS — M5412 Radiculopathy, cervical region: Secondary | ICD-10-CM

## 2023-09-08 DIAGNOSIS — Z87410 Personal history of cervical dysplasia: Secondary | ICD-10-CM

## 2023-09-13 ENCOUNTER — Ambulatory Visit: Payer: Self-pay

## 2023-09-13 ENCOUNTER — Ambulatory Visit: Payer: Self-pay | Admitting: *Deleted

## 2023-09-13 NOTE — Telephone Encounter (Signed)
 This can wait until he returns later this week.

## 2023-09-13 NOTE — Telephone Encounter (Signed)
 3rd attempt, no answer. Left voicemail for patient to return call for triage.     Reason for Disposition  Third attempt to contact caller AND no contact made. Phone number verified.  Protocols used: No Contact or Duplicate Contact Call-A-AH

## 2023-09-13 NOTE — Telephone Encounter (Addendum)
 2nd attempt went unanswered, left voicemail to return call.   1st attempt went unanswered, left voicemail to return call.   Copied from CRM [id number]. Topic: Clinical - Lab/Test Results >> Sep 13, 2023 12:50 PM Kevelyn M wrote: Reason for CRM: Patient calling back for lab results. Please call the patient back #[phone]

## 2023-09-13 NOTE — Telephone Encounter (Signed)
 Reason for Disposition  [1] Follow-up call to recent contact AND [2] information only call, no triage required  Answer Assessment - Initial Assessment Questions 1. REASON FOR CALL or QUESTION: "What is your reason for calling today?" or "How can I best help you?" or "What question do you have that I can help answer?"     They did some lab work and they said it would take 2 more days get the results.   ANA was what we were waiting for.  I have not heard anything.  She continues to be inflamed and have swelling.  Protocols used: Information Only Call - No Triage-A-AH FYI Only or Action Required?: Action required by provider  Pt calling to follow up on her results from the ANA test.  See message from Dr. Greer Leak dated 09/02/2023 at 7:46 AM.   Pt never heard anything back as far as the breakdown of the ANA.    Patient was last seen in primary care on 08/26/2023 by Cydney Draft, MD. Called Nurse Triage reporting Advice Only. Symptoms began a week ago. Interventions attempted: Nothing. Symptoms are: unchanged.  Triage Disposition: Information or Advice Only Call  Patient/caregiver understands and will follow disposition?:  Yes

## 2023-09-13 NOTE — Telephone Encounter (Signed)
 Forwarded separate message to Dr. Greer Leak. Patient requesting break down of recent ANA positive lab results.

## 2023-09-15 NOTE — Telephone Encounter (Signed)
 FYI

## 2023-09-16 NOTE — Telephone Encounter (Signed)
 This request has been handled. The patient has been updated of the provider's recommendation. Verbalized understanding. The patient will hold off on doing additional labs. No further action is required.

## 2023-09-25 ENCOUNTER — Other Ambulatory Visit: Payer: Self-pay | Admitting: Family Medicine

## 2023-09-25 DIAGNOSIS — J309 Allergic rhinitis, unspecified: Secondary | ICD-10-CM

## 2023-09-27 DIAGNOSIS — H52223 Regular astigmatism, bilateral: Secondary | ICD-10-CM | POA: Diagnosis not present

## 2023-09-28 ENCOUNTER — Other Ambulatory Visit: Payer: Self-pay | Admitting: Family Medicine

## 2023-09-28 NOTE — Telephone Encounter (Unsigned)
 Copied from CRM [id number]. Topic: Clinical - Medication Refill >> Sep 28, 2023 12:05 PM Shamecia H wrote: Medication: pregabalin  (LYRICA ) 75 MG capsule  Has the patient contacted their pharmacy? Yes (Agent: If no, request that the patient contact the pharmacy for the refill. If patient does not wish to contact the pharmacy document the reason why and proceed with request.) (Agent: If yes, when and what did the pharmacy advise?)  This is the patient's preferred pharmacy:  [id number] [place], [place] - [address] [place] [facility] [address] Phone: [phone] Fax: [phone]  Is this the correct pharmacy for this prescription? Yes If no, delete pharmacy and type the correct one.   Has the prescription been filled recently? Yes  Is the patient out of the medication? No  Has the patient been seen for an appointment in the last year OR does the patient have an upcoming appointment? Yes  Can we respond through MyChart? Yes  Agent: Please be advised that Rx refills may take up to 3 business days. We ask that you follow-up with your pharmacy.

## 2023-09-29 MED ORDER — PREGABALIN 75 MG PO CAPS: 75.0000 mg | ORAL_CAPSULE | Freq: Every day | ORAL | 5 refills | Status: DC

## 2023-10-23 ENCOUNTER — Other Ambulatory Visit: Payer: Self-pay | Admitting: Family Medicine

## 2023-10-23 DIAGNOSIS — I700 Atherosclerosis of aorta: Secondary | ICD-10-CM

## 2023-11-07 ENCOUNTER — Other Ambulatory Visit: Payer: Self-pay | Admitting: Family Medicine

## 2023-11-07 DIAGNOSIS — Z87410 Personal history of cervical dysplasia: Secondary | ICD-10-CM

## 2023-11-07 DIAGNOSIS — M5412 Radiculopathy, cervical region: Secondary | ICD-10-CM

## 2023-11-09 NOTE — Telephone Encounter (Signed)
 Hold for 11/16/23 appt.

## 2023-11-10 ENCOUNTER — Other Ambulatory Visit: Payer: Self-pay | Admitting: Family Medicine

## 2023-11-10 DIAGNOSIS — Z87410 Personal history of cervical dysplasia: Secondary | ICD-10-CM

## 2023-11-10 DIAGNOSIS — M5412 Radiculopathy, cervical region: Secondary | ICD-10-CM

## 2023-11-10 NOTE — Telephone Encounter (Signed)
 Copied from CRM #[id number]. Topic: Clinical - Medication Refill >> Nov 10, 2023 11:45 AM Cherylann RAMAN wrote: Medication: clonazePAM  (KLONOPIN ) 0.5 MG tablet   Patient is going out of town on and will return on Monday 08/11  Has the patient contacted their pharmacy? Yes (Agent: If no, request that the patient contact the pharmacy for the refill. If patient does not wish to contact the pharmacy document the reason why and proceed with request.) (Agent: If yes, when and what did the pharmacy advise?) Controlled Substance  This is the patient's preferred pharmacy:  [id number] [place], [place] - [address] [place] [facility] [address] Phone: [phone] Fax: [phone]  Is this the correct pharmacy for this prescription? Yes If no, delete pharmacy and type the correct one.   Has the prescription been filled recently? No  Is the patient out of the medication? No, 2 days remaining  Has the patient been seen for an appointment in the last year OR does the patient have an upcoming appointment? Yes  Can we respond through MyChart? Yes  Agent: Please be advised that Rx refills may take up to 3 business days. We ask that you follow-up with your pharmacy.

## 2023-11-12 MED ORDER — CLONAZEPAM 0.5 MG PO TABS: ORAL_TABLET | ORAL | 0 refills | Status: DC

## 2023-11-12 NOTE — Telephone Encounter (Signed)
 Forwarding message to Zada Palin covering Dr. Alvia Requesting rx rf of  clonazepam  0.5mg  Last written 07/15/2023 Last OV 08/26/2023 Dr. Alvan  05/19/2023 with Dr. Alvia Upcoming appt 11/16/2023

## 2023-11-16 ENCOUNTER — Encounter: Payer: Self-pay | Admitting: Family Medicine

## 2023-11-16 ENCOUNTER — Ambulatory Visit: Payer: No Typology Code available for payment source | Admitting: Family Medicine

## 2023-11-16 VITALS — BP 122/71 | HR 70 | Resp 20 | Ht 65.0 in | Wt 173.0 lb

## 2023-11-16 DIAGNOSIS — J439 Emphysema, unspecified: Secondary | ICD-10-CM | POA: Diagnosis not present

## 2023-11-16 DIAGNOSIS — G2581 Restless legs syndrome: Secondary | ICD-10-CM | POA: Diagnosis not present

## 2023-11-16 DIAGNOSIS — M810 Age-related osteoporosis without current pathological fracture: Secondary | ICD-10-CM | POA: Diagnosis not present

## 2023-11-16 DIAGNOSIS — E039 Hypothyroidism, unspecified: Secondary | ICD-10-CM

## 2023-11-16 DIAGNOSIS — F331 Major depressive disorder, recurrent, moderate: Secondary | ICD-10-CM

## 2023-11-16 DIAGNOSIS — Z13220 Encounter for screening for lipoid disorders: Secondary | ICD-10-CM | POA: Diagnosis not present

## 2023-11-16 DIAGNOSIS — Z952 Presence of prosthetic heart valve: Secondary | ICD-10-CM | POA: Diagnosis not present

## 2023-11-16 DIAGNOSIS — M2550 Pain in unspecified joint: Secondary | ICD-10-CM | POA: Diagnosis not present

## 2023-11-16 DIAGNOSIS — R768 Other specified abnormal immunological findings in serum: Secondary | ICD-10-CM

## 2023-11-16 DIAGNOSIS — M5412 Radiculopathy, cervical region: Secondary | ICD-10-CM

## 2023-11-16 DIAGNOSIS — F3341 Major depressive disorder, recurrent, in partial remission: Secondary | ICD-10-CM

## 2023-11-16 MED ORDER — MELOXICAM 15 MG PO TABS: 15.0000 mg | ORAL_TABLET | Freq: Every day | ORAL | 0 refills | Status: DC

## 2023-11-16 NOTE — Assessment & Plan Note (Signed)
 Overall she is doing quite well.    No anginal symptoms.  Continues to see cardiology.

## 2023-11-16 NOTE — Assessment & Plan Note (Signed)
 She will continue Breo at current strength, generic ordered..  Albuterol as needed.  Encourage smoking cessation.

## 2023-11-16 NOTE — Assessment & Plan Note (Signed)
 Feels good with current levothyroxine strength.  Update TSH.

## 2023-11-16 NOTE — Assessment & Plan Note (Signed)
 Continued joint pain and stiffness.  Previously had positive ANA.  Will order additional labs for titers.  I do meloxicam  as needed for joint pain.  She may continue Tylenol  as well.

## 2023-11-16 NOTE — Progress Notes (Signed)
 Carolyn [name] - 72 y.o. female MRN [id number]  Date of birth: 04-11-1950  Subjective Chief Complaint  Patient presents with   Follow-up    6 month. Hypothyroidism    HPI Carolyn [name] is a 73 y.o. female here today for follow up visit.   She reports she is doing okay.  She continues to have some joint pain and stiffness.  Previously had positive ANA but did not reflex to titers.  She is taking Tylenol  twice a day but is curious if she can take anything additional for pain control.  RLS and neuropathy symptoms are fairly stable with Requip  and Lyrica .  Tolerating medication well without side effects.  Feels good with current strength of levothyroxine .  She has been tolerating atorvastatin  well for management of hyperlipidemia history of aortic atherosclerosis.  Continues to see cardiology for history of aortic valve replacement.  Denies chest pain or dyspnea.  ROS:  A comprehensive ROS was completed and negative except as noted per HPI  Allergies  Allergen Reactions   Trazodone  And Nefazodone Shortness Of Breath   Azithromycin Rash    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   right shoulder blade   Neuromuscular disorder (HCC)    Restless Leg Syndrome   Pneumonia 2019   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   left nare   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   left  upper shin   SCC (squamous cell carcinoma) in situ 03/19/2016   right pinky   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   right thigh, left upper shin   SCC (squamous cell carcinoma) well diff 12/13/2008   left hand   SCC (squamous cell carcinoma) well diff x2 12/25/2015   left and right forearm   Thyroid  disease     Past Surgical History:  Procedure Laterality Date   ANKLE SURGERY Right 2000   repaired tendons with pin placed   AORTIC VALVE REPLACEMENT N/A 06/16/2022   Procedure: AORTIC VALVE REPLACEMENT (AVR) USING EDWARDS INSPIRIS SIZE ;  Surgeon: Lucas Dorise POUR, MD;  Location: [facility] OR;  Service: Open Heart Surgery;  Laterality: N/A;   CERVICAL CONIZATION W/BX     COLONOSCOPY  2014   ESOPHAGOGASTRODUODENOSCOPY N/A 06/28/2023   Procedure: EGD (ESOPHAGOGASTRODUODENOSCOPY);  Surgeon: Wilhelmenia Aloha Raddle., MD;  Location: [facility];  Service: Gastroenterology;  Laterality: N/A;   EUS N/A 06/28/2023   Procedure: UPPER ENDOSCOPIC ULTRASOUND (EUS) RADIAL;  Surgeon: Wilhelmenia Aloha Raddle., MD;  Location: [facility];  Service: Gastroenterology;  Laterality: N/A;   EYE SURGERY Bilateral    Cataracts   FINE NEEDLE ASPIRATION  06/28/2023   Procedure: FINE NEEDLE ASPIRATION;  Surgeon: Wilhelmenia Aloha Raddle., MD;  Location: [facility];  Service: Gastroenterology;;   MALIGNANT SKIN LESION EXCISION Left  04/06/2023   MELANOMA EXCISION  08/2014   back   RIGHT HEART CATH AND CORONARY ANGIOGRAPHY N/A 06/02/2022   Procedure: RIGHT HEART CATH AND CORONARY ANGIOGRAPHY;  Surgeon: Wonda Sharper, MD;  [facility] INVASIVE CV LAB;  Service: Cardiovascular;  Laterality: N/A;   SHOULDER SURGERY Right    x2   SHOULDER SURGERY Left    TEE WITHOUT CARDIOVERSION N/A 06/16/2022   Procedure: TRANSESOPHAGEAL ECHOCARDIOGRAM;  Surgeon: Lucas Dorise POUR, MD;  Location: [facility] OR;  Service: Open Heart Surgery;  Laterality: N/A;   THUMB ARTHROSCOPY Right    Removed bone in thumb    Social  History   Socioeconomic History   Marital status: Divorced    Spouse name: Not on file   Number of children: 2   Years of education: GED   Highest education level: GED or equivalent  Occupational History   Occupation: disabled   Occupation: retired  Tobacco Use   Smoking status: Every Day    Current packs/day: 0.50    Average packs/day: 0.5 packs/day for 55.6 years (27.8 ttl pk-yrs)    Types: Cigarettes    Start date: 04/06/1968    Passive exposure: Never   Smokeless tobacco: Never  Vaping Use   Vaping status: Never Used  Substance and Sexual Activity   Alcohol use: No   Drug use: No   Sexual activity: Not Currently    Partners: Male    Birth control/protection: None  Other Topics Concern   Not on file  Social History Narrative   Lives alone. She had two children, one is deceased. Her son lives in [place]. She has a sister who lives close by who helps her a lot. Her enjoys gardening.    Social Drivers of Health   Financial Resource Strain: Medium Risk (11/14/2023)   Overall Financial Resource Strain (CARDIA)    Difficulty of Paying Living Expenses: Somewhat hard  Food Insecurity: No Food Insecurity (11/14/2023)   Hunger Vital Sign    Worried About Running Out of Food in the Last Year: Never true    Ran Out of Food in the Last Year: Never true  Transportation Needs: No Transportation Needs (11/14/2023)   PRAPARE - Administrator, Civil Service (Medical): No    Lack of Transportation (Non-Medical): No  Physical Activity: Inactive (11/14/2023)   Exercise Vital Sign    Days of Exercise per Week: 0 days    Minutes of Exercise per Session: Not on file  Stress: No Stress Concern Present (11/14/2023)   [employer] of Occupational Health - Occupational Stress Questionnaire    Feeling of Stress: Only a little  Social Connections: Moderately Integrated (11/14/2023)   Social Connection and Isolation Panel    Frequency of Communication with Friends and Family:  More than three times a week    Frequency of Social Gatherings with Friends and Family: Once a week    Attends Religious Services: More than 4 times per year    Active Member of [employer] or Organizations: Yes    Attends Engineer, structural: More than 4 times per year    Marital Status: Divorced    Family History  Problem Relation Age of Onset   Heart disease Mother    Aneurysm Mother    Heart disease Father    Kidney failure Father    Cancer Sister    COPD Sister    Cancer Brother    Cirrhosis Daughter    Cancer Sister  Bronchial    Health Maintenance  Topic Date Due   COVID-19 Vaccine (6 - 2024-25 season) 12/06/2022   INFLUENZA VACCINE  11/05/2023   Lung Cancer Screening  05/19/2024   Medicare Annual Wellness (AWV)  07/14/2024   MAMMOGRAM  01/26/2025   Colonoscopy  09/10/2025   DTaP/Tdap/Td (3 - Td or Tdap) 01/04/2031   Pneumococcal Vaccine: 50+ Years  Completed   DEXA SCAN  Completed   Hepatitis C Screening  Completed   Zoster Vaccines- Shingrix   Completed   Hepatitis B Vaccines  Aged Out   HPV VACCINES  Aged Out   Meningococcal B Vaccine  Aged Out     ----------------------------------------------------------------------------------------------------------------------------------------------------------------------------------------------------------------- Physical Exam BP 122/71 (BP Location: Left Arm, Patient Position: Sitting, Cuff Size: Normal)   Pulse 70   Resp 20   Ht 5' 5 (1.651 m)   Wt 173 lb (78.5 kg)   SpO2 100%   BMI 28.79 kg/m   Physical Exam Constitutional:      Appearance: Normal appearance.  Eyes:     General: No scleral icterus. Cardiovascular:     Rate and Rhythm: Normal rate and regular rhythm.  Pulmonary:     Effort: Pulmonary effort is normal.     Breath sounds: Normal breath sounds.  Musculoskeletal:     Cervical back: Neck supple.  Neurological:     Mental Status: She is alert.  Psychiatric:        Mood and  Affect: Mood normal.        Behavior: Behavior normal.     ------------------------------------------------------------------------------------------------------------------------------------------------------------------------------------------------------------------- Assessment and Plan  Hypothyroidism Feels good with current levothyroxine  strength.  Update TSH.  S/P aortic valve replacement Overall she is doing quite well.    No anginal symptoms.  Continues to see cardiology.  COPD (chronic obstructive pulmonary disease) (HCC) She will continue Breo at current strength, generic ordered..  Albuterol  as needed.  Encourage smoking cessation.  Polyarthralgia Continued joint pain and stiffness.  Previously had positive ANA.  Will order additional labs for titers.  I do meloxicam  as needed for joint pain.  She may continue Tylenol  as well.   Meds ordered this encounter  Medications   meloxicam  (MOBIC ) 15 MG tablet    Sig: Take 1 tablet (15 mg total) by mouth daily.    Dispense:  30 tablet    Refill:  0    Return in about 6 months (around 05/18/2024), or Thyroid , arthritis.

## 2023-11-18 LAB — CBC WITH DIFFERENTIAL/PLATELET
Basophils Absolute: 0.1 x10E3/uL (ref 0.0–0.2)
Basos: 1 %
EOS (ABSOLUTE): 0 x10E3/uL (ref 0.0–0.4)
Eos: 0 %
Hematocrit: 43.2 % (ref 34.0–46.6)
Hemoglobin: 14.2 g/dL (ref 11.1–15.9)
Immature Grans (Abs): 0 x10E3/uL (ref 0.0–0.1)
Immature Granulocytes: 0 %
Lymphocytes Absolute: 1.2 x10E3/uL (ref 0.7–3.1)
Lymphs: 15 %
MCH: 30 pg (ref 26.6–33.0)
MCHC: 32.9 g/dL (ref 31.5–35.7)
MCV: 91 fL (ref 79–97)
Monocytes Absolute: 0.5 x10E3/uL (ref 0.1–0.9)
Monocytes: 7 %
Neutrophils Absolute: 5.9 x10E3/uL (ref 1.4–7.0)
Neutrophils: 77 %
Platelets: 194 x10E3/uL (ref 150–450)
RBC: 4.73 x10E6/uL (ref 3.77–5.28)
RDW: 13.4 % (ref 11.7–15.4)
WBC: 7.7 x10E3/uL (ref 3.4–10.8)

## 2023-11-18 LAB — CMP14+EGFR
ALT: 17 IU/L (ref 0–32)
AST: 29 IU/L (ref 0–40)
Albumin: 4.2 g/dL (ref 3.9–4.9)
Alkaline Phosphatase: 87 IU/L (ref 44–121)
BUN/Creatinine Ratio: 16 (ref 12–28)
BUN: 12 mg/dL (ref 8–27)
Bilirubin Total: 0.5 mg/dL (ref 0.0–1.2)
CO2: 23 mmol/L (ref 20–29)
Calcium: 9.4 mg/dL (ref 8.7–10.3)
Chloride: 97 mmol/L (ref 96–106)
Creatinine, Ser: 0.77 mg/dL (ref 0.57–1.00)
Globulin, Total: 2.2 g/dL (ref 1.5–4.5)
Glucose: 99 mg/dL (ref 70–99)
Potassium: 4.3 mmol/L (ref 3.5–5.2)
Sodium: 136 mmol/L (ref 134–144)
Total Protein: 6.4 g/dL (ref 6.0–8.5)
eGFR: 83 mL/min/1.73 (ref >59)

## 2023-11-18 LAB — ANA,IFA RA DIAG PNL W/RFLX TIT/PATN
ANA Titer 1: NEGATIVE
Cyclic Citrullin Peptide Ab: 4 U (ref 0–19)
Rheumatoid fact SerPl-aCnc: 10.3 [IU]/mL (ref <14.0)

## 2023-11-18 LAB — LIPID PANEL WITH LDL/HDL RATIO
Cholesterol, Total: 141 mg/dL (ref 100–199)
HDL: 71 mg/dL (ref >39)
LDL Chol Calc (NIH): 56 mg/dL (ref 0–99)
LDL/HDL Ratio: 0.8 ratio (ref 0.0–3.2)
Triglycerides: 72 mg/dL (ref 0–149)
VLDL Cholesterol Cal: 14 mg/dL (ref 5–40)

## 2023-11-18 LAB — TSH: TSH: 2.78 u[IU]/mL (ref 0.450–4.500)

## 2023-11-18 LAB — FERRITIN: Ferritin: 73 ng/mL (ref 15–150)

## 2023-11-23 NOTE — Assessment & Plan Note (Signed)
 Stable at this time

## 2023-11-26 ENCOUNTER — Ambulatory Visit: Payer: Self-pay | Admitting: Family Medicine

## 2023-12-07 ENCOUNTER — Encounter: Payer: Self-pay | Admitting: Sports Medicine

## 2023-12-13 ENCOUNTER — Other Ambulatory Visit: Payer: Self-pay | Admitting: Family Medicine

## 2023-12-13 DIAGNOSIS — M5412 Radiculopathy, cervical region: Secondary | ICD-10-CM

## 2023-12-13 DIAGNOSIS — Z87410 Personal history of cervical dysplasia: Secondary | ICD-10-CM

## 2023-12-13 MED ORDER — ALBUTEROL SULFATE HFA 108 (90 BASE) MCG/ACT IN AERS: 2.0000 | INHALATION_SPRAY | Freq: Four times a day (QID) | RESPIRATORY_TRACT | 2 refills | Status: DC | PRN

## 2023-12-13 NOTE — Telephone Encounter (Unsigned)
 Copied from CRM [id number]. Topic: Clinical - Medication Refill >> Dec 13, 2023  8:00 AM Shamecia H wrote: Medication: clonazePAM  (KLONOPIN ) 0.5 MG tablet, albuterol  (VENTOLIN  HFA) 108 (90 Base) MCG/ACT inhaler  Has the patient contacted their pharmacy? Yes (Agent: If no, request that the patient contact the pharmacy for the refill. If patient does not wish to contact the pharmacy document the reason why and proceed with request.) (Agent: If yes, when and what did the pharmacy advise?)  This is the patient's preferred pharmacy:  [id number] [place] [address], [place] - [address] [place] [address] [facility] [address] Phone: [phone] Fax: [phone]  Is this the correct pharmacy for this prescription? Yes If no, delete pharmacy and type the correct one.   Has the prescription been filled recently? No  Is the patient out of the medication? Yes  Has the patient been seen for an appointment in the last year OR does the patient have an upcoming appointment? Yes  Can we respond through MyChart? Yes  Agent: Please be advised that Rx refills may take up to 3 business days. We ask that you follow-up with your pharmacy.

## 2023-12-14 ENCOUNTER — Other Ambulatory Visit: Payer: Self-pay

## 2023-12-14 DIAGNOSIS — Z87410 Personal history of cervical dysplasia: Secondary | ICD-10-CM

## 2023-12-14 DIAGNOSIS — M5412 Radiculopathy, cervical region: Secondary | ICD-10-CM

## 2023-12-14 NOTE — Telephone Encounter (Signed)
 Requesting rx rf of clonazepam  0.5mg  Last written 08/082025 Last OV 08/12/225 Upcoming appt 05/23/2024

## 2023-12-14 NOTE — Addendum Note (Signed)
 Addended by: Khalia Gong P on: 12/14/2023 04:27 PM   Modules accepted: Orders

## 2023-12-14 NOTE — Telephone Encounter (Signed)
 Pt requesting refill of clonazepam . States that pharm told her she needs to call the doctor for every refill.   Copied from CRM [id number]. Topic: Clinical - Red Word Triage >> Dec 14, 2023  8:37 AM Miquel SAILOR wrote: Red Word that prompted transfer to Nurse Triage: clonazePAM  (KLONOPIN ) 0.5 MG tablet-Patient is out of medication needs refill ASAP

## 2023-12-15 ENCOUNTER — Telehealth: Payer: Self-pay

## 2023-12-15 NOTE — Telephone Encounter (Signed)
 Copied from CRM [id number]. Topic: Clinical - Medication Refill >> Dec 13, 2023  8:00 AM Shamecia H wrote: Medication: clonazePAM  (KLONOPIN ) 0.5 MG tablet, albuterol  (VENTOLIN  HFA) 108 (90 Base) MCG/ACT inhaler  Has the patient contacted their pharmacy? Yes (Agent: If no, request that the patient contact the pharmacy for the refill. If patient does not wish to contact the pharmacy document the reason why and proceed with request.) (Agent: If yes, when and what did the pharmacy advise?)  This is the patient's preferred pharmacy:  [id number] [place], [place] - [address] [place] [facility] [address] Phone: [phone] Fax: [phone]  Is this the correct pharmacy for this prescription? Yes If no, delete pharmacy and type the correct one.   Has the prescription been filled recently? No  Is the patient out of the medication? Yes  Has the patient been seen for an appointment in the last year OR does the patient have an upcoming appointment? Yes  Can we respond through MyChart? Yes  Agent: Please be advised that Rx refills may take up to 3 business days. We ask that you follow-up with your pharmacy. >> Dec 15, 2023  2:04 PM Fredrica W wrote: Patient called. Checked status of refill request. Called [facility]. Let patient know request sent to provider for review. Thank You >> Dec 15, 2023  9:35 AM Graeme ORN wrote: Patient called back. States she has been calling for 3 days. Checking status of Clonazepam . Still Pending. Let her know it was sent to PCP to review. She would like someone to call her back about status and why it is taking so long. Has not been able to sleep. Thank You

## 2023-12-15 NOTE — Telephone Encounter (Signed)
 Message sent to Dr. Alvia with this request in a separate message.

## 2024-01-08 ENCOUNTER — Other Ambulatory Visit: Payer: Self-pay | Admitting: Family Medicine

## 2024-01-08 ENCOUNTER — Other Ambulatory Visit: Payer: Self-pay | Admitting: Gastroenterology

## 2024-01-09 ENCOUNTER — Other Ambulatory Visit: Payer: Self-pay | Admitting: Family Medicine

## 2024-01-11 ENCOUNTER — Other Ambulatory Visit: Payer: Self-pay

## 2024-01-11 DIAGNOSIS — K862 Cyst of pancreas: Secondary | ICD-10-CM

## 2024-01-11 NOTE — Progress Notes (Signed)
 Order placed, rad scheduling to contact pt to set up appt.

## 2024-01-24 ENCOUNTER — Ambulatory Visit (INDEPENDENT_AMBULATORY_CARE_PROVIDER_SITE_OTHER)

## 2024-01-24 DIAGNOSIS — K862 Cyst of pancreas: Secondary | ICD-10-CM | POA: Diagnosis not present

## 2024-01-24 MED ORDER — GADOBUTROL 1 MMOL/ML IV SOLN
8.0000 mL | Freq: Once | INTRAVENOUS | Status: AC | PRN
  Administered 2024-01-24: 8 mL via INTRAVENOUS

## 2024-01-27 ENCOUNTER — Ambulatory Visit: Payer: Self-pay | Admitting: Gastroenterology

## 2024-01-27 NOTE — Progress Notes (Signed)
 Carolyn [name],  Your MRI did not show any significant changes from the previous study.  This is good news.  We will fax these results to your surgeon, Dr. Cathern, for his review as well.  I imagine we will likely plan to repeat an MRI in 1-2 years.  Team,  Can you please share the results of the MRI with the patient's surgeon at [facility], Dr. Nelwyn Cathern (Fax [phone])? Please place recall for repeat MRCP in 1 year.  Thanks

## 2024-02-02 ENCOUNTER — Ambulatory Visit

## 2024-02-02 DIAGNOSIS — Z13820 Encounter for screening for osteoporosis: Secondary | ICD-10-CM

## 2024-02-02 DIAGNOSIS — M810 Age-related osteoporosis without current pathological fracture: Secondary | ICD-10-CM

## 2024-02-02 DIAGNOSIS — M8589 Other specified disorders of bone density and structure, multiple sites: Secondary | ICD-10-CM

## 2024-02-07 ENCOUNTER — Ambulatory Visit

## 2024-02-07 ENCOUNTER — Ambulatory Visit: Payer: Self-pay

## 2024-02-07 ENCOUNTER — Ambulatory Visit
Admission: EM | Admit: 2024-02-07 | Discharge: 2024-02-07 | Disposition: A | Discharge status: Home / Self Care | Attending: Family Medicine | Admitting: Family Medicine

## 2024-02-07 ENCOUNTER — Telehealth: Payer: Self-pay

## 2024-02-07 ENCOUNTER — Encounter: Payer: Self-pay | Admitting: Emergency Medicine

## 2024-02-07 DIAGNOSIS — J441 Chronic obstructive pulmonary disease with (acute) exacerbation: Secondary | ICD-10-CM | POA: Diagnosis not present

## 2024-02-07 DIAGNOSIS — J40 Bronchitis, not specified as acute or chronic: Secondary | ICD-10-CM

## 2024-02-07 MED ORDER — PREDNISONE 20 MG PO TABS: 40.0000 mg | ORAL_TABLET | Freq: Every day | ORAL | 0 refills | Status: DC

## 2024-02-07 MED ORDER — LEVOFLOXACIN 500 MG PO TABS: 500.0000 mg | ORAL_TABLET | Freq: Every day | ORAL | 0 refills | Status: DC

## 2024-02-07 MED ORDER — METHYLPREDNISOLONE SODIUM SUCC 125 MG IJ SOLR
80.0000 mg | Freq: Once | INTRAMUSCULAR | Status: AC
  Administered 2024-02-07: 80 mg via INTRAMUSCULAR

## 2024-02-07 MED ORDER — IPRATROPIUM-ALBUTEROL 0.5-2.5 (3) MG/3ML IN SOLN
3.0000 mL | Freq: Once | RESPIRATORY_TRACT | Status: AC
  Administered 2024-02-07: 3 mL via RESPIRATORY_TRACT

## 2024-02-07 NOTE — Telephone Encounter (Signed)
 FYI Only or Action Required?: FYI only for provider: appointment scheduled on today.  Patient was last seen in primary care on 11/16/2023 by Alvia Bring, DO.  Called Nurse Triage reporting Shortness of Breath. Using her inhaler a lot. She gets winded very easily  Symptoms began a week ago.  Interventions attempted: Other: Pt has some prednisone  that she started taking.  Symptoms are: gradually worsening.  Triage Disposition: See HCP Within 4 Hours (Or PCP Triage)  Patient/caregiver understands and will follow disposition?: Yes                   Copied from CRM [id number]. Topic: Clinical - Red Word Triage >> Feb 07, 2024  8:51 AM Rosaria A wrote: Red Word that prompted transfer to Nurse Triage: Patient called in for an appointment for bronchitis and possible pneumonia. All appointments for the office was for 8:50am today. I offered patient appointments in the group region but patient did not want to go. Patient wanted an appointment for tomorrow. After scheduling appointment patient states that she is wheezing and just want to sleep, she has been taking bottles of medication. Reason for Disposition  [1] MILD difficulty breathing (e.g., minimal/no SOB at rest, SOB with walking, pulse < 100) AND [2] NEW-onset or WORSE than normal  Answer Assessment - Initial Assessment Questions 1. RESPIRATORY STATUS: Describe your breathing? (e.g., wheezing, shortness of breath, unable to speak, severe coughing)      Can't walk across the floor 2. ONSET: When did this breathing problem begin?      Cough last week 3. PATTERN Does the difficult breathing come and go, or has it been constant since it started?      Comes and goes 4. SEVERITY: How bad is your breathing? (e.g., mild, moderate, severe)      moderate 5. RECURRENT SYMPTOM: Have you had difficulty breathing before? If Yes, ask: When was the last time? and What happened that time?      yes 6. CARDIAC HISTORY: Do  you have any history of heart disease? (e.g., heart attack, angina, bypass surgery, angioplasty)      Valve replaced 7. LUNG HISTORY: Do you have any history of lung disease?  (e.g., pulmonary embolus, asthma, emphysema)     In case of SOB 8. CAUSE: What do you think is causing the breathing problem?       9. OTHER SYMPTOMS: Do you have any other symptoms? (e.g., chest pain, cough, dizziness, fever, runny nose)     Had a pain in her heart last week  Protocols used: Breathing Difficulty-A-AH

## 2024-02-07 NOTE — Discharge Instructions (Signed)
 You have received a shot of steroid today.  Start your oral steroids tomorrow morning.  Take 40 mg a day for 5 days Take Levaquin  once a day for a week Continue with your usual inhalers See your doctor if not improving towards the end of the week Make sure you are drinking lots of water.  Get plenty of rest

## 2024-02-07 NOTE — ED Triage Notes (Addendum)
 Pt presents to UC with c/o, runny nose, cough and shortness of breath. States symptoms began last week. Took 20 mg of prednisone  yesterday from a leftover prescription. States she feels she has bronchitis.

## 2024-02-07 NOTE — ED Provider Notes (Addendum)
 [name]    CSN: [id number] Arrival date & time: 02/07/24  0955      History   Chief Complaint Chief Complaint  Patient presents with   Cough   Shortness of Breath    HPI Carolyn [name] is a 73 y.o. female.   Patient has known COPD smoker.  She states that she has been around 2 family members that have pneumonia.  She has a cough, congestion, shortness of breath, chest pain.  No fever or chills.  She OPD inhalers.  She has been using those.  She has increased shortness of breath in spite of this.  She feels like she has bronchitis.    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   right shoulder blade   Neuromuscular disorder (HCC)    Restless Leg Syndrome   Pneumonia 2019   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   left nare   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   left upper shin   SCC (squamous cell carcinoma) in situ 03/19/2016   right pinky   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   right thigh, left upper shin   SCC (squamous cell carcinoma) well diff 12/13/2008   left hand   SCC (squamous cell carcinoma) well diff x2 12/25/2015   left and right forearm   Thyroid  disease     Patient Active Problem List   Diagnosis Date Noted   Polyarthralgia 11/16/2023   Dilated pancreatic duct 06/28/2023   Pancreatic cyst 06/28/2023   Mucosal  abnormality of esophagus 06/28/2023   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   COPD (chronic obstructive pulmonary disease) (HCC) 11/21/2022   Nocturnal hypoxia 11/21/2022   S/P AVR 07/01/2022   S/P aortic valve replacement 06/16/2022   Severe aortic stenosis 06/01/2022   Unstable angina (HCC) 06/01/2022   Plantar fasciitis, right 05/21/2022   Nocturnal oxygen desaturation 05/18/2022   Cervical radiculitis 11/16/2021   Hearing loss due to cerumen impaction, right 06/23/2021   Insomnia 04/08/2021   Tobacco abuse 02/10/2017   Peripheral neuropathic pain 08/04/2016   Pulmonary nodule 07/15/2016   Constipation 04/13/2016   Aortic atherosclerosis 01/13/2016   COPD exacerbation (HCC) 01/13/2016   MDD (major depressive disorder), recurrent, 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   GAD (generalized anxiety disorder) 07/11/2015   Allergic rhinitis 07/11/2015   Hypothyroidism 07/11/2015   Restless leg syndrome 07/11/2015   Atypical glandular cells of undetermined significance (AGUS) on cervical Pap smear 11/06/2013   Postmenopausal state 10/09/2013   History of cervical dysplasia 10/09/2013    Past Surgical History:  Procedure Laterality Date   ANKLE SURGERY Right 2000   repaired tendons with pin placed   AORTIC VALVE REPLACEMENT N/A 06/16/2022   Procedure: AORTIC VALVE REPLACEMENT (AVR) USING EDWARDS INSPIRIS  SIZE ;  Surgeon: Lucas Dorise POUR, MD;  Location: [facility] OR;  Service: Open Heart Surgery;  Laterality: N/A;   CERVICAL CONIZATION W/BX     COLONOSCOPY  2014   ESOPHAGOGASTRODUODENOSCOPY N/A 06/28/2023   Procedure: EGD (ESOPHAGOGASTRODUODENOSCOPY);  Surgeon: Wilhelmenia Aloha Raddle., MD;  Location: [facility];  Service: Gastroenterology;  Laterality: N/A;   EUS N/A 06/28/2023   Procedure: UPPER ENDOSCOPIC ULTRASOUND (EUS) RADIAL;  Surgeon: Wilhelmenia Aloha Raddle., MD;  Location: [facility];  Service: Gastroenterology;  Laterality: N/A;   EYE SURGERY Bilateral     Cataracts   FINE NEEDLE ASPIRATION  06/28/2023   Procedure: FINE NEEDLE ASPIRATION;  Surgeon: Wilhelmenia Aloha Raddle., MD;  Location: [facility];  Service: Gastroenterology;;   MALIGNANT SKIN LESION EXCISION Left 04/06/2023   MELANOMA EXCISION  08/2014   back   RIGHT HEART CATH AND CORONARY ANGIOGRAPHY N/A 06/02/2022   Procedure: RIGHT HEART CATH AND CORONARY ANGIOGRAPHY;  Surgeon: Wonda Sharper, MD;  Location: [facility] INVASIVE CV LAB;  Service: Cardiovascular;  Laterality: N/A;   SHOULDER SURGERY Right    x2   SHOULDER SURGERY Left    TEE WITHOUT CARDIOVERSION N/A 06/16/2022   Procedure: TRANSESOPHAGEAL ECHOCARDIOGRAM;  Surgeon: Lucas Dorise POUR, MD;  Location: [facility] OR;  Service: Open Heart Surgery;  Laterality: N/A;   THUMB ARTHROSCOPY Right    Removed bone in thumb    OB History     Gravida  3   Para  2   Term  2   Preterm  0   AB  1   Living  2      SAB  1   IAB      Ectopic      Multiple      Live Births  2            Home Medications    Prior to Admission medications   Medication Sig Start Date End Date Taking? Authorizing Provider  levofloxacin  (LEVAQUIN ) 500 MG tablet Take 1 tablet (500 mg total) by mouth daily. 02/07/24  Yes Maranda Jamee Jacob, MD  predniSONE  (DELTASONE ) 20 MG tablet Take 2 tablets (40 mg total) by mouth daily with breakfast. 02/07/24  Yes Maranda Jamee Jacob, MD  albuterol  (VENTOLIN  HFA) 108 (90 Base) MCG/ACT inhaler Inhale 2 puffs into the lungs every 6 (six) hours as needed for wheezing or shortness of breath. 12/13/23   Alvia Bring, DO  AMBULATORY NON FORMULARY MEDICATION Home health:  Physical therapy-Evaluate and treat, Occupational therapy-Evaluate and treat, Nursing-Teaching on heart healthy diet, medications, 11/24/22   Alvia Bring, DO  AMBULATORY NON FORMULARY MEDICATION Please provide home O2 to be use at bedtime.  Flow 2L/Min. 11/25/22   Alvia Bring, DO  ascorbic acid (VITAMIN C) 500 MG tablet Take 500 mg by mouth daily.     [provider]  aspirin  81 MG EC tablet TAKE 1 TABLET BY MOUTH EVERY DAY Patient taking differently: Take 81 mg by mouth daily. 11/11/18   Joshua Clayborne RAMAN, PA-C  atorvastatin  (LIPITOR) 10 MG tablet TAKE 1 TABLET BY MOUTH EVERY DAY 10/25/23   Alvia Bring, DO  baclofen  (LIORESAL ) 10 MG tablet TAKE 1 TABLET BY MOUTH THREE TIMES A DAY AS NEEDED FOR MUSCLE SPASM 09/08/23   Willo Mini, NP  Calcium  Carb-Cholecalciferol (CALCIUM  600 + D PO) Take 1 tablet by mouth 2 (two) times daily.    [provider]  Cholecalciferol (DIALYVITE VITAMIN D 5000) 125 MCG (5000 UT) capsule Take 5,000 Units by mouth daily.  [provider]  clonazePAM  (KLONOPIN ) 0.5 MG tablet TAKE 1 TABLET BY MOUTH EVERYDAY AT BEDTIME 12/15/23   Alvia Bring, DO  fluticasone  (FLONASE ) 50 MCG/ACT nasal spray SPRAY 1 SPRAY INTO EACH NOSTRIL EVERY DAY 09/27/23   Alvia Bring, DO  Garlic 1000 MG CAPS Take 1,000 mg by mouth daily.    [provider]  levothyroxine  (SYNTHROID ) 112 MCG tablet TAKE 1 TABLET BY MOUTH DAILY BEFORE BREAKFAST. 08/26/23   Alvia Bring, DO  magnesium  hydroxide (MILK OF MAGNESIA) 400 MG/5ML suspension Take 15 mLs by mouth daily as needed for mild constipation.    [provider]  meloxicam  (MOBIC ) 15 MG tablet TAKE 1 TABLET (15 MG TOTAL) BY MOUTH DAILY. 01/10/24   Alvia Bring, DO  Multiple Vitamins-Minerals (CENTRUM SILVER ADULT 50+ PO) Take 1 tablet by mouth daily.    [provider]  Omega-3 Fatty Acids (FISH OIL) 1200 MG CAPS Take 1,200 mg by mouth daily.    [provider]  pantoprazole  (PROTONIX ) 40 MG tablet TAKE 1 TABLET (40 MG TOTAL) BY MOUTH TWICE A DAY BEFORE MEALS 01/11/24   Mansouraty, Aloha Raddle., MD  pregabalin  (LYRICA ) 75 MG capsule Take 1 capsule (75 mg total) by mouth at bedtime. 09/29/23   Alvia Bring, DO  rOPINIRole  (REQUIP ) 3 MG tablet TAKE 1 TABLET BY MOUTH EVERYDAY AT BEDTIME 08/26/23   Alvia Bring, DO  WIXELA INHUB 250-50 MCG/ACT  AEPB INHALE 1 PUFF INTO THE LUNGS IN THE MORNING AND AT BEDTIME. 01/10/24   Alvia Bring, DO  zinc gluconate 50 MG tablet Take 50 mg by mouth daily.    [provider]    Family History Family History  Problem Relation Age of Onset   Heart disease Mother    Aneurysm Mother    Heart disease Father    Kidney failure Father    Cancer Sister    COPD Sister    Cancer Brother    Cirrhosis Daughter    Cancer Sister        Bronchial    Social History Social History   Tobacco Use   Smoking status: Every Day    Current packs/day: 0.50    Average packs/day: 0.5 packs/day for 55.8 years (27.9 ttl pk-yrs)    Types: Cigarettes    Start date: 04/06/1968    Passive exposure: Never   Smokeless tobacco: Never  Vaping Use   Vaping status: Never Used  Substance Use Topics   Alcohol use: No   Drug use: No     Allergies   Trazodone  and nefazodone and Azithromycin   Review of Systems Review of Systems See HPI  Physical Exam Triage Vital Signs ED Triage Vitals  Encounter Vitals Group     BP 02/07/24 1010 124/82     Girls Systolic BP Percentile --      Girls Diastolic BP Percentile --      Boys Systolic BP Percentile --      Boys Diastolic BP Percentile --      Pulse Rate 02/07/24 1010 82     Resp 02/07/24 1010 17     Temp 02/07/24 1010 98.3 F (36.8 C)     Temp Source 02/07/24 1010 Oral     SpO2 02/07/24 1010 97 %     Weight --      Height --      Head Circumference --      Peak Flow --      Pain Score 02/07/24 1009 0  Pain Loc --      Pain Education --      Exclude from Growth Chart --    No data found.  Updated Vital Signs BP 124/82 (BP Location: Right Arm)   Pulse 82   Temp 98.3 F (36.8 C) (Oral)   Resp 17   SpO2 97%     Physical Exam Constitutional:      General: She is not in acute distress.    Appearance: She is well-developed. She is ill-appearing.     Comments: Appears tired.  Mildly dyspneic with conversation  HENT:     Head:  Normocephalic and atraumatic.  Eyes:     Conjunctiva/sclera: Conjunctivae normal.     Pupils: Pupils are equal, round, and reactive to light.  Cardiovascular:     Rate and Rhythm: Normal rate.  Pulmonary:     Effort: Pulmonary effort is normal. No respiratory distress.     Breath sounds: Examination of the right-upper field reveals wheezing. Examination of the left-upper field reveals wheezing. Examination of the right-middle field reveals wheezing. Examination of the left-middle field reveals wheezing. Examination of the right-lower field reveals wheezing. Examination of the left-lower field reveals wheezing. Wheezing present.  Chest:     Chest wall: No mass or tenderness.     Comments: Well-healed sternotomy scar Abdominal:     General: There is no distension.     Palpations: Abdomen is soft.  Musculoskeletal:        General: Normal range of motion.     Cervical back: Normal range of motion.  Skin:    General: Skin is warm and dry.  Neurological:     Mental Status: She is alert.      UC Treatments / Results  Labs (all labs ordered are listed, but only abnormal results are displayed) Labs Reviewed - No data to display  EKG   Radiology DG Chest 2 View Result Date: 02/07/2024 EXAM: 2 VIEW(S) XRAY OF THE CHEST 02/07/2024 11:08:00 AM COMPARISON: 10/08/2022 CLINICAL HISTORY: copd, cough FINDINGS: LUNGS AND PLEURA: Hyperinflation. Linear scarring in left midlung. Nodular opacity in left midlung this is unchanged when compared to 10/08/2022. On the CT from 05/20/2023 this corresponds to an area of pleural calcification. Chronic Blunting of left costophrenic angle. SABRA No pulmonary edema. No pneumothorax. HEART AND MEDIASTINUM: Aortic valve replacement noted. No acute abnormality of the cardiac and mediastinal silhouettes. BONES AND SOFT TISSUES: Median sternotomy noted. No acute osseous abnormality. IMPRESSION: 1. Hyperinflation with linear scarring  in the left midlung. Electronically  signed by: Waddell Calk MD 02/07/2024 01:06 PM EST RP Workstation: [id number]    Procedures Procedures (including critical care time)  Medications Ordered in UC Medications  ipratropium-albuterol  (DUONEB) 0.5-2.5 (3) MG/3ML nebulizer solution 3 mL (3 mLs Nebulization Given 02/07/24 1044)  methylPREDNISolone  sodium succinate (SOLU-MEDROL ) 125 mg/2 mL injection 80 mg (80 mg Intramuscular Given 02/07/24 1138)    Initial Impression / Assessment and Plan / UC Course  I have reviewed the triage vital signs and the nursing notes.  Pertinent labs & imaging results that were available during my care of the patient were reviewed by me and considered in my medical decision making (see chart for details).     Patient is given a DuoNeb treatment.  After treatment she still has wheezing throughout, right greater than left.  Chest x-ray was performed to rule out pneumonia.  My reading it is negative.  I will call her if anything shows up.  I did give her a  shot of Medrol , she states this helped her in the past.  Chest x-ray is reviewed.  No acute cardiopulmonary disease.  Patient notified Final Clinical Impressions(s) / UC Diagnoses   Final diagnoses:  COPD exacerbation (HCC)  Bronchitis     Discharge Instructions      You have received a shot of steroid today.  Start your oral steroids tomorrow morning.  Take 40 mg a day for 5 days Take Levaquin  once a day for a week Continue with your usual inhalers See your doctor if not improving towards the end of the week Make sure you are drinking lots of water.  Get plenty of rest     ED Prescriptions     Medication Sig Dispense Auth. Provider   levofloxacin  (LEVAQUIN ) 500 MG tablet Take 1 tablet (500 mg total) by mouth daily. 7 tablet Maranda Jamee Jacob, MD   predniSONE  (DELTASONE ) 20 MG tablet Take 2 tablets (40 mg total) by mouth daily with breakfast. 10 tablet Maranda Jamee Jacob, MD      PDMP not reviewed this encounter.   Maranda Jamee Jacob, MD 02/07/24 1136    Maranda Jamee Jacob, MD 02/07/24 1325

## 2024-02-07 NOTE — Telephone Encounter (Signed)
 Please cancel appt for tomorrow. Pt has appt for today.

## 2024-02-08 ENCOUNTER — Other Ambulatory Visit: Payer: Self-pay | Admitting: Family Medicine

## 2024-02-08 ENCOUNTER — Ambulatory Visit (HOSPITAL_COMMUNITY): Payer: Self-pay

## 2024-02-08 ENCOUNTER — Ambulatory Visit: Admitting: Family Medicine

## 2024-02-08 ENCOUNTER — Ambulatory Visit: Admitting: Urgent Care

## 2024-02-17 ENCOUNTER — Ambulatory Visit (INDEPENDENT_AMBULATORY_CARE_PROVIDER_SITE_OTHER)

## 2024-02-17 DIAGNOSIS — Z1231 Encounter for screening mammogram for malignant neoplasm of breast: Secondary | ICD-10-CM

## 2024-02-19 ENCOUNTER — Other Ambulatory Visit: Payer: Self-pay | Admitting: Medical-Surgical

## 2024-02-19 DIAGNOSIS — Z87410 Personal history of cervical dysplasia: Secondary | ICD-10-CM

## 2024-02-19 DIAGNOSIS — M5412 Radiculopathy, cervical region: Secondary | ICD-10-CM

## 2024-02-26 ENCOUNTER — Other Ambulatory Visit: Payer: Self-pay | Admitting: Family Medicine

## 2024-03-06 ENCOUNTER — Encounter: Payer: Self-pay | Admitting: Family Medicine

## 2024-03-06 ENCOUNTER — Ambulatory Visit: Admitting: Family Medicine

## 2024-03-06 VITALS — BP 136/82 | HR 70 | Ht 65.0 in | Wt 181.0 lb

## 2024-03-06 DIAGNOSIS — M5412 Radiculopathy, cervical region: Secondary | ICD-10-CM

## 2024-03-06 DIAGNOSIS — I700 Atherosclerosis of aorta: Secondary | ICD-10-CM | POA: Diagnosis not present

## 2024-03-06 DIAGNOSIS — G2581 Restless legs syndrome: Secondary | ICD-10-CM

## 2024-03-06 DIAGNOSIS — J439 Emphysema, unspecified: Secondary | ICD-10-CM

## 2024-03-06 DIAGNOSIS — Z87410 Personal history of cervical dysplasia: Secondary | ICD-10-CM | POA: Diagnosis not present

## 2024-03-06 DIAGNOSIS — E039 Hypothyroidism, unspecified: Secondary | ICD-10-CM

## 2024-03-06 DIAGNOSIS — F411 Generalized anxiety disorder: Secondary | ICD-10-CM

## 2024-03-06 DIAGNOSIS — J449 Chronic obstructive pulmonary disease, unspecified: Secondary | ICD-10-CM

## 2024-03-06 DIAGNOSIS — Z720 Tobacco use: Secondary | ICD-10-CM

## 2024-03-06 MED ORDER — ATORVASTATIN CALCIUM 20 MG PO TABS: 20.0000 mg | ORAL_TABLET | Freq: Every day | ORAL | 2 refills | Status: AC

## 2024-03-06 MED ORDER — LEVOTHYROXINE SODIUM 112 MCG PO TABS: 112.0000 ug | ORAL_TABLET | Freq: Every day | ORAL | 1 refills | Status: AC

## 2024-03-06 MED ORDER — FLUTICASONE-SALMETEROL 250-50 MCG/ACT IN AEPB: 1.0000 | INHALATION_SPRAY | Freq: Two times a day (BID) | RESPIRATORY_TRACT | 6 refills | Status: AC

## 2024-03-06 MED ORDER — ROPINIROLE HCL 3 MG PO TABS: ORAL_TABLET | ORAL | 1 refills | Status: AC

## 2024-03-06 MED ORDER — CLONAZEPAM 0.5 MG PO TABS: ORAL_TABLET | ORAL | 3 refills | Status: AC

## 2024-03-06 MED ORDER — PREGABALIN 75 MG PO CAPS: 75.0000 mg | ORAL_CAPSULE | Freq: Every day | ORAL | 1 refills | Status: AC

## 2024-03-06 NOTE — Assessment & Plan Note (Signed)
Continue to counsel on smoking cessation.  She has no interest in quitting at this time.

## 2024-03-06 NOTE — Assessment & Plan Note (Signed)
 Feels good with current levothyroxine  strength.  Recommend continuation of levothyroxine .

## 2024-03-06 NOTE — Assessment & Plan Note (Signed)
 Recommend smoking cessation.

## 2024-03-06 NOTE — Assessment & Plan Note (Signed)
 Continues to do well with Lyrica  and Requip .  Will plan to continue.

## 2024-03-06 NOTE — Progress Notes (Signed)
 Carolyn [name] - 73 y.o. female MRN [id number]  Date of birth: 1950-11-13  Subjective Chief Complaint  Patient presents with   Hypertension    HPI Carolyn [name] is a 73 y.o. female here today for follow up visit.   She reports that she is doing well.   She remains on Wixela daily with albuterol  as needed.  Continues to smoke.  Efforts to quit have not been successful.    Using lyrica  and requp for RLS symptoms.  Stable at this time.   Taking levothyroxine  as directed.  TSH in 11/2023 was normal.   Occasional use of clonazepam  for anxiety.  No side effects from this.    ROS:  A comprehensive ROS was completed and negative except as noted per HPI   Allergies  Allergen Reactions   Trazodone  And Nefazodone Shortness Of Breath   Azithromycin Rash    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   right shoulder blade   Neuromuscular disorder (HCC)    Restless Leg Syndrome   Pneumonia 2019   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   left nare   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   left upper shin   SCC (squamous cell carcinoma) in situ 03/19/2016   right pinky   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   right thigh, left upper shin   SCC (squamous cell carcinoma) well diff 12/13/2008   left hand   SCC (squamous cell carcinoma) well diff x2 12/25/2015   left  and right forearm   Thyroid  disease     Past Surgical History:  Procedure Laterality Date   ANKLE SURGERY Right 2000   repaired tendons with pin placed   AORTIC VALVE REPLACEMENT N/A 06/16/2022   Procedure: AORTIC VALVE REPLACEMENT (AVR) USING EDWARDS INSPIRIS SIZE ;  Surgeon: Lucas Dorise POUR, MD;  Location: [facility] OR;  Service: Open Heart Surgery;  Laterality: N/A;   CERVICAL CONIZATION W/BX     COLONOSCOPY  2014   ESOPHAGOGASTRODUODENOSCOPY N/A 06/28/2023   Procedure: EGD (ESOPHAGOGASTRODUODENOSCOPY);  Surgeon: Wilhelmenia Aloha Raddle., MD;  Location: [facility];  Service: Gastroenterology;  Laterality: N/A;   EUS N/A 06/28/2023   Procedure: UPPER ENDOSCOPIC ULTRASOUND (EUS) RADIAL;  Surgeon: Wilhelmenia Aloha Raddle., MD;  Location: [facility];  Service: Gastroenterology;  Laterality: N/A;   EYE SURGERY Bilateral    Cataracts   FINE NEEDLE ASPIRATION  06/28/2023   Procedure: FINE NEEDLE ASPIRATION;  Surgeon: Wilhelmenia Aloha Raddle., MD;  Location: [facility];  Service: Gastroenterology;;   MALIGNANT SKIN LESION EXCISION Left 04/06/2023   MELANOMA EXCISION  08/2014   back   RIGHT HEART CATH AND CORONARY ANGIOGRAPHY N/A 06/02/2022   Procedure: RIGHT HEART CATH AND CORONARY ANGIOGRAPHY;  Surgeon: Wonda Sharper, MD;  Location: [facility] INVASIVE  CV LAB;  Service: Cardiovascular;  Laterality: N/A;   SHOULDER SURGERY Right    x2   SHOULDER SURGERY Left    TEE WITHOUT CARDIOVERSION N/A 06/16/2022   Procedure: TRANSESOPHAGEAL ECHOCARDIOGRAM;  Surgeon: Lucas Dorise POUR, MD;  Location: [facility] OR;  Service: Open Heart Surgery;  Laterality: N/A;   THUMB ARTHROSCOPY Right    Removed bone in thumb    Social History   Socioeconomic History   Marital status: Divorced    Spouse name: Not on file   Number of children: 2   Years of education: GED   Highest education level: GED or equivalent  Occupational History   Occupation: disabled   Occupation: retired  Tobacco Use   Smoking status: Every  Day    Current packs/day: 0.50    Average packs/day: 0.5 packs/day for 55.9 years (28.0 ttl pk-yrs)    Types: Cigarettes    Start date: 04/06/1968    Passive exposure: Never   Smokeless tobacco: Never  Vaping Use   Vaping status: Never Used  Substance and Sexual Activity   Alcohol use: No   Drug use: No   Sexual activity: Not Currently    Partners: Male    Birth control/protection: None  Other Topics Concern   Not on file  Social History Narrative   Lives alone. She had two children, one is deceased. Her son lives in [place]. She has a sister who lives close by who helps her a lot. Her enjoys gardening.    Social Drivers of Corporate Investment Banker Strain: Low Risk  (03/02/2024)   Overall Financial Resource Strain (CARDIA)    Difficulty of Paying Living Expenses: Not very hard  Food Insecurity: No Food Insecurity (03/02/2024)   Hunger Vital Sign    Worried About Running Out of Food in the Last Year: Never true    Ran Out of Food in the Last Year: Never true  Transportation Needs: No Transportation Needs (03/02/2024)   PRAPARE - Administrator, Civil Service (Medical): No    Lack of Transportation (Non-Medical): No  Physical Activity: Inactive (03/02/2024)   Exercise Vital Sign    Days of Exercise per Week: 0 days    Minutes of Exercise per Session: Not on file  Stress: No Stress Concern Present (03/02/2024)   [employer] of Occupational Health - Occupational Stress Questionnaire    Feeling of Stress: Not at all  Social Connections: Moderately Integrated (03/02/2024)   Social Connection and Isolation Panel    Frequency of Communication with Friends and Family: More than three times a week    Frequency of Social Gatherings with Friends and Family: Once a week    Attends Religious Services: More than 4 times per year    Active Member of [employer] or Organizations: Yes    Attends Banker Meetings: 1 to 4 times per year    Marital Status:  Divorced    Family History  Problem Relation Age of Onset   Heart disease Mother    Aneurysm Mother    Heart disease Father    Kidney failure Father    Cancer Sister    COPD Sister    Cancer Brother    Cirrhosis Daughter    Cancer Sister        Bronchial    Health Maintenance  Topic Date Due   COVID-19 Vaccine (6 - 2025-26 season) 03/22/2025 (Originally 12/06/2023)   Lung Cancer Screening  05/19/2024   Medicare Annual Wellness (AWV)  07/14/2024   Colonoscopy  09/10/2025   Mammogram  02/16/2026   DTaP/Tdap/Td (3 - Td or Tdap) 01/04/2031   Pneumococcal Vaccine: 50+ Years  Completed   Influenza Vaccine  Completed   Bone Density Scan  Completed   Hepatitis C Screening  Completed   Zoster Vaccines- Shingrix   Completed   Meningococcal B Vaccine  Aged Out     ----------------------------------------------------------------------------------------------------------------------------------------------------------------------------------------------------------------- Physical Exam BP 136/82 (BP Location: Left Arm, Patient Position: Sitting, Cuff Size: Large)   Pulse 70   Ht 5' 5 (1.651 m)   Wt 181 lb (82.1 kg)   SpO2 95%   BMI 30.12 kg/m   Physical Exam Constitutional:      Appearance: Normal appearance.  HENT:     Head: Normocephalic and atraumatic.  Eyes:     General: No scleral icterus. Cardiovascular:     Rate and Rhythm: Normal rate and regular rhythm.  Pulmonary:     Effort: Pulmonary effort is normal.     Breath sounds: Normal breath sounds.  Musculoskeletal:     Cervical back: Neck supple.  Neurological:     Mental Status: She is alert.  Psychiatric:        Mood and Affect: Mood normal.        Behavior: Behavior normal.     ------------------------------------------------------------------------------------------------------------------------------------------------------------------------------------------------------------------- Assessment and  Plan  Hypothyroidism Feels good with current levothyroxine  strength.  Recommend continuation of levothyroxine .   Restless leg syndrome Continues to do well with Lyrica  and Requip .  Will plan to continue.   Aortic atherosclerosis Recommend smoking cessation  GAD (generalized anxiety disorder) Doing well with clonazepam  as needed.  Will continue at current strength.   Tobacco abuse Continue to counsel on smoking cessation.  She has no interest in quitting at this time.  COPD (chronic obstructive pulmonary disease) (HCC) Continue Wixela with albuterol  as needed.    Meds ordered this encounter  Medications   atorvastatin  (LIPITOR) 20 MG tablet    Sig: Take 1 tablet (20 mg total) by mouth daily.    Dispense:  90 tablet    Refill:  2   clonazePAM  (KLONOPIN ) 0.5 MG tablet    Sig: TAKE 1 TABLET BY MOUTH EVERYDAY AT BEDTIME    Dispense:  30 tablet    Refill:  3    Not to exceed 2 additional fills before 01/11/2024 DX Code Needed  .   levothyroxine  (SYNTHROID ) 112 MCG tablet    Sig: Take 1 tablet (112 mcg total) by mouth daily before breakfast.    Dispense:  90 tablet    Refill:  1   pregabalin  (LYRICA ) 75 MG capsule    Sig: Take 1 capsule (75 mg total) by mouth at bedtime.    Dispense:  30 capsule    Refill:  1    This request is for a new prescription for a controlled substance as required by Federal/State law..   rOPINIRole  (REQUIP ) 3 MG tablet    Sig: TAKE 1 TABLET BY MOUTH EVERYDAY AT BEDTIME    Dispense:  90 tablet    Refill:  1   fluticasone -salmeterol (WIXELA INHUB) 250-50 MCG/ACT AEPB    Sig: Inhale 1 puff into the lungs 2 (two) times daily. in the morning and at bedtime.    Dispense:  60 each    Refill:  6    No follow-ups on file.

## 2024-03-06 NOTE — Assessment & Plan Note (Signed)
 Doing well with clonazepam  as needed.  Will continue at current strength.

## 2024-03-06 NOTE — Assessment & Plan Note (Signed)
 Continue Wixela with albuterol  as needed.

## 2024-03-07 ENCOUNTER — Other Ambulatory Visit: Payer: Self-pay | Admitting: Family Medicine

## 2024-03-24 ENCOUNTER — Other Ambulatory Visit: Payer: Self-pay | Admitting: Family Medicine

## 2024-03-27 ENCOUNTER — Encounter: Payer: Self-pay | Admitting: Family Medicine

## 2024-03-27 ENCOUNTER — Ambulatory Visit: Admitting: Family Medicine

## 2024-03-27 VITALS — BP 150/76 | HR 76 | Temp 97.6°F | Ht 65.0 in | Wt 184.0 lb

## 2024-03-27 DIAGNOSIS — J441 Chronic obstructive pulmonary disease with (acute) exacerbation: Secondary | ICD-10-CM | POA: Diagnosis not present

## 2024-03-27 MED ORDER — METHYLPREDNISOLONE SODIUM SUCC 125 MG IJ SOLR
125.0000 mg | Freq: Once | INTRAMUSCULAR | Status: AC
  Administered 2024-03-27: 125 mg via INTRAMUSCULAR

## 2024-03-27 MED ORDER — PREDNISONE 20 MG PO TABS: 20.0000 mg | ORAL_TABLET | Freq: Two times a day (BID) | ORAL | 0 refills | Status: AC

## 2024-03-27 MED ORDER — LEVOFLOXACIN 500 MG PO TABS: 500.0000 mg | ORAL_TABLET | Freq: Every day | ORAL | 0 refills | Status: AC

## 2024-03-27 NOTE — Assessment & Plan Note (Signed)
 Starting levaquin  with burst of prednisone .  Levaquin  as worked better for her historically and she has tolerated well.  Smoking cessation encouraged.  Continue current inhalers.  Red flags reviewed.

## 2024-03-27 NOTE — Progress Notes (Signed)
 " Carolyn [name] - 73 y.o. female MRN [id number]  Date of birth: 1950/08/31  Subjective Chief Complaint  Patient presents with   Cough   Wheezing   Shortness of Breath    HPI Carolyn [name] is a 73 y.o. female here today with complaint of cough, congestion and wheezing.  Symptoms started over the weekend.  Worsened over this past weekend. She does have history of COPD and is a current smoker. She is using inhalers as directed, limited relief with these.  Cough is productive of thick sputum.  Denies fever or chills.  Levaquin  as worked well for this in the past.  ROS:  A comprehensive ROS was completed and negative except as noted per HPI  Allergies[1]  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   right shoulder blade   Neuromuscular disorder (HCC)    Restless Leg Syndrome   Pneumonia 2019   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   left nare   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   left upper shin   SCC (squamous cell carcinoma) in situ 03/19/2016   right pinky   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   right thigh, left upper shin   SCC (squamous cell carcinoma) well diff 12/13/2008   left hand   SCC (squamous cell carcinoma) well diff x2 12/25/2015   left and right forearm   Thyroid  disease     Past Surgical History:  Procedure Laterality Date   ANKLE  SURGERY Right 2000   repaired tendons with pin placed   AORTIC VALVE REPLACEMENT N/A 06/16/2022   Procedure: AORTIC VALVE REPLACEMENT (AVR) USING EDWARDS INSPIRIS SIZE ;  Surgeon: Lucas Dorise POUR, MD;  Location: [facility] OR;  Service: Open Heart Surgery;  Laterality: N/A;   CERVICAL CONIZATION W/BX     COLONOSCOPY  2014   ESOPHAGOGASTRODUODENOSCOPY N/A 06/28/2023   Procedure: EGD (ESOPHAGOGASTRODUODENOSCOPY);  Surgeon: Wilhelmenia Aloha Raddle., MD;  Location: [facility];  Service: Gastroenterology;  Laterality: N/A;   EUS N/A 06/28/2023   Procedure: UPPER ENDOSCOPIC ULTRASOUND (EUS) RADIAL;  Surgeon: Wilhelmenia Aloha Raddle., MD;  Location: [facility];  Service: Gastroenterology;  Laterality: N/A;   EYE SURGERY Bilateral    Cataracts   FINE NEEDLE ASPIRATION  06/28/2023   Procedure: FINE NEEDLE ASPIRATION;  Surgeon: Wilhelmenia Aloha Raddle., MD;  Location: [facility];  Service: Gastroenterology;;   MALIGNANT SKIN LESION EXCISION Left 04/06/2023   MELANOMA EXCISION  08/2014   back   RIGHT HEART CATH AND CORONARY ANGIOGRAPHY N/A 06/02/2022   Procedure: RIGHT HEART CATH AND CORONARY ANGIOGRAPHY;  Surgeon: Wonda Sharper, MD;  Location: [facility] INVASIVE CV LAB;  Service: Cardiovascular;  Laterality: N/A;   SHOULDER SURGERY Right    x2   SHOULDER SURGERY Left    TEE  WITHOUT CARDIOVERSION N/A 06/16/2022   Procedure: TRANSESOPHAGEAL ECHOCARDIOGRAM;  Surgeon: Lucas Dorise POUR, MD;  Location: [facility] OR;  Service: Open Heart Surgery;  Laterality: N/A;   THUMB ARTHROSCOPY Right    Removed bone in thumb    Social History   Socioeconomic History   Marital status: Divorced    Spouse name: Not on file   Number of children: 2   Years of education: GED   Highest education level: GED or equivalent  Occupational History   Occupation: disabled   Occupation: retired  Tobacco Use   Smoking status: Every Day    Current packs/day: 0.50    Average packs/day: 0.5 packs/day for 56.0 years (28.0 ttl pk-yrs)     Types: Cigarettes    Start date: 04/06/1968    Passive exposure: Never   Smokeless tobacco: Never  Vaping Use   Vaping status: Never Used  Substance and Sexual Activity   Alcohol use: No   Drug use: No   Sexual activity: Not Currently    Partners: Male    Birth control/protection: None  Other Topics Concern   Not on file  Social History Narrative   Lives alone. She had two children, one is deceased. Her son lives in [place]. She has a sister who lives close by who helps her a lot. Her enjoys gardening.    Social Drivers of Health   Tobacco Use: High Risk (03/27/2024)   Patient History    Smoking Tobacco Use: Every Day    Smokeless Tobacco Use: Never    Passive Exposure: Never  Financial Resource Strain: Low Risk (03/02/2024)   Overall Financial Resource Strain (CARDIA)    Difficulty of Paying Living Expenses: Not very hard  Food Insecurity: No Food Insecurity (03/02/2024)   Epic    Worried About Programme Researcher, Broadcasting/film/video in the Last Year: Never true    Ran Out of Food in the Last Year: Never true  Transportation Needs: No Transportation Needs (03/02/2024)   Epic    Lack of Transportation (Medical): No    Lack of Transportation (Non-Medical): No  Physical Activity: Inactive (03/02/2024)   Exercise Vital Sign    Days of Exercise per Week: 0 days    Minutes of Exercise per Session: Not on file  Stress: No Stress Concern Present (03/02/2024)   [employer] of Occupational Health - Occupational Stress Questionnaire    Feeling of Stress: Not at all  Social Connections: Moderately Integrated (03/02/2024)   Social Connection and Isolation Panel    Frequency of Communication with Friends and Family: More than three times a week    Frequency of Social Gatherings with Friends and Family: Once a week    Attends Religious Services: More than 4 times per year    Active Member of Clubs or Organizations: Yes    Attends Banker Meetings: 1 to 4 times per year    Marital  Status: Divorced  Depression (PHQ2-9): Low Risk (03/06/2024)   Depression (PHQ2-9)    PHQ-2 Score: 0  Alcohol Screen: Low Risk (03/02/2024)   Alcohol Screen    Last Alcohol Screening Score (AUDIT): 0  Housing: Low Risk (03/02/2024)   Epic    Unable to Pay for Housing in the Last Year: No    Number of Times Moved in the Last Year: 0    Homeless in the Last Year: No  Utilities: Not At Risk (07/15/2023)   AHC Utilities    Threatened with loss of utilities: No  Health Literacy:  Adequate Health Literacy (07/15/2023)   B1300 Health Literacy    Frequency of need for help with medical instructions: Never    Family History  Problem Relation Age of Onset   Heart disease Mother    Aneurysm Mother    Heart disease Father    Kidney failure Father    Cancer Sister    COPD Sister    Cancer Brother    Cirrhosis Daughter    Cancer Sister        Bronchial    Health Maintenance  Topic Date Due   COVID-19 Vaccine (6 - 2025-26 season) 03/22/2025 (Originally 12/06/2023)   Lung Cancer Screening  05/19/2024   Medicare Annual Wellness (AWV)  07/14/2024   Colonoscopy  09/10/2025   Mammogram  02/16/2026   DTaP/Tdap/Td (3 - Td or Tdap) 01/04/2031   Pneumococcal Vaccine: 50+ Years  Completed   Influenza Vaccine  Completed   Bone Density Scan  Completed   Hepatitis C Screening  Completed   Zoster Vaccines- Shingrix   Completed   Meningococcal B Vaccine  Aged Out     ----------------------------------------------------------------------------------------------------------------------------------------------------------------------------------------------------------------- Physical Exam BP (!) 150/76 (BP Location: Left Arm, Patient Position: Sitting, Cuff Size: Normal)   Pulse 76   Temp 97.6 F (36.4 C) (Oral)   Ht 5' 5 (1.651 m)   Wt 184 lb (83.5 kg)   SpO2 97%   BMI 30.62 kg/m   Physical Exam Constitutional:      Appearance: She is well-developed.  Eyes:     General: No scleral  icterus. Cardiovascular:     Rate and Rhythm: Normal rate and regular rhythm.  Pulmonary:     Effort: Pulmonary effort is normal.     Breath sounds: Wheezing (Diffuse, bilateral.) present.  Musculoskeletal:     Cervical back: Neck supple.  Neurological:     General: No focal deficit present.     Mental Status: She is alert.  Psychiatric:        Mood and Affect: Mood normal.        Behavior: Behavior normal.     ------------------------------------------------------------------------------------------------------------------------------------------------------------------------------------------------------------------- Assessment and Plan  COPD exacerbation (HCC) Starting levaquin  with burst of prednisone .  Levaquin  as worked better for her historically and she has tolerated well.  Smoking cessation encouraged.  Continue current inhalers.  Red flags reviewed.    Meds ordered this encounter  Medications   predniSONE  (DELTASONE ) 20 MG tablet    Sig: Take 1 tablet (20 mg total) by mouth 2 (two) times daily with a meal for 5 days.    Dispense:  10 tablet    Refill:  0   levofloxacin  (LEVAQUIN ) 500 MG tablet    Sig: Take 1 tablet (500 mg total) by mouth daily.    Dispense:  7 tablet    Refill:  0    No follow-ups on file.        [1]  Allergies Allergen Reactions   Trazodone  And Nefazodone Shortness Of Breath   Azithromycin Rash   "

## 2024-03-27 NOTE — Patient Instructions (Signed)
 Chronic Obstructive Pulmonary Disease Exacerbation  Chronic obstructive pulmonary disease (COPD) is a long-term (chronic) lung problem. When you have COPD, it can feel harder to breathe in or out. COPD exacerbation is a flare-up of symptoms when breathing gets worse and more treatment may be needed. Without treatment, flare-ups can be life-threatening. If they happen often, your lungs can become more damaged. What are the causes? Not taking your usual COPD medicines as told by your health care provider. A cold or the flu, which can cause infection in your lungs. Being exposed to things that make your breathing worse, such as: Smoke. Air pollution. Fumes. Dust. Allergies. Weather changes. What are the signs or symptoms? Symptoms do not get better or get worse even if you take your medicines as told by your provider. Symptoms may include: More shortness of breath. You may only be able to speak one or two words at a time. More coughing or mucus from your lungs. More wheezing or chest tightness. Being more tired and having less energy. Confusion. How is this diagnosed? This condition is diagnosed based on: Symptoms that get worse. Your medical history. A physical exam. You may also have tests, including: A chest X-ray. Blood or mucus tests. How is this treated? You may be able to stay home or you may need to go to the hospital. Treatment may include: Taking medicines. These may include: Inhalers. These have medicines in them that you breathe in. These may be more of what you already take or they may be new. Steroids. These reduce inflammation in the airways. These may be inhaled, taken by mouth, or given in an IV. Antibiotics. These treat infection. Using oxygen. Using a device to help you clear mucus. Follow these instructions at home: Medicines Take your medicines only as told by your provider. If you were given antibiotics or steroids, take them as told by your provider. Do  not stop taking them even if you start to feel better. Lifestyle Several times a day, wash your hands with soap and water for at least 20 seconds. If you cannot use soap and water, use hand sanitizer. This may help keep you from getting an infection. Avoid being around crowds or people who are sick. Do not smoke or use any products that contain nicotine or tobacco. If you need help quitting, ask your provider. Return to your normal activities when your provider says that it's safe. Use breathing methods to control your stress and catch your breath. How is this prevented? Follow your COPD action plan. The action plan tells you what to do if you're feeling good and what to do when you start feeling worse. Discuss the plan often with your provider. Make sure you get all the shots, also called vaccines, that your provider recommends. Ask your provider about a flu shot and a pneumonia shot. Use oxygen therapy if told by your provider. If you need home oxygen therapy, ask your provider how often to check your oxygen level with a device called an oximeter. Keep all follow-up visits to review your COPD action plan. Your provider will want to check on your condition often to keep you healthy and out of the hospital. Contact a health care provider if: Your COPD symptoms get worse. You have a fever or chills. You have trouble doing daily activities. You have trouble breathing even when you are resting. Get help right away if: You are short of breath and cannot: Talk in full sentences. Do normal activities. You have chest  pain. You feel confused. These symptoms may be an emergency. Call 911 right away. Do not wait to see if the symptoms will go away. Do not drive yourself to the hospital. This information is not intended to replace advice given to you by your health care provider. Make sure you discuss any questions you have with your health care provider. Document Revised: 12/24/2022 Document  Reviewed: 06/08/2022 Elsevier Patient Education  2024 [employer].

## 2024-04-03 ENCOUNTER — Telehealth: Payer: Self-pay

## 2024-04-03 NOTE — Telephone Encounter (Signed)
 Copied from CRM #[id number]. Topic: Clinical - Medical Advice >> Apr 03, 2024 11:26 AM Corin V wrote: Reason for CRM: Patient is still coughing up phlegm and wants to know if additional days of the antibiotic can be called in to help ensure the infection is cleared up. She is improving, but symptoms are just not cleared up. Callback: [phone]

## 2024-04-04 NOTE — Telephone Encounter (Signed)
 LM for patient with recommendations and comments

## 2024-05-23 ENCOUNTER — Ambulatory Visit: Admitting: Family Medicine

## 2024-06-26 ENCOUNTER — Ambulatory Visit: Admitting: Cardiology
